# Patient Record
Sex: Female | Born: 1935
Health system: Southern US, Community
[De-identification: ages and names within clinical notes are randomized; demographics above are authoritative.]

## PROBLEM LIST (undated history)

## (undated) DIAGNOSIS — R918 Other nonspecific abnormal finding of lung field: Secondary | ICD-10-CM

## (undated) DIAGNOSIS — E785 Hyperlipidemia, unspecified: Secondary | ICD-10-CM

## (undated) DIAGNOSIS — L089 Local infection of the skin and subcutaneous tissue, unspecified: Secondary | ICD-10-CM

## (undated) DIAGNOSIS — M1990 Unspecified osteoarthritis, unspecified site: Secondary | ICD-10-CM

## (undated) DIAGNOSIS — G8929 Other chronic pain: Secondary | ICD-10-CM

## (undated) DIAGNOSIS — J449 Chronic obstructive pulmonary disease, unspecified: Secondary | ICD-10-CM

## (undated) DIAGNOSIS — K449 Diaphragmatic hernia without obstruction or gangrene: Secondary | ICD-10-CM

## (undated) DIAGNOSIS — L03019 Cellulitis of unspecified finger: Secondary | ICD-10-CM

## (undated) DIAGNOSIS — Z9989 Dependence on other enabling machines and devices: Secondary | ICD-10-CM

## (undated) DIAGNOSIS — F17200 Nicotine dependence, unspecified, uncomplicated: Secondary | ICD-10-CM

## (undated) DIAGNOSIS — I2510 Atherosclerotic heart disease of native coronary artery without angina pectoris: Secondary | ICD-10-CM

## (undated) DIAGNOSIS — B029 Zoster without complications: Secondary | ICD-10-CM

## (undated) DIAGNOSIS — IMO0002 Reserved for concepts with insufficient information to code with codable children: Secondary | ICD-10-CM

## (undated) DIAGNOSIS — D649 Anemia, unspecified: Secondary | ICD-10-CM

## (undated) DIAGNOSIS — R413 Other amnesia: Secondary | ICD-10-CM

## (undated) DIAGNOSIS — M549 Dorsalgia, unspecified: Secondary | ICD-10-CM

## (undated) DIAGNOSIS — K219 Gastro-esophageal reflux disease without esophagitis: Secondary | ICD-10-CM

## (undated) HISTORY — DX: Cellulitis of unspecified finger: L03.019

## (undated) HISTORY — DX: Hyperlipidemia, unspecified: E78.5

## (undated) HISTORY — DX: Unspecified osteoarthritis, unspecified site: M19.90

## (undated) HISTORY — PX: ABDOMINAL HYSTERECTOMY: SHX81

## (undated) HISTORY — DX: Zoster without complications: B02.9

## (undated) HISTORY — DX: Other nonspecific abnormal finding of lung field: R91.8

## (undated) HISTORY — DX: Anemia, unspecified: D64.9

## (undated) HISTORY — DX: Nicotine dependence, unspecified, uncomplicated: F17.200

## (undated) HISTORY — PX: EYE SURGERY: SHX253

## (undated) HISTORY — DX: Diaphragmatic hernia without obstruction or gangrene: K44.9

## (undated) HISTORY — DX: Dorsalgia, unspecified: M54.9

## (undated) HISTORY — PX: MASTECTOMY: SHX3

## (undated) HISTORY — PX: BREAST SURGERY: SHX581

## (undated) HISTORY — DX: Other chronic pain: G89.29

## (undated) HISTORY — PX: COMBINED HYSTERECTOMY ABDOMINAL W/ A&P REPAIR / OOPHORECTOMY: SUR292

## (undated) HISTORY — DX: Gastro-esophageal reflux disease without esophagitis: K21.9

## (undated) HISTORY — PX: CHOLECYSTECTOMY: SHX55

## (undated) HISTORY — DX: Reserved for concepts with insufficient information to code with codable children: IMO0002

## (undated) HISTORY — DX: Local infection of the skin and subcutaneous tissue, unspecified: L08.9

## (undated) HISTORY — DX: Chronic obstructive pulmonary disease, unspecified: J44.9

---

## 1999-01-30 LAB — HM COLONOSCOPY: HM Colonoscopy: ABNORMAL

## 2000-05-11 ENCOUNTER — Encounter: Admission: RE | Admit: 2000-05-11 | Discharge: 2000-05-11 | Payer: Self-pay | Admitting: Oncology

## 2000-05-13 ENCOUNTER — Encounter: Payer: Self-pay | Admitting: General Surgery

## 2000-05-14 ENCOUNTER — Ambulatory Visit (HOSPITAL_COMMUNITY): Admission: RE | Admit: 2000-05-14 | Discharge: 2000-05-14 | Payer: Self-pay | Admitting: General Surgery

## 2000-07-30 ENCOUNTER — Ambulatory Visit (HOSPITAL_COMMUNITY): Admission: RE | Admit: 2000-07-30 | Discharge: 2000-07-30 | Payer: Self-pay | Admitting: Family Medicine

## 2000-07-30 ENCOUNTER — Encounter: Payer: Self-pay | Admitting: Family Medicine

## 2001-03-22 ENCOUNTER — Encounter: Payer: Self-pay | Admitting: Family Medicine

## 2001-03-22 ENCOUNTER — Ambulatory Visit (HOSPITAL_COMMUNITY): Admission: RE | Admit: 2001-03-22 | Discharge: 2001-03-22 | Payer: Self-pay | Admitting: Family Medicine

## 2001-05-10 ENCOUNTER — Encounter (HOSPITAL_COMMUNITY): Admission: RE | Admit: 2001-05-10 | Discharge: 2001-06-09 | Payer: Self-pay | Admitting: Oncology

## 2001-05-10 ENCOUNTER — Encounter: Admission: RE | Admit: 2001-05-10 | Discharge: 2001-05-10 | Payer: Self-pay | Admitting: Oncology

## 2001-11-22 ENCOUNTER — Ambulatory Visit (HOSPITAL_COMMUNITY): Admission: RE | Admit: 2001-11-22 | Discharge: 2001-11-22 | Payer: Self-pay | Admitting: Family Medicine

## 2001-11-22 ENCOUNTER — Encounter: Payer: Self-pay | Admitting: Family Medicine

## 2002-03-23 ENCOUNTER — Encounter: Payer: Self-pay | Admitting: Family Medicine

## 2002-03-23 ENCOUNTER — Ambulatory Visit (HOSPITAL_COMMUNITY): Admission: RE | Admit: 2002-03-23 | Discharge: 2002-03-23 | Payer: Self-pay | Admitting: Family Medicine

## 2002-05-10 ENCOUNTER — Encounter: Admission: RE | Admit: 2002-05-10 | Discharge: 2002-05-10 | Payer: Self-pay | Admitting: Oncology

## 2002-05-10 ENCOUNTER — Encounter (HOSPITAL_COMMUNITY): Admission: RE | Admit: 2002-05-10 | Discharge: 2002-06-09 | Payer: Self-pay | Admitting: Oncology

## 2002-08-28 ENCOUNTER — Ambulatory Visit: Admission: RE | Admit: 2002-08-28 | Discharge: 2002-08-28 | Payer: Self-pay | Admitting: Orthopedic Surgery

## 2002-08-28 ENCOUNTER — Encounter: Payer: Self-pay | Admitting: Orthopedic Surgery

## 2002-10-04 ENCOUNTER — Encounter: Payer: Self-pay | Admitting: Family Medicine

## 2002-10-04 ENCOUNTER — Ambulatory Visit (HOSPITAL_COMMUNITY): Admission: RE | Admit: 2002-10-04 | Discharge: 2002-10-04 | Payer: Self-pay | Admitting: Family Medicine

## 2002-10-19 ENCOUNTER — Emergency Department (HOSPITAL_COMMUNITY): Admission: EM | Admit: 2002-10-19 | Discharge: 2002-10-19 | Payer: Self-pay | Admitting: Emergency Medicine

## 2003-03-26 ENCOUNTER — Ambulatory Visit (HOSPITAL_COMMUNITY): Admission: RE | Admit: 2003-03-26 | Discharge: 2003-03-26 | Payer: Self-pay | Admitting: Family Medicine

## 2003-05-09 ENCOUNTER — Encounter: Admission: RE | Admit: 2003-05-09 | Discharge: 2003-05-09 | Payer: Self-pay | Admitting: Oncology

## 2003-05-09 ENCOUNTER — Encounter (HOSPITAL_COMMUNITY): Admission: RE | Admit: 2003-05-09 | Discharge: 2003-06-08 | Payer: Self-pay | Admitting: Oncology

## 2003-09-25 ENCOUNTER — Ambulatory Visit (HOSPITAL_COMMUNITY): Admission: RE | Admit: 2003-09-25 | Discharge: 2003-09-25 | Payer: Self-pay | Admitting: Family Medicine

## 2003-11-02 ENCOUNTER — Emergency Department (HOSPITAL_COMMUNITY): Admission: EM | Admit: 2003-11-02 | Discharge: 2003-11-02 | Payer: Self-pay | Admitting: Emergency Medicine

## 2003-11-21 ENCOUNTER — Ambulatory Visit: Payer: Self-pay | Admitting: Orthopedic Surgery

## 2004-02-28 ENCOUNTER — Ambulatory Visit: Payer: Self-pay | Admitting: Family Medicine

## 2004-03-03 ENCOUNTER — Ambulatory Visit: Payer: Self-pay | Admitting: Family Medicine

## 2004-03-24 ENCOUNTER — Ambulatory Visit: Payer: Self-pay | Admitting: Orthopedic Surgery

## 2004-03-24 ENCOUNTER — Ambulatory Visit: Payer: Self-pay | Admitting: Family Medicine

## 2004-06-12 ENCOUNTER — Ambulatory Visit: Payer: Self-pay | Admitting: Family Medicine

## 2004-06-12 ENCOUNTER — Ambulatory Visit (HOSPITAL_COMMUNITY): Admission: RE | Admit: 2004-06-12 | Discharge: 2004-06-12 | Payer: Self-pay | Admitting: Family Medicine

## 2004-06-24 ENCOUNTER — Ambulatory Visit (HOSPITAL_COMMUNITY): Admission: RE | Admit: 2004-06-24 | Discharge: 2004-06-24 | Payer: Self-pay | Admitting: Family Medicine

## 2004-07-02 ENCOUNTER — Ambulatory Visit: Payer: Self-pay | Admitting: Orthopedic Surgery

## 2004-07-11 ENCOUNTER — Ambulatory Visit: Payer: Self-pay | Admitting: Family Medicine

## 2004-09-19 ENCOUNTER — Ambulatory Visit: Payer: Self-pay | Admitting: Family Medicine

## 2004-09-24 ENCOUNTER — Encounter (HOSPITAL_COMMUNITY): Admission: RE | Admit: 2004-09-24 | Discharge: 2004-10-11 | Payer: Self-pay | Admitting: Oncology

## 2004-09-24 ENCOUNTER — Ambulatory Visit (HOSPITAL_COMMUNITY): Payer: Self-pay | Admitting: Oncology

## 2004-09-24 ENCOUNTER — Encounter: Admission: RE | Admit: 2004-09-24 | Discharge: 2004-10-11 | Payer: Self-pay | Admitting: Oncology

## 2004-09-25 ENCOUNTER — Ambulatory Visit (HOSPITAL_COMMUNITY): Admission: RE | Admit: 2004-09-25 | Discharge: 2004-09-25 | Payer: Self-pay | Admitting: Family Medicine

## 2004-10-02 ENCOUNTER — Ambulatory Visit: Payer: Self-pay | Admitting: Orthopedic Surgery

## 2004-10-23 ENCOUNTER — Ambulatory Visit: Payer: Self-pay | Admitting: Family Medicine

## 2005-01-01 ENCOUNTER — Ambulatory Visit: Payer: Self-pay | Admitting: Orthopedic Surgery

## 2005-01-07 ENCOUNTER — Encounter: Payer: Self-pay | Admitting: Emergency Medicine

## 2005-01-07 ENCOUNTER — Ambulatory Visit: Payer: Self-pay | Admitting: Cardiology

## 2005-01-07 ENCOUNTER — Inpatient Hospital Stay (HOSPITAL_COMMUNITY): Admission: EM | Admit: 2005-01-07 | Discharge: 2005-01-09 | Payer: Self-pay | Admitting: Emergency Medicine

## 2005-01-09 ENCOUNTER — Ambulatory Visit: Payer: Self-pay | Admitting: Cardiology

## 2005-01-09 ENCOUNTER — Encounter: Payer: Self-pay | Admitting: Cardiology

## 2005-01-13 ENCOUNTER — Ambulatory Visit: Payer: Self-pay | Admitting: Family Medicine

## 2005-02-12 ENCOUNTER — Ambulatory Visit: Payer: Self-pay | Admitting: Orthopedic Surgery

## 2005-02-23 ENCOUNTER — Ambulatory Visit: Payer: Self-pay | Admitting: Family Medicine

## 2005-06-10 ENCOUNTER — Ambulatory Visit: Payer: Self-pay | Admitting: Family Medicine

## 2005-06-26 ENCOUNTER — Ambulatory Visit (HOSPITAL_COMMUNITY): Admission: RE | Admit: 2005-06-26 | Discharge: 2005-06-26 | Payer: Self-pay | Admitting: Family Medicine

## 2005-07-29 ENCOUNTER — Ambulatory Visit (HOSPITAL_COMMUNITY): Admission: RE | Admit: 2005-07-29 | Discharge: 2005-07-29 | Payer: Self-pay | Admitting: Family Medicine

## 2005-08-04 ENCOUNTER — Encounter: Admission: RE | Admit: 2005-08-04 | Discharge: 2005-08-04 | Payer: Self-pay | Admitting: Family Medicine

## 2005-08-04 ENCOUNTER — Encounter (INDEPENDENT_AMBULATORY_CARE_PROVIDER_SITE_OTHER): Payer: Self-pay | Admitting: Specialist

## 2005-08-04 ENCOUNTER — Encounter (INDEPENDENT_AMBULATORY_CARE_PROVIDER_SITE_OTHER): Payer: Self-pay | Admitting: Diagnostic Radiology

## 2005-08-18 ENCOUNTER — Ambulatory Visit (HOSPITAL_COMMUNITY): Admission: RE | Admit: 2005-08-18 | Discharge: 2005-08-18 | Payer: Self-pay | Admitting: Family Medicine

## 2005-09-02 ENCOUNTER — Ambulatory Visit: Payer: Self-pay | Admitting: Family Medicine

## 2005-09-09 ENCOUNTER — Inpatient Hospital Stay (HOSPITAL_COMMUNITY): Admission: RE | Admit: 2005-09-09 | Discharge: 2005-09-10 | Payer: Self-pay | Admitting: General Surgery

## 2005-09-09 ENCOUNTER — Encounter (INDEPENDENT_AMBULATORY_CARE_PROVIDER_SITE_OTHER): Payer: Self-pay | Admitting: Specialist

## 2005-09-23 ENCOUNTER — Encounter (HOSPITAL_COMMUNITY): Admission: RE | Admit: 2005-09-23 | Discharge: 2005-10-09 | Payer: Self-pay | Admitting: Oncology

## 2005-09-23 ENCOUNTER — Encounter: Admission: RE | Admit: 2005-09-23 | Discharge: 2005-10-09 | Payer: Self-pay | Admitting: Oncology

## 2005-09-23 ENCOUNTER — Ambulatory Visit (HOSPITAL_COMMUNITY): Payer: Self-pay | Admitting: Oncology

## 2005-10-12 ENCOUNTER — Encounter: Payer: Self-pay | Admitting: Family Medicine

## 2005-10-12 LAB — CONVERTED CEMR LAB: Pap Smear: NORMAL

## 2005-10-14 ENCOUNTER — Other Ambulatory Visit: Admission: RE | Admit: 2005-10-14 | Discharge: 2005-10-14 | Payer: Self-pay | Admitting: Family Medicine

## 2005-10-14 ENCOUNTER — Ambulatory Visit: Payer: Self-pay | Admitting: Family Medicine

## 2005-11-23 ENCOUNTER — Encounter: Admission: RE | Admit: 2005-11-23 | Discharge: 2005-11-23 | Payer: Self-pay | Admitting: Oncology

## 2006-01-12 DIAGNOSIS — I2510 Atherosclerotic heart disease of native coronary artery without angina pectoris: Secondary | ICD-10-CM

## 2006-01-12 HISTORY — PX: CARDIAC CATHETERIZATION: SHX172

## 2006-01-12 HISTORY — DX: Atherosclerotic heart disease of native coronary artery without angina pectoris: I25.10

## 2006-01-15 ENCOUNTER — Ambulatory Visit: Payer: Self-pay | Admitting: Family Medicine

## 2006-01-23 ENCOUNTER — Encounter: Payer: Self-pay | Admitting: Orthopedic Surgery

## 2006-01-27 ENCOUNTER — Ambulatory Visit: Payer: Self-pay | Admitting: Family Medicine

## 2006-01-27 ENCOUNTER — Ambulatory Visit (HOSPITAL_COMMUNITY): Admission: RE | Admit: 2006-01-27 | Discharge: 2006-01-27 | Payer: Self-pay | Admitting: Family Medicine

## 2006-01-28 ENCOUNTER — Encounter: Payer: Self-pay | Admitting: Family Medicine

## 2006-01-28 LAB — CONVERTED CEMR LAB
Ketones, ur: NEGATIVE mg/dL
Leukocytes, UA: NEGATIVE
Protein, ur: NEGATIVE mg/dL
Specific Gravity, Urine: 1.031 (ref 1.005–1.03)
Urobilinogen, UA: 0.2 (ref 0.0–1.0)
pH: 5.5 (ref 5.0–8.0)

## 2006-02-02 ENCOUNTER — Ambulatory Visit: Payer: Self-pay | Admitting: Cardiovascular Disease

## 2006-02-05 ENCOUNTER — Ambulatory Visit (HOSPITAL_COMMUNITY): Admission: RE | Admit: 2006-02-05 | Discharge: 2006-02-05 | Payer: Self-pay | Admitting: Cardiovascular Disease

## 2006-02-12 ENCOUNTER — Ambulatory Visit (HOSPITAL_COMMUNITY): Admission: RE | Admit: 2006-02-12 | Discharge: 2006-02-12 | Payer: Self-pay | Admitting: Pulmonary Disease

## 2006-03-16 ENCOUNTER — Ambulatory Visit (HOSPITAL_COMMUNITY): Admission: RE | Admit: 2006-03-16 | Discharge: 2006-03-16 | Payer: Self-pay | Admitting: Family Medicine

## 2006-03-16 ENCOUNTER — Ambulatory Visit: Payer: Self-pay | Admitting: Family Medicine

## 2006-03-16 LAB — CONVERTED CEMR LAB
ALT: 16 units/L (ref 0–35)
BUN: 20 mg/dL (ref 6–23)
Basophils Absolute: 0 10*3/uL (ref 0.0–0.1)
Basophils Relative: 1 % (ref 0–1)
Bilirubin, Direct: 0.1 mg/dL (ref 0.0–0.3)
Chloride: 106 meq/L (ref 96–112)
Eosinophils Absolute: 0.2 10*3/uL (ref 0.0–0.7)
INR: 1 (ref 0.0–1.5)
Indirect Bilirubin: 0.3 mg/dL (ref 0.0–0.9)
Lymphocytes Relative: 43 % (ref 12–46)
MCV: 98.1 fL (ref 78.0–100.0)
Monocytes Absolute: 0.4 10*3/uL (ref 0.2–0.7)
Monocytes Relative: 10 % (ref 3–11)
Neutrophils Relative %: 41 % — ABNORMAL LOW (ref 43–77)
Potassium: 4.1 meq/L (ref 3.5–5.3)
RDW: 15.5 % — ABNORMAL HIGH (ref 11.5–14.0)
Sodium: 140 meq/L (ref 135–145)
Total Protein: 6.4 g/dL (ref 6.0–8.3)

## 2006-04-13 HISTORY — PX: TOTAL KNEE ARTHROPLASTY: SHX125

## 2006-06-21 ENCOUNTER — Ambulatory Visit: Payer: Self-pay | Admitting: Family Medicine

## 2006-06-21 LAB — CONVERTED CEMR LAB
Basophils Relative: 0 % (ref 0–1)
Chloride: 106 meq/L (ref 96–112)
Creatinine, Ser: 0.77 mg/dL (ref 0.40–1.20)
Eosinophils Absolute: 0.2 10*3/uL (ref 0.0–0.7)
Glucose, Bld: 85 mg/dL (ref 70–99)
Lymphs Abs: 2.2 10*3/uL (ref 0.7–3.3)
Monocytes Absolute: 0.5 10*3/uL (ref 0.2–0.7)
Monocytes Relative: 10 % (ref 3–11)
Sodium: 144 meq/L (ref 135–145)
WBC: 5.1 10*3/uL (ref 4.0–10.5)

## 2006-06-23 ENCOUNTER — Encounter: Payer: Self-pay | Admitting: Family Medicine

## 2006-06-23 ENCOUNTER — Encounter (HOSPITAL_COMMUNITY): Admission: RE | Admit: 2006-06-23 | Discharge: 2006-07-23 | Payer: Self-pay | Admitting: Family Medicine

## 2006-07-01 ENCOUNTER — Ambulatory Visit: Payer: Self-pay | Admitting: Orthopedic Surgery

## 2006-07-05 ENCOUNTER — Ambulatory Visit: Payer: Self-pay | Admitting: Family Medicine

## 2006-07-12 ENCOUNTER — Emergency Department (HOSPITAL_COMMUNITY): Admission: EM | Admit: 2006-07-12 | Discharge: 2006-07-12 | Payer: Self-pay | Admitting: Emergency Medicine

## 2006-07-15 ENCOUNTER — Ambulatory Visit: Payer: Self-pay | Admitting: Orthopedic Surgery

## 2006-07-21 ENCOUNTER — Ambulatory Visit: Payer: Self-pay | Admitting: Family Medicine

## 2006-07-26 ENCOUNTER — Ambulatory Visit: Payer: Self-pay | Admitting: Orthopedic Surgery

## 2006-07-27 ENCOUNTER — Encounter (HOSPITAL_COMMUNITY): Admission: RE | Admit: 2006-07-27 | Discharge: 2006-08-26 | Payer: Self-pay | Admitting: Family Medicine

## 2006-08-02 ENCOUNTER — Ambulatory Visit: Payer: Self-pay | Admitting: Family Medicine

## 2006-08-16 ENCOUNTER — Ambulatory Visit: Payer: Self-pay | Admitting: Orthopedic Surgery

## 2006-08-19 ENCOUNTER — Ambulatory Visit: Payer: Self-pay | Admitting: Family Medicine

## 2006-09-01 ENCOUNTER — Ambulatory Visit: Payer: Self-pay | Admitting: Family Medicine

## 2006-09-28 ENCOUNTER — Ambulatory Visit: Payer: Self-pay | Admitting: Family Medicine

## 2006-09-28 LAB — CONVERTED CEMR LAB
Albumin: 4 g/dL (ref 3.5–5.2)
Basophils Absolute: 0 10*3/uL (ref 0.0–0.1)
CO2: 27 meq/L (ref 19–32)
Chloride: 98 meq/L (ref 96–112)
Cholesterol: 194 mg/dL (ref 0–200)
Creatinine, Ser: 0.83 mg/dL (ref 0.40–1.20)
Hemoglobin: 14.1 g/dL (ref 12.0–15.0)
Lymphocytes Relative: 34 % (ref 12–46)
Lymphs Abs: 1.6 10*3/uL (ref 0.7–3.3)
MCHC: 32.1 g/dL (ref 30.0–36.0)
MCV: 93.6 fL (ref 78.0–100.0)
Monocytes Absolute: 0.5 10*3/uL (ref 0.2–0.7)
Monocytes Relative: 11 % (ref 3–11)
Neutro Abs: 2.6 10*3/uL (ref 1.7–7.7)
Neutrophils Relative %: 53 % (ref 43–77)
Platelets: 277 10*3/uL (ref 150–400)
Potassium: 3.7 meq/L (ref 3.5–5.3)
RBC: 4.69 M/uL (ref 3.87–5.11)
Sodium: 139 meq/L (ref 135–145)
Triglycerides: 94 mg/dL (ref <150)
VLDL: 19 mg/dL (ref 0–40)

## 2006-10-26 ENCOUNTER — Ambulatory Visit: Payer: Self-pay | Admitting: Family Medicine

## 2006-11-03 ENCOUNTER — Ambulatory Visit: Payer: Self-pay | Admitting: Cardiology

## 2006-11-24 ENCOUNTER — Ambulatory Visit: Payer: Self-pay | Admitting: Cardiovascular Disease

## 2006-11-25 ENCOUNTER — Ambulatory Visit: Payer: Self-pay | Admitting: Cardiology

## 2006-11-26 ENCOUNTER — Encounter (HOSPITAL_COMMUNITY): Admission: RE | Admit: 2006-11-26 | Discharge: 2006-12-26 | Payer: Self-pay | Admitting: Cardiovascular Disease

## 2006-12-14 ENCOUNTER — Ambulatory Visit: Payer: Self-pay | Admitting: Cardiovascular Disease

## 2006-12-21 ENCOUNTER — Ambulatory Visit: Payer: Self-pay | Admitting: Family Medicine

## 2006-12-22 ENCOUNTER — Emergency Department (HOSPITAL_COMMUNITY): Admission: EM | Admit: 2006-12-22 | Discharge: 2006-12-22 | Payer: Self-pay | Admitting: Emergency Medicine

## 2007-01-03 ENCOUNTER — Ambulatory Visit: Payer: Self-pay | Admitting: Family Medicine

## 2007-01-19 ENCOUNTER — Telehealth: Payer: Self-pay | Admitting: Orthopedic Surgery

## 2007-01-19 ENCOUNTER — Ambulatory Visit: Payer: Self-pay | Admitting: Orthopedic Surgery

## 2007-01-19 DIAGNOSIS — M1710 Unilateral primary osteoarthritis, unspecified knee: Secondary | ICD-10-CM | POA: Insufficient documentation

## 2007-01-25 ENCOUNTER — Encounter: Payer: Self-pay | Admitting: Family Medicine

## 2007-01-25 DIAGNOSIS — M1990 Unspecified osteoarthritis, unspecified site: Secondary | ICD-10-CM

## 2007-01-25 DIAGNOSIS — F17200 Nicotine dependence, unspecified, uncomplicated: Secondary | ICD-10-CM | POA: Insufficient documentation

## 2007-01-25 DIAGNOSIS — K449 Diaphragmatic hernia without obstruction or gangrene: Secondary | ICD-10-CM | POA: Insufficient documentation

## 2007-01-25 DIAGNOSIS — Z853 Personal history of malignant neoplasm of breast: Secondary | ICD-10-CM | POA: Insufficient documentation

## 2007-01-25 DIAGNOSIS — J4489 Other specified chronic obstructive pulmonary disease: Secondary | ICD-10-CM | POA: Insufficient documentation

## 2007-01-25 DIAGNOSIS — Z87898 Personal history of other specified conditions: Secondary | ICD-10-CM | POA: Insufficient documentation

## 2007-01-25 DIAGNOSIS — I2510 Atherosclerotic heart disease of native coronary artery without angina pectoris: Secondary | ICD-10-CM

## 2007-01-25 DIAGNOSIS — J449 Chronic obstructive pulmonary disease, unspecified: Secondary | ICD-10-CM | POA: Insufficient documentation

## 2007-01-25 LAB — CONVERTED CEMR LAB: WBC Urine, dipstick: NEGATIVE

## 2007-02-02 ENCOUNTER — Ambulatory Visit: Payer: Self-pay | Admitting: Family Medicine

## 2007-02-07 ENCOUNTER — Ambulatory Visit: Payer: Self-pay | Admitting: Cardiovascular Disease

## 2007-03-21 ENCOUNTER — Ambulatory Visit (HOSPITAL_COMMUNITY): Admission: RE | Admit: 2007-03-21 | Discharge: 2007-03-21 | Payer: Self-pay | Admitting: Family Medicine

## 2007-03-21 ENCOUNTER — Ambulatory Visit: Payer: Self-pay | Admitting: Family Medicine

## 2007-03-24 ENCOUNTER — Ambulatory Visit (HOSPITAL_COMMUNITY): Admission: RE | Admit: 2007-03-24 | Discharge: 2007-03-24 | Payer: Self-pay | Admitting: Family Medicine

## 2007-03-24 ENCOUNTER — Ambulatory Visit: Payer: Self-pay | Admitting: Orthopedic Surgery

## 2007-04-26 ENCOUNTER — Ambulatory Visit: Payer: Self-pay | Admitting: Family Medicine

## 2007-05-02 ENCOUNTER — Encounter: Payer: Self-pay | Admitting: Family Medicine

## 2007-05-20 ENCOUNTER — Ambulatory Visit: Payer: Self-pay | Admitting: Family Medicine

## 2007-06-05 ENCOUNTER — Encounter: Payer: Self-pay | Admitting: Family Medicine

## 2007-06-07 ENCOUNTER — Encounter: Payer: Self-pay | Admitting: Orthopedic Surgery

## 2007-06-09 ENCOUNTER — Ambulatory Visit: Payer: Self-pay | Admitting: Orthopedic Surgery

## 2007-06-09 DIAGNOSIS — IMO0002 Reserved for concepts with insufficient information to code with codable children: Secondary | ICD-10-CM | POA: Insufficient documentation

## 2007-06-09 DIAGNOSIS — M4800 Spinal stenosis, site unspecified: Secondary | ICD-10-CM | POA: Insufficient documentation

## 2007-06-09 DIAGNOSIS — M25569 Pain in unspecified knee: Secondary | ICD-10-CM

## 2007-07-27 ENCOUNTER — Ambulatory Visit: Payer: Self-pay | Admitting: Orthopedic Surgery

## 2007-08-02 ENCOUNTER — Ambulatory Visit: Payer: Self-pay | Admitting: Family Medicine

## 2007-08-02 LAB — CONVERTED CEMR LAB
BUN: 13 mg/dL (ref 6–23)
Basophils Absolute: 0 10*3/uL (ref 0.0–0.1)
Basophils Relative: 0 % (ref 0–1)
CO2: 28 meq/L (ref 19–32)
Calcium: 8.8 mg/dL (ref 8.4–10.5)
Chloride: 104 meq/L (ref 96–112)
Cholesterol: 170 mg/dL (ref 0–200)
Creatinine, Ser: 0.63 mg/dL (ref 0.40–1.20)
Eosinophils Absolute: 0.1 10*3/uL (ref 0.0–0.7)
Eosinophils Relative: 2 % (ref 0–5)
HCT: 43.2 % (ref 36.0–46.0)
Hemoglobin: 13.4 g/dL (ref 12.0–15.0)
Lymphocytes Relative: 36 % (ref 12–46)
Monocytes Absolute: 0.5 10*3/uL (ref 0.1–1.0)
Monocytes Relative: 11 % (ref 3–12)
Neutro Abs: 2.1 10*3/uL (ref 1.7–7.7)
Neutrophils Relative %: 50 % (ref 43–77)
Potassium: 4.4 meq/L (ref 3.5–5.3)
RBC: 4.46 M/uL (ref 3.87–5.11)
RDW: 15.4 % (ref 11.5–15.5)
Sodium: 143 meq/L (ref 135–145)
TSH: 0.97 microintl units/mL (ref 0.350–4.50)
VLDL: 23 mg/dL (ref 0–40)
WBC: 4.2 10*3/uL (ref 4.0–10.5)

## 2007-08-08 ENCOUNTER — Ambulatory Visit (HOSPITAL_COMMUNITY): Admission: RE | Admit: 2007-08-08 | Discharge: 2007-08-08 | Payer: Self-pay | Admitting: Family Medicine

## 2007-08-10 ENCOUNTER — Telehealth: Payer: Self-pay | Admitting: Family Medicine

## 2007-09-05 ENCOUNTER — Ambulatory Visit: Payer: Self-pay | Admitting: Orthopedic Surgery

## 2007-10-11 ENCOUNTER — Ambulatory Visit: Payer: Self-pay | Admitting: Family Medicine

## 2007-10-25 ENCOUNTER — Telehealth: Payer: Self-pay | Admitting: Orthopedic Surgery

## 2007-10-31 ENCOUNTER — Ambulatory Visit: Payer: Self-pay | Admitting: Family Medicine

## 2007-11-10 ENCOUNTER — Ambulatory Visit: Payer: Self-pay | Admitting: Family Medicine

## 2007-12-28 ENCOUNTER — Ambulatory Visit: Payer: Self-pay | Admitting: Orthopedic Surgery

## 2008-01-10 ENCOUNTER — Encounter (INDEPENDENT_AMBULATORY_CARE_PROVIDER_SITE_OTHER): Payer: Self-pay | Admitting: *Deleted

## 2008-02-02 ENCOUNTER — Encounter: Payer: Self-pay | Admitting: Family Medicine

## 2008-02-22 ENCOUNTER — Ambulatory Visit: Payer: Self-pay | Admitting: Orthopedic Surgery

## 2008-02-23 ENCOUNTER — Ambulatory Visit: Payer: Self-pay | Admitting: Family Medicine

## 2008-02-24 ENCOUNTER — Ambulatory Visit (HOSPITAL_COMMUNITY): Admission: RE | Admit: 2008-02-24 | Discharge: 2008-02-24 | Payer: Self-pay | Admitting: Family Medicine

## 2008-02-24 ENCOUNTER — Encounter: Payer: Self-pay | Admitting: Family Medicine

## 2008-02-24 LAB — CONVERTED CEMR LAB
ALT: 329 units/L — ABNORMAL HIGH (ref 0–35)
Basophils Absolute: 0 10*3/uL (ref 0.0–0.1)
Basophils Relative: 0 % (ref 0–1)
Bilirubin, Direct: 0.1 mg/dL (ref 0.0–0.3)
Eosinophils Absolute: 0.1 10*3/uL (ref 0.0–0.7)
Eosinophils Relative: 2 % (ref 0–5)
HDL: 88 mg/dL (ref >39)
Hemoglobin: 12.7 g/dL (ref 12.0–15.0)
Indirect Bilirubin: 0.4 mg/dL (ref 0.0–0.9)
Lymphs Abs: 1.2 10*3/uL (ref 0.7–4.0)
MCV: 94.7 fL (ref 78.0–100.0)
Monocytes Relative: 8 % (ref 3–12)
RBC: 4.13 M/uL (ref 3.87–5.11)
TSH: 1.152 microintl units/mL (ref 0.350–4.50)
Total Bilirubin: 0.5 mg/dL (ref 0.3–1.2)
Total CHOL/HDL Ratio: 1.9

## 2008-02-28 LAB — CONVERTED CEMR LAB
Hep A IgM: NEGATIVE
Hep B C IgM: NEGATIVE
Hepatitis B Surface Ag: NEGATIVE

## 2008-02-29 ENCOUNTER — Ambulatory Visit (HOSPITAL_COMMUNITY): Admission: RE | Admit: 2008-02-29 | Discharge: 2008-02-29 | Payer: Self-pay | Admitting: Family Medicine

## 2008-03-05 ENCOUNTER — Telehealth: Payer: Self-pay | Admitting: Family Medicine

## 2008-03-22 ENCOUNTER — Encounter: Payer: Self-pay | Admitting: Family Medicine

## 2008-03-29 ENCOUNTER — Encounter: Payer: Self-pay | Admitting: Family Medicine

## 2008-03-30 ENCOUNTER — Encounter: Payer: Self-pay | Admitting: Family Medicine

## 2008-04-02 ENCOUNTER — Encounter: Payer: Self-pay | Admitting: Family Medicine

## 2008-04-06 ENCOUNTER — Ambulatory Visit: Payer: Self-pay | Admitting: Family Medicine

## 2008-05-02 ENCOUNTER — Ambulatory Visit: Payer: Self-pay | Admitting: Family Medicine

## 2008-05-02 LAB — CONVERTED CEMR LAB
Blood in Urine, dipstick: NEGATIVE
Nitrite: NEGATIVE
Protein, U semiquant: NEGATIVE
Specific Gravity, Urine: 1.01
WBC Urine, dipstick: NEGATIVE
pH: 6

## 2008-07-03 ENCOUNTER — Ambulatory Visit: Payer: Self-pay | Admitting: Family Medicine

## 2008-07-03 DIAGNOSIS — K759 Inflammatory liver disease, unspecified: Secondary | ICD-10-CM | POA: Insufficient documentation

## 2008-07-03 LAB — CONVERTED CEMR LAB: Troponin I: 0.01 ng/mL (ref <0.06)

## 2008-07-04 ENCOUNTER — Encounter: Payer: Self-pay | Admitting: Family Medicine

## 2008-07-04 LAB — CONVERTED CEMR LAB
ALT: 18 units/L (ref 0–35)
AST: 23 units/L (ref 0–37)
Bilirubin, Direct: 0.1 mg/dL (ref 0.0–0.3)
Indirect Bilirubin: 0.3 mg/dL (ref 0.0–0.9)

## 2008-07-05 ENCOUNTER — Ambulatory Visit: Payer: Self-pay | Admitting: Orthopedic Surgery

## 2008-07-05 ENCOUNTER — Telehealth: Payer: Self-pay | Admitting: Family Medicine

## 2008-08-23 ENCOUNTER — Ambulatory Visit: Payer: Self-pay | Admitting: Orthopedic Surgery

## 2008-08-23 DIAGNOSIS — M19079 Primary osteoarthritis, unspecified ankle and foot: Secondary | ICD-10-CM | POA: Insufficient documentation

## 2008-09-03 ENCOUNTER — Encounter: Payer: Self-pay | Admitting: Orthopedic Surgery

## 2008-09-03 ENCOUNTER — Telehealth: Payer: Self-pay | Admitting: Family Medicine

## 2008-09-03 ENCOUNTER — Encounter: Payer: Self-pay | Admitting: Family Medicine

## 2008-09-04 ENCOUNTER — Ambulatory Visit: Payer: Self-pay | Admitting: Family Medicine

## 2008-10-18 ENCOUNTER — Ambulatory Visit: Payer: Self-pay | Admitting: Family Medicine

## 2008-11-28 ENCOUNTER — Ambulatory Visit: Payer: Self-pay | Admitting: Orthopedic Surgery

## 2008-12-24 ENCOUNTER — Ambulatory Visit: Payer: Self-pay | Admitting: Family Medicine

## 2008-12-27 ENCOUNTER — Emergency Department (HOSPITAL_COMMUNITY): Admission: EM | Admit: 2008-12-27 | Discharge: 2008-12-27 | Payer: Self-pay | Admitting: Emergency Medicine

## 2008-12-28 ENCOUNTER — Telehealth: Payer: Self-pay | Admitting: Family Medicine

## 2008-12-28 ENCOUNTER — Emergency Department (HOSPITAL_COMMUNITY): Admission: EM | Admit: 2008-12-28 | Discharge: 2008-12-28 | Payer: Self-pay | Admitting: Emergency Medicine

## 2008-12-31 LAB — CONVERTED CEMR LAB
ALT: 10 units/L (ref 0–35)
AST: 15 units/L (ref 0–37)
Albumin: 3.5 g/dL (ref 3.5–5.2)
Bilirubin, Direct: 0.1 mg/dL (ref 0.0–0.3)
Helicobacter Pylori Antibody-IgG: <0.4
Total Protein: 6.4 g/dL (ref 6.0–8.3)

## 2009-01-01 ENCOUNTER — Telehealth: Payer: Self-pay | Admitting: Family Medicine

## 2009-01-01 ENCOUNTER — Ambulatory Visit: Payer: Self-pay | Admitting: Family Medicine

## 2009-01-02 ENCOUNTER — Ambulatory Visit: Payer: Self-pay | Admitting: Cardiology

## 2009-01-02 DIAGNOSIS — K219 Gastro-esophageal reflux disease without esophagitis: Secondary | ICD-10-CM

## 2009-01-07 ENCOUNTER — Encounter: Payer: Self-pay | Admitting: Family Medicine

## 2009-01-10 ENCOUNTER — Telehealth: Payer: Self-pay | Admitting: Family Medicine

## 2009-01-12 DIAGNOSIS — M549 Dorsalgia, unspecified: Secondary | ICD-10-CM

## 2009-01-14 ENCOUNTER — Telehealth: Payer: Self-pay | Admitting: Family Medicine

## 2009-01-15 ENCOUNTER — Ambulatory Visit (HOSPITAL_COMMUNITY): Admission: RE | Admit: 2009-01-15 | Discharge: 2009-01-15 | Payer: Self-pay | Admitting: Family Medicine

## 2009-01-15 ENCOUNTER — Ambulatory Visit (HOSPITAL_COMMUNITY): Admission: RE | Admit: 2009-01-15 | Discharge: 2009-01-15 | Payer: Self-pay | Admitting: Cardiology

## 2009-01-15 ENCOUNTER — Telehealth: Payer: Self-pay | Admitting: Family Medicine

## 2009-01-21 ENCOUNTER — Telehealth: Payer: Self-pay | Admitting: Family Medicine

## 2009-01-31 ENCOUNTER — Ambulatory Visit: Payer: Self-pay | Admitting: Family Medicine

## 2009-01-31 ENCOUNTER — Encounter (INDEPENDENT_AMBULATORY_CARE_PROVIDER_SITE_OTHER): Payer: Self-pay | Admitting: *Deleted

## 2009-02-04 ENCOUNTER — Encounter: Payer: Self-pay | Admitting: Family Medicine

## 2009-02-28 ENCOUNTER — Ambulatory Visit: Payer: Self-pay | Admitting: Orthopedic Surgery

## 2009-03-08 ENCOUNTER — Telehealth: Payer: Self-pay | Admitting: Family Medicine

## 2009-05-15 ENCOUNTER — Ambulatory Visit: Payer: Self-pay | Admitting: Orthopedic Surgery

## 2009-06-03 ENCOUNTER — Encounter: Payer: Self-pay | Admitting: Family Medicine

## 2009-06-04 ENCOUNTER — Ambulatory Visit (HOSPITAL_COMMUNITY): Admission: RE | Admit: 2009-06-04 | Discharge: 2009-06-04 | Payer: Self-pay | Admitting: Pulmonary Disease

## 2009-06-17 ENCOUNTER — Ambulatory Visit: Payer: Self-pay | Admitting: Family Medicine

## 2009-06-17 ENCOUNTER — Encounter: Payer: Self-pay | Admitting: Cardiology

## 2009-06-17 DIAGNOSIS — R5383 Other fatigue: Secondary | ICD-10-CM

## 2009-06-17 DIAGNOSIS — R5381 Other malaise: Secondary | ICD-10-CM

## 2009-06-18 ENCOUNTER — Encounter: Payer: Self-pay | Admitting: Family Medicine

## 2009-06-18 LAB — CONVERTED CEMR LAB
Albumin: 3.7 g/dL (ref 3.5–5.2)
BUN: 20 mg/dL (ref 6–23)
Basophils Absolute: 0 10*3/uL (ref 0.0–0.1)
CO2: 28 meq/L (ref 19–32)
Chloride: 106 meq/L (ref 96–112)
Cholesterol: 154 mg/dL (ref 0–200)
Creatinine, Ser: 0.69 mg/dL (ref 0.40–1.20)
Glucose, Bld: 80 mg/dL (ref 70–99)
HCT: 40.6 % (ref 36.0–46.0)
Hemoglobin: 12.7 g/dL (ref 12.0–15.0)
Lymphs Abs: 1.7 10*3/uL (ref 0.7–4.0)
Monocytes Absolute: 0.4 10*3/uL (ref 0.1–1.0)
Monocytes Relative: 9 % (ref 3–12)
Neutro Abs: 1.8 10*3/uL (ref 1.7–7.7)
TSH: 1.174 microintl units/mL (ref 0.350–4.500)
Triglycerides: 81 mg/dL (ref <150)

## 2009-06-20 ENCOUNTER — Telehealth: Payer: Self-pay | Admitting: Family Medicine

## 2009-07-10 ENCOUNTER — Ambulatory Visit: Payer: Self-pay | Admitting: Orthopedic Surgery

## 2009-07-29 ENCOUNTER — Ambulatory Visit: Payer: Self-pay | Admitting: Family Medicine

## 2009-08-02 ENCOUNTER — Encounter (INDEPENDENT_AMBULATORY_CARE_PROVIDER_SITE_OTHER): Payer: Self-pay | Admitting: *Deleted

## 2009-08-02 LAB — CONVERTED CEMR LAB
Alkaline Phosphatase: 83 units/L
BUN: 20 mg/dL
Bilirubin, Direct: 0.1 mg/dL
CO2: 28 meq/L
Chloride: 106 meq/L
Creatinine, Ser: 0.69 mg/dL
Glucose, Bld: 80 mg/dL
Hemoglobin: 12.7 g/dL
LDL Cholesterol: 74 mg/dL
MCV: 99.5 fL
Platelets: 200 10*3/uL
Potassium: 3.9 meq/L
Total Protein: 6.3 g/dL
Triglycerides: 81 mg/dL
WBC: 4.2 10*3/uL

## 2009-08-05 ENCOUNTER — Ambulatory Visit: Payer: Self-pay | Admitting: Cardiology

## 2009-08-05 DIAGNOSIS — I679 Cerebrovascular disease, unspecified: Secondary | ICD-10-CM

## 2009-08-09 ENCOUNTER — Telehealth: Payer: Self-pay | Admitting: Family Medicine

## 2009-08-21 ENCOUNTER — Encounter: Payer: Self-pay | Admitting: Family Medicine

## 2009-08-28 ENCOUNTER — Ambulatory Visit: Payer: Self-pay | Admitting: Family Medicine

## 2009-09-02 ENCOUNTER — Ambulatory Visit: Payer: Self-pay | Admitting: Orthopedic Surgery

## 2009-09-11 ENCOUNTER — Ambulatory Visit: Payer: Self-pay | Admitting: Orthopedic Surgery

## 2009-09-11 DIAGNOSIS — I8310 Varicose veins of unspecified lower extremity with inflammation: Secondary | ICD-10-CM

## 2009-09-12 ENCOUNTER — Ambulatory Visit: Payer: Self-pay | Admitting: Family Medicine

## 2009-09-12 DIAGNOSIS — R609 Edema, unspecified: Secondary | ICD-10-CM

## 2009-09-12 HISTORY — PX: OTHER SURGICAL HISTORY: SHX169

## 2009-09-17 ENCOUNTER — Encounter (INDEPENDENT_AMBULATORY_CARE_PROVIDER_SITE_OTHER): Payer: Self-pay | Admitting: *Deleted

## 2009-09-23 ENCOUNTER — Encounter: Payer: Self-pay | Admitting: Orthopedic Surgery

## 2009-09-23 ENCOUNTER — Telehealth (INDEPENDENT_AMBULATORY_CARE_PROVIDER_SITE_OTHER): Payer: Self-pay | Admitting: *Deleted

## 2009-09-24 ENCOUNTER — Inpatient Hospital Stay (HOSPITAL_COMMUNITY): Admission: RE | Admit: 2009-09-24 | Discharge: 2009-09-27 | Payer: Self-pay | Admitting: Orthopedic Surgery

## 2009-09-24 ENCOUNTER — Telehealth: Payer: Self-pay | Admitting: Family Medicine

## 2009-09-24 ENCOUNTER — Ambulatory Visit: Payer: Self-pay | Admitting: Orthopedic Surgery

## 2009-09-30 ENCOUNTER — Telehealth: Payer: Self-pay | Admitting: Orthopedic Surgery

## 2009-09-30 ENCOUNTER — Ambulatory Visit: Payer: Self-pay | Admitting: Orthopedic Surgery

## 2009-09-30 DIAGNOSIS — Z96659 Presence of unspecified artificial knee joint: Secondary | ICD-10-CM

## 2009-10-03 ENCOUNTER — Encounter: Payer: Self-pay | Admitting: Orthopedic Surgery

## 2009-10-08 ENCOUNTER — Ambulatory Visit: Payer: Self-pay | Admitting: Orthopedic Surgery

## 2009-10-09 ENCOUNTER — Telehealth (INDEPENDENT_AMBULATORY_CARE_PROVIDER_SITE_OTHER): Payer: Self-pay | Admitting: *Deleted

## 2009-10-10 ENCOUNTER — Telehealth: Payer: Self-pay | Admitting: Orthopedic Surgery

## 2009-10-10 ENCOUNTER — Encounter: Payer: Self-pay | Admitting: Orthopedic Surgery

## 2009-10-15 ENCOUNTER — Ambulatory Visit: Payer: Self-pay | Admitting: Orthopedic Surgery

## 2009-10-23 ENCOUNTER — Telehealth: Payer: Self-pay | Admitting: Orthopedic Surgery

## 2009-10-24 ENCOUNTER — Encounter (HOSPITAL_COMMUNITY)
Admission: RE | Admit: 2009-10-24 | Discharge: 2009-11-23 | Payer: Self-pay | Source: Home / Self Care | Admitting: Orthopedic Surgery

## 2009-10-24 ENCOUNTER — Telehealth: Payer: Self-pay | Admitting: Orthopedic Surgery

## 2009-10-29 ENCOUNTER — Ambulatory Visit: Payer: Self-pay | Admitting: Family Medicine

## 2009-10-29 LAB — CONVERTED CEMR LAB
CO2: 27 meq/L (ref 19–32)
Chloride: 104 meq/L (ref 96–112)
Glucose, Bld: 81 mg/dL (ref 70–99)
Sodium: 140 meq/L (ref 135–145)

## 2009-10-30 ENCOUNTER — Ambulatory Visit: Payer: Self-pay | Admitting: Orthopedic Surgery

## 2009-11-05 ENCOUNTER — Encounter: Payer: Self-pay | Admitting: Orthopedic Surgery

## 2009-11-11 ENCOUNTER — Ambulatory Visit: Payer: Self-pay | Admitting: Family Medicine

## 2009-11-20 ENCOUNTER — Ambulatory Visit: Payer: Self-pay | Admitting: Family Medicine

## 2009-11-22 ENCOUNTER — Ambulatory Visit (HOSPITAL_COMMUNITY): Admission: RE | Admit: 2009-11-22 | Discharge: 2009-11-22 | Payer: Self-pay | Admitting: Orthopedic Surgery

## 2009-11-26 ENCOUNTER — Encounter (HOSPITAL_COMMUNITY)
Admission: RE | Admit: 2009-11-26 | Discharge: 2009-12-26 | Payer: Self-pay | Source: Home / Self Care | Attending: Orthopedic Surgery | Admitting: Orthopedic Surgery

## 2009-11-26 ENCOUNTER — Encounter: Payer: Self-pay | Admitting: Orthopedic Surgery

## 2009-11-27 ENCOUNTER — Ambulatory Visit: Payer: Self-pay | Admitting: Orthopedic Surgery

## 2009-11-29 ENCOUNTER — Telehealth: Payer: Self-pay | Admitting: Orthopedic Surgery

## 2009-11-29 ENCOUNTER — Encounter (INDEPENDENT_AMBULATORY_CARE_PROVIDER_SITE_OTHER): Payer: Self-pay | Admitting: *Deleted

## 2009-12-04 ENCOUNTER — Telehealth: Payer: Self-pay | Admitting: Orthopedic Surgery

## 2009-12-04 ENCOUNTER — Encounter: Payer: Self-pay | Admitting: Orthopedic Surgery

## 2009-12-18 ENCOUNTER — Encounter: Payer: Self-pay | Admitting: Orthopedic Surgery

## 2009-12-23 ENCOUNTER — Encounter (INDEPENDENT_AMBULATORY_CARE_PROVIDER_SITE_OTHER): Payer: Self-pay | Admitting: *Deleted

## 2009-12-23 ENCOUNTER — Telehealth: Payer: Self-pay | Admitting: Orthopedic Surgery

## 2009-12-24 ENCOUNTER — Encounter
Admission: RE | Admit: 2009-12-24 | Discharge: 2009-12-24 | Payer: Self-pay | Source: Home / Self Care | Attending: Orthopedic Surgery | Admitting: Orthopedic Surgery

## 2009-12-24 ENCOUNTER — Encounter: Payer: Self-pay | Admitting: Orthopedic Surgery

## 2009-12-26 ENCOUNTER — Ambulatory Visit: Payer: Self-pay | Admitting: Orthopedic Surgery

## 2009-12-26 ENCOUNTER — Ambulatory Visit: Payer: Self-pay | Admitting: Family Medicine

## 2009-12-27 ENCOUNTER — Encounter: Payer: Self-pay | Admitting: Family Medicine

## 2010-01-28 ENCOUNTER — Ambulatory Visit: Admit: 2010-01-28 | Payer: Self-pay | Admitting: Orthopedic Surgery

## 2010-01-30 ENCOUNTER — Ambulatory Visit
Admission: RE | Admit: 2010-01-30 | Discharge: 2010-01-30 | Payer: Self-pay | Source: Home / Self Care | Attending: Orthopedic Surgery | Admitting: Orthopedic Surgery

## 2010-02-01 ENCOUNTER — Encounter (HOSPITAL_COMMUNITY): Payer: Self-pay | Admitting: Oncology

## 2010-02-02 ENCOUNTER — Encounter: Payer: Self-pay | Admitting: Family Medicine

## 2010-02-02 ENCOUNTER — Encounter: Payer: Self-pay | Admitting: Cardiovascular Disease

## 2010-02-02 ENCOUNTER — Encounter: Payer: Self-pay | Admitting: Pulmonary Disease

## 2010-02-02 ENCOUNTER — Encounter (HOSPITAL_COMMUNITY): Payer: Self-pay | Admitting: Oncology

## 2010-02-10 ENCOUNTER — Ambulatory Visit
Admission: RE | Admit: 2010-02-10 | Discharge: 2010-02-10 | Payer: Self-pay | Source: Home / Self Care | Attending: Family Medicine | Admitting: Family Medicine

## 2010-02-11 NOTE — Letter (Signed)
Summary: Generic Letter  [facility]  [address]  [place], [place] [address]   Phone: [phone]  Fax: [phone]    10/03/2009   Reference: MARDA BREIDENBACH [address] [place], [place]  [address]  To Whom It May Concern [name] will be needed to assist her mother referenced above in medical care including transportation and general assistance including activities of daily living starting from 13 September through November 4.         Sincerely,   Fuller Canada MD

## 2010-02-11 NOTE — Progress Notes (Signed)
Summary: returning call  Phone Note Other Incoming Call back at [facility]   Caller: Donnamarie Rossetti  Summary of Call: Donnamarie Rossetti is calling Dr. Lodema Hong back on her mother Enrique Weiss. please call her back Dr. Lodema Hong at [phone]. sorry i didn't get you out of room but knew you was running behide. Also daughter stated call her at anytime is fine. Initial call taken by: Rudene Anda,  January 15, 2009 4:32 PM  Follow-up for Phone Call        i attempted to call at 5:15, could not get through, pls call her around 4:30, pls tell her alot of arthritisds in her back, she does have some plaques in her arteries but no significant blockage, let her know I tried to call today Follow-up by: Syliva Overman MD,  January 15, 2009 5:20 PM  Additional Follow-up for Phone Call Additional follow up Details #1::        Phone call completed, wants to know if any pain meds will be prescribed Additional Follow-up by: Worthy Keeler LPN,  January 16, 2009 4:43 PM    Additional Follow-up for Phone Call Additional follow up Details #2::    LEFT MESSAGE Follow-up by: Lind Guest,  January 16, 2009 4:44 PM  Additional Follow-up for Phone Call Additional follow up Details #3:: Details for Additional Follow-up Action Taken: discussed with  pt's daughter no new pain meds except  one extra strength tylenol once daily, adfvised her top enquire about level of general functioning eg sleep, getting around etc Additional Follow-up by: Syliva Overman MD,  January 16, 2009 5:50 PM

## 2010-02-11 NOTE — Assessment & Plan Note (Signed)
Summary: FLUID IN LEG PER DR HARRISON   Vital Signs:  Patient profile:   77 year old female Menstrual status:  hysterectomy Height:      64 inches Weight:      202 pounds BMI:     34.80 O2 Sat:      91 % Pulse rate:   78 / minute Resp:     16 per minute BP sitting:   124 / 80  (left arm)  Vitals Entered By: Everitt Amber LPN (September 12, 2009 10:36 AM) CC: Follow up chronic problems, she is holding fluid everywhere especially in her arms and legs.    Primary Care Provider:  Dr. Syliva Overman  CC:  Follow up chronic problems and she is holding fluid everywhere especially in her arms and legs. .  History of Present Illness: Reports  that they she is not doing  well. she c/o chronic and uncontrolled and severe pain in the knees and leg swelling Denies recent fever or chills. Denies sinus pressure, nasal congestion , ear pain or sore throat. Denies chest congestion, or cough productive of sputum. Denies chest pain, palpitations, PND, orthopnea or leg swelling. Denies abdominal pain, nausea, vomitting, diarrhea or constipation. Denies change in bowel movements or bloody stool. Denies dysuria , frequency, incontinence or hesitancy.  Denies headaches, vertigo, seizures. Denies depression, anxiety or insomnia. Denies  rash, lesions, or itch.     Current Medications (verified): 1)  Norco 5-325 Mg Tabs (Hydrocodone-Acetaminophen) .... One Tab By Mouth Bid 2)  Fish Oil 1000 Mg Caps (Omega-3 Fatty Acids) .... Take 1 Tablet By Mouth Two Times A Day 3)  Womens Multivitamin Plus  Tabs (Multiple Vitamins-Minerals) .... Take 1 Tablet By Mouth Once A Day 4)  Nitrostat 0.4 Mg Subl (Nitroglycerin) .Marland Kitchen.. 1 Tablet Under Tongue At Onset of Chest Pain; You May Repeat Every 5 Minutes For Up To 3 Doses. 5)  Ranitidine Hcl 150 Mg Caps (Ranitidine Hcl) .... Take 1 Capsule By Mouth Two Times A Day 6)  Focus Factor .... Take 1 Tablet By Mouth Once A Day 7)  Ra Arthritis Pain Relief 650 Mg Cr-Tabs  (Acetaminophen) .... Take As Needed For Pain 8)  Aleve 220 Mg Tabs (Naproxen Sodium) .... Take As Needed 9)  Sm Stool Softener 100 Mg Caps (Docusate Sodium) .... Use As Needed 10)  Ex-Lax 15 Mg Chew (Sennosides) .... Use As Needed 11)  Pravastatin Sodium 40 Mg Tabs (Pravastatin Sodium) .... Take One Tablet By Mouth Daily At Bedtime 12)  Proair Hfa 108 (90 Base) Mcg/act Aers (Albuterol Sulfate) .... 2 Puffs Every 6 To 8 Hours As Needed  Allergies (verified): 1)  ! Penicillin 2)  ! * Dye 3)  ! Prednisone (Pak) (Prednisone) 4)  Iron  Review of Systems General:  Complains of fatigue. Eyes:  Denies discharge and red eye. Endo:  Denies excessive thirst and excessive urination. Heme:  Denies abnormal bruising and bleeding. Allergy:  Complains of seasonal allergies; denies hives or rash and itching eyes.  Physical Exam  General:  Well-developed,overweight,in no acute distress; alert,appropriate and cooperative throughout examination HEENT: No facial asymmetry,  EOMI, No sinus tenderness, TM's Clear, oropharynx  pink and moist.   Chest: decreased air entry, no wheezes, no crackles CVS: S1, S2, No murmurs, No S3. one plus pitting edema bilateraLLY  Abd: Soft, nontender MS: decreased  ROM spine, hips, shoulders and knees.  Ext: No edema.   CNS: CN 2-12 intact, power tone and sensation normal throughout.  Skin: erythema and warmth around left ankle, no purlent drainage, mild tenderness Psych: Good eye contact, normal affect.  Memory loss, mild, not anxious or depressed appearing.    Impression & Recommendations:  Problem # 1:  LEG EDEMA (ICD-782.3) Assessment Deteriorated  Her updated medication list for this problem includes:    Furosemide 20 Mg Tabs (Furosemide) .Marland Kitchen... Take 1 tablet by mouth once a day  as needed for leg swelling  Orders: Furosemide- Lasix Injection (J1940) Admin of Therapeutic Inj  intramuscular or subcutaneous ([address])  Problem # 2:  VARICOSE VEINS LOWER  EXTREMITIES W/INFLAMMATION (ICD-454.1) Assessment: Comment Only pt to wear support hose, and elevate legs  Problem # 3:  DEGENERATIVE JOINT DISEASE, RIGHT KNEE (ICD-715.96) Assessment: Unchanged  Her updated medication list for this problem includes:    Norco 5-325 Mg Tabs (Hydrocodone-acetaminophen) ..... One tab by mouth bid    Ra Arthritis Pain Relief 650 Mg Cr-tabs (Acetaminophen) .Marland Kitchen... Take as needed for pain    Aleve 220 Mg Tabs (Naproxen sodium) .Marland Kitchen... Take as needed for replaceemnt in the near future  Problem # 4:  TOBACCO ABUSE (ICD-305.1) Assessment: Unchanged  Encouraged smoking cessation and discussed different methods for smoking cessation.   Complete Medication List: 1)  Norco 5-325 Mg Tabs (Hydrocodone-acetaminophen) .... One tab by mouth bid 2)  Fish Oil 1000 Mg Caps (Omega-3 fatty acids) .... Take 1 tablet by mouth two times a day 3)  Womens Multivitamin Plus Tabs (Multiple vitamins-minerals) .... Take 1 tablet by mouth once a day 4)  Nitrostat 0.4 Mg Subl (Nitroglycerin) .Marland Kitchen.. 1 tablet under tongue at onset of chest pain; you may repeat every 5 minutes for up to 3 doses. 5)  Ranitidine Hcl 150 Mg Caps (Ranitidine hcl) .... Take 1 capsule by mouth two times a day 6)  Focus Factor  .... Take 1 tablet by mouth once a day 7)  Ra Arthritis Pain Relief 650 Mg Cr-tabs (Acetaminophen) .... Take as needed for pain 8)  Aleve 220 Mg Tabs (Naproxen sodium) .... Take as needed 9)  Sm Stool Softener 100 Mg Caps (Docusate sodium) .... Use as needed 10)  Ex-lax 15 Mg Chew (Sennosides) .... Use as needed 11)  Pravastatin Sodium 40 Mg Tabs (Pravastatin sodium) .... Take one tablet by mouth daily at bedtime 12)  Proair Hfa 108 (90 Base) Mcg/act Aers (Albuterol sulfate) .... 2 puffs every 6 to 8 hours as needed 13)  Furosemide 20 Mg Tabs (Furosemide) .... Take 1 tablet by mouth once a day  as needed for leg swelling 14)  Potassium Chloride Crys Cr 20 Meq Cr-tabs (Potassium chloride crys  cr) .... One tablet daily on the day that you take lasix (fluid pill)  Other Orders: Influenza Vaccine MCR ([address])  Patient Instructions: 1)  f/u in 6 weeks 2)  You will get an injection today for the leg swelling, also meds are sent  for you to take for leg swelling. 3)  Pls use hose to reduce the swelling. 4)  Also keep your legs elevated as much as possible Prescriptions: POTASSIUM CHLORIDE CRYS CR 20 MEQ CR-TABS (POTASSIUM CHLORIDE CRYS CR) one tablet daily on the day that you take lasix (fluid pill)  #10 x 0   Entered and Authorized by:   Syliva Overman MD   Signed by:   Syliva Overman MD on 09/12/2009   Method used:   Electronically to        [facility] [address] [phone]* (retail)  [address] [place], [place]  [address]       Ph: [phone]       Fax: [phone]   RxID:   [id number] FUROSEMIDE 20 MG TABS (FUROSEMIDE) Take 1 tablet by mouth once a day  as needed for leg swelling  #10 x 0   Entered and Authorized by:   Syliva Overman MD   Signed by:   Syliva Overman MD on 09/12/2009   Method used:   Electronically to        [facility] [address] [phone]* (retail)       [address] [place], [place]  [address]       Ph: [phone]       Fax: [phone]   RxID:   [id number]    Influenza Vaccine (to be given today)      Medication Administration  Injection # 1:    Medication: Furosemide- Lasix Injection    Diagnosis: LEG EDEMA (ICD-782.3)    Route: IM    Site: RUOQ gluteus    Exp Date: 03/2010    Lot #: [id number]     Mfr: hospira    Comments: 10mg  given     Patient tolerated injection without complications    Given by: Everitt Amber LPN (September 12, 2009 11:25 AM)  Orders Added: 1)  Influenza Vaccine MCR [00025] 2)  Est. Patient Level IV [01093] 3)  Furosemide- Lasix Injection [J1940] 4)  Admin of Therapeutic Inj  intramuscular or subcutaneous [23557]

## 2010-02-11 NOTE — Assessment & Plan Note (Signed)
Summary: schedule knee surgery needs xr/mcr/uhc/bsf   Visit Type:  Follow-up Referring Provider:  Orthopaedics-Dr. Romeo Apple Primary Provider:  Dr. Syliva Overman  CC:  knee pain.  History of Present Illness: I saw Jennifer [name] in the office today for a followup visit.  She is a 77 years old woman with the complaint of:  knee pain  Xrays today.  The patient is finally ready to schedule knee replacement surgery after several years of knee pain.  She was advised at least 5 years ago to have knee replacement surgery on the RIGHT knee but declined and has had multiple injections and tried pain medication with no success  She had LEFT knee replacement in [place] several years ago with fairly good result although she continues to complain of LEFT knee pain.  She is suspected to have lumbar disc disease as well.    Allergies: 1)  ! Penicillin 2)  ! * Dye 3)  ! Prednisone (Pak) (Prednisone) 4)  Iron  Past History:  Past Medical History: Last updated: 08/05/2009 Chest pain-minimal coronary disease in 2006 with a 50% mid left anterior descending lesion and a             25% stenosis in the dominant RCA. Right mastectomy for carcinoma in 1998; excision of apparently benign left breast mass in 2007 Cerebrovascular disease-right carotid bruit COPD Hypertension-mild to moderate left ventricular hypertrophy by echocardiography in 2006 Hyperlipidemia Osteoarthritis knees; status post left TKA; surgery on the right is anticipated in the near future Tobacco abuse-10 pack years; 0.5-0.75 pack per day Gastroesophageal reflux disease; hiatal hernia Anemia-iron deficiency post op back pain, chronic pulmonary nodules, stable since 2006 cellulitis, right finger shingles, right breast genital ulcer, hx  Past Surgical History: Last updated: 02-22-2007 Cholecystectomy Hysterectomy approx. 40 years ago with bilat. Oophorectomy Mastectomy-right 1998, left 2007 Total knee  replacement-left-4/08  Family History: Last updated: 22-Feb-2007 mother-deceased-62-breast cancer father-deceased-75-prostate cancer, HTN sisters x3 living            x2 deceased-HTN, asthma brothers x2 living              x3 deceased- children x2 living in [place]  Social History: Last updated: 2007-02-22 Retired from [employer] in 1986 widowed Current Smoker-1/2 ppd Alcohol use-no Drug use-no  Risk Factors: Smoking Status: current (12/24/2008) Packs/Day: 0.75 (12/24/2008)  Review of Systems Constitutional:  Denies weight loss, weight gain, fever, chills, and fatigue. Cardiovascular:  Denies chest pain, palpitations, fainting, and murmurs. Respiratory:  Denies short of breath, wheezing, couch, tightness, pain on inspiration, and snoring . Gastrointestinal:  Denies heartburn, nausea, vomiting, diarrhea, constipation, and blood in your stools. Endocrine:  Denies excessive thirst, exessive urination, and heat or cold intolerance. Psychiatric:  Denies nervousness, depression, anxiety, and hallucinations. Skin:  Complains of redness; history of venous stasis disease occasionally has some redness in the lower LEFT leg  Postop infection of the soft tissue with cellulitis LEFT leg after knee replaced. HEENT:  Denies blurred or double vision, eye pain, redness, and watering. Immunology:  Denies seasonal allergies, sinus problems, and allergic to bee stings. Hemoatologic:  Denies easy bleeding and brusing.  Physical Exam  Additional Exam:  The patient is well developed well nourished with no deformities. she is awake alert and oriented x3 mood and affect are normal.  She has no sensory changes in the RIGHT lower extremity perfusion on limbs are normal there is no peripheral edema skin is intact lymph nodes are normal   right knee: painful range of motion of  the RIGHT knee with antalgic gait tenderness lateral medial compartments with crepitance on range of motion strength is normal,  stability intact meniscal sign negative.     Impression & Recommendations:  Problem # 1:  DEGENERATIVE JOINT DISEASE, RIGHT KNEE (ICD-715.96) Assessment Deteriorated  new x-rays were obtained which shows that she has significant varus arthritis severe disease moderate deformity posterior compartment spurring lateral edge joint spurring as well.  Recommend knee replacement for this severe arthritic knee  RIGHT total knee arthroplasty Depuw  Orders: Est. Patient Level IV ([address]) Knee x-ray,  3 views ([address])  Patient Instructions: 1)  Preop visit is scheduled for 09-20-2009 @ 915am  2)  Surgery Sept 13th  3)    4)  Return to the office Sept 27th

## 2010-02-11 NOTE — Progress Notes (Signed)
Summary: No pre-authorization req'd for in-patient surgery  Phone Note Outgoing Call   Call placed to: Insurer Summary of Call: Per Medicare guidelines, no pre-auth is needed for in-patient surgery scheduled on 09/24/09, RT total knee arthroplasty, [facility]. Initial call taken by: Cammie Sickle,  September 23, 2009 11:03 AM

## 2010-02-11 NOTE — Assessment & Plan Note (Signed)
Summary: POST OP WOUND CHECK/TKA 09/24/09/CAF   Visit Type:  post op Referring Provider:  Orthopaedics-Dr. Romeo Apple Primary Provider:  Dr. Syliva Overman  CC:  right knee pain.  History of Present Illness: I saw Jennifer [name] in the office today for a followup visit.  She is a 77 years old woman with the complaint of:  right knee  DOS 09-24-09. Right total knee replacement.  the nursing home called in complaint of the patient is having yellow drainage from the wound, that the knee was warm to touch and that she had RIGHT leg swelling  She was advised to come in.  She was brought in by transport which was difficult.  The patient has subcutaneous ecchymosis which runs from the thigh down into the calf she does have some ankle swelling and ecchymosis as well.  The incision line looks clean there is some scant yellow drainage on the dressing.  The leg is warm to touch but nothing out of the ordinary.  The only abnormality I see is a subcutaneous bleeding son stopping the Coumadin.  She will decrease the CPM time for one hour twice a day she says this is killing her  Her range of motion is about 5-80.  We've also advised the therapy center to give her albuterol treatments for her wheezing, she is a chronic smoker smoker.  Otherwise I don't see anything out of the ordinary.  Followup in one week on Tuesday for staple removal.  I did allow Staples to come out every other one on the 24  Allergies: 1)  ! Penicillin 2)  ! * Dye 3)  ! Prednisone (Pak) (Prednisone) 4)  Iron   Impression & Recommendations:  Problem # 1:  TOTAL KNEE FOLLOW-UP (ICD-V43.65)  Orders: Post-Op Check ([address])

## 2010-02-11 NOTE — Letter (Signed)
Summary: Letter  Letter   Imported By: Lind Guest 06/18/2009 14:47:47  _____________________________________________________________________  External Attachment:    Type:   Image     Comment:   External Document

## 2010-02-11 NOTE — Assessment & Plan Note (Signed)
Summary: REQ INJECT IN RT KNEE/?XR/MEDICARE/BSF   Visit Type:  Follow-up Referring Provider:  self Primary Provider:  Dr. Syliva Overman  CC:  right knee OA.Marland Kitchen  History of Present Illness: I saw Jennifer [name] in the office today for a followup visit.  She is a 77 years old woman with the complaint of:  requesting injection right knee OA.  Last injection was 02/28/09, lasted 2 days.  Norco 5 for pain, no relief, takes 1 at a time.  Verbal consent was obtained. The knee was prepped with alcohol and ethyl chloride. 1 cc of depomedrol 40mg /cc and 4 cc of lidocaine 1% was injected. there were no complications. her RIGHT knee      Allergies: 1)  ! Penicillin 2)  ! * Dye 3)  ! Prednisone (Pak) (Prednisone) 4)  Iron   Other Orders: Joint Aspirate / Injection, Large (20610) Depo- Medrol 40mg  (J1030)  Patient Instructions: 1)  You have received an injection of cortisone today. You may experience increased pain at the injection site. Apply ice pack to the area for 20 minutes every 2 hours and take 2 xtra strength tylenol every 8 hours. This increased pain will usually resolve in 24 hours. The injection will take effect in 3-10 days.  2)   come back as needed 3)  You need total knee replacement

## 2010-02-11 NOTE — Progress Notes (Signed)
  Phone Note Call from Patient   Summary of Call: Insurance not paying for Omeprazole 20mg  capsule. Change to alternative please. Initial call taken by: Everitt Amber LPN,  March 08, 2009 1:14 PM  Follow-up for Phone Call        will try ranitidine 150mg  twice daily,pls stamp and fax the d/c omeprazole to pharmacy and  let pt know  Follow-up by: Syliva Overman MD,  March 08, 2009 1:34 PM  Additional Follow-up for Phone Call Additional follow up Details #1::        sent d/c order to the pharmacy, Patients family member will get her to call back later Additional Follow-up by: Everitt Amber LPN,  March 08, 2009 1:40 PM    Additional Follow-up for Phone Call Additional follow up Details #2::    pharmacy aware of the change and will let her know when pt come to get rx Follow-up by: Everitt Amber LPN,  March 11, 2009 10:44 AM  New/Updated Medications: RANITIDINE HCL 150 MG CAPS (RANITIDINE HCL) Take 1 capsule by mouth two times a day Prescriptions: RANITIDINE HCL 150 MG CAPS (RANITIDINE HCL) Take 1 capsule by mouth two times a day  #60 x 4   Entered and Authorized by:   Syliva Overman MD   Signed by:   Syliva Overman MD on 03/08/2009   Method used:   Printed then faxed to ...       [facility] [address] [phone]* (retail)       [address] [place]  [address]       Ph: [phone]       Fax: [phone]   RxID:   [id number]

## 2010-02-11 NOTE — Miscellaneous (Signed)
Summary: PT progress note  PT progress note   Imported By: Jacklynn Ganong 11/27/2009 14:13:44  _____________________________________________________________________  External Attachment:    Type:   Image     Comment:   External Document

## 2010-02-11 NOTE — Miscellaneous (Signed)
Summary: refill  Clinical Lists Changes  Medications: Added new medication of PROAIR HFA 108 (90 BASE) MCG/ACT AERS (ALBUTEROL SULFATE) inhale 2 puffs by mouth every 6 hours prn - Signed Rx of PROAIR HFA 108 (90 BASE) MCG/ACT AERS (ALBUTEROL SULFATE) inhale 2 puffs by mouth every 6 hours prn;  #8.5 x 2;  Signed;  Entered by: Worthy Keeler LPN;  Authorized by: Syliva Overman MD;  Method used: Electronically to [facility] [address] [phone]*, [address], [place], [place]  [address], Ph: [phone], Fax: [phone]    Prescriptions: PROAIR HFA 108 (90 BASE) MCG/ACT AERS (ALBUTEROL SULFATE) inhale 2 puffs by mouth every 6 hours prn  #8.5 x 2   Entered by:   Worthy Keeler LPN   Authorized by:   Syliva Overman MD   Signed by:   Worthy Keeler LPN on 57/84/6962   Method used:   Electronically to        [facility] [address] [phone]* (retail)       [address] [place]  [address]       Ph: [phone]       Fax: [phone]   RxID:   [id number]

## 2010-02-11 NOTE — Assessment & Plan Note (Signed)
Summary: F UP   Vital Signs:  Patient profile:   77 year old female Menstrual status:  hysterectomy Height:      64 inches Weight:      186 pounds O2 Sat:      97 % Pulse rate:   76 / minute Pulse rhythm:   regular Resp:     16 per minute BP sitting:   130 / 80 Cuff size:   large  Vitals Entered By: Everitt Amber (January 31, 2009 4:00 PM) CC: Follow up chronic problems Is Patient Diabetic? No   Primary Care Provider:  Dr. Syliva Overman  CC:  Follow up chronic problems.  History of Present Illness: c/o increased right knee pain has ortho appt in 4 days, states she is out of her pain meds which are now due, an she would like some help for her pain as she cannot stand it. She denies any falls. She wants to have knee replacement in [place] and is waiting on her daughter to relocateso she will have the help at home post op which she will need. She is still smoking, no quit date sety. She dneies any recent fever or chills.   Preventive Screening-Counseling & Management  Alcohol-Tobacco     Smoking Cessation Counseling: yes  Current Medications (verified): 1)  Norco 5-325 Mg Tabs (Hydrocodone-Acetaminophen) .... One Tab By Mouth Bid 2)  Fish Oil 1000 Mg Caps (Omega-3 Fatty Acids) .... Take 1 Tablet By Mouth Once A Day 3)  Womens Multivitamin Plus  Tabs (Multiple Vitamins-Minerals) .... Take 1 Tablet By Mouth Once A Day 4)  Ammonium Lactate 12 % Lotn (Ammonium Lactate) .... Apply Twice Daily 5)  Flonase 50 Mcg/act Susp (Fluticasone Propionate) .... One To Two Puffs Per Nostril Daily 6)  Omeprazole 20 Mg Cpdr (Omeprazole) .... Take 1 Capsule By Mouth Once A Day 7)  Mucinex 600 Mg Xr12h-Tab (Guaifenesin) .... One Tab By Mouth Bid 8)  Nitrostat 0.4 Mg Subl (Nitroglycerin) .Marland Kitchen.. 1 Tablet Under Tongue At Onset of Chest Pain; You May Repeat Every 5 Minutes For Up To 3 Doses.  Allergies (verified): 1)  ! Penicillin 2)  ! * Dye 3)  ! Prednisone (Pak) (Prednisone) 4)   Iron  Review of Systems      See HPI ENT:  Denies hoarseness, nasal congestion, and sinus pressure. CV:  Denies chest pain or discomfort, palpitations, and swelling of hands. Resp:  Denies cough, sputum productive, and wheezing. GI:  Denies abdominal pain, constipation, diarrhea, nausea, and vomiting. GU:  Denies dysuria and urinary frequency. Neuro:  Complains of memory loss. Psych:  Denies anxiety and depression.  Physical Exam  General:  Well-developed,overweight,in no acute distress; alert,appropriate and cooperative throughout examination HEENT: No facial asymmetry,  EOMI, No sinus tenderness, TM's Clear, oropharynx  pink and moist.   Chest: decreased air entry, bilateral wheezes CVS: S1, S2, No murmurs, No S3.   Abd: Soft, nontender MS: decreased  ROM spine, hips, shoulders and knees.  Ext: No edema.   CNS: CN 2-12 intact, power tone and sensation normal throughout.   Skin: Intact, no visible lesions or rashes.  Psych: Good eye contact, normal affect.  Memory loss, mild, not anxious or depressed appearing.    Impression & Recommendations:  Problem # 1:  TOBACCO ABUSE (ICD-305.1) Assessment Unchanged  Encouraged smoking cessation and discussed different methods for smoking cessation.   Problem # 2:  KNEE PAIN (EAV-409.81) Assessment: Deteriorated  Her updated medication list for this problem includes:  Norco 5-325 Mg Tabs (Hydrocodone-acetaminophen) ..... One tab by mouth bid  Orders: Depo- Medrol 80mg  (J1040) Ketorolac-Toradol 15mg  (W0981) Admin of Therapeutic Inj  intramuscular or subcutaneous (19147)  Problem # 3:  COPD (ICD-496) Assessment: Deteriorated  Complete Medication List: 1)  Norco 5-325 Mg Tabs (Hydrocodone-acetaminophen) .... One tab by mouth bid 2)  Fish Oil 1000 Mg Caps (Omega-3 fatty acids) .... Take 1 tablet by mouth once a day 3)  Womens Multivitamin Plus Tabs (Multiple vitamins-minerals) .... Take 1 tablet by mouth once a day 4)   Ammonium Lactate 12 % Lotn (Ammonium lactate) .... Apply twice daily 5)  Flonase 50 Mcg/act Susp (Fluticasone propionate) .... One to two puffs per nostril daily 6)  Omeprazole 20 Mg Cpdr (Omeprazole) .... Take 1 capsule by mouth once a day 7)  Mucinex 600 Mg Xr12h-tab (Guaifenesin) .... One tab by mouth bid 8)  Nitrostat 0.4 Mg Subl (Nitroglycerin) .Marland Kitchen.. 1 tablet under tongue at onset of chest pain; you may repeat every 5 minutes for up to 3 doses.  Patient Instructions: 1)  Please schedule a follow-up appointment in 3 months. 2)  Tobacco is very bad for your health and your loved ones! You Should stop smoking!. 3)  Stop Smoking Tips: Choose a Quit date. Cut down before the Quit date. decide what you will do as a substitute when you feel the urge to smoke(gum,toothpick,exercise). 4)  bP is 130/80. 5)  You will getinjections in the hip today, toradol and depomedrol   Medication Administration  Injection # 1:    Medication: Depo- Medrol 80mg     Diagnosis: KNEE PAIN (WGN-562.13)    Route: IM    Site: RUOQ gluteus    Exp Date: 10/2009    Lot #: [id number]    Mfr: [email or web address]    Comments: 80 mg given     Patient tolerated injection without complications    Given by: Everitt Amber (January 31, 2009 4:45 PM)  Injection # 2:    Medication: Ketorolac-Toradol 15mg     Diagnosis: KNEE PAIN (ICD-719.46)    Route: IM    Site: LUOQ gluteus    Exp Date: 08/2010    Lot #: [id number]    Mfr: [email or web address]    Comments: 60 mg given     Patient tolerated injection without complications    Given by: Everitt Amber (January 31, 2009 4:45 PM)  Orders Added: 1)  Est. Patient Level IV [08657] 2)  Depo- Medrol 80mg  [J1040] 3)  Ketorolac-Toradol 15mg  [J1885] 4)  Admin of Therapeutic Inj  intramuscular or subcutaneous [84696]

## 2010-02-11 NOTE — Progress Notes (Signed)
Summary: speak with doc  Phone Note Call from Patient   Summary of Call: pts daughter would like for you to give her a call on her mothers appt the other day [phone] Initial call taken by: Rudene Anda,  June 20, 2009 9:03 AM  Follow-up for Phone Call        spoke with her daughter , she is reassured  that all labs are fine and her mom is to see card both for clearance for upcomingknee surgery as well as f/u

## 2010-02-11 NOTE — Assessment & Plan Note (Signed)
Summary: POST OP/RT KNEE SURG/1 WK RE-CK/M[facility]/BSF   Visit Type:  post op Referring Provider:  Orthopaedics-Dr. Romeo Apple Primary Provider:  Dr. Syliva Overman  CC:  right knee pain.  History of Present Illness: I saw Jennifer [name] in the office today for a followup visit.  She is a 77 years old woman with the complaint of:  right knee.  DOS 09-24-09. Right total knee replacement.  rehabilitation:  [facility]  Meds: Coumadin 2.5 daily, Percocet 5, Robaxin 500mg , pain med does not help, receives Percocet every 4 hrs as needed pain.  several complaints today.  #1 back pain #2 body ache #3 knee aching #4 redness and tenderness in the ankle #5 ankle-foot swelling.  The overall swelling of her leg has decreased.  The ecchymosis has resolved.  She does have some redness and tenderness in the lower tibial area and swelling of the foot.  The back is tender.  The knee itself looks great all the staples are out she has approximately 95 of knee flexion and 5 flexion angle.  I'm going to change all of her medications.    Allergies: 1)  ! Penicillin 2)  ! * Dye 3)  ! Prednisone (Pak) (Prednisone) 4)  Iron   Impression & Recommendations:  Problem # 1:  TOTAL KNEE FOLLOW-UP (ICD-V43.65)  change pain medications to Robaxin, Norco 10 mg,  Orders: Post-Op Check (44010)  Problem # 2:  CELLULITIS AND ABSCESS OF UNSPECIFIED SITE (ICD-682.9)  the leg cellulitis  Start Cipro  Orders: Post-Op Check (27253)  Problem # 3:  BACK PAIN (ICD-724.5)  start Robaxin 500 mg q.8 hours to q. 6 hours, Sterapred Dosepak  Orders: Post-Op Check (66440)  Patient Instructions: 1)  Please schedule a follow-up appointment in 1 week.

## 2010-02-11 NOTE — Assessment & Plan Note (Signed)
Summary: ROV   Visit Type:  Follow-up Referring Provider:  Orthopaedics-Dr. Romeo Apple Primary Provider:  Dr. Syliva Overman   History of Present Illness: [name] returns to the office as scheduled for continued assessment and treatment of cardiovascular risk factors and chest pain.  Since her last visit, she has done quite well.  She recalls only one episode of chest discomfort, an aching sensation over the right breast without associated symptoms.  She has used nitroglycerin on one or 2 occasions with benefit.  Exercise is limited due to chronic knee problems.  She has previously undergone a left TKA and believes that she will ultimately require surgery on the right.  She has not been hospitalized or required emergency department evaluation.  She denies orthopnea, PND, lightheadedness, syncope, GI distress, nausea or emesis.  She notes mild intermittent pedal edema.  Current Medications (verified): 1)  Norco 5-325 Mg Tabs (Hydrocodone-Acetaminophen) .... One Tab By Mouth Bid 2)  Fish Oil 1000 Mg Caps (Omega-3 Fatty Acids) .... Take 1 Tablet By Mouth Two Times A Day 3)  Womens Multivitamin Plus  Tabs (Multiple Vitamins-Minerals) .... Take 1 Tablet By Mouth Once A Day 4)  Nitrostat 0.4 Mg Subl (Nitroglycerin) .Marland Kitchen.. 1 Tablet Under Tongue At Onset of Chest Pain; You May Repeat Every 5 Minutes For Up To 3 Doses. 5)  Proair Hfa 108 (90 Base) Mcg/act Aers (Albuterol Sulfate) .... Inhale 2 Puffs By Mouth Every 6 Hours Prn 6)  Ranitidine Hcl 150 Mg Caps (Ranitidine Hcl) .... Take 1 Capsule By Mouth Two Times A Day 7)  Focus Factor .... Take 1 Tablet By Mouth Once A Day 8)  Ra Arthritis Pain Relief 650 Mg Cr-Tabs (Acetaminophen) .... Take As Needed For Pain 9)  Aleve 220 Mg Tabs (Naproxen Sodium) .... Take As Needed 10)  Sm Stool Softener 100 Mg Caps (Docusate Sodium) .... Use As Needed 11)  Ex-Lax 15 Mg Chew (Sennosides) .... Use As Needed 12)  Pravastatin Sodium 40 Mg Tabs (Pravastatin  Sodium) .... Take One Tablet By Mouth Daily At Bedtime  Allergies (verified): 1)  ! Penicillin 2)  ! * Dye 3)  ! Prednisone (Pak) (Prednisone) 4)  Iron  Past History:  PMH, FH, and Social History reviewed and updated.  Past Medical History: Chest pain-minimal coronary disease in 2006 with a 50% mid left anterior descending lesion and a             25% stenosis in the dominant RCA. Right mastectomy for carcinoma in 1998; excision of apparently benign left breast mass in 2007 Cerebrovascular disease-right carotid bruit COPD Hypertension-mild to moderate left ventricular hypertrophy by echocardiography in 2006 Hyperlipidemia Osteoarthritis knees; status post left TKA; surgery on the right is anticipated in the near future Tobacco abuse-10 pack years; 0.5-0.75 pack per day Gastroesophageal reflux disease; hiatal hernia Anemia-iron deficiency post op back pain, chronic pulmonary nodules, stable since 2006 cellulitis, right finger shingles, right breast genital ulcer, hx  Review of Systems       See history of present illness.  Vital Signs:  Patient profile:   77 year old female Menstrual status:  hysterectomy Weight:      202 pounds Pulse rate:   82 / minute BP sitting:   149 / 79  (right arm)  Vitals Entered By: Dreama Saa, CNA (August 05, 2009 1:39 PM)  Physical Exam  General:  Obese; well developed; no acute distress:   Neck-No JVD; no carotid bruits: Lungs-No tachypnea, no rales; no rhonchi; no wheezes:  Cardiovascular-normal PMI; normal S1 and S2: Abdomen-BS normal; soft and non-tender without masses or organomegaly:  Musculoskeletal-No deformities, no cyanosis or clubbing: Neurologic-Normal cranial nerves; symmetric strength and tone:  Skin-Warm, no significant lesions: Extremities-Nl distal pulses; no edema; prominent varicose veins      Impression & Recommendations:  Problem # 1:  CEREBROVASCULAR DISEASE (ICD-437.9) Duplex study last year revealed  moderate plaque without significant focal stenosis.  Despite an excellent lipid profile in the absence of therapy, I believe that treatment with a statin is warranted based upon available data.  She will start pravastatin 40 mg q.d. with repeat lipid profile in one month.  Her dose of fish oil will be doubled.  Blood pressure was initially elevated slightly, but on repeat was quite normal at 120/60.  Patient has never had hypertension in the past.  Continued monitoring is warranted.  Problem # 2:  CHEST PAIN UNSPECIFIED (ICD-786.50) Chest pain is now minimal and remains atypical.  A stress nuclear study was negative in recent months.  No further testing or treatment is warranted at the present time.  Problem # 3:  TOBACCO ABUSE (ICD-305.1) Patient continues to avow that she will give up cigarette smoking, but does not actually appear to have much motivation to do so.  I once again counseled her that this is the most important single thing she can do to benefit her health.  At her next visit, I will strongly recommend a pharmacologic agent to assist her in this endeavor.  Problem # 4:  KNEE PAIN (ICD-719.46) At this point, her right knee symptoms do not appear severe enough to warrant TKA in this 77 year old woman with significant medical issues.  She will continue to discuss the appropriateness of this therapy with Dr. Romeo Apple.  From a cardiac standpoint, she is a reasonable candidate for that operation.  I will plan to see this nice woman again in 8 months.  Patient Instructions: 1)  Your physician recommends that you schedule a follow-up appointment in: 8 months 2)  Your physician recommends that you return for lab work in: 1 month 3)  Your physician has recommended you make the following change in your medication: pravastatin 40mg  daily, increase fish oil to 1 tablet by mouth two times a day Prescriptions: PRAVASTATIN SODIUM 40 MG TABS (PRAVASTATIN SODIUM) Take one tablet by mouth daily at  bedtime  #30 x 6   Entered by:   Teressa Lower RN   Authorized by:   Kathlen Brunswick, MD, [facility]   Signed by:   Teressa Lower RN on 08/05/2009   Method used:   Electronically to        [facility]. [phone]* (retail)       [address] [place], [place]  [address]       Ph: [phone]       Fax: [phone]   RxID:   [id number]    Prevention & Chronic Care Immunizations   Influenza vaccine: Fluvax MCR  (10/18/2008)    Tetanus booster: 09/24/2003: Td    Pneumococcal vaccine: Pneumovax (Medicare)  (10/31/2007)    H. zoster vaccine: 09/01/2006: Zostavax  Colorectal Screening   Hemoccult: Not documented    Colonoscopy: Abnormal  (01/30/1999)  Other Screening   Pap smear: Normal  (10/12/2005)    Mammogram: Abnormal  (07/13/2005)    DXA bone density scan: abnormal  (08/08/2007)   DXA scan due: 08/2009    Smoking status: current  (12/24/2008)  Smoking cessation counseling: yes  (07/29/2009)  Lipids   Total Cholesterol: 154  (08/02/2009)   LDL: 74  (08/02/2009)   LDL Direct: Not documented   HDL: 64  (08/02/2009)   Triglycerides: 81  (08/02/2009)    SGOT (AST): 16  (08/02/2009)   SGPT (ALT): 8  (08/02/2009)   Alkaline phosphatase: 83  (08/02/2009)   Total bilirubin: 0.3  (06/17/2009)  Self-Management Support :    Lipid self-management support: Not documented

## 2010-02-11 NOTE — Letter (Signed)
Summary: History form  History form   Imported By: Jacklynn Ganong 09/18/2009 09:23:40  _____________________________________________________________________  External Attachment:    Type:   Image     Comment:   External Document

## 2010-02-11 NOTE — Letter (Signed)
Summary: Letter TO DR. HAWKINS  Letter TO DR. HAWKINS   Imported By: Lind Guest 06/17/2009 13:42:06  _____________________________________________________________________  External Attachment:    Type:   Image     Comment:   External Document

## 2010-02-11 NOTE — Letter (Signed)
Summary: [place] [facility] at [facility]  [address] [place] [address]   Phone: [phone]  Fax: [phone]      January 31, 2009 MRN: [id number]   Jennifer [name] [address] [place], [place]  [address]   Dear [name],  Your test ordered by [name] has been reviewed by your physician (or physician assistant) and was found to be normal or stable. Your physician (or physician assistant) felt no changes were needed at this time.  ____ Echocardiogram  ____ Cardiac Stress Test  ____ Lab Work  _X___ Peripheral vascular study of arms, legs or neck  ____ CT scan or X-ray  ____ Lung or Breathing test  ____ Other:  No change in medical treatment at this time, per Dr. Dietrich Pates.  Thank you, Rehan Holness Allyne Gee RN    McNeal Bing, MD, [facility].C.Gaylord Shih, MD, F.A.C.C Lewayne Bunting, MD, F.A.C.C Nona Dell, MD, F.A.C.C Charlton Haws, MD, [facility].C.C

## 2010-02-11 NOTE — Miscellaneous (Signed)
Summary: PT progress note  PT progress note   Imported By: Jacklynn Ganong 11/26/2009 12:18:47  _____________________________________________________________________  External Attachment:    Type:   Image     Comment:   External Document

## 2010-02-11 NOTE — Assessment & Plan Note (Signed)
Summary: INFECTION   Vital Signs:  Patient profile:   77 year old female Menstrual status:  hysterectomy Height:      64 inches Weight:      207.50 pounds BMI:     35.75 O2 Sat:      97 % on Room air Pulse rate:   89 / minute Resp:     16 per minute BP sitting:   140 / 90  (left arm)  Vitals Entered By: Mauricia Area CMA (November 11, 2009 8:45 AM)  O2 Flow:  Room air CC: Both knees bothering her.   Primary Care Provider:  Dr. Syliva Overman  CC:  Both knees bothering her..  History of Present Illness: Painful raw rash in right groin since last week, states when she gets on the bicycle in therapy this makes it even worse.She has mossed today, but does intend to go for the rest of the week. She also notes swelling in her feet and hands , will reduce the ibuprofen and gabapentin by one each, she will get lasix in the office today. She otherwise has no complaints.  Current Medications (verified): 1)  Focus Factor .... Take 1 Tablet By Mouth Once A Day 2)  Neurontin 300 Mg Caps (Gabapentin) .Marland Kitchen.. 1 By Mouth Two Times A Day 3)  Multivitamin .Marland Kitchen.. 1 Tab Daily 4)  Ibuprofen 800 Mg .Marland Kitchen.. 1 Tab Three Times Daily  Allergies (verified): 1)  ! Penicillin 2)  ! * Dye 3)  ! Prednisone (Pak) (Prednisone) 4)  Iron  Review of Systems      See HPI General:  Complains of fatigue. Eyes:  Denies discharge and red eye. ENT:  Denies hoarseness, nasal congestion, sinus pressure, and sore throat. CV:  Complains of swelling of feet; denies chest pain or discomfort and palpitations. Resp:  Denies cough and sputum productive. GI:  Denies abdominal pain, constipation, diarrhea, nausea, and vomiting. GU:  Denies dysuria and urinary frequency. MS:  Complains of joint pain, low back pain, mid back pain, muscle weakness, and stiffness. Derm:  Complains of itching, lesion(s), and rash. Neuro:  Complains of memory loss and poor balance. Psych:  Complains of anxiety; denies depression. Endo:  Denies  excessive thirst and excessive urination. Heme:  Denies abnormal bruising and bleeding. Allergy:  Complains of seasonal allergies.  Physical Exam  General:  Well-developed,well-nourished,in no acute distress; alert,appropriate and cooperative throughout examination HEENT: No facial asymmetry,  EOMI, No sinus tenderness, TM's Clear, oropharynx  pink and moist.   Chest: Clear to auscultation bilaterally.  CVS: S1, S2, No murmurs, No S3.   Abd: Soft, Nontender.  MS: decreased ROM spine, hiand knees.  Ext: 2 plus  edema.   CNS: CN 2-12 intact, power tone and sensation normal throughout.   Skin: fungal and yeast infection in right groin with skin brekdown.  Psych: Good eye contact, normal affect.  Memory intact, not anxious or depressed appearing.    Impression & Recommendations:  Problem # 1:  DERMATOMYCOSIS (ICD-111.9) Assessment Comment Only  Her updated medication list for this problem includes:    Nystatin 100000 Unit/gm Powd (Nystatin) .Marland Kitchen... Apply powder three times  daily to right groin for 10 days , then as needed    Fluconazole 150 Mg Tabs (Fluconazole) .Marland Kitchen... Take 1 tablet by mouth once a day    Terbinafine Hcl 250 Mg Tabs (Terbinafine hcl) .Marland Kitchen... Take 1 tablet by mouth once a day  Orders: Medicare Electronic Prescription [phone])  Problem # 2:  BACK PAIN (ICD-724.5)  Assessment: Improved  Her updated medication list for this problem includes:    Ibuprofen 800 Mg Tabs (Ibuprofen) .Marland Kitchen... Take 1 tablet by mouth two times a day  Problem # 3:  LEG EDEMA (ICD-782.3) Assessment: Deteriorated  The following medications were removed from the medication list:    Furosemide 20 Mg Tabs (Furosemide) .Marland Kitchen... Take 1 tablet by mouth once a day  as needed for leg swelling  Orders: Furosemide- Lasix Injection (J1940) Admin of Therapeutic Inj  intramuscular or subcutaneous (09811)  Problem # 4:  TOBACCO ABUSE (ICD-305.1) Assessment: Unchanged  Encouraged smoking cessation and  discussed different methods for smoking cessation.   Complete Medication List: 1)  Focus Factor  .... Take 1 tablet by mouth once a day 2)  Multivitamin  .Marland Kitchen.. 1 tab daily 3)  Ibuprofen 800 Mg  .Marland Kitchen.. 1 tab three times daily 4)  Nystatin 100000 Unit/gm Powd (Nystatin) .... Apply powder three times  daily to right groin for 10 days , then as needed 5)  Fluconazole 150 Mg Tabs (Fluconazole) .... Take 1 tablet by mouth once a day 6)  Terbinafine Hcl 250 Mg Tabs (Terbinafine hcl) .... Take 1 tablet by mouth once a day 7)  Gabapentin 300 Mg Caps (Gabapentin) .... Take 1 tab by mouth at bedtime 8)  Ibuprofen 800 Mg Tabs (Ibuprofen) .... Take 1 tablet by mouth two times a day  Patient Instructions: 1)  F/U next wednesday or Friday , pt will let you know a convenieint time. 2)  REDUCE the dose of the ibuprofen and gabapentin as we discussed pls. 3)  You will get an injection of lasix today for swelling Prescriptions: IBUPROFEN 800 MG TABS (IBUPROFEN) Take 1 tablet by mouth two times a day  #60 x 1   Entered and Authorized by:   Syliva Overman MD   Signed by:   Syliva Overman MD on 11/11/2009   Method used:   Printed then faxed to ...       [facility] [address] [phone]* (retail)       [address] [place]  [address]       Ph: [phone]       Fax: [phone]   RxID:   [id number] GABAPENTIN 300 MG CAPS (GABAPENTIN) Take 1 tab by mouth at bedtime  #30 x 1   Entered and Authorized by:   Syliva Overman MD   Signed by:   Syliva Overman MD on 11/11/2009   Method used:   Printed then faxed to ...       [facility] [address] [phone]* (retail)       [address], [place]  [address]       Ph: [phone]       Fax: [phone]   RxID:   [id number] TERBINAFINE HCL 250 MG TABS (TERBINAFINE HCL) Take 1 tablet by mouth once a day  #7 x 0   Entered and Authorized by:   Syliva Overman MD   Signed by:   Syliva Overman MD on 11/11/2009   Method used:    Electronically to        [facility] [address] [phone]* (retail)       [address] [place], [place]  [address]       Ph: [phone]       Fax: [phone]   RxID:   [id number] FLUCONAZOLE 150 MG  TABS (FLUCONAZOLE) Take 1 tablet by mouth once a day  #2 x 0   Entered and Authorized by:   Syliva Overman MD   Signed by:   Syliva Overman MD on 11/11/2009   Method used:   Electronically to        [facility] [address] [phone]* (retail)       [address] [place], [place]  [address]       Ph: [phone]       Fax: [phone]   RxID:   [id number] NYSTATIN 100000 UNIT/GM POWD (NYSTATIN) apply powder three times  daily to right groin for 10 days , then as needed  #45 gm x 0   Entered and Authorized by:   Syliva Overman MD   Signed by:   Syliva Overman MD on 11/11/2009   Method used:   Electronically to        [facility] [address] [phone]* (retail)       [address], [place]  [address]       Ph: [phone]       Fax: [phone]   RxID:   [id number]    Medication Administration  Injection # 1:    Medication: Furosemide- Lasix Injection    Diagnosis: LEG EDEMA (ICD-782.3)    Route: IM    Site: RUOQ gluteus    Exp Date: 03/13/2010    Lot #: [id number]    Mfr: HOSPIRA    Comments: LASIX 10MG  GIVEN    Patient tolerated injection without complications    Given by: Adella Hare LPN (November 11, 2009 10:00 AM)  Orders Added: 1)  Est. Patient Level IV [16010] 2)  Medicare Electronic Prescription [G8553] 3)  Furosemide- Lasix Injection [J1940] 4)  Admin of Therapeutic Inj  intramuscular or subcutaneous [96372]     Medication Administration  Injection # 1:    Medication: Furosemide- Lasix Injection    Diagnosis: LEG EDEMA (ICD-782.3)    Route: IM    Site: RUOQ gluteus    Exp Date: 03/13/2010    Lot #: [id number]    Mfr: HOSPIRA    Comments: LASIX 10MG  GIVEN    Patient tolerated injection without complications    Given by:  Adella Hare LPN (November 11, 2009 10:00 AM)  Orders Added: 1)  Est. Patient Level IV [93235] 2)  Medicare Electronic Prescription [G8553] 3)  Furosemide- Lasix Injection [J1940] 4)  Admin of Therapeutic Inj  intramuscular or subcutaneous [57322]

## 2010-02-11 NOTE — Assessment & Plan Note (Signed)
Summary: POST OP TKA RT/1 WK RE-CHECK/MCR/UHC/CAF   Visit Type:  Follow-up Referring Yvetta Drotar:  Orthopaedics-Dr. Romeo Apple Primary Shelly Shoultz:  Dr. Syliva Overman  CC:  post op TKA.  History of Present Illness: I saw Jennifer [name] in the office today for a followup visit.  She is a 77 years old woman with the complaint of:  right knee.  DOS 09-24-09. Right total knee replacement.  rehabilitation:  [facility]  Meds: Coumadin 2.5 daily, Norco 10, Robaxin 500mg , pain med does not help.     She can go 6 hrs without pain med, daughter said, doing better.  Patient went home 10/11/09, walking well with walker.  We did address several issues #1 the patient continued to have bilateral knee pain radiating down to her ankles are 2 believe secondary to spinal stenosis as she continues to walk with a flexed posture and has equal pain in both knees  Her knee looks pretty good and I was able to passively straighten her needed to within 5 of full extension she had approximately 80 of flexion in the office  Staples are removed incision looks good  Distal leg has dark area on it from the cellulitis with saline this has improved I would like her to continue her antibiotic which is Cipro.  We also added some Neurontin to address the neurogenic pain  Followup 2 weeks      Allergies: 1)  ! Penicillin 2)  ! * Dye 3)  ! Prednisone (Pak) (Prednisone) 4)  Iron   Impression & Recommendations:  Problem # 1:  TOTAL KNEE FOLLOW-UP (ICD-V43.65)  Orders: Physical Therapy Referral (PT) Post-Op Check (10932)  Medications Added to Medication List This Visit: 1)  Neurontin 300 Mg Caps (Gabapentin) .Marland Kitchen.. 1 by mouth two times a day  Patient Instructions: 1)  Please schedule a follow-up appointment in 2 weeks. Prescriptions: NEURONTIN 300 MG CAPS (GABAPENTIN) 1 by mouth two times a day  #60 x 1   Entered and Authorized by:   Fuller Canada MD   Signed by:   Fuller Canada MD on 10/15/2009  Method used:   Printed then faxed to ...       [facility] [address] [phone]* (retail)       [address] [place]  [address]       Ph: [phone]       Fax: [phone]   RxID:   [id number]

## 2010-02-11 NOTE — Progress Notes (Signed)
Summary: question about dosage of meds  Phone Note Other Incoming   Summary of Call: Jennifer [name]'s daughter [name], asked if you will call her regarding changing the dosage on some of [name]'s medicine. [phone]  or  [phone] Initial call taken by: Jacklynn Ganong,  October 24, 2009 11:59 AM     Appended Document: question about dosage of meds hydrocodone causing MS changes  daughter stopped it   she's doing well functionally   ok to stop it

## 2010-02-11 NOTE — Letter (Signed)
Summary: surgery order RT total knee sched 09/24/09  surgery order RT total knee sched 09/24/09   Imported By: Cammie Sickle 09/03/2009 12:11:05  _____________________________________________________________________  External Attachment:    Type:   Image     Comment:   External Document

## 2010-02-11 NOTE — Progress Notes (Signed)
Summary: home therapy completed per call from [facility]  Phone Note Other Incoming   Caller: physical therapist Summary of Call: Jasmine December, [facility] physical therapist, ph [phone], called to relay that she is discharging patient today from home therapy. Patient has out-patient therapy appointment scheduled for tomorrow at [facility]. Initial call taken by: Cammie Sickle,  October 23, 2009 5:38 PM

## 2010-02-11 NOTE — Letter (Signed)
Summary: Letter TO PHYSICAL THERAPY  Letter TO PHYSICAL THERAPY   Imported By: Lind Guest 11/21/2009 12:54:59  _____________________________________________________________________  External Attachment:    Type:   Image     Comment:   External Document

## 2010-02-11 NOTE — Assessment & Plan Note (Signed)
Summary: RT KNEE PAIN/REQ INJEC/MEDICARE/CAF   Visit Type:  Follow-up Referring Provider:  self Primary Provider:  Dr. Syliva Overman  CC:  right knee oa.  History of Present Illness: I saw Jennifer [name] in the office today for a followup visit.  She is a 77 years old woman with the complaint of:   right knee pain, requesting injection.  Vicodin 5 from PCP. Has Norco 5 rx from [place] has ran out.  Has Osteoarthritis right knee.  11/28/08 last injection right knee, helped for around 2 weeks.  She says she can't have surgery now.  Injection RIGHT knee.  I went ahead and gave her hydrocodone 5-10 mg because I don't think she is ever going to have surgery and she needs some pain relief.  Verbal consent was obtained. The knee was prepped with alcohol and ethyl chloride. 1 cc of depomedrol 40mg /cc and 4 cc of lidocaine 1% was injected. there were no complications.     Allergies: 1)  ! Penicillin 2)  ! * Dye 3)  ! Prednisone (Pak) (Prednisone) 4)  Iron   Impression & Recommendations:  Problem # 1:  DEGENERATIVE JOINT DISEASE, BOTH KNEES, SEVERE (ICD-715.96) Assessment Deteriorated  Her updated medication list for this problem includes:    Norco 5-325 Mg Tabs (Hydrocodone-acetaminophen) ..... One tab by mouth bid    Vicodin 5-500 Mg Tabs (Hydrocodone-acetaminophen) ..... One by mouth q 4 hrs as needed pain    Norco 5-325 Mg Tabs (Hydrocodone-acetaminophen) .Marland Kitchen... 1-2 by mouth q 4 as needed pain   limit to 6 a day  Orders: Joint Aspirate / Injection, Large (20610) Depo- Medrol 40mg  (J1030)  Medications Added to Medication List This Visit: 1)  Vicodin 5-500 Mg Tabs (Hydrocodone-acetaminophen) .... One by mouth q 4 hrs as needed pain 2)  Norco 5-325 Mg Tabs (Hydrocodone-acetaminophen) .Marland Kitchen.. 1-2 by mouth q 4 as needed pain   limit to 6 a day  Patient Instructions: 1)  You have received an injection of cortisone today. You may experience increased pain at the injection site.  Apply ice pack to the area for 20 minutes every 2 hours and take 2 xtra strength tylenol every 8 hours. This increased pain will usually resolve in 24 hours. The injection will take effect in 3-10 days.  2)  Please schedule a follow-up appointment as needed. Prescriptions: NORCO 5-325 MG TABS (HYDROCODONE-ACETAMINOPHEN) 1-2 by mouth q 4 as needed pain   limit to 6 a day  #90 x 5   Entered and Authorized by:   Fuller Canada MD   Signed by:   Fuller Canada MD on 02/28/2009   Method used:   Print then Give to Patient   RxID:   [id number]

## 2010-02-11 NOTE — Miscellaneous (Signed)
Summary: Nursing Home order  Nursing Home order   Imported By: Cammie Sickle 09/30/2009 15:21:43  _____________________________________________________________________  External Attachment:    Type:   Image     Comment:   External Document

## 2010-02-11 NOTE — Progress Notes (Signed)
Summary: call from patient, request to speak w/Dr  Phone Note Call from Patient   Caller: Patient Summary of Call: Patient called from rehab area @[facility] .  States in a lot of pain, knee, ankle, and hurting all over, and wants to have a shot.  Ph# is the main # at [facility], ph [phone], ask for rehab.  Her nurse is [facility] nurse if need to speak to her nurse. Initial call taken by: Cammie Sickle,  October 10, 2009 9:15 AM  Follow-up for Phone Call        called back   1. we dont have any shots in the office   she is on plenty of medication  Follow-up by: Fuller Canada MD,  October 10, 2009 12:06 PM

## 2010-02-11 NOTE — Miscellaneous (Signed)
  spoke with Ms Nier and the nurse   she is being treated for lower leg cellulitis with cipro   with a 1 week follow up  also spoke to the nurse Tresa Endo  [no change in leg but only on the medicine for 1 day   Clinical Lists Changes

## 2010-02-11 NOTE — Letter (Signed)
Summary: Letter of support to assist parent  Letter of support to assist parent   Imported By: Jacklynn Ganong 10/07/2009 09:01:31  _____________________________________________________________________  External Attachment:    Type:   Image     Comment:   External Document

## 2010-02-11 NOTE — Letter (Signed)
Summary: LETTER FROM [facility]  LETTER FROM [facility]   Imported By: Faythe Ghee 06/17/2009 16:55:31  _____________________________________________________________________  External Attachment:    Type:   Image     Comment:   External Document

## 2010-02-11 NOTE — Assessment & Plan Note (Signed)
Summary: office visit   Vital Signs:  Patient profile:   77 year old female Menstrual status:  hysterectomy Height:      64 inches Weight:      197.75 pounds BMI:     34.07 O2 Sat:      94 % Pulse rate:   84 / minute Pulse rhythm:   regular Resp:     16 per minute BP sitting:   118 / 72  (left arm) Cuff size:   large  Vitals Entered By: Everitt Amber LPN (July 29, 2009 10:52 AM) CC: Follow up chronic problems   Primary Care Provider:  Dr. Syliva Overman  CC:  Follow up chronic problems.  History of Present Illness: Reports  thatshe has been doing fairly well.She does however state that the pain in her right knee is becoming overbearing, and she is llooking forward to having surgery. Denies recent fever or chills. Denies sinus pressure, nasal congestion , ear pain or sore throat. Denies chest congestion, or cough productive of sputum.Shedoes have a chronic smokers cough, and unfortunately is still smoking, I explained that this was her greatest surgical risk and that she really needed to quit. Denies chest pain, palpitations, PND, orthopnea or leg swelling. Denies abdominal pain, nausea, vomitting, diarrhea or constipation. Denies change in bowel movements or bloody stool. Denies dysuria , frequency, incontinence or hesitancy.  Denies headaches, vertigo, seizures. Denies depression, anxiety or insomnia. Denies  rash, lesions, or itch.     Preventive Screening-Counseling & Management  Alcohol-Tobacco     Smoking Cessation Counseling: yes  Current Medications (verified): 1)  Norco 5-325 Mg Tabs (Hydrocodone-Acetaminophen) .... One Tab By Mouth Bid 2)  Fish Oil 1000 Mg Caps (Omega-3 Fatty Acids) .... Take 1 Tablet By Mouth Once A Day 3)  Womens Multivitamin Plus  Tabs (Multiple Vitamins-Minerals) .... Take 1 Tablet By Mouth Once A Day 4)  Flonase 50 Mcg/act Susp (Fluticasone Propionate) .... One To Two Puffs Per Nostril Daily 5)  Nitrostat 0.4 Mg Subl (Nitroglycerin)  .Marland Kitchen.. 1 Tablet Under Tongue At Onset of Chest Pain; You May Repeat Every 5 Minutes For Up To 3 Doses. 6)  Proair Hfa 108 (90 Base) Mcg/act Aers (Albuterol Sulfate) .... Inhale 2 Puffs By Mouth Every 6 Hours Prn 7)  Ranitidine Hcl 150 Mg Caps (Ranitidine Hcl) .... Take 1 Capsule By Mouth Two Times A Day 8)  Focus Factor .... Take 1 Tablet By Mouth Once A Day  Allergies (verified): 1)  ! Penicillin 2)  ! * Dye 3)  ! Prednisone (Pak) (Prednisone) 4)  Iron  Review of Systems      See HPI General:  Complains of fatigue and loss of appetite. Eyes:  Denies discharge, double vision, eye pain, and red eye. MS:  Complains of joint pain, joint swelling, muscle weakness, and stiffness. Endo:  Denies excessive thirst and excessive urination. Heme:  Denies abnormal bruising and bleeding. Allergy:  Complains of seasonal allergies; denies hives or rash and itching eyes.  Physical Exam  General:  Well-developed,overweight,in no acute distress; alert,appropriate and cooperative throughout examination HEENT: No facial asymmetry,  EOMI, No sinus tenderness, TM's Clear, oropharynx  pink and moist.   Chest: decreased air entry, nol wheezes CVS: S1, S2, No murmurs, No S3.   Abd: Soft, nontender MS: decreased  ROM spine, hips, shoulders and knees.  Ext: No edema.   CNS: CN 2-12 intact, power tone and sensation normal throughout.   Skin: Intact, no visible lesions or rashes.  Psych:  Good eye contact, normal affect.  Memory loss, mild, not anxious or depressed appearing.    Impression & Recommendations:  Problem # 1:  COPD (ICD-496) Assessment Deteriorated  Her updated medication list for this problem includes:    Proair Hfa 108 (90 Base) Mcg/act Aers (Albuterol sulfate) ..... Inhale 2 puffs by mouth every 6 hours prn  Pulmonary Functions Reviewed: O2 sat: 94 (07/29/2009)     Vaccines Reviewed: Pneumovax: Pneumovax (Medicare) (10/31/2007)   Flu Vax: Fluvax MCR (10/18/2008)  Problem # 2:  KNEE  PAIN (EAV-409.81) Assessment: Deteriorated  Her updated medication list for this problem includes:    Norco 5-325 Mg Tabs (Hydrocodone-acetaminophen) ..... One tab by mouth bid hAS upcoming surgery  Problem # 3:  TOBACCO ABUSE (ICD-305.1) Assessment: Unchanged  Encouraged smoking cessation and discussed different methods for smoking cessation.   Complete Medication List: 1)  Norco 5-325 Mg Tabs (Hydrocodone-acetaminophen) .... One tab by mouth bid 2)  Fish Oil 1000 Mg Caps (Omega-3 fatty acids) .... Take 1 tablet by mouth once a day 3)  Womens Multivitamin Plus Tabs (Multiple vitamins-minerals) .... Take 1 tablet by mouth once a day 4)  Flonase 50 Mcg/act Susp (Fluticasone propionate) .... One to two puffs per nostril daily 5)  Nitrostat 0.4 Mg Subl (Nitroglycerin) .Marland Kitchen.. 1 tablet under tongue at onset of chest pain; you may repeat every 5 minutes for up to 3 doses. 6)  Proair Hfa 108 (90 Base) Mcg/act Aers (Albuterol sulfate) .... Inhale 2 puffs by mouth every 6 hours prn 7)  Ranitidine Hcl 150 Mg Caps (Ranitidine hcl) .... Take 1 capsule by mouth two times a day 8)  Focus Factor  .... Take 1 tablet by mouth once a day  Patient Instructions: 1)  Please schedule a follow-up appointment in 3 months. 2)  Tobacco is very bad for your health and your loved ones! You Should stop smoking!. 3)  Stop Smoking Tips: Choose a Quit date. Cut down before the Quit date. decide what you will do as a substitute when you feel the urge to smoke(gum,toothpick,exercise). 4)  It is important that you exercise regularly at least 20 minutes 5 times a week. If you develop chest pain, have severe difficulty breathing, or feel very tired , stop exercising immediately and seek medical attention. 5)  You need to lose weight. Consider a lower calorie diet and regular exercise.  6)  You nEED to keep appt with cardiology next week. You definitely need to stop smoking.

## 2010-02-11 NOTE — Miscellaneous (Signed)
Summary: faxed med modal and gentiva for surgery  Clinical Lists Changes

## 2010-02-11 NOTE — Assessment & Plan Note (Signed)
Summary: office visit   Vital Signs:  Patient profile:   77 year old female Menstrual status:  hysterectomy Height:      64 inches Weight:      202.75 pounds O2 Sat:      98 % on Room air Pulse rate:   81 / minute Resp:     16 per minute BP sitting:   126 / 82  (left arm)  O2 Flow:  Room air CC: follow up   Primary Care Rhiann Boucher:  Dr. Syliva Overman  CC:  follow up.  History of Present Illness: Ptr in with her daughter who has been caring for her since her surgery in September. She did spend a few weeks in the nursing home for rehab, she did well and is home. She ambilates independently with her walker, and also drives. Unfortunately , she will soon be living on her own once more, and her daughter's greates fear is that she still smokes, is in denial and will not stop. She has often found her smoking in unsafe situations also, eg in bed and near a gas tank.Ms Junie Panning denies this.She also reports confusion and decreased conciousness in her mom following use of prescription narcotics per ortho and discontinued these, wants no more, the pt still c/o [pain Ms garret has a fairly good apetite and her bowel movements are regular. she denies dysuria or frequency.  Allergies (verified): 1)  ! Penicillin 2)  ! * Dye 3)  ! Prednisone (Pak) (Prednisone) 4)  Iron  Past History:  Past medical, surgical, family and social histories (including risk factors) reviewed, and no changes noted (except as noted below).  Past Medical History: Reviewed history from 09/11/2009 and no changes required. Chest pain-minimal coronary disease in 2006 with a 50% mid left anterior descending lesion and a             25% stenosis in the dominant RCA. Right mastectomy for carcinoma in 1998; excision of apparently benign left breast mass in 2007 Cerebrovascular disease-right carotid bruit COPD Hypertension-mild to moderate left ventricular hypertrophy by echocardiography in  2006 Hyperlipidemia Osteoarthritis knees; status post left TKA; surgery on the right is anticipated in the near future Tobacco abuse-10 pack years; 0.5-0.75 pack per day Gastroesophageal reflux disease; hiatal hernia Anemia-iron deficiency post op back pain, chronic pulmonary nodules, stable since 2006 cellulitis, right finger shingles, right breast genital ulcer, hx skin infections  Past Surgical History: Cholecystectomy Hysterectomy approx. 40 years ago with bilat. Oophorectomy Mastectomy-right 1998, left 2007 Total knee replacement-left-4/08 Right knee repalacement 09/2009, Dr Romeo Apple  Family History: Reviewed history from 09/11/2009 and no changes required. mother-deceased-62-breast cancer father-deceased-75-prostate cancer, HTN sisters x3 living            x2 deceased-HTN, asthma brothers x2 living              x3 deceased- children x2 living in [place] Family History of Arthritis  Social History: Reviewed history from 09/11/2009 and no changes required. Retired from [employer] in 1986 widowed Current Smoker-1/2 ppd, in process of quitting Alcohol use-no Drug use-no occasional caffeine use 12th grade ed.  Review of Systems      See HPI General:  Complains of fatigue and sleep disorder. Eyes:  Denies discharge and red eye. Resp:  Complains of cough, shortness of breath, and wheezing. MS:  Complains of joint pain, low back pain, mid back pain, muscle weakness, and stiffness. Psych:  Complains of irritability. Endo:  Denies cold intolerance, excessive thirst, excessive urination, and  heat intolerance. Heme:  Denies abnormal bruising and bleeding. Allergy:  Complains of seasonal allergies.  Physical Exam  General:  Well-developed,overweight,in no acute distress; alert,appropriate and cooperative throughout examination HEENT: No facial asymmetry,  EOMI, No sinus tenderness, TM's Clear, oropharynx  pink and moist.   Chest: decreased air entry, no wheezes, no  crackles CVS: S1, S2, No murmurs, No S3. one plus pitting edema bilateraLLY  Abd: Soft, nontender MS: decreased  ROM spine, hips, shoulders and knees.  Ext: No edema.   CNS: CN 2-12 intact, power tone and sensation normal throughout.   Skin no erythema or ulceration.  Psych: Good eye contact, normal affect.  Memory loss, mild, not anxious or depressed appearing.    Impression & Recommendations:  Problem # 1:  SPINAL STENOSIS (ICD-724.00) Assessment Unchanged pt to have no narcotic meds, she beconmes confused, and has underlying memory loss  Problem # 2:  LEG EDEMA (ICD-782.3) Assessment: Improved  Her updated medication list for this problem includes:    Furosemide 20 Mg Tabs (Furosemide) .Marland Kitchen... Take 1 tablet by mouth once a day  as needed for leg swelling  Orders: T-Basic Metabolic Panel [phone])  Problem # 3:  COPD (ICD-496) Assessment: Deteriorated  The following medications were removed from the medication list:    Proair Hfa 108 (90 Base) Mcg/act Aers (Albuterol sulfate) .Marland Kitchen... 2 puffs every 6 to 8 hours as needed Her updated medication list for this problem includes:    Proair Hfa 108 (90 Base) Mcg/act Aers (Albuterol sulfate) .Marland Kitchen... 2 puffs every 6 to 8 hours as needed for wheezing    Combivent 18-103 Mcg/act Aero (Ipratropium-albuterol) .Marland Kitchen... 2 puffs every 6 to 8 hours, as needed  Orders: Medicare Electronic Prescription ([phone])  Problem # 4:  TOBACCO ABUSE (ICD-305.1) Assessment: Unchanged  Encouraged smoking cessation and discussed different methods for smoking cessation.   Complete Medication List: 1)  Focus Factor  .... Take 1 tablet by mouth once a day 2)  Furosemide 20 Mg Tabs (Furosemide) .... Take 1 tablet by mouth once a day  as needed for leg swelling 3)  Potassium Chloride Crys Cr 20 Meq Cr-tabs (Potassium chloride crys cr) .... One tablet daily on the day that you take lasix (fluid pill) 4)  Neurontin 300 Mg Caps (Gabapentin) .Marland Kitchen.. 1 by mouth two  times a day 5)  Proair Hfa 108 (90 Base) Mcg/act Aers (Albuterol sulfate) .... 2 puffs every 6 to 8 hours as needed for wheezing 6)  Combivent 18-103 Mcg/act Aero (Ipratropium-albuterol) .... 2 puffs every 6 to 8 hours, as needed  Patient Instructions: 1)  f/u in 6 to 8 weeks 2)  I am thankful that your surgery has gone well. 3)  Every day you will improve.meds only as listed. 4)  BMP prior to visit, ICD-9: today Prescriptions: COMBIVENT 18-103 MCG/ACT AERO (IPRATROPIUM-ALBUTEROL) 2 puffs every 6 to 8 hours, as needed  #1 x 3   Entered and Authorized by:   Syliva Overman MD   Signed by:   Syliva Overman MD on 10/29/2009   Method used:   Electronically to        [facility] [address] [phone]* (retail)       [address] [place], [place]  [address]       Ph: [phone]       Fax: [phone]   RxID:   [id number] PROAIR HFA 108 (90 BASE) MCG/ACT AERS (ALBUTEROL SULFATE) 2 puffs every 6 to 8 hours  as needed for wheezing  #1 x 3   Entered and Authorized by:   Syliva Overman MD   Signed by:   Syliva Overman MD on 10/29/2009   Method used:   Electronically to        [facility] [address] [phone]* (retail)       [address] [place], [place]  [address]       Ph: [phone]       Fax: [phone]   RxID:   [id number]    Orders Added: 1)  Est. Patient Level IV [44010] 2)  T-Basic Metabolic Panel [[id number]] 3)  Medicare Electronic Prescription [phone]

## 2010-02-11 NOTE — Progress Notes (Signed)
Summary: note  Phone Note Call from Patient   Summary of Call: had surgery on knee and daughter is taking care of her. Can doc write daughter a note for work. [phone] Initial call taken by: Rudene Anda,  September 24, 2009 10:41 AM  Follow-up for Phone Call        glad the surgery went well. Let her know. It is easier for dr Romeo Apple to cover her daughter's leave since he knows how long he needs her to help mom, if tis is a prob let me know, bUT advise them to get note from the ortho pls (if possible) Follow-up by: Syliva Overman MD,  September 24, 2009 5:10 PM  Additional Follow-up for Phone Call Additional follow up Details #1::        returned call, left message Additional Follow-up by: Adella Hare LPN,  September 25, 2009 9:55 AM    Additional Follow-up for Phone Call Additional follow up Details #2::    daughter aware Follow-up by: Adella Hare LPN,  September 26, 2009 4:12 PM

## 2010-02-11 NOTE — Progress Notes (Signed)
Summary: proair  Phone Note From Pharmacy   Summary of Call: [facility] called in and said they faxed over a request for proair hfa for Jennifer [name] on the 20th and haven't heard anything from the office.  Please advise. Initial call taken by: Curtis Sites,  August 09, 2009 12:00 PM  Follow-up for Phone Call        pls stamp and fax, script printed Follow-up by: Syliva Overman MD,  August 09, 2009 12:29 PM  Additional Follow-up for Phone Call Additional follow up Details #1::        faxed Additional Follow-up by: Everitt Amber LPN,  August 09, 2009 12:52 PM    New/Updated Medications: PROAIR HFA 108 (90 BASE) MCG/ACT AERS (ALBUTEROL SULFATE) 2 puffs every 6 to 8 hours as needed Prescriptions: PROAIR HFA 108 (90 BASE) MCG/ACT AERS (ALBUTEROL SULFATE) 2 puffs every 6 to 8 hours as needed  #1 x 3   Entered by:   Syliva Overman MD   Authorized by:   Everitt Amber LPN   Signed by:   Syliva Overman MD on 08/09/2009   Method used:   Printed then faxed to ...       [facility] [address] [phone]* (retail)       [address] [place]  [address]       Ph: [phone]       Fax: [phone]   RxID:   [id number]

## 2010-02-11 NOTE — Miscellaneous (Signed)
Visit Type:  Follow-up Referring Provider:  Orthopaedics-Dr. Romeo Apple Primary Provider:  Dr. Syliva Overman  CC:   right knee pain.  History of Present Illness: I saw Jennifer [name] in the office today for a followup visit.  She is a 77 years old woman with the complaint of:  knee pain  Xrays today.  The patient is finally ready to schedule knee replacement surgery after several years of knee pain.  She was advised at least 5 years ago to have knee replacement surgery on the RIGHT knee but declined and has had multiple injections and tried pain medication with no success. She c/o severe throbbing deep right knee pain without radiation which interferes with her ADL's   She had LEFT knee replacement in [place] several years ago with fairly good result although she continues to complain of LEFT knee pain.  She is suspected to have lumbar disc disease as well.    Allergies: 1)  ! Penicillin 2)  ! * Dye 3)  ! Prednisone (Pak) (Prednisone) 4)  Iron  Past History:  Past Medical History: Last updated: 08/05/2009 Chest pain-minimal coronary disease in 2006 with a 50% mid left anterior descending lesion and a             25% stenosis in the dominant RCA. Right mastectomy for carcinoma in 1998; excision of apparently benign left breast mass in 2007 Cerebrovascular disease-right carotid bruit COPD Hypertension-mild to moderate left ventricular hypertrophy by echocardiography in 2006 Hyperlipidemia Osteoarthritis knees; status post left TKA; surgery on the right is anticipated in the near future Tobacco abuse-10 pack years; 0.5-0.75 pack per day Gastroesophageal reflux disease; hiatal hernia Anemia-iron deficiency post op back pain, chronic pulmonary nodules, stable since 2006 cellulitis, right finger shingles, right breast genital ulcer, hx  Past Surgical History: Last updated: 01/26/2007 Cholecystectomy Hysterectomy approx. 40 years ago with bilat.  Oophorectomy Mastectomy-right 1998, left 2007 Total knee replacement-left-4/08  Family History: Last updated: January 26, 2007 mother-deceased-62-breast cancer father-deceased-75-prostate cancer, HTN sisters x3 living            x2 deceased-HTN, asthma brothers x2 living              x3 deceased- children x2 living in [place]  Social History: Last updated: 2007-01-26 Retired from [employer] in 1986 widowed Current Smoker-1/2 ppd Alcohol use-no Drug use-no  Risk Factors: Smoking Status: current (12/24/2008) Packs/Day: 0.75 (12/24/2008)  Review of Systems Constitutional:  Denies weight loss, weight gain, fever, chills, and fatigue. Cardiovascular:  Denies chest pain, palpitations, fainting, and murmurs. Respiratory:  Denies short of breath, wheezing, couch, tightness, pain on inspiration, and snoring . Gastrointestinal:  Denies heartburn, nausea, vomiting, diarrhea, constipation, and blood in your stools. Endocrine:  Denies excessive thirst, exessive urination, and heat or cold intolerance. Psychiatric:  Denies nervousness, depression, anxiety, and hallucinations. Skin:  Complains of redness; history of venous stasis disease occasionally has some redness in the lower LEFT leg  Postop infection of the soft tissue with cellulitis LEFT leg after knee replaced. HEENT:  Denies blurred or double vision, eye pain, redness, and watering. Immunology:  Denies seasonal allergies, sinus problems, and allergic to bee stings. Hemoatologic:  Denies easy bleeding and brusing.  Physical Exam  The patient is well developed well nourished with no deformities. she is awake alert and oriented x3 mood and affect are normal.  She has no sensory changes in the RIGHT lower extremity  perfusion on limbs are normal there is no peripheral edema skin is intact lymph nodes are normal Her  lymph nodes appear to be normal as well.  Her skin incision on the LEFT knee is normal.  She does have some venous stasis type  changes in the skin in terms of color.  She ambulates poorly favoring both legs really.  She has never fully recovered from the LEFT knee replacement.  The LEFT knee has about 110 of flexion maybe a 5 flexion contracture it is stable, strength is normal.  There is no tenderness or swelling.  She has reasonable function of her upper extremities with no contracture subluxation atrophy or tremor despite some mild rotator cuff weakness  right knee: painful range of motion of the RIGHT knee with antalgic gait tenderness lateral medial compartments with crepitance on range of motion strength is normal, stability intact meniscal sign negative.     Impression & Recommendations:  Problem # 1:  DEGENERATIVE JOINT DISEASE, RIGHT KNEE (ICD-715.96) Assessment Deteriorated  new x-rays were obtained which shows that she has significant varus arthritis severe disease moderate deformity posterior compartment spurring lateral edge joint spurring as well.  Recommend knee replacement for this severe arthritic knee  RIGHT total knee arthroplasty Depuy  Orders: Est. Patient Level IV (38101) Knee x-ray,  3 views (75102)  Patient Instructions: 1)  Preop visit is scheduled for 09-20-2009 @ 915am  2)  Surgery Sept 13th  3)    4)  Return to the office Sept 27th     Signed by Fuller Canada MD on 09/02/2009 at 9:48 AM

## 2010-02-11 NOTE — Progress Notes (Signed)
  Phone Note Other Incoming

## 2010-02-11 NOTE — Assessment & Plan Note (Signed)
Summary: LT ANKLE PAIN/?INFECTION/MEDICARE/CAF   Visit Type:  new problem Referring Provider:  Orthopaedics-Dr. Romeo Apple Primary Provider:  Dr. Syliva Overman  CC:  left ankle pain.  History of Present Illness: I saw Jennifer [name] in the office today for a  visit.  She is a 77 years old woman with the complaint of:  left ankle pain, possible infection.  Xrays Today.  Meds in EMR.  The patient saw she had an infection because she was treated for a soft tissue cellulitis in the LEFT ankle and that resolved she comes in with bilateral ankle swelling severe  She has venous stasis disease  I think she may need a diuretic to help I already prescribed her venous stasis as well as which she will get.  She has some from over 5 years ago which she doesn't really wear  I would like to get her seen before her surgery on the 13th of possible if not we will postpone it.    Allergies: 1)  ! Penicillin 2)  ! * Dye 3)  ! Prednisone (Pak) (Prednisone) 4)  Iron  Past History:  Past Medical History: Chest pain-minimal coronary disease in 2006 with a 50% mid left anterior descending lesion and a             25% stenosis in the dominant RCA. Right mastectomy for carcinoma in 1998; excision of apparently benign left breast mass in 2007 Cerebrovascular disease-right carotid bruit COPD Hypertension-mild to moderate left ventricular hypertrophy by echocardiography in 2006 Hyperlipidemia Osteoarthritis knees; status post left TKA; surgery on the right is anticipated in the near future Tobacco abuse-10 pack years; 0.5-0.75 pack per day Gastroesophageal reflux disease; hiatal hernia Anemia-iron deficiency post op back pain, chronic pulmonary nodules, stable since 2006 cellulitis, right finger shingles, right breast genital ulcer, hx skin infections  Family History: mother-deceased-62-breast cancer father-deceased-75-prostate cancer, HTN sisters x3 living            x2 deceased-HTN,  asthma brothers x2 living              x3 deceased- children x2 living in [place] Family History of Arthritis  Social History: Retired from [employer] in 1986 widowed Current Smoker-1/2 ppd, in process of quitting Alcohol use-no Drug use-no occasional caffeine use 12th grade ed.  Review of Systems Constitutional:  Complains of weight loss and weight gain; denies fever, chills, and fatigue. Cardiovascular:  Denies chest pain, palpitations, fainting, and murmurs. Respiratory:  Complains of short of breath; denies wheezing, couch, tightness, pain on inspiration, and snoring . Genitourinary:  Complains of frequency. Musculoskeletal:  Complains of joint pain, swelling, and instability. Endocrine:  Complains of exessive urination. Skin:  Complains of changes in the skin; denies poor healing, rash, itching, and redness. HEENT:  Denies blurred or double vision, eye pain, redness, and watering. Immunology:  Denies seasonal allergies, sinus problems, and allergic to bee stings. Hemoatologic:  Denies easy bleeding and brusing.   Impression & Recommendations:  Problem # 1:  VARICOSE VEINS LOWER EXTREMITIES W/INFLAMMATION (ICD-454.1) Assessment Comment Only  Orders: Est. Patient Level II (30160)  Patient Instructions: 1)  See Dr Lodema Hong for Ankle and leg swelling before the 13th. 2)  Use compression stockings daily.

## 2010-02-11 NOTE — Progress Notes (Signed)
Summary: please call patient's daughter  Phone Note Other Incoming   Summary of Call: Rayna Sexton, Manson Allan Sitts's daughter, left a message to please call her with an update on Mrs. Guarino office visit and the MRI results York Spaniel to call her at [phone] after 3:00 Initial call taken by: Jacklynn Ganong,  November 29, 2009 7:29 AM

## 2010-02-11 NOTE — Assessment & Plan Note (Signed)
Summary: post op #2 rt tka 09/24/09/[facility]   Visit Type:  Follow-up Referring Lizzeth Meder:  Orthopaedics-Dr. Romeo Apple Primary Chai Verdejo:  Dr. Syliva Overman  CC:  right knee replacement.  History of Present Illness: I saw Jennifer [name] in the office today for a 2 week  followup visit.  She is a 77 years old woman with the complaint of:  right knee replacement  DOS 09-24-09. Right total knee replacement.  rehabilitation:  [facility]  Meds:   Neurontin 300 mg TID, Ibuprofen 800 mg.  Currently residing at home with her daughter.  Still complains of bilateral anterior thigh pain and throbbing knee pain.  Her range of motion actively is 10-120 and passively 5-120.  I believe her thigh pain is from spinal stenosis I would like to do an MRI.  We will increase her Neurontin 300 mg 3 times a day     Allergies: 1)  ! Penicillin 2)  ! * Dye 3)  ! Prednisone (Pak) (Prednisone) 4)  Iron   Impression & Recommendations:  Problem # 1:  TOTAL KNEE FOLLOW-UP (ICD-V43.65) Assessment Improved  Orders: Post-Op Check (16109)  Problem # 2:  SPINAL STENOSIS (ICD-724.00) Assessment: Deteriorated  Orders: Est. Patient Level II (60454)  Patient Instructions: 1)  INCREASE NEURONTIN  TO 3 X A DAY  2)  RETURN IN 4 WEEKS    Orders Added: 1)  Post-Op Check [99024] 2)  Est. Patient Level II [09811]

## 2010-02-11 NOTE — Miscellaneous (Signed)
Summary: PT Clinical evaluation  PT Clinical evaluation   Imported By: Jacklynn Ganong 11/05/2009 11:16:44  _____________________________________________________________________  External Attachment:    Type:   Image     Comment:   External Document

## 2010-02-11 NOTE — Progress Notes (Signed)
Summary: results  Phone Note Call from Patient   Summary of Call: pt would like for doc to call her about x-ray that sdhe took. [phone]   Initial call taken by: Rudene Anda,  January 21, 2009 9:43 AM  Follow-up for Phone Call        advise alot of arthritis in back and no significant blockage in neck arteries Follow-up by: Syliva Overman MD,  January 21, 2009 10:44 AM  Additional Follow-up for Phone Call Additional follow up Details #1::        Phone Call Completed Additional Follow-up by: Worthy Keeler LPN,  January 21, 2009 11:03 AM

## 2010-02-11 NOTE — Miscellaneous (Signed)
Summary: esi order faxed to [facility]  Clinical Lists Changes

## 2010-02-11 NOTE — Progress Notes (Signed)
Summary: call from [facility] about Rx  Phone Note Other Incoming   Caller: [facility]  Summary of Call: Trula Ore from [facility] - ph [phone] called back to check on Rx prescribed 10/08/09,  Dosepak X12 days needs more information.  Asked to speak w/nurse. Initial call taken by: Cammie Sickle,  October 09, 2009 4:06 PM  Follow-up for Phone Call        advised Trula Ore, 10mg  sterapred dose pak as directed for 12 days Follow-up by: Ether Griffins,  October 09, 2009 4:31 PM

## 2010-02-11 NOTE — Medication Information (Signed)
Summary: Tax adviser   Imported By: Lind Guest 08/21/2009 11:04:27  _____________________________________________________________________  External Attachment:    Type:   Image     Comment:   External Document
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
## 2010-02-11 NOTE — Progress Notes (Signed)
Summary: Call from daughter from [place]  Phone Note Call from Patient   Caller: Daughter Summary of Call: Jennifer [name] daughter Jennifer [name] called, confirmed having received call from Dr Romeo Apple.  Also needed the contact information re: referral / ESI. I gave her [facility] phone # [phone]) and she will call and arrange appt + arrange transportation for her mom. Initial call taken by: Cammie Sickle,  December 04, 2009 2:30 PM

## 2010-02-11 NOTE — Miscellaneous (Signed)
Summary: bmp,cbc,lipids  Clinical Lists Changes  Observations: Added new observation of CALCIUM: 9.0 mg/dL (29/52/8413 24:40) Added new observation of ALBUMIN: 3.7 g/dL (11/08/2534 64:40) Added new observation of PROTEIN, TOT: 6.3 g/dL (34/74/2595 63:87) Added new observation of SGPT (ALT): 8 units/L (08/02/2009 10:50) Added new observation of SGOT (AST): 16 units/L (08/02/2009 10:50) Added new observation of ALK PHOS: 83 units/L (08/02/2009 10:50) Added new observation of BILI DIRECT: 0.1 mg/dL (56/43/3295 18:84) Added new observation of CREATININE: 0.69 mg/dL (16/60/6301 60:10) Added new observation of BUN: 20 mg/dL (93/23/5573 22:02) Added new observation of BG RANDOM: 80 mg/dL (54/27/0623 76:28) Added new observation of CO2 PLSM/SER: 28 meq/L (08/02/2009 10:50) Added new observation of CL SERUM: 106 meq/L (08/02/2009 10:50) Added new observation of K SERUM: 3.9 meq/L (08/02/2009 10:50) Added new observation of NA: 141 meq/L (08/02/2009 10:50) Added new observation of LDL: 74 mg/dL (31/51/7616 07:37) Added new observation of HDL: 64 mg/dL (10/62/6948 54:62) Added new observation of TRIGLYC TOT: 81 mg/dL (70/35/0093 81:82) Added new observation of CHOLESTEROL: 154 mg/dL (99/37/1696 78:93) Added new observation of PLATELETK/UL: 200 K/uL (08/02/2009 10:50) Added new observation of MCV: 99.5 fL (08/02/2009 10:50) Added new observation of HCT: 40.6 % (08/02/2009 10:50) Added new observation of HGB: 12.7 g/dL (81/01/7508 25:85) Added new observation of WBC COUNT: 4.2 10*3/microliter (08/02/2009 10:50) Added new observation of TSH: 1.174 microintl units/mL (08/02/2009 10:50)

## 2010-02-11 NOTE — Progress Notes (Signed)
Summary: Patient is having drainage at incision site  Phone Note From Other Clinic   Caller: [facility] Call For: Dr Romeo Apple Summary of Call: Patient is having drainage atthe incision site, yellowish and she hals has swelling and her knee is warm to touch. [phone] Initial call taken by: Waldon Reining,  September 30, 2009 10:56 AM  Follow-up for Phone Call        and...Marland KitchenMarland KitchenMarland Kitchen  wound problems in surgical patients hyave to be seen [I'll see her tomorrw] can't be handled over the phone  Follow-up by: Fuller Canada MD,  September 30, 2009 12:29 PM  Additional Follow-up for Phone Call Additional follow up Details #1::        I called back to [facility], and per Patsy,  they can have the transportation Zenaida Niece bring her today.   Additional Follow-up by: Cammie Sickle,  September 30, 2009 12:40 PM     Appended Document: Patient is having drainage at incision site seen treated see note
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
## 2010-02-11 NOTE — Assessment & Plan Note (Signed)
Summary: f up   Vital Signs:  Patient profile:   77 year old female Menstrual status:  hysterectomy Height:      64 inches Weight:      194 pounds BMI:     33.42 O2 Sat:      97 % Pulse rate:   94 / minute Pulse rhythm:   regular Resp:     16 per minute BP sitting:   120 / 68  (left arm) Cuff size:   large  Vitals Entered By: Everitt Amber LPN (June 18, 863 9:37 AM) CC: Follow up chronic problems   Primary Care Provider:  Dr. Syliva Overman  CC:  Follow up chronic problems.  History of Present Illness: Reports  thatshe has been doing fairly well, except for her right knee , which she plans to have surgery on in the Summer. Denies recent fever or chills. Denies sinus pressure, nasal congestion , ear pain or sore throat. Denies chest congestion, or cough productive of sputum. Denies chest pain, palpitations, PND, orthopnea or leg swelling.She recently went to Dr Juanetta Gosling with concerns about chest pain, and she has incidentally missed her appt with dtr Rothbrt who follows her heart, i am setting up a f/u forher. Denies abdominal pain, nausea, vomitting, diarrhea or constipation. Denies change in bowel movements or bloody stool. Denies dysuria , frequency, incontinence or hesitancy.  Denies headaches, vertigo, seizures. Denies depression, anxiety or insomnia. Denies  rash, lesions, or itch.     Current Medications (verified): 1)  Norco 5-325 Mg Tabs (Hydrocodone-Acetaminophen) .... One Tab By Mouth Bid 2)  Fish Oil 1000 Mg Caps (Omega-3 Fatty Acids) .... Take 1 Tablet By Mouth Once A Day 3)  Womens Multivitamin Plus  Tabs (Multiple Vitamins-Minerals) .... Take 1 Tablet By Mouth Once A Day 4)  Flonase 50 Mcg/act Susp (Fluticasone Propionate) .... One To Two Puffs Per Nostril Daily 5)  Nitrostat 0.4 Mg Subl (Nitroglycerin) .Marland Kitchen.. 1 Tablet Under Tongue At Onset of Chest Pain; You May Repeat Every 5 Minutes For Up To 3 Doses. 6)  Proair Hfa 108 (90 Base) Mcg/act Aers (Albuterol  Sulfate) .... Inhale 2 Puffs By Mouth Every 6 Hours Prn 7)  Ranitidine Hcl 150 Mg Caps (Ranitidine Hcl) .... Take 1 Capsule By Mouth Two Times A Day  Allergies (verified): 1)  ! Penicillin 2)  ! * Dye 3)  ! Prednisone (Pak) (Prednisone) 4)  Iron  Review of Systems      See HPI General:  Complains of fatigue; denies chills, fever, loss of appetite, malaise, sleep disorder, sweats, and weakness. Eyes:  Denies discharge and red eye. ENT:  Denies hoarseness, nasal congestion, and sinus pressure. CV:  Complains of chest pain or discomfort and shortness of breath with exertion; denies near fainting, palpitations, and swelling of feet; intermittent chest pain, non radiating, no aggravating or relieving factores noted. Marland Kitchen Resp:  Complains of cough, shortness of breath, and wheezing; denies sputum productive; continues to smoke on avg 7 to 10 ciggs/day. GI:  Denies abdominal pain, constipation, indigestion, loss of appetite, nausea, and vomiting. GU:  Denies dysuria, incontinence, and urinary frequency. MS:  Complains of joint pain and stiffness; upcoming knee surgery, replacemet planned. Neuro:  Complains of memory loss and poor balance; denies seizures and sensation of room spinning. Psych:  Complains of anxiety; denies depression, suicidal thoughts/plans, and thoughts of violence. Endo:  Denies excessive thirst and excessive urination. Heme:  Denies abnormal bruising and bleeding. Allergy:  Denies hives or rash and  itching eyes.  Physical Exam  General:  Well-developed,overweight,in no acute distress; alert,appropriate and cooperative throughout examination HEENT: No facial asymmetry,  EOMI, No sinus tenderness, TM's Clear, oropharynx  pink and moist.   Chest: decreased air entry, bilateral wheezes CVS: S1, S2, No murmurs, No S3.   Abd: Soft, nontender MS: decreased  ROM spine, hips, shoulders and knees.  Ext: No edema.   CNS: CN 2-12 intact, power tone and sensation normal throughout.    Skin: Intact, no visible lesions or rashes.  Psych: Good eye contact, normal affect.  Memory loss, mild, not anxious or depressed appearing.    Impression & Recommendations:  Problem # 1:  FATIGUE (ICD-780.79) Assessment Comment Only  Orders: T-Basic Metabolic Panel [phone]) T-CBC w/Diff [id number]) T-TSH [phone])  Problem # 2:  TOBACCO ABUSE (ICD-305.1) Assessment: Unchanged  Encouraged smoking cessation and discussed different methods for smoking cessation.   Problem # 3:  KNEE PAIN (VHQ-469.62) Assessment: Deteriorated  Her updated medication list for this problem includes:    Norco 5-325 Mg Tabs (Hydrocodone-acetaminophen) ..... One tab by mouth bid  Complete Medication List: 1)  Norco 5-325 Mg Tabs (Hydrocodone-acetaminophen) .... One tab by mouth bid 2)  Fish Oil 1000 Mg Caps (Omega-3 fatty acids) .... Take 1 tablet by mouth once a day 3)  Womens Multivitamin Plus Tabs (Multiple vitamins-minerals) .... Take 1 tablet by mouth once a day 4)  Flonase 50 Mcg/act Susp (Fluticasone propionate) .... One to two puffs per nostril daily 5)  Nitrostat 0.4 Mg Subl (Nitroglycerin) .Marland Kitchen.. 1 tablet under tongue at onset of chest pain; you may repeat every 5 minutes for up to 3 doses. 6)  Proair Hfa 108 (90 Base) Mcg/act Aers (Albuterol sulfate) .... Inhale 2 puffs by mouth every 6 hours prn 7)  Ranitidine Hcl 150 Mg Caps (Ranitidine hcl) .... Take 1 capsule by mouth two times a day  Other Orders: T-Hepatic Function [phone]) T-Lipid Profile [phone]) Cardiology Referral (Cardiology) T-Vitamin D (25-Hydroxy) [phone])  Patient Instructions: 1)  f/u in 6 weeks. 2)  you will be referred to dr. Dietrich Pates for cardiology f/u, and to do evaluation for right knee replacement. 3)  BMP prior to visit, ICD-9: 4)  Hepatic Panel prior to visit, ICD-9: 5)  Lipid Panel prior to visit, ICD-9:n  fasting 6)  TSH prior to visit, ICD-9: 7)  CBC w/ Diff prior to visit,  ICD-9: 8)  vitamin d 9)  you absolutely need to sTOP smoking cigarretes, they are damaging to your health

## 2010-02-11 NOTE — Assessment & Plan Note (Signed)
Summary: KNEE PAIN/REQ INJECTION/MEDICARE/CAF   Visit Type:  Follow-up Referring Provider:  self Primary Provider:  Dr. Syliva Overman  CC:  knee pain.  History of Present Illness: I saw Jennifer [name] in the office today for a followup visit.  She is a 77 years old woman with the complaint of:  knee pain.  Requesting injection.  Allergies: 1)  ! Penicillin 2)  ! * Dye 3)  ! Prednisone (Pak) (Prednisone) 4)  Iron   Impression & Recommendations:  Problem # 1:  DEGENERATIVE JOINT DISEASE, BOTH KNEES, SEVERE (ICD-715.96) Assessment Deteriorated  RIGHT knee joint injection  Verbal consent was obtained. The knee was prepped with alcohol and ethyl chloride. 1 cc of depomedrol 40mg /cc and 4 cc of lidocaine 1% was injected. there were no complications.  Her updated medication list for this problem includes:    Norco 5-325 Mg Tabs (Hydrocodone-acetaminophen) ..... One tab by mouth bid  Orders: Joint Aspirate / Injection, Large (20610) Depo- Medrol 40mg  (J1030)  Patient Instructions: 1)  You have received an injection of cortisone today. You may experience increased pain at the injection site. Apply ice pack to the area for 20 minutes every 2 hours and take 2 xtra strength tylenol every 8 hours. This increased pain will usually resolve in 24 hours. The injection will take effect in 3-10 days.  2)  Please schedule a follow-up appointment as needed.

## 2010-02-11 NOTE — Assessment & Plan Note (Signed)
Summary: office visit   Vital Signs:  Patient profile:   77 year old female Menstrual status:  hysterectomy Height:      64 inches Weight:      199 pounds BMI:     34.28 O2 Sat:      98 % Pulse rate:   73 / minute Resp:     16 per minute BP sitting:   124 / 80  (left arm)  Vitals Entered By: Everitt Amber LPN (August 28, 2009 10:31 AM) CC: her knees are still hurting her bad, both of them and the pain meds she takes isn't helping   Primary Care Provider:  Dr. Syliva Overman  CC:  her knees are still hurting her bad and both of them and the pain meds she takes isn't helping.  History of Present Illness: redness and swelling around left ankle  for 1 week, also states her pain is out of control hoping to get the knee replacement  next month.She states hher pain meds are no lonmger working and feels unstable. She has appt with ortho next week. Reports  that otherwise she is fair. She is trying to stop smoking. Denies recent fever or chills. Denies sinus pressure, nasal congestion , ear pain or sore throat. Denies chest congestion, or cough productive of sputum.She does ahve a chronic smoker's cough. Denies chest pain, palpitations, PND, orthopnea or leg swelling. Denies abdominal pain, nausea, vomitting, diarrhea or constipation. Denies change in bowel movements or bloody stool. Denies dysuria , frequency, incontinence or hesitancy.  Denies headaches, vertigo, seizures. Denies depression, anxiety or insomnia.      Preventive Screening-Counseling & Management  Alcohol-Tobacco     Smoking Cessation Counseling: yes  Current Medications (verified): 1)  Norco 5-325 Mg Tabs (Hydrocodone-Acetaminophen) .... One Tab By Mouth Bid 2)  Fish Oil 1000 Mg Caps (Omega-3 Fatty Acids) .... Take 1 Tablet By Mouth Two Times A Day 3)  Womens Multivitamin Plus  Tabs (Multiple Vitamins-Minerals) .... Take 1 Tablet By Mouth Once A Day 4)  Nitrostat 0.4 Mg Subl (Nitroglycerin) .Marland Kitchen.. 1 Tablet  Under Tongue At Onset of Chest Pain; You May Repeat Every 5 Minutes For Up To 3 Doses. 5)  Ranitidine Hcl 150 Mg Caps (Ranitidine Hcl) .... Take 1 Capsule By Mouth Two Times A Day 6)  Focus Factor .... Take 1 Tablet By Mouth Once A Day 7)  Ra Arthritis Pain Relief 650 Mg Cr-Tabs (Acetaminophen) .... Take As Needed For Pain 8)  Aleve 220 Mg Tabs (Naproxen Sodium) .... Take As Needed 9)  Sm Stool Softener 100 Mg Caps (Docusate Sodium) .... Use As Needed 10)  Ex-Lax 15 Mg Chew (Sennosides) .... Use As Needed 11)  Pravastatin Sodium 40 Mg Tabs (Pravastatin Sodium) .... Take One Tablet By Mouth Daily At Bedtime 12)  Proair Hfa 108 (90 Base) Mcg/act Aers (Albuterol Sulfate) .... 2 Puffs Every 6 To 8 Hours As Needed  Allergies (verified): 1)  ! Penicillin 2)  ! * Dye 3)  ! Prednisone (Pak) (Prednisone) 4)  Iron  Review of Systems      See HPI General:  Complains of fatigue; denies chills and fever. Eyes:  Denies discharge, eye pain, and red eye. MS:  Complains of joint pain and stiffness. Derm:  Complains of itching, lesion(s), and rash; left ankle. Endo:  Denies cold intolerance, excessive thirst, excessive urination, and heat intolerance. Heme:  Denies abnormal bruising and bleeding. Allergy:  Complains of seasonal allergies; denies hives or rash and itching eyes.  Physical Exam  General:  Well-developed,overweight,in no acute distress; alert,appropriate and cooperative throughout examination HEENT: No facial asymmetry,  EOMI, No sinus tenderness, TM's Clear, oropharynx  pink and moist.   Chest: decreased air entry, no wheezes, no crackles CVS: S1, S2, No murmurs, No S3.   Abd: Soft, nontender MS: decreased  ROM spine, hips, shoulders and knees.  Ext: No edema.   CNS: CN 2-12 intact, power tone and sensation normal throughout.   Skin: erythema and warmth around left ankle, no purlent drainage, mild tenderness Psych: Good eye contact, normal affect.  Memory loss, mild, not anxious or  depressed appearing.    Impression & Recommendations:  Problem # 1:  CELLULITIS AND ABSCESS OF UNSPECIFIED SITE (ICD-682.9) Assessment Comment Only  Her updated medication list for this problem includes:    Sulfamethoxazole-tmp Ds 800-160 Mg Tabs (Sulfamethoxazole-trimethoprim) .Marland Kitchen... Take 1 tablet by mouth two times a day  Problem # 2:  DEGENERATIVE JOINT DISEASE, RIGHT KNEE (ICD-715.96) Assessment: Deteriorated  Her updated medication list for this problem includes:    Norco 5-325 Mg Tabs (Hydrocodone-acetaminophen) ..... One tab by mouth bid    Ra Arthritis Pain Relief 650 Mg Cr-tabs (Acetaminophen) .Marland Kitchen... Take as needed for pain    Aleve 220 Mg Tabs (Naproxen sodium) .Marland Kitchen... Take as needed  Problem # 3:  TOBACCO ABUSE (ICD-305.1) Assessment: Unchanged  Encouraged smoking cessation and discussed different methods for smoking cessation.   Problem # 4:  COPD (ICD-496) Assessment: Deteriorated  Her updated medication list for this problem includes:    Proair Hfa 108 (90 Base) Mcg/act Aers (Albuterol sulfate) .Marland Kitchen... 2 puffs every 6 to 8 hours as needed  Problem # 5:  KNEE PAIN (ZOX-096.04) Assessment: Deteriorated  Her updated medication list for this problem includes:    Norco 5-325 Mg Tabs (Hydrocodone-acetaminophen) ..... One tab by mouth bid    Ra Arthritis Pain Relief 650 Mg Cr-tabs (Acetaminophen) .Marland Kitchen... Take as needed for pain    Aleve 220 Mg Tabs (Naproxen sodium) .Marland Kitchen... Take as needed  Orders: Ketorolac-Toradol 15mg  [phone]) Admin of Therapeutic Inj  intramuscular or subcutaneous (11914)  Complete Medication List: 1)  Norco 5-325 Mg Tabs (Hydrocodone-acetaminophen) .... One tab by mouth bid 2)  Fish Oil 1000 Mg Caps (Omega-3 fatty acids) .... Take 1 tablet by mouth two times a day 3)  Womens Multivitamin Plus Tabs (Multiple vitamins-minerals) .... Take 1 tablet by mouth once a day 4)  Nitrostat 0.4 Mg Subl (Nitroglycerin) .Marland Kitchen.. 1 tablet under tongue at onset of chest  pain; you may repeat every 5 minutes for up to 3 doses. 5)  Ranitidine Hcl 150 Mg Caps (Ranitidine hcl) .... Take 1 capsule by mouth two times a day 6)  Focus Factor  .... Take 1 tablet by mouth once a day 7)  Ra Arthritis Pain Relief 650 Mg Cr-tabs (Acetaminophen) .... Take as needed for pain 8)  Aleve 220 Mg Tabs (Naproxen sodium) .... Take as needed 9)  Sm Stool Softener 100 Mg Caps (Docusate sodium) .... Use as needed 10)  Ex-lax 15 Mg Chew (Sennosides) .... Use as needed 11)  Pravastatin Sodium 40 Mg Tabs (Pravastatin sodium) .... Take one tablet by mouth daily at bedtime 12)  Proair Hfa 108 (90 Base) Mcg/act Aers (Albuterol sulfate) .... 2 puffs every 6 to 8 hours as needed 13)  Sulfamethoxazole-tmp Ds 800-160 Mg Tabs (Sulfamethoxazole-trimethoprim) .... Take 1 tablet by mouth two times a day  Patient Instructions: 1)  Please schedule a follow-up appointment in 3.5 2)  months. 3)  Tobacco is very bad for your health and your loved ones! You Should stop smoking!. 4)  Stop Smoking Tips: Choose a Quit date. Cut down before the Quit date. decide what you will do as a substitute when you feel the urge to smoke(gum,toothpick,exercise). 5)  Med is sent in for cellulitis around the ankle Prescriptions: SULFAMETHOXAZOLE-TMP DS 800-160 MG TABS (SULFAMETHOXAZOLE-TRIMETHOPRIM) Take 1 tablet by mouth two times a day  #10 x 0   Entered and Authorized by:   Syliva Overman MD   Signed by:   Syliva Overman MD on 08/28/2009   Method used:   Electronically to        [facility] [address] [phone]* (retail)       [address] [place], [place]  [address]       Ph: [phone]       Fax: [phone]   RxID:   [id number]    Medication Administration  Injection # 1:    Medication: Ketorolac-Toradol 15mg     Diagnosis: KNEE PAIN (ICD-719.46)    Route: IM    Site: RUOQ gluteus    Exp Date: 03/2011    Lot #: [id number]     Mfr: novaplus    Comments: 60mg  given     Patient tolerated  injection without complications    Given by: Everitt Amber LPN (August 28, 2009 11:16 AM)  Orders Added: 1)  Est. Patient Level IV [25366] 2)  Ketorolac-Toradol 15mg  [J1885] 3)  Admin of Therapeutic Inj  intramuscular or subcutaneous [44034]

## 2010-02-11 NOTE — Letter (Signed)
Summary: Letter TO DR. Tama Gander  Letter TO DR. Tama Gander   Imported By: Lind Guest 06/17/2009 13:41:43  _____________________________________________________________________  External Attachment:    Type:   Image     Comment:   External Document

## 2010-02-11 NOTE — Progress Notes (Signed)
Summary: DR. Juanetta Gosling  DR. HAWKINS   Imported By: Lind Guest 06/07/2009 08:26:15  _____________________________________________________________________  External Attachment:    Type:   Image     Comment:   External Document

## 2010-02-11 NOTE — Miscellaneous (Signed)
  called left cell #   Clinical Lists Changes

## 2010-02-13 NOTE — Assessment & Plan Note (Signed)
Summary: 1 M RE-CHECK/TKA FOL/UP/NO XRAY THIS VST PER DR H/MEDICARE,[facility]...   Referring Provider:  Orthopaedics-Dr. Romeo Apple Primary Provider:  Dr. Syliva Overman   History of Present Illness:  DOS September 24 2009 Procedure RIGHT total knee arthroplasty Medication ibuprofen 800, Tylenol, Neurontin.  ROS: SPINAL STENOSIS   Recommended epidural injections, has not gotten those yet.   She goes for shot at 2:45 today.  Today is one month recheck after PT.  She is leaving for [place] [employer].  she tells [place] that she has 10 out of 10 pain occasionally, but not constantly. She is no longer on the Neurontin.  Her flexion is about 125 she has a 10 flexion contracture. Her incision looks good.  She will come back in a month unless they can't get back here. We will see her within a month or 2. I think she is progressing well concerned, spinal stenosis. The flexion contracture, that she had in the knee, and, overall health.         Allergies: 1)  ! Penicillin 2)  ! * Dye 3)  ! Prednisone (Pak) (Prednisone) 4)  Iron   Other Orders: Post-Op Check (24401)  Patient Instructions: 1)  Have a safe trip 2)  I sent refill for Gabapentin to [facility] 3)  Continue therapy in [place], order given. 4)  Please schedule a follow-up appointment in 1 month. Prescriptions: GABAPENTIN 300 MG CAPS (GABAPENTIN) Take 1 tab by mouth at bedtime  #90 x 3   Entered and Authorized by:   Fuller Canada MD   Signed by:   Fuller Canada MD on 12/24/2009   Method used:   Faxed to ...       [facility] [address] [phone]* (retail)       [address] [place], [place]  [address]       Ph: [phone]       Fax: [phone]   RxID:   [id number]    Orders Added: 1)  Post-Op Check [phone]

## 2010-02-13 NOTE — Miscellaneous (Signed)
Summary: PT order  PT order   Imported By: Cammie Sickle 12/24/2009 09:37:25  _____________________________________________________________________  External Attachment:    Type:   Image     Comment:   External Document

## 2010-02-13 NOTE — Progress Notes (Signed)
Summary: call from patient's daughter + authorization form signed  Phone Note Call from Patient   Caller: Daughter Summary of Call: Patient's daughter and Melina Modena, [name], ph [phone], has been back staying with patient and plans to take her to [place] for the winter, as of Wed, 12/25/09.  Authorization signed for copies of recent office, operative notes  Her appointment here is tomorrow. Physical therapy and ESI appointments are also tomorrow, 12/24/09. Initial call taken by: Cammie Sickle,  December 23, 2009 2:00 PM

## 2010-02-13 NOTE — Letter (Signed)
Summary: 1st missed letter  1st missed letter   Imported By: Lind Guest 12/27/2009 14:19:56  _____________________________________________________________________  External Attachment:    Type:   Image     Comment:   External Document

## 2010-02-13 NOTE — Miscellaneous (Signed)
Summary: Home health face to face form  Home health face to face form   Imported By: Jacklynn Ganong 12/23/2009 14:50:14  _____________________________________________________________________  External Attachment:    Type:   Image     Comment:   External Document

## 2010-02-13 NOTE — Assessment & Plan Note (Signed)
Summary: 1 M RE-CK/MEDICARE/CAF   Visit Type:  Follow-up Referring Provider:  Orthopaedics-Dr. Romeo Apple Primary Provider:  Dr. Syliva Overman  CC:  recheck TKA.  History of Present Illness:  DOS September 24 2009  Procedure RIGHT total knee arthroplasty  Medication ibuprofen 800, Tylenol helps pain, Neurontin 300mg  at night, Multivitamin, Focus Factor, Stool softener, ex lax.  ROS: SPINAL STENOSIS   Today is one month recheck on knee after moving to [place] and having PT, did not have therapy in [place].  Her ROM is good  She has been back in [place] for 4 weeks now.  She is using cane today.  Had one ESI in December, made back worse, the right leg is better the right still aches   Pain level today is 10 with both legs and back.she doesn't manifest this clinically              Allergies: 1)  ! Penicillin 2)  ! * Dye 3)  ! Prednisone (Pak) (Prednisone) 4)  Iron  Physical Exam  Additional Exam:   RIGHT knee. I am very happy with the progress regarding her RIGHT knee. She has flexion of 120 and has -5 of extension. She has some discoloration at the distal tibia, and we've advised some cocoa butter.  As far as her back goes, we are still having difficulty with her spinal stenosis. She's not a surgical candidate. She says the Tylenol makes things better, so I've advised her to take it more frequently.   Impression & Recommendations:  Problem # 1:  TOTAL KNEE FOLLOW-UP (ICD-V43.65)  Orders: Est. Patient Level II (81191)  Problem # 2:  SPINAL STENOSIS (ICD-724.00)  Orders: Est. Patient Level II (47829)  Patient Instructions: 1)  TYLENOL 2 TAB (500 MG) EVERY 6 HOURS  2)  CONTINUE IBUPROFEN TWICE A DAY 3)  APPLY PALMERS COCOA BUTTER TO RIGHT LEG 3 TIMES A DAY 4)    5)  Please schedule a follow-up appointment in 2 months.   Orders Added: 1)  Est. Patient Level II [56213]

## 2010-02-13 NOTE — Letter (Signed)
Summary: [facility] Nurse instructions  [place]  [address]   [place], [place] [address]   Phone: [phone]  Fax: [phone]    Patient Information  For [facility] Jennifer [name]         Your current medications include:  1)  * FOCUS FACTOR Take 1 tablet by mouth once a day 2)  * MULTIVITAMIN 1 tab daily 3)  GABAPENTIN 300 MG CAPS (GABAPENTIN) Take 1 tab by mouth at bedtime 4)  IBUPROFEN 800 MG TABS (IBUPROFEN) Take 1 tablet by mouth two times a day 5)  COMBIVENT 18-103 MCG/ACT AERO (IPRATROPIUM-ALBUTEROL) 2 puffs every 6 to 8 hours as needed     Please contact us at_________________________ if you have any questions or concerns.

## 2010-02-13 NOTE — Miscellaneous (Signed)
Summary: PT progress note  PT progress note   Imported By: Jacklynn Ganong 12/23/2009 14:50:47  _____________________________________________________________________  External Attachment:    Type:   Image     Comment:   External Document

## 2010-02-13 NOTE — Miscellaneous (Signed)
Summary: Rehab Report  Rehab Report   Imported By: Cammie Sickle 12/27/2009 10:34:22  _____________________________________________________________________  External Attachment:    Type:   Image     Comment:   External Document

## 2010-02-19 NOTE — Assessment & Plan Note (Signed)
Summary: PAINS IN BACK   Vital Signs:  Patient profile:   77 year old female Menstrual status:  hysterectomy Height:      64 inches Weight:      201.50 pounds BMI:     34.71 O2 Sat:      97 % Pulse rate:   99 / minute Pulse rhythm:   regular Resp:     16 per minute BP sitting:   140 / 84  (left arm) Cuff size:   large  Vitals Entered By: Everitt Amber LPN (February 10, 2010 9:57 AM) CC: Follow up chronic problems, both knees and back hurting, no appetite, states she has been swelling and has never had that before Pain Assessment Patient in pain? yes     Location: lower back  Intensity: 10 Type: aching  Onset of pain  for 2 months   Primary Care Provider:  Dr. Syliva Overman  CC:  Follow up chronic problems, both knees and back hurting, no appetite, and states she has been swelling and has never had that before.  History of Present Illness: Pt reports back pain "ever since she had epidural injection" done approx 2 weeks ago at the recommenndation of her orthopodedic doc. she also c/o swelling, though there is no evidence of fluid retentionon exam. She is experiencing increased urinary incontinence, but will try strengthening exercises, and also work on a regime for regular scheduled urination. she is trying to quit smokin, states in the lenten season she generally does.  Preventive Screening-Counseling & Management  Alcohol-Tobacco     Smoking Cessation Counseling: yes  Current Medications (verified): 1)  Focus Factor .... Take 1 Tablet By Mouth Once A Day 2)  Multivitamin .Marland Kitchen.. 1 Tab Daily 3)  Gabapentin 300 Mg Caps (Gabapentin) .... Take 1 Tab By Mouth At Bedtime 4)  Ibuprofen 800 Mg Tabs (Ibuprofen) .... Take 1 Tablet By Mouth Two Times A Day 5)  Proair Hfa 108 (90 Base) Mcg/act Aers (Albuterol Sulfate) .... 2 Puffs Twice Daily As Needed  Allergies (verified): 1)  ! Penicillin 2)  ! * Dye 3)  ! Prednisone (Pak) (Prednisone) 4)  Iron  Review of Systems  See HPI General:  Complains of fatigue. Eyes:  Complains of vision loss-both eyes. CV:  Denies chest pain or discomfort and palpitations. Resp:  Complains of cough and shortness of breath; denies sputum productive and wheezing. GI:  Denies abdominal pain, constipation, diarrhea, nausea, and vomiting. GU:  Complains of incontinence. MS:  Complains of joint pain, low back pain, mid back pain, and stiffness. Neuro:  Complains of memory loss. Psych:  Complains of anxiety; denies depression, mental problems, suicidal thoughts/plans, thoughts of violence, and unusual visions or sounds. Endo:  Denies cold intolerance, excessive hunger, excessive thirst, and excessive urination. Heme:  Denies abnormal bruising and bleeding. Allergy:  Complains of seasonal allergies.  Physical Exam  General:  Well-developed,well-nourished,in no acute distress; alert,appropriate and cooperative throughout examination HEENT: No facial asymmetry,  EOMI, No sinus tenderness, TM's Clear, oropharynx  pink and moist.   Chest: Clear to auscultation bilaterally.  CVS: S1, S2, No murmurs, No S3.   Abd: Soft, Nontender.  MS: decreased ROM spine,and knees.  Ext: no  edema.   CNS: CN 2-12 intact, power tone and sensation normal throughout.   Skin: no skin breakdown or infection noted Psych: Good eye contact, normal affect.  Memory loss, mild not anxious or depressed appearing.    Impression & Recommendations:  Problem # 1:  BACK PAIN (  ICD-724.5) Assessment Deteriorated  Her updated medication list for this problem includes:    Ibuprofen 800 Mg Tabs (Ibuprofen) .Marland Kitchen... Take 1 tablet by mouth two times a day  Orders: Depo- Medrol 40mg  (J1030) Ketorolac-Toradol 15mg  (A4166) Admin of Therapeutic Inj  intramuscular or subcutaneous (06301) Medicare Electronic Prescription (S0109)  Problem # 2:  TOBACCO ABUSE (ICD-305.1) Assessment: Unchanged  Encouraged smoking cessation and discussed different methods for smoking  cessation.   Problem # 3:  COPD (ICD-496) Assessment: Deteriorated  Her updated medication list for this problem includes:    Proair Hfa 108 (90 Base) Mcg/act Aers (Albuterol sulfate) .Marland Kitchen... 2 puffs twice daily as needed  Pulmonary Functions Reviewed: O2 sat: 97 (02/10/2010)     Vaccines Reviewed: Pneumovax: Pneumovax (Medicare) (10/31/2007)   Flu Vax: Fluvax MCR (09/12/2009)  Complete Medication List: 1)  Focus Factor  .... Take 1 tablet by mouth once a day 2)  Multivitamin  .Marland Kitchen.. 1 tab daily 3)  Gabapentin 300 Mg Caps (Gabapentin) .... Take 1 tab by mouth at bedtime 4)  Ibuprofen 800 Mg Tabs (Ibuprofen) .... Take 1 tablet by mouth two times a day 5)  Proair Hfa 108 (90 Base) Mcg/act Aers (Albuterol sulfate) .... 2 puffs twice daily as needed  Patient Instructions: 1)  Please schedule a follow-up appointment in 3 months. 2)  Tobacco is very bad for your health and your loved ones! You Should stop smoking!. 3)  Stop Smoking Tips: Choose a Quit date. Cut down before the Quit date. decide what you will do as a substitute when you feel the urge to smoke(gum,toothpick,exercise). 4)  You need to lose weight. Consider a lower calorie diet and regular exercise.  5)  You will get injections today for your back, Depomedrol 40mg  and toradol 30mg  IM 6)  pls practice strengthening exercises for your urine, if it gets too bad then med may be sent in for help. Prescriptions: IBUPROFEN 800 MG TABS (IBUPROFEN) Take 1 tablet by mouth two times a day  #60 Tablet x 3   Entered by:   Adella Hare LPN   Authorized by:   Syliva Overman MD   Signed by:   Adella Hare LPN on 32/35/5732   Method used:   Electronically to        [facility] [address] [phone]* (retail)       [address] [place], [place]  [address]       Ph: [phone]       Fax: [phone]   RxID:   [id number]    Medication Administration  Injection # 1:    Medication: Depo- Medrol 40mg     Diagnosis: BACK PAIN  (ICD-724.5)    Route: IM    Site: RUOQ gluteus    Exp Date: 01/13    Lot #: Terrial [id number]    Mfr: Pharmacia    Patient tolerated injection without complications    Given by: Adella Hare LPN (February 10, 2010 11:22 AM)  Injection # 2:    Medication: Ketorolac-Toradol 15mg     Diagnosis: BACK PAIN (ICD-724.5)    Route: IM    Site: LUOQ gluteus    Exp Date: 01/13/2011    Lot #: [id number]    Mfr: NOVAPLUS    Comments: TORADOL 54M GIVEN    Patient tolerated injection without complications    Given by: Adella Hare LPN (February 10, 2010 11:24 AM)  Orders Added: 1)  Est. Patient Level IV [37169] 2)  Depo- Medrol 40mg  [  J1030] 3)  Ketorolac-Toradol 15mg  [J1885] 4)  Admin of Therapeutic Inj  intramuscular or subcutaneous [96372] 5)  Medicare Electronic Prescription [G8553]     Medication Administration  Injection # 1:    Medication: Depo- Medrol 40mg     Diagnosis: BACK PAIN (ICD-724.5)    Route: IM    Site: RUOQ gluteus    Exp Date: 01/13    Lot #: Terrial Rhodes    Mfr: Pharmacia    Patient tolerated injection without complications    Given by: Adella Hare LPN (February 10, 2010 11:22 AM)  Injection # 2:    Medication: Ketorolac-Toradol 15mg     Diagnosis: BACK PAIN (ICD-724.5)    Route: IM    Site: LUOQ gluteus    Exp Date: 01/13/2011    Lot #: [id number]    Mfr: NOVAPLUS    Comments: TORADOL 70M GIVEN    Patient tolerated injection without complications    Given by: Adella Hare LPN (February 10, 2010 11:24 AM)  Orders Added: 1)  Est. Patient Level IV [11914] 2)  Depo- Medrol 40mg  [J1030] 3)  Ketorolac-Toradol 15mg  [J1885] 4)  Admin of Therapeutic Inj  intramuscular or subcutaneous [96372] 5)  Medicare Electronic Prescription [N8295]

## 2010-03-27 LAB — CBC
HCT: 25.7 % — ABNORMAL LOW (ref 36.0–46.0)
Hemoglobin: 8.8 g/dL — ABNORMAL LOW (ref 12.0–15.0)
Hemoglobin: 10.4 g/dL — ABNORMAL LOW (ref 12.0–15.0)
Hemoglobin: 12.6 g/dL (ref 12.0–15.0)
MCH: 32.2 pg (ref 26.0–34.0)
MCH: 31.7 pg (ref 26.0–34.0)
MCH: 32 pg (ref 26.0–34.0)
MCHC: 34 g/dL (ref 30.0–36.0)
MCHC: 33 g/dL (ref 30.0–36.0)
MCHC: 33.4 g/dL (ref 30.0–36.0)
MCV: 94.7 fL (ref 78.0–100.0)
MCV: 96.1 fL (ref 78.0–100.0)
Platelets: DECREASED 10*3/uL (ref 150–400)
Platelets: 107 10*3/uL — ABNORMAL LOW (ref 150–400)
Platelets: 180 10*3/uL (ref 150–400)
RBC: 3.01 MIL/uL — ABNORMAL LOW (ref 3.87–5.11)
RBC: 3.98 MIL/uL (ref 3.87–5.11)
RDW: 14.3 % (ref 11.5–15.5)
RDW: 14.3 % (ref 11.5–15.5)
WBC: 8.1 10*3/uL (ref 4.0–10.5)

## 2010-03-27 LAB — BASIC METABOLIC PANEL
BUN: 8 mg/dL (ref 6–23)
BUN: 14 mg/dL (ref 6–23)
CO2: 26 mEq/L (ref 19–32)
CO2: 28 mEq/L (ref 19–32)
CO2: 30 mEq/L (ref 19–32)
Calcium: 7.9 mg/dL — ABNORMAL LOW (ref 8.4–10.5)
Calcium: 8.1 mg/dL — ABNORMAL LOW (ref 8.4–10.5)
Calcium: 8.9 mg/dL (ref 8.4–10.5)
Chloride: 104 mEq/L (ref 96–112)
Chloride: 105 mEq/L (ref 96–112)
Creatinine, Ser: 0.64 mg/dL (ref 0.4–1.2)
Creatinine, Ser: 0.63 mg/dL (ref 0.4–1.2)
Creatinine, Ser: 0.81 mg/dL (ref 0.4–1.2)
GFR calc Af Amer: >60 mL/min (ref >60)
GFR calc Af Amer: >60 mL/min (ref >60)
GFR calc non Af Amer: >60 mL/min (ref >60)
GFR calc non Af Amer: >60 mL/min (ref >60)
Glucose, Bld: 106 mg/dL — ABNORMAL HIGH (ref 70–99)
Glucose, Bld: 105 mg/dL — ABNORMAL HIGH (ref 70–99)
Glucose, Bld: 72 mg/dL (ref 70–99)
Potassium: 3.6 mEq/L (ref 3.5–5.1)
Sodium: 136 mEq/L (ref 135–145)

## 2010-03-27 LAB — DIFFERENTIAL
Basophils Absolute: 0 10*3/uL (ref 0.0–0.1)
Basophils Absolute: 0 10*3/uL (ref 0.0–0.1)
Basophils Relative: 0 % (ref 0–1)
Basophils Relative: 0 % (ref 0–1)
Basophils Relative: 1 % (ref 0–1)
Eosinophils Absolute: 0.1 10*3/uL (ref 0.0–0.7)
Eosinophils Absolute: 0.2 10*3/uL (ref 0.0–0.7)
Eosinophils Absolute: 0.3 10*3/uL (ref 0.0–0.7)
Eosinophils Relative: 0 % (ref 0–5)
Eosinophils Relative: 8 % — ABNORMAL HIGH (ref 0–5)
Lymphocytes Relative: 14 % (ref 12–46)
Lymphs Abs: 1.1 10*3/uL (ref 0.7–4.0)
Lymphs Abs: 1.3 10*3/uL (ref 0.7–4.0)
Monocytes Absolute: 0.7 10*3/uL (ref 0.1–1.0)
Monocytes Relative: 8 % (ref 3–12)
Monocytes Relative: 11 % (ref 3–12)
Monocytes Relative: 9 % (ref 3–12)
Neutrophils Relative %: 74 % (ref 43–77)
Neutrophils Relative %: 52 % (ref 43–77)
Neutrophils Relative %: 72 % (ref 43–77)

## 2010-03-27 LAB — CROSSMATCH: ABO/RH(D): O POS

## 2010-03-27 LAB — PROTIME-INR
INR: 1.59 — ABNORMAL HIGH (ref 0.00–1.49)
INR: 1.06 (ref 0.00–1.49)
INR: 1.11 (ref 0.00–1.49)
Prothrombin Time: 19.1 seconds — ABNORMAL HIGH (ref 11.6–15.2)

## 2010-03-27 LAB — MRSA CULTURE

## 2010-03-27 LAB — SURGICAL PCR SCREEN

## 2010-03-31 ENCOUNTER — Encounter: Payer: Self-pay | Admitting: Orthopedic Surgery

## 2010-04-02 ENCOUNTER — Ambulatory Visit: Payer: Self-pay | Admitting: Orthopedic Surgery

## 2010-04-02 ENCOUNTER — Encounter: Payer: Self-pay | Admitting: Orthopedic Surgery

## 2010-04-14 LAB — CBC
MCHC: 32.7 g/dL (ref 30.0–36.0)
MCV: 95.9 fL (ref 78.0–100.0)
Platelets: 186 10*3/uL (ref 150–400)
WBC: 4.3 10*3/uL (ref 4.0–10.5)

## 2010-04-14 LAB — POCT CARDIAC MARKERS: Myoglobin, poc: 81.3 ng/mL (ref 12–200)

## 2010-04-14 LAB — BASIC METABOLIC PANEL
BUN: 15 mg/dL (ref 6–23)
CO2: 31 mEq/L (ref 19–32)
Calcium: 8.6 mg/dL (ref 8.4–10.5)
Chloride: 105 mEq/L (ref 96–112)
Creatinine, Ser: 0.75 mg/dL (ref 0.4–1.2)

## 2010-04-14 LAB — DIFFERENTIAL
Basophils Relative: 0 % (ref 0–1)
Eosinophils Absolute: 0.3 10*3/uL (ref 0.0–0.7)
Neutrophils Relative %: 39 % — ABNORMAL LOW (ref 43–77)

## 2010-05-06 ENCOUNTER — Encounter: Payer: Self-pay | Admitting: Family Medicine

## 2010-05-09 ENCOUNTER — Encounter: Payer: Self-pay | Admitting: Family Medicine

## 2010-05-12 ENCOUNTER — Ambulatory Visit: Payer: Self-pay | Admitting: Family Medicine

## 2010-05-13 ENCOUNTER — Encounter: Payer: Self-pay | Admitting: Family Medicine

## 2010-05-14 ENCOUNTER — Encounter: Payer: Self-pay | Admitting: Family Medicine

## 2010-05-14 ENCOUNTER — Ambulatory Visit (INDEPENDENT_AMBULATORY_CARE_PROVIDER_SITE_OTHER): Payer: Medicare Other | Admitting: Family Medicine

## 2010-05-14 VITALS — BP 160/90 | HR 86 | Resp 16 | Ht 64.75 in | Wt 202.0 lb

## 2010-05-14 DIAGNOSIS — I10 Essential (primary) hypertension: Secondary | ICD-10-CM

## 2010-05-14 DIAGNOSIS — F17200 Nicotine dependence, unspecified, uncomplicated: Secondary | ICD-10-CM

## 2010-05-14 DIAGNOSIS — M1990 Unspecified osteoarthritis, unspecified site: Secondary | ICD-10-CM

## 2010-05-14 DIAGNOSIS — J449 Chronic obstructive pulmonary disease, unspecified: Secondary | ICD-10-CM

## 2010-05-14 MED ORDER — BENAZEPRIL-HYDROCHLOROTHIAZIDE 5-6.25 MG PO TABS: 1.0000 | ORAL_TABLET | Freq: Every day | ORAL | Status: DC

## 2010-05-14 MED ORDER — IBUPROFEN 800 MG PO TABS: 800.0000 mg | ORAL_TABLET | Freq: Two times a day (BID) | ORAL | Status: DC

## 2010-05-14 NOTE — Progress Notes (Signed)
  Subjective:    Patient ID: Jennifer [name], female    DOB: 10-Nov-1922, 77 y.o.   MRN: [id number]  HPI C/o back pain ever since she got injections in her back, also continued stiffnes in the knees. She has not fallen and still lives independently. Reports poor appetite, though she has no weight loss    Review of Systems Denies recent fever or chills. Denies sinus pressure, nasal congestion, ear pain or sore throat. Denies chest congestion, productive cough or wheezing. Denies chest pains, palpitations, paroxysmal nocturnal dyspnea, orthopnea and leg swelling Denies abdominal pain, nausea, vomiting,diarrhea or constipation.  Denies rectal bleeding or change in bowel movement. Denies dysuria, frequency, hesitancy or incontinence. Denies headaches, seizure, numbness, or tingling. Denies depression, anxiety or insomnia. Denies skin break down or rash.        Objective:   Physical Exam Pleasant elderly female, alert and oriented and in no Cardiopulmonary distress.  HEENT: No facial asymmetry, EOMI, no sinus tenderness, TM's clear, Oropharynx pink and moist.  Neck supple no adenopathy.  Chest: Clear to auscultation bilaterally.Decreased air entry bilaterally  CVS: S1, S2 no murmurs, no S3.  ABD: Soft non tender. Bowel sounds normal.  Ext: No edema  MS: decreased  ROM spine, shoulders, hips and knees.  Skin: Intact, no ulcerations or rash noted.  Psych: Good eye contact, normal affect. Memory loss, not anxious or depressed appearing.  CNS: CN 2-12 intact, power, tone and sensation normal throughout.        Assessment & Plan:

## 2010-05-14 NOTE — Patient Instructions (Addendum)
F/U in 2 months   Please think about quitting smoking.  This is very important for your health.  Consider setting a quit date, then cutting back or switching brands to prepare to stop.  Also think of the money you will save every day by not smoking.  Quick Tips to Quit Smoking: Fix a date i.e. keep a date in mind from when you would not touch a tobacco product to smoke  Keep yourself busy and block your mind with work loads or reading books or watching movies in malls where smoking is not allowed  Vanish off the things which reminds you about smoking for example match box, or your favorite lighter, or the pipe you used for smoking, or your favorite jeans and shirt with which you used to enjoy smoking, or the club where you used to do smoking  Try to avoid certain people places and incidences where and with whom smoking is a common factor to add on  Praise yourself with some token gifts from the money you saved by stopping smoking  Anti Smoking teams are there to help you. Join their programs  Anti-smoking Gums are there in many medical shops. Try them to quit smoking   Side-effects of Smoking: Disease caused by smoking cigarettes are emphysema, bronchitis, heart failures  Premature death  Cancer is the major side effect of smoking  Heart attacks and strokes are the quick effects of smoking causing sudden death  Some smokers lives end up with limbs amputated  Breathing problem or fast breathing is another side effect of smoking  Due to more intakes of smokes, carbon mono-oxide goes into your brain and other muscles of the body which leads to swelling of the veins and blockage to the air passage to lungs  Carbon monoxide blocks blood vessels which leads to blockage in the flow of blood to different major body organs like heart lungs and thus leads to attacks and deaths  During pregnancy smoking is very harmful and leads to premature birth of the infant, spontaneous abortions, low weight of the  infant during birth  Fat depositions to narrow and blocked blood vessels causing heart attacks  In many cases cigarette smoking caused infertility in men  You are being started on a new med for your blood pressure  Lab in 2 months, fasting, chem 7

## 2010-05-17 NOTE — Assessment & Plan Note (Signed)
  Commitment to regular exercise and healthy  food choices, with portion control discussed. DASH diet and low fat diet discussed and literature offered.  medication to be started  at this visit.Has been on med in the past

## 2010-05-17 NOTE — Assessment & Plan Note (Signed)
Deteriorated, continues to smoke

## 2010-05-17 NOTE — Assessment & Plan Note (Signed)
Unchanged not willing to quit at this time

## 2010-05-17 NOTE — Assessment & Plan Note (Signed)
Unchanged, management per orthopedics

## 2010-05-27 NOTE — Assessment & Plan Note (Signed)
[facility]                       Jennifer [name] CARDIOLOGY OFFICE NOTE   [name], [name]                      MRN:          [id number]  DATE:02/07/2007                            DOB:          [birth year]    [name] returns in follow-up.  I have seen her for atypical chest pain  PACs PVCs.  She had a borderline hypertension.  The last time I saw her,  I cleared for knee surgery.  She went up to [facility] [place]  and had her surgery.  Her daughter lives up there and help her rehab.  She did not have any complications.  She is not having any recurrent  chest pain.  Her PACs and PVCs are benign.  She is not having  significant palpitations or syncope.   She has mild lower extremity edema which is chronic from varicosities.  She has not had superficial phlebitis.  There was apparently a problem  with her left ankle after the surgery with an infection that required  antibiotics.  She is currently been afebrile and not on antibiotics.   REVIEW OF SYSTEMS:  Otherwise negative.   CURRENT MEDICATIONS:  Include p.r.n. hydrochlorothiazide for lower  extremity edema, Klor-Con p.r.n., tramadol 50 t.i.d.   Unfortunately, continues to smoke a pack a day.  She has clinical COPD.   EXAM:  Remarkable for an elderly black female in no distress.  Weight is 202, blood pressure 140/80 pulses 87 with occasional PACs and  PVCs, respiratory rate 16, afebrile.  HEENT:  Unremarkable.  Carotids are without bruit.  No lymphadenopathy, thyromegaly, JVP  elevation.  LUNGS:  Clear with occasional wheezes.  Good diaphragmatic motion.  S1-  S2 normal heart sounds.  PMI normal.  ABDOMEN:  Benign bowel sounds positive AAA no tenderness.  No  hepatosplenomegaly or hepatojugular reflux.  Distal pulses are intact.  She has bilateral varicosities with trace  edema.  She has a new right total knee replacement scar.  There is still  some crepitus and decreased range of motion  in the right knee.  NEURO:  Nonfocal.  Skin is warm and dry.  Cellulitis in left lower extremity is healed.   IMPRESSION:  1. Premature atrial contractions and premature ventricular      contractions, benign.  No indication for treatment.  Can have beta      blocker in the future as needed.  History of a normal left      ventricular function and nonischemic Myoview in December.  2. Borderline hypertension.  Continue follow with Dr. Lodema Hong.  Can      always go back on hydrochlorothiazide which has worked for her in      the past.  3. Lower extremity edema and varicosities.  No evidence superficial      phlebitis.  She probably should consider to be seen in one of the      vein clinic for sclerosis.  I suspect her post-op cellulitis was      secondary to this venous insufficiency.  Currently has healed well.  4. Bilateral knee osteoarthritis, left knee fixed, right knee may give  her problems she may consider surgery for this.  She continues to      be cleared for this.  Her biggest risk for perioperative problems      would be a respiratory status and ongoing smoking.  5. Clinical chronic obstructive pulmonary disease.  She says she has      inhalers.  Dr. Lodema Hong can follow this and have PFTs on her.  She      is not motivated to quit smoking.  I spent less than 10 minutes      talking to her about this.   I will see her back in 6 months again if she needs any further surgery  on her right knee, she is cleared to have this done.     Jennifer [name]. Jennifer Emms, MD, [facility]  Electronically Signed    PCN/MedQ  DD: 02/07/2007  DT: 02/07/2007  Job #: [id number]

## 2010-05-27 NOTE — Procedures (Signed)
Jennifer [name], Jennifer [name]               ACCOUNT NO.:  [id number]   MEDICAL RECORD NO.:  [id number]          PATIENT TYPE:  REC   LOCATION:  RAD                           FACILITY:  [facility]   PHYSICIAN:  Gerrit Friends. Dietrich Pates, MD, FACCDATE OF BIRTH:  [birth year]   DATE OF PROCEDURE:  DATE OF DISCHARGE:                                ECHOCARDIOGRAM   CLINICAL DATA:  An 77 year old woman for preoperative clearance.  M-mode  aorta 3.1, left atrium 3.7, septum 1.5, posterior wall 1.4, LV diastole  3.5, LV systole 2.6.  1. Technically adequate echocardiographic study.  2. Normal left atrium, right atrium and right ventricle.  3. Mild aortic valvular sclerosis.  Normal diameter of the proximal      ascending aorta, with mild calcification of the wall and annulus.  4. Normal mitral valve.  5. Normal tricuspid valve; physiologic regurgitation; normal estimated      RV systolic pressure.  6. Normal proximal pulmonary artery; suboptimal imaging of the      pulmonic valve, which appears grossly normal.  7. Normal left ventricular size; mild to moderate hypertrophy, most      notable is the upper portion of the septum.  Regional and global LV      systolic function are normal.  8. Normal inferior vena cava.      Gerrit Friends. Dietrich Pates, MD, [facility]  Electronically Signed     RMR/MEDQ  D:  12/02/2006  T:  12/03/2006  Job:  [id number]

## 2010-05-27 NOTE — Assessment & Plan Note (Signed)
[facility]                       [place] CARDIOLOGY OFFICE NOTE   Jennifer [name], Jennifer [name]                      MRN:          [id number]  DATE:11/24/2006                            DOB:          [birth year]    Jennifer [name] returns today for followup.   The patient was last seen by me in January.  She has been having some  atypical chest pain.  She had a cath in December of 2006 with no  significant coronary artery disease.  She has had an echo with good left  ventricular function.   Unfortunately, the patient continues to have bilateral knee problems,  right greater than left.  She is strongly considering having surgery at  the beginning of the year, probably locally now with Dr. Romeo Apple.  The  patient initially thought about going to [place] to be close to her  daughter, but is rethinking this.   Her chest pain is atypical.  It is sharp.  It is not necessarily  exertional.  There is no pleuritic component.  It is not necessarily  progressive, but she really did not complain about it much when I saw  her in January.  She can take antiinflammatories, such as tramadol, and  it seems to get better.  Sometimes, it is exacerbated with the use of  her cane or walker.   The patient's review of systems is otherwise negative.   CURRENT MEDICATIONS:  1. Hydrochlorothiazide 25 a day for edema.  2. Tramadol 50 t.i.d.   She is allergic to CONTRAST DYE.  She is also allergic to PENICILLIN.   EXAM:  Remarkable for an elderly black female in no distress.  Her weight is 209, blood pressure 110/70, pulse 60 and regular,  afebrile.  HEENT:  Unremarkable.  No carotid bruit.  No lymphadenopathy.  No thyromegaly.  No JVP  elevation.  LUNGS:  Clear.  Good diaphragmatic motion.  No wheezing.  S1, S2 with a soft systolic murmur.  PMI is not palpable.  ABDOMEN:  Benign.  Bowel sounds positive.  No hepatosplenomegaly or  hepatojugular reflux.  Distal pulses are intact.   No edema currently.  The right knee has  significant crepitus with arthritic changes.   Her EKG done on October 22 showed sinus rhythm with occasional PVC and  PAC.   IMPRESSION:  1. The patient's chest pain is atypical.  However, she is elderly and      needs knee surgery.  She will be referred for an adenosine Myoview.  2. Lower extremity edema, improved.  Continue low salt diet and      elevate legs.  Continue hydrochlorothiazide.  3. Right knee pain.  The patient was encouraged to follow up locally      with an orthopedic doctor and arrange for surgery once we clear her      heart.  She will continue her tramadol as an antiinflammatory.  She      will be extremely careful with her walker and cane with regard to      not falling.  4. Abnormal EKG with premature atrial contractions and premature  ventricular contractions.  She may be at risk for perioperative      atrial fibrillation.  I do not think that empiric amiodarone would      help much.  However, we may put her on a beta blocker once she is      hospitalized for possible knee surgery.   Overall, Jennifer [name] will probably be cleared for surgery.  I suspect that her  cardiac studies will be normal and she just needs to decide where she  wants to have her surgery done.  I encouraged her to have one knee done  at a time and the right seems to be worse.     Jennifer [name]. Eden Emms, MD, [facility]  Electronically Signed    PCN/MedQ  DD: 11/24/2006  DT: 11/25/2006  Job #: [id number]

## 2010-05-30 NOTE — Discharge Summary (Signed)
Jennifer [name], Jennifer [name] NO.:  [id number]   MEDICAL RECORD NO.:  [id number]          PATIENT TYPE:  INP   LOCATION:  A315                          FACILITY:  [facility]   PHYSICIAN:  Dalia Heading, M.D.  DATE OF BIRTH:  [birth year]   DATE OF ADMISSION:  09/09/2005  DATE OF DISCHARGE:  08/30/2007LH                                 DISCHARGE SUMMARY   HOSPITAL COURSE SUMMARY:  The patient is an 76 year old black female who  presented to [facility] for a left modified radical mastectomy. She  was biopsy proven to have a malignancy in the left breast. She has a history  of right breast carcinoma. The left modified radical mastectomy was done on  September 09, 2005. She tolerated the procedure well. Postoperative course has  been unremarkable.   The patient is being discharged home on postoperative day #1 in good  improving condition.   DISCHARGE INSTRUCTIONS:  The patient is to follow up with Dr. Franky Macho  on September 15, 2005. She will be followed by home health daily to check her  wound and drain her Jackson-Pratt drains.   DISCHARGE MEDICATIONS:  Darvocet-N 100, one tablet p.o. q.6 h. p.r.n. pain.  She is to resume all her other medications as previously prescribed.   PRINCIPAL DIAGNOSES:  1. Left breast carcinoma.  2. History of right breast carcinoma.  3. Arthritis.   PRINCIPAL PROCEDURES:  Left modified radical mastectomy on September 09, 2005.      Dalia Heading, M.D.  Electronically Signed     MAJ/MEDQ  D:  09/10/2005  T:  09/11/2005  Job:  [id number]   cc:   [name], M.D.  Fax: [phone]   Ladona Horns. Mariel Sleet, MD  Fax: [phone]

## 2010-05-30 NOTE — Procedures (Signed)
[name], Jennifer [name]               ACCOUNT NO.:  [id number]   MEDICAL RECORD NO.:  [id number]          PATIENT TYPE:  OUT   LOCATION:  RESP                          FACILITY:  [facility]   PHYSICIAN:  Edward L. Juanetta Gosling, M.D.DATE OF BIRTH:  05-Jan-1923   DATE OF PROCEDURE:  DATE OF DISCHARGE:  03/24/2007                            PULMONARY FUNCTION TEST   1. Spirometry shows a mild-to-moderate ventilatory defect with      evidence of airflow obstruction.  2. Lung volume showed no restrictive change with evidence of air      trapping.  3. DLCO is moderately reduced.  4. Arterial blood gases are normal.  5. There is no significant bronchodilator improvement.  6. Compared to previous study February 05, 2006, there has been      reduction in both forced vital capacity and FEV-1, suggesting      worsening problems with airflow obstruction but also a reduction in      the residual volume suggesting left air trapping.      Edward L. Juanetta Gosling, M.D.  Electronically Signed     ELH/MEDQ  D:  03/25/2007  T:  03/26/2007  Job:  [id number]   cc:   [name], M.D.  Fax: [phone]

## 2010-05-30 NOTE — H&P (Signed)
Jennifer [name], Jennifer [name]               ACCOUNT NO.:  [id number]   MEDICAL RECORD NO.:  [id number]          PATIENT TYPE:  AMB   LOCATION:  DAY                           FACILITY:  [facility]   PHYSICIAN:  Dalia Heading, M.D.  DATE OF BIRTH:  1922/06/01   DATE OF ADMISSION:  DATE OF DISCHARGE:  LH                                HISTORY & PHYSICAL   CHIEF COMPLAINT:  Left breast carcinoma.   HISTORY OF PRESENT ILLNESS:  The patient is an 77 year old black female who  is referred for evaluation and treatment of a left breast carcinoma.  This  was found on routine mammography.  It is biopsy-proven positive for  malignancy.  ER and PR studies are positive.  She has a history of right  breast carcinoma, status post mastectomy in the remote past.  This was  treated with tamoxifen.   PAST MEDICAL HISTORY:  Is as noted above.   PAST SURGICAL HISTORY:  Right partial mastectomy, cholecystectomy.   CURRENT MEDICATIONS:  Celebrex as needed for arthritis.   ALLERGIES:  1. PENICILLIN.  2. CODEINE.  3. IRON SUPPLEMENTS.  4. IVP DYE.   REVIEW OF SYSTEMS:  The patient does smoke on occasion.  She does have  extrinsic allergies.  No history of chest pain, MI, CVA, diabetes mellitus,  or bleeding disorders are noted.   PHYSICAL EXAMINATION:  GENERAL:  The patient is a moderately-obese black  female in no acute distress.  NECK:  Supple without lymphadenopathy.  LUNGS:  Clear to auscultation with equal breath sounds bilaterally.  HEART:  Examination reveals a regular rate and rhythm without S3, S4, or  murmurs.  BREASTS:  The right breast has been removed.  No masses are noted.  Left  breast examination reveals no dominant mass, nipple discharge, or dimpling.  The axilla is negative for palpable nodes.   MRI of the breast is reportedly unremarkable except as noted above.   IMPRESSION:  Left breast carcinoma.   PLAN:  The patient is scheduled for left modified radical mastectomy on  September 09, 2005.  The risks and benefits of the procedure including  bleeding, infection, cardiopulmonary difficulties, the possibility of a  blood transfusion were fully explained to the patient, who gave informed  consent.      Dalia Heading, M.D.  Electronically Signed     MAJ/MEDQ  D:  09/03/2005  T:  09/03/2005  Job:  [id number]   cc:   Short Stay at [facility] E. Lodema Hong, M.D.  Fax: [phone]   Ladona Horns. Mariel Sleet, MD  Fax: [phone]

## 2010-05-30 NOTE — H&P (Signed)
[name], [name]               ACCOUNT NO.:  [id number]   MEDICAL RECORD NO.:  [id number]          PATIENT TYPE:   LOCATION:                                 FACILITY:   PHYSICIAN:  Jennifer [name], M.D.        DATE OF BIRTH:   DATE OF ADMISSION:  01/07/2005  DATE OF DISCHARGE:                                HISTORY & PHYSICAL   REASON FOR ADMISSION:  Jennifer [name] is an 77 year old female, with no prior  cardiac history, with cardiac risk factors notable for hypertension,  longstanding tobacco smoking, and age, who presents via transfer from [facility]  Emergency Room with new-onset chest pain relieved by nitroglycerin.  A  followup troponin (POC) was elevated at 0.62.  Serial EKGs, however, showed  no acute changes.   The patient developed new-onset midsternal chest pain at approximately 1:00  a.m. while sitting up and watching television.  She has never had chest pain  before, and denied any recent development of exertional chest discomfort or  dyspnea.  Her chest pain was described as dull, 9/10 in intensity, without  any associated dyspnea, diaphoresis, nausea/vomiting, or radiation.  She  contacted her daughter, was taken to the emergency room, and was treated  with nitroglycerin with subsequent total resolution of her pain.  She has  not had any recurrent symptoms.   Initial cardiac markers were negative, but a second troponin I (POC) was  elevated at 0.62.  EKGs were without acute changes.   ALLERGIES:  1.  PENICILLIN.  2.  REMOTE HISTORY OF CONTRAST DYE ALLERGY.   MEDICATIONS PRIOR TO ADMISSION:  1.  Hydrochlorothiazide 25 mg daily.  2.  Tylenol No. 3.  3.  Combivent p.r.n.   PAST MEDICAL HISTORY:  1.  Hypertension.  2.  Remote total hysterectomy.  3.  Right breast lumpectomy (approximately eight years ago) secondary to      benign tumor.  4.  Bilateral knee arthritis.   SOCIAL HISTORY:  The patient lives alone in [place].  She is widowed and  has two grown  children.  She has been smoking at least a half a pack a day  for approximately 40  years.  Denies alcohol use.  The patient is extremely  self-sufficient, active, and does not need the assistance of a walker/cane.   FAMILY HISTORY:  Negative for any premature coronary artery disease.   REVIEW OF SYSTEMS:  Denies any recent exertional chest pain, dyspnea,  orthopnea, PND, or significant lower extremity edema.  Denies any recent  fever, chills, or productive cough.  Denies any recent evidence of upper or  lower GI bleeding.  Denies any symptoms of heartburn or history of peptic  ulcer disease.  Remaining systems negative.   PHYSICAL EXAMINATION:  Blood pressure 88/44 on admission -- 147/81 on  initial presentation at [facility].  Temperature 97.6, pulse 60 regular,  respirations 22.  GENERAL:  [name] in no apparent distress.  HEENT:  Normocephalic and atraumatic.  NECK:  Preserved bilateral carotid pulses; soft bilateral bruits (right  greater than left) versus transmitted murmur.  LUNGS:  Diminished breath sounds in bases with faint expiratory crackles.  HEART:  Regular rate and rhythm (S1, S2), grade 1-2/6 systolic ejection  murmur heard loudest in the aortic region; no gallops; split S2.  ABDOMEN:  Protuberant, nontender, intact bowel sounds __________.  EXTREMITIES:  Preserved bilateral femoral pulses without bruits; 2/4  dorsalis pedis pulses with no significant pedal edema.  NEUROLOGIC:  No focal deficit.   A chest x-ray ([facility]):  Chronic lung disease; stable right lung nodule.   Electrocardiogram:  Normal sinus rhythm at 70 BPM with left axis deviation;  nonspecific ST changes.   LABORATORY DATA:  Hemoglobin 13, hematocrit 39, WBC 4.5, platelets 49,  sodium 137, potassium 3.8, BUN 11, creatinine 0.8, glucose 100, initial  cardiac markers (POC) MB less than 1.0, with followup 11.6; troponin I at  less than 0.05, followup 0.62.  BNP less than 30.  INR 1.0.    IMPRESSION:  1.  Unstable angina pectoris.      1.  The patient is currently pain-free; chest pain completely resolved          after treatment with sublingual nitroglycerin.  2.  Hypotension.      1.  Suspect secondary to combination of nitroglycerin, morphine, and IV          Lopressor.  3.  Remote history of contrast allergy.  4.  Multiple cardiac risk factors.      1.  Hypertension.      2.  Tobacco.      3.  Age.  5.  Chronic obstructive pulmonary disease.  6.  Obesity.  7.  Carotid bruits.   PLAN:  The patient will be admitted to telemetry for further evaluation and  treatment of signs or symptoms worrisome for unstable anginal pectoris.  We  will continue cycling markers and continue treatment with aspirin, IV  nitroglycerin and heparin.  Given her current hypotension, we will initiate  low dose Lopressor at 12.5 t.i.d. if blood pressure allows.   Recommendations to proceed with cardiac catheterization which will be  deferred until tomorrow, given the patient's reported remote history of  contrast allergy.  We will thus initiate premedication treatment regimen  with prednisone and H2 antagonist.  The risks/benefits of the procedure have  been discussed with the patient, and she is agreeable to proceed.   In addition to cycling markers, we will also check a fasting lipid profile  and a TSH level.  Plavix will be deferred secondary to age and possibility  of multivessel disease.  Hydrochlorothiazide will be discontinued,  particularly in light of the current hypotension.  We will also schedule  carotid Dopplers to rule out intrinsic carotid artery disease.      Jennifer [name], P.A. [facility]      Jennifer [name], M.D.  Electronically Signed    GS/MEDQ  D:  01/07/2005  T:  01/07/2005  Job:  [id number]   cc:   Jennifer [name], M.D.  Fax: [phone]

## 2010-05-30 NOTE — Cardiovascular Report (Signed)
NAMEBRANDEE, MARKIN               ACCOUNT NO.:  [id number]   MEDICAL RECORD NO.:  [id number]          PATIENT TYPE:  INP   LOCATION:  2018                         FACILITY:  [facility]   PHYSICIAN:  Rollene Rotunda, M.D.   DATE OF BIRTH:  Jun 01, 1922   DATE OF PROCEDURE:  01/08/2005  DATE OF DISCHARGE:                              CARDIAC CATHETERIZATION   PROCEDURE:  Left heart catheterization/coronary arteriography.   INDICATIONS FOR PROCEDURE:  Patient with chest pain and elevated troponins.   PROCEDURE NOTE:  Left heart catheterization performed via the right femoral  artery, the artery was cannulated using anterior wall puncture.  A #6 French  arterial sheath was inserted via modified Seldinger technique.  Preformed  Judkins and a pigtail catheter were utilized.  The patient tolerated the  procedure well and left the lab in stable condition.   RESULTS:  Hemodynamics:  LV 150/23, AO 154/69.   Coronaries:  The left main was normal.  The LAD had mid 40% stenosis at the  take off of a small mid diagonal.  There were mild, diffuse mid to distal  luminal irregularities.  The first diagonal was large and normal.  The  second diagonal was small and normal.  The circumflex and the AV groove had  diffuse luminal irregularities.  There was a large mid obtuse marginal which  was  normal.  The right coronary artery was a very large vessel, it was  dominant.  There was a long distal 25% stenosis.  The PDA was large and  normal.  There were two posterolaterals which were normal.   Left ventriculogram:  The left ventriculogram was obtained in the RAO and  LAO projections.  The EF was 65%.   CONCLUSION:  Nonobstructive large vessel disease, normal left ventricular  function.   PLAN:  The patient did have a significant troponin elevation as well as CK  MB bump.  However, I do not see large vessel disease to explain this.  There  are no regional wall motion abnormalities.  We should get a spiral  CT to  rule out pulmonary emboli.  She will be pre-medicated for a dye allergy.  In  addition, she will get an echocardiogram to further assess for regional wall  motion abnormalities and to evaluate for left ventricular hypertrophy.           ______________________________  Rollene Rotunda, M.D.     JH/MEDQ  D:  01/08/2005  T:  01/08/2005  Job:  [id number]   cc:   [name], M.D.  Fax: [phone]   Jesse Sans. Wall, M.D.  [address]  [address]  [place]  [place] [address]

## 2010-05-30 NOTE — Procedures (Signed)
NAMEKLOEY, CAZAREZ               ACCOUNT NO.:  [id number]   MEDICAL RECORD NO.:  [id number]          PATIENT TYPE:  EMS   LOCATION:  ED                            FACILITY:  [facility]   PHYSICIAN:  Edward L. Juanetta Gosling, M.D.DATE OF BIRTH:  1922/06/08   DATE OF PROCEDURE:  01/07/2005  DATE OF DISCHARGE:                                EKG INTERPRETATION   I agree that this EKG is probably normal, but in V3 and V4, there are some  ST changes which, although not abnormal, are unusual and clinical  correlation is suggested.      Edward L. Juanetta Gosling, M.D.  Electronically Signed     ELH/MEDQ  D:  01/07/2005  T:  01/07/2005  Job:  [id number]

## 2010-05-30 NOTE — Discharge Summary (Signed)
NAMEJAMILETH, PUTZIER NO.:  [id number]   MEDICAL RECORD NO.:  [id number]          PATIENT TYPE:  INP   LOCATION:  2018                         FACILITY:  [facility]   PHYSICIAN:  Dorian Pod, NP    DATE OF BIRTH:  07-13-1922   DATE OF ADMISSION:  01/07/2005  DATE OF DISCHARGE:  01/09/2005                                 DISCHARGE SUMMARY   PRIMARY CARE PHYSICIAN:  Dr. Syliva Overman.   CARDIOLOGIST:  Seen by Dr. Daleen Squibb this admission.   DISCHARGE DIAGNOSIS:  1.  Chest pain with elevated cardiac enzymes and EKG normal sinus rhythm at      a rate of 70 with left axis deviation and nonspecific ST changes.      Status post cardiac catheterization on January 08, 2005 by Dr. Edwena Felty showing nonobstructive large vessel disease with normal left      ventricular function with an EF of 65%.  2.  Carotid bruits status post carotid Doppler with no ICA stenosis.  3.  Hypotension most likely secondary to Lopressor, nitroglycerin and      morphine being given for chest pain.  Completely resolved prior to      discharge.  4.  Chronic obstructive pulmonary disease.  5.  Ongoing tobacco abuse.   PAST MEDICAL HISTORY:  1.  Hypertension.  2.  Bilateral knee arthritis.  3.  Remote total hysterectomy.  4.  Right breast lumpectomy secondary to benign tumor approximately eight      years ago.   PROCEDURES THIS ADMISSION:  1.  Cardiac catheterization on January 08, 2005.  2.  Carotid Dopplers on January 07, 2005.  3.  2-D echocardiogram on December 29. 2006.  4.  VQ scan on January 09, 2005.   HOSPITAL COURSE:  Ms. Norment is an 77 year old Caucasian female, no prior  cardiac history, with cardiac risk factors including hypertension and  longstanding tobacco abuse and age, who presented to [facility] via transfer  from [facility] Emergency Room with new onset of chest pain relieved with  nitroglycerin.  Cardiac troponin elevated at 0.62.  EKG, however, showing  no  acute changes.  The patient was admitted.  Cardiac enzymes were cycled.  The  patient was treated with aspirin, IV nitroglycerin and heparin, low dose  beta blocker.  Proceeded with cardiac catheterization after treating the  patient for history of contrast allergy.  The patient also scheduled for  carotid Doppler secondary to carotid bruits.  The patient at cath lab on  January 08, 2005, results as stated above.  The patient tolerated the  procedure without complications, denying any further chest pain.  VQ scan  done to rule out pulmonary embolism.  Positive for COPD.  The patient  denying any further chest discomfort and states she wants to go home.  Cath  site stable.  The patient afebrile.  Blood pressure 126/69, saturations 98%  on room air.  EKG without ectopy.  Dr. Antoine Poche in to see patient prior to  discharge.  The patient to follow up with Dr. Lodema Hong for further  evaluation  of chest discomfort.  The patient strongly encouraged to discontinue tobacco  use.  Follow a low sodium diet secondary to history of hypertension.   RESULTS PENDING:  Echocardiogram. I do not have results available at this  time.   LABWORK PRIOR TO DISCHARGE:  Hemoglobin 13.2, hematocrit 38.7, BUN 11,  creatinine 0.8.  BNP less than 30.  TSH 1.251.  Troponin peak of 3.63.  Chest x-ray showing chronic lung disease, no acute abnormalities.   DISCHARGE INSTRUCTIONS:  No driving for two days.  No lifting over 10 pounds  x 1 week.  May shower.  May walk up steps.  Increase activity slowly.  Hydrochlorothiazide 25 mg daily as previously taken.  Combivent inhaler as  previously used.  Follow up with Dr. Lodema Hong within the next two weeks.  The  patient verbalizes understanding and will call for appointment.   DURATION OF DISCHARGE ENCOUNTER:  Thirty minutes.      Dorian Pod, NP     MB/MEDQ  D:  01/09/2005  T:  01/11/2005  Job:  [id number]   cc:   [name], M.D.  Fax: [phone]   Rollene Rotunda, M.D.  [address]  [address]  [place]  [place] [address]

## 2010-05-30 NOTE — Op Note (Signed)
Jennifer [name], PANIK               ACCOUNT NO.:  [id number]   MEDICAL RECORD NO.:  [id number]          PATIENT TYPE:  INP   LOCATION:  A315                          FACILITY:  [facility]   PHYSICIAN:  Dalia Heading, M.D.  DATE OF BIRTH:  06/06/1922   DATE OF PROCEDURE:  09/09/2005  DATE OF DISCHARGE:                                 OPERATIVE REPORT   PREOPERATIVE DIAGNOSIS:  Left breast carcinoma.   POSTOPERATIVE DIAGNOSIS:  Left breast carcinoma.   PROCEDURE:  Left modified radical mastectomy.   SURGEON:  Dalia Heading, M.D.   ANESTHESIA:  General endotracheal.   INDICATIONS:  The patient is an 77 year old black female with a history of  right breast carcinoma who now presents with a newly diagnosed left breast  carcinoma.  The risks and benefits of the procedure including bleeding,  infection, nerve injury, possible recurrence of cancer, were fully explained  to the patient who gave informed consent.   PROCEDURE NOTE:  The patient was placed in supine position.  After induction  of general endotracheal anesthesia, the left breast and axilla were prepped  and draped in the usual sterile technique with Betadine.  Surgical site  confirmation was performed.   An incision was made medial to lateral around the left nipple.  A superior  flap was then formed to the chest wall and clavicle region and an inferior  flap was formed to the chest wall.  The left breast was then removed off the  pectoralis major muscle and chest wall using Bovie electrocautery medial to  lateral.  A level 2 axillary dissection was then performed in continuity.  Care was taken to avoid the long thoracic nerve and the thoracodorsal  artery, vein, and nerve.  The left breast and axillary contents were then  removed from the operative field and sent to pathology further examination.  The wound was copiously irrigated normal saline.  Two Jackson-Pratt drains  were placed inferior to the incision line, the  superior one to the flap and  the inferior one to the left axilla.  Both were secured at the skin level  using 3-0 nylon interrupted sutures.  The subcutaneous layer was  reapproximated using 2-0 Vicryl interrupted sutures.  The skin was closed  using staples.  Betadine ointment and dry sterile dressings were applied.   All tape and needle counts were correct at the end of the procedure.  The  patient was extubated in the operating room and went back to the recovery  room awake in stable condition.   COMPLICATIONS:  None.   SPECIMEN:  Left breast and axilla.   DRAINS:  Jackson-Pratt drains, superior to flap and inferior to the left  axilla.   BLOOD LOSS:  Less than 100 mL      Dalia Heading, M.D.  Electronically Signed     MAJ/MEDQ  D:  09/09/2005  T:  09/09/2005  Job:  [id number]   cc:   Ladona Horns. Mariel Sleet, MD  Fax: [phone]   [name], M.D.  Fax: [phone]

## 2010-05-30 NOTE — Procedures (Signed)
[name], Jennifer [name]               ACCOUNT NO.:  [id number]   MEDICAL RECORD NO.:  [id number]          PATIENT TYPE:  OUT   LOCATION:  RESP                          FACILITY:  [facility]   PHYSICIAN:  Edward L. Juanetta Gosling, M.D.DATE OF BIRTH:  May 27, 1922   DATE OF PROCEDURE:  DATE OF DISCHARGE:  02/05/2006                            PULMONARY FUNCTION TEST   1. Spirometry shows a mild ventilatory defect with evidence of airflow      obstruction.  2. Lung volumes are normal with evidence of air trapping.  3. DLCO was mildly reduced.  4. Arterial blood gases were normal.  5. There was no significant bronchodilator effect.  6. This study is consistent with a clinical diagnosis of chronic      obstructive pulmonary disease.      Edward L. Juanetta Gosling, M.D.  Electronically Signed     ELH/MEDQ  D:  02/08/2006  T:  02/08/2006  Job:  [id number]   cc:   [name]. Eden Emms, MD, [facility]  [address]  [address]  [place]  [place] [address]

## 2010-05-30 NOTE — Assessment & Plan Note (Signed)
[facility]                       [place] CARDIOLOGY OFFICE NOTE   Jennifer [name], Jennifer [name]                      MRN:          [id number]  DATE:02/02/2006                            DOB:          1922/11/14    Jennifer [name] is seen today for preop clearance.   She was referred by Dr. Lodema Hong.   The patient apparently needs a left knee replacement.  She is going up  to [facility] to have this done by a Dr. Laurice Record.  The reason she is going  to [place] is to be close to a daughter who will help care for her  perioperatively.   The patient has a history of a subendocardial MI.  She had a heart cath  in December, 2006 by Dr. Antoine Poche.  At the time, she had elevated  enzymes.  She had a spiral CT to rule out PE.  She does have a history  of DYE allergy and was premedicated.  Her heart cath did not show  significant disease.  She had a 40% mid LAD lesion, nothing else.   Dr. Antoine Poche could not explain the clinical situation, but she was at  low risk for recurrence, given normal epicardial coronary arteries.   Since that time, the patient has been doing fairly well.  She has not  had recurrent chest pain, PND, or orthopnea.  She has some mild chronic  shortness of breath due to her weight.  Her biggest concern is  increasing left knee pain, as she indicates, I have bone on bone.   The patient does have a bit of hypertension.  Has been treated with  hydrochlorothiazide.  Her risk factors otherwise include hypertension.  She is not a diabetic.  She smoked quite a bit and continues to smoke at  least a pack a day.   I am actually more concerned about her preop pulmonary status than  anything else.   Her previous surgical history has included bilateral breast surgery for  breast cancer.  She has had a previous colonoscopy.   She is allergic to PENICILLIN and all types of X-RAY DYE.   She is on Celebrex 200 a day, hydrochlorothiazide 25 a day, and Klor-Con  20 a day.   FAMILY HISTORY:  Remarkable for her father dying at age 21 of prostate  cancer and mother dying at age 53 of breast cancer.   She is retired from [employer].  She was quite the Chief Executive Officer.  She is widowed  and has family who will help care for her in [place] after her knee  replacement.  She reads a lot.  She is not particularly active due to  her left knee pain.   PHYSICAL EXAMINATION:  GENERAL:  She is overweight.  VITAL SIGNS:  Her blood pressure is 110/78, pulse 84 and regular.  HEENT:  Normal.  LUNGS:  Mild end-expiratory wheezes and rhonchi.  There is an S1 and S2  with this but normal heart sounds.  Carotids are normal.  Distal pulses  are intact with no edema.  ABDOMEN:  Benign with no AAA.   She has a 2D  echocardiogram from January 09, 2005, which showed an EF  of 65-75% with no significant valve abnormalities.   The patient's EKG is essentially normal with no previous MI and left  axis deviation.   IMPRESSION:  Patient is not having chest pain.  She had a heart cath in  December of 2006 with no epicardial coronary artery disease.  She has a  normal EKG and an echocardiogram within the past year showing normal  left ventricular function.  I do think she needs any further tests to  clear her from a cardiac standpoint.   I think she is low risk for periop atrial fibrillation, congestive heart  failure, myocardial infarction.   I am more concerned about her active smoking and clinical evidence for  chronic obstructive pulmonary disease.  She does apparently have a  Combivent inhaler that she uses.  I think she would benefit from preop  PFTs pre and post bronchodilator and new consultation with Dr. Juanetta Gosling  for her pulmonary status.  I explained to her that even stopping smoking  for 2-3 weeks prior to hip surgery would be beneficial.  So, from our  standpoint, she is cleared for surgery.   She will follow up with Dr. Juanetta Gosling and Dr. Lodema Hong to optimize her   pulmonary status, and then I suspect she will go up to [facility] to have  her knee surgery.  We would be happy to see the patient when she returns  for further therapy of her blood pressure and other risk factors.     [name]. Eden Emms, MD, [facility]  Electronically Signed    PCN/MedQ  DD: 02/02/2006  DT: 02/02/2006  Job #: [id number]

## 2010-07-07 ENCOUNTER — Other Ambulatory Visit: Payer: Self-pay | Admitting: Family Medicine

## 2010-07-28 ENCOUNTER — Encounter: Payer: Self-pay | Admitting: Family Medicine

## 2010-07-29 ENCOUNTER — Ambulatory Visit (INDEPENDENT_AMBULATORY_CARE_PROVIDER_SITE_OTHER): Payer: Medicare Other | Admitting: Orthopedic Surgery

## 2010-07-29 DIAGNOSIS — G5790 Unspecified mononeuropathy of unspecified lower limb: Secondary | ICD-10-CM

## 2010-07-29 MED ORDER — METHYLPREDNISOLONE 4 MG PO KIT: PACK | ORAL | Status: AC

## 2010-07-29 NOTE — Progress Notes (Signed)
Bilateral knee replacements the RIGHT knee was done in September of 2010 presented with bilateral leg pain bilateral leg numbness radiating to her ankles with a history of lumbar disc disease  Currently taking 100 mg of Neurontin at night and ibuprofen 800 twice a day  RIGHT knee exam shows a well-healed anterior incision.  There is no tenderness to palpation or joint effusion.  She has a 5 flexion contracture with 120 of knee flexion.  The knee is stable and she has good extension power.  She does ambulate with a cane.  The skin is discolored from chronic venous stasis disease but there is no redness or tenderness at this time  So I think she is having exacerbation of her lumbar disc disease and I have advised her to take her Neurontin more frequently and also put her on a Dosepak

## 2010-07-29 NOTE — Patient Instructions (Signed)
Gabapentin increase to 2 tablets at night and 1 twice a day   Take 1 at 8 am  Take 1 at 4 pm  And 2 at  8 pm  Also take medrol dose pack

## 2010-07-30 ENCOUNTER — Telehealth: Payer: Self-pay | Admitting: Orthopedic Surgery

## 2010-07-30 NOTE — Telephone Encounter (Signed)
[name], Manson Allan Rico's daughter wants you to call her about the new prescription given to [facility] yesterday and to discuss yesterday's appointment.  Her # [phone]

## 2010-08-01 ENCOUNTER — Encounter (HOSPITAL_COMMUNITY): Payer: Self-pay

## 2010-08-01 ENCOUNTER — Emergency Department (HOSPITAL_COMMUNITY): Payer: Medicare Other

## 2010-08-01 ENCOUNTER — Emergency Department (HOSPITAL_COMMUNITY)
Admission: EM | Admit: 2010-08-01 | Discharge: 2010-08-01 | Disposition: A | Discharge status: Home / Self Care | Payer: Medicare Other | Attending: Emergency Medicine | Admitting: Emergency Medicine

## 2010-08-01 ENCOUNTER — Telehealth: Payer: Self-pay | Admitting: Family Medicine

## 2010-08-01 DIAGNOSIS — Z96659 Presence of unspecified artificial knee joint: Secondary | ICD-10-CM | POA: Insufficient documentation

## 2010-08-01 DIAGNOSIS — M25569 Pain in unspecified knee: Secondary | ICD-10-CM | POA: Insufficient documentation

## 2010-08-01 DIAGNOSIS — Z862 Personal history of diseases of the blood and blood-forming organs and certain disorders involving the immune mechanism: Secondary | ICD-10-CM | POA: Insufficient documentation

## 2010-08-01 DIAGNOSIS — Z86718 Personal history of other venous thrombosis and embolism: Secondary | ICD-10-CM | POA: Insufficient documentation

## 2010-08-01 DIAGNOSIS — M1990 Unspecified osteoarthritis, unspecified site: Secondary | ICD-10-CM | POA: Insufficient documentation

## 2010-08-01 DIAGNOSIS — J449 Chronic obstructive pulmonary disease, unspecified: Secondary | ICD-10-CM | POA: Insufficient documentation

## 2010-08-01 DIAGNOSIS — E785 Hyperlipidemia, unspecified: Secondary | ICD-10-CM | POA: Insufficient documentation

## 2010-08-01 DIAGNOSIS — J4489 Other specified chronic obstructive pulmonary disease: Secondary | ICD-10-CM | POA: Insufficient documentation

## 2010-08-01 DIAGNOSIS — G8929 Other chronic pain: Secondary | ICD-10-CM | POA: Insufficient documentation

## 2010-08-01 DIAGNOSIS — W19XXXA Unspecified fall, initial encounter: Secondary | ICD-10-CM | POA: Insufficient documentation

## 2010-08-01 DIAGNOSIS — M25561 Pain in right knee: Secondary | ICD-10-CM

## 2010-08-01 DIAGNOSIS — F17200 Nicotine dependence, unspecified, uncomplicated: Secondary | ICD-10-CM | POA: Insufficient documentation

## 2010-08-01 DIAGNOSIS — I10 Essential (primary) hypertension: Secondary | ICD-10-CM | POA: Insufficient documentation

## 2010-08-01 MED ORDER — HYDROCODONE-ACETAMINOPHEN 5-325 MG PO TABS: 2.0000 | ORAL_TABLET | Freq: Once | ORAL | Status: AC

## 2010-08-01 MED ORDER — GABAPENTIN 300 MG PO CAPS: 300.0000 mg | ORAL_CAPSULE | Freq: Three times a day (TID) | ORAL | Status: DC

## 2010-08-01 MED ORDER — HYDROCODONE-ACETAMINOPHEN 5-325 MG PO TABS
2.0000 | ORAL_TABLET | Freq: Once | ORAL | Status: AC
  Administered 2010-08-01: 2 via ORAL
  Filled 2010-08-01: qty 2

## 2010-08-01 NOTE — Telephone Encounter (Signed)
pls advise since she fell needs to go to the ED to be evaluated , she has had surgery on her legs and is having groin pain

## 2010-08-01 NOTE — ED Notes (Signed)
Pt presents with bilat knee pain but right knee worse than left. Pt states pain is continuous. Pt to triage via w/c. NAD at this time.

## 2010-08-01 NOTE — Telephone Encounter (Signed)
CALLED PATIENT, NO ANSWER °

## 2010-08-01 NOTE — ED Notes (Signed)
Patient is resting comfortably. 

## 2010-08-01 NOTE — Telephone Encounter (Signed)
This patient would need to go to er for eval correct?

## 2010-08-01 NOTE — ED Provider Notes (Signed)
History     Chief Complaint  Patient presents with  . Knee Pain   Patient is a 77 y.o. female presenting with knee pain. The history is provided by the patient.  Knee Pain This is a chronic (Patient has had pain in her right knee since she had TKR 2 years ago by Dr Romeo Apple) problem. The problem occurs constantly. The problem has been gradually worsening. Associated symptoms include abdominal pain. Pertinent negatives include no chest pain, no headaches and no shortness of breath. The symptoms are aggravated by walking. The symptoms are relieved by nothing.  Pt states she saw Dr Romeo Apple 3 days ago and he started her on medrol dose pack which was helping but yesterday she was getting out of her truck and her cane slipped and she fell forward. States her knee is hurting  worse since then. Has an appt to be seen by Dr Lodema Hong in 3 days. Is upset because Dr Romeo Apple "didn't give me a shot in the butt".  Reviewed NCCSR and she hasn't had any narcotics in past 6 months.   Past Medical History  Diagnosis Date  . Chest pain 2006  . COPD (chronic obstructive pulmonary disease)   . Hypertension     ECG in 2006 mild to moderate left ventricular hypertrophy   . Hyperlipidemia   . Osteoarthritis     status post left TKA; surgery on the right is anticipated in the near future   . Nicotine addiction   . Gastroesophageal reflux disease   . Hiatal hernia   . Anemia     iron deficiency post op.  . Chronic back pain   . Pulmonary nodules     stable since 2006  . Cellulitis of finger     right   . Shingles     right breast   . Ulcer     gential  . Skin infection     Past Surgical History  Procedure Date  . Cholecystectomy   . Combined hysterectomy abdominal w/ a&p repair / oophorectomy approx. 40 years ago   . Mastectomy 1998 & 2007     right -1998 / left 2007  . Total knee arthroplasty 4/08    left   . Right knee replacement 09/2009    Dr. Romeo Apple    Family History  Problem Relation  Age of Onset  . Cancer Mother 51    breast   . Cancer Father 34    prostate  . Hypertension Father   . Hypertension Sister   . Asthma Sister     History  Substance Use Topics  . Smoking status: Current Everyday Smoker -- 0.5 packs/day    Types: Cigarettes  . Smokeless tobacco: Not on file  . Alcohol Use: No    OB History    Grav Para Term Preterm Abortions TAB SAB Ect Mult Living                  Review of Systems  Constitutional: Negative.  Negative for fever, activity change and appetite change.  Eyes: Negative.   Respiratory: Negative.  Negative for shortness of breath.   Cardiovascular: Negative.  Negative for chest pain.  Gastrointestinal: Positive for abdominal pain.  Neurological: Negative for headaches.  All other systems reviewed and are negative.    Physical Exam  BP 146/71  Pulse 69  Temp(Src) 98.3 F (36.8 C) (Oral)  Resp 16  Ht 5\' 7"  (1.702 m)  SpO2 100%  Physical Exam  Constitutional: She  is oriented to person, place, and time. She appears well-developed and well-nourished.  Non-toxic appearance. She does not appear ill. No distress.  HENT:  Head: Normocephalic and atraumatic.  Right Ear: External ear normal.  Left Ear: External ear normal.  Nose: Nose normal.  Eyes: Conjunctivae and EOM are normal. Pupils are equal, round, and reactive to light.  Neck: Normal range of motion and full passive range of motion without pain.  Cardiovascular: Normal rate.  Exam reveals no gallop and no friction rub.   No murmur heard. Pulmonary/Chest: Effort normal. No respiratory distress. She has no wheezes. She has no rhonchi. She has no rales. She exhibits no tenderness and no crepitus.  Abdominal: She exhibits no distension.  Musculoskeletal: Normal range of motion. She exhibits no edema and no tenderness.       Right knee: She exhibits no swelling, no effusion, no deformity and no erythema. tenderness found.       PT has well healed midline scar. No bruising,  abrasions  Neurological: She is alert and oriented to person, place, and time. She has normal strength. No cranial nerve deficit.  Skin: Skin is warm, dry and intact. No rash noted. No erythema. No pallor.  Psychiatric: Her speech is normal and behavior is normal. Her mood appears not anxious. Her affect is blunt.    ED Course  Procedures  MDM  Results for orders placed in visit on 02/13/10  HM MAMMOGRAPHY      Component Value Range   HM Mammogram abnormal    HM COLONOSCOPY      Component Value Range   HM Colonoscopy abnormal     Dg Knee Complete 4 Views Right  08/01/2010  *RADIOLOGY REPORT*  Clinical Data: Right knee pain post fall  RIGHT KNEE - COMPLETE 4+ VIEW  Comparison: 09/24/2009  Findings: Components of right knee prosthesis in expected positions. Osseous mineralization. No acute fracture, dislocation or bone destruction. No periprosthetic lucency or knee joint effusion. Scattered clothing/bedding artifacts. Soft tissues unremarkable.  IMPRESSION: Osseous demineralization. Right total knee arthroplasty. No acute abnormalities.  Original Report Authenticated By: Lollie Marrow, M.D.   Pt given oral pain meds.       Ward Givens, MD 08/01/10 1610

## 2010-08-01 NOTE — ED Notes (Signed)
Pt reports had r knee replacement approx  2 years ago and has had pain since.  Says yesterday she fell onto grass and pain worsened after fall.  No obvious swelling or deformity noted.  PT walks with walker.

## 2010-08-04 ENCOUNTER — Encounter: Payer: Self-pay | Admitting: Family Medicine

## 2010-08-04 ENCOUNTER — Ambulatory Visit (INDEPENDENT_AMBULATORY_CARE_PROVIDER_SITE_OTHER): Payer: Medicare Other | Admitting: Family Medicine

## 2010-08-04 VITALS — BP 150/80 | HR 74 | Resp 16 | Ht 65.0 in | Wt 195.0 lb

## 2010-08-04 DIAGNOSIS — I10 Essential (primary) hypertension: Secondary | ICD-10-CM

## 2010-08-04 DIAGNOSIS — E785 Hyperlipidemia, unspecified: Secondary | ICD-10-CM

## 2010-08-04 DIAGNOSIS — D509 Iron deficiency anemia, unspecified: Secondary | ICD-10-CM

## 2010-08-04 DIAGNOSIS — R5381 Other malaise: Secondary | ICD-10-CM

## 2010-08-04 DIAGNOSIS — F17200 Nicotine dependence, unspecified, uncomplicated: Secondary | ICD-10-CM

## 2010-08-04 DIAGNOSIS — M899 Disorder of bone, unspecified: Secondary | ICD-10-CM

## 2010-08-04 DIAGNOSIS — R5383 Other fatigue: Secondary | ICD-10-CM

## 2010-08-04 DIAGNOSIS — R0989 Other specified symptoms and signs involving the circulatory and respiratory systems: Secondary | ICD-10-CM

## 2010-08-04 DIAGNOSIS — M949 Disorder of cartilage, unspecified: Secondary | ICD-10-CM

## 2010-08-04 DIAGNOSIS — M1990 Unspecified osteoarthritis, unspecified site: Secondary | ICD-10-CM

## 2010-08-04 DIAGNOSIS — M25569 Pain in unspecified knee: Secondary | ICD-10-CM

## 2010-08-04 MED ORDER — KETOROLAC TROMETHAMINE 60 MG/2ML IM SOLN
60.0000 mg | Freq: Once | INTRAMUSCULAR | Status: AC
  Administered 2010-08-04: 60 mg via INTRAMUSCULAR

## 2010-08-04 MED ORDER — METHYLPREDNISOLONE ACETATE 80 MG/ML IJ SUSP
80.0000 mg | Freq: Once | INTRAMUSCULAR | Status: AC
  Administered 2010-08-04: 80 mg via INTRAMUSCULAR

## 2010-08-04 NOTE — Telephone Encounter (Signed)
Patient being seen in office today.

## 2010-08-04 NOTE — Progress Notes (Signed)
  Subjective:    Patient ID: Jennifer [name], female    DOB: 1922/06/08, 77 y.o.   MRN: [id number]  HPI Pt fell face forward when getting out of her truck  5 days ago. Was evaluated in the Ed 3 days ago, knees show no fracture. Requests 2nd ortho opinion, however I advise with a normal x ray, and 2 previous knee surgeries, there was little to nothng else a 2nd orthopod could do. She still smokes andis unwilling to quit at this time.    Review of Systems Denies recent fever or chills. Denies sinus pressure, nasal congestion, ear pain or sore throat. Denies chest congestion, productive cough or wheezing. Denies chest pains, palpitations and leg swelling Denies abdominal pain, nausea, vomiting,diarrhea or constipation.   Denies dysuria, frequency, hesitancy or incontinence.  Denies headaches, seizures, numbness, or tingling. Denies depression, anxiety or insomnia. Denies skin break down or rash.        Objective:   Physical Exam Patient alert and oriented and in no cardiopulmonary distress.  HEENT: No facial asymmetry, EOMI, no sinus tenderness,  oropharynx pink and moist.  Neck decreased ROM no adenopathy.Carotid bruits  Chest: Clear to auscultation bilaterally.decreased air entry   CVS: S1, S2 no murmurs, no S3.  ABD: Soft non tender. Bowel sounds normal.  Ext: No edema  MS: decreased  ROM spine, shoulders, hips and knees.  Skin: Intact, no ulcerations or rash noted.  Psych: Good eye contact, normal affect. Memory impaired not anxious or depressed appearing.  CNS: CN 2-12 intact, power, tone and sensation normal throughout.        Assessment & Plan:

## 2010-08-04 NOTE — Patient Instructions (Addendum)
F/u in 6 weeks  You will be referred for physical therapy and also for a study to see if you have blockage in your neck arteries  Blood pressure is high you need to resume your med today, benazepril/hctz  One daily  Sent to [facility]  Injection for knee pain today  Fasting labs today

## 2010-08-06 ENCOUNTER — Other Ambulatory Visit: Payer: Self-pay | Admitting: Orthopedic Surgery

## 2010-08-06 ENCOUNTER — Telehealth: Payer: Self-pay | Admitting: Orthopedic Surgery

## 2010-08-06 NOTE — Telephone Encounter (Signed)
Patient called, left voice mail message asking about a prescription. I returned call, and she related that she had been to [facility] Emergency Room this past weekend.  Said she had fallen, face down, in front of her truck.  She said she then drove herself to the Emergency Rm, had Xrays, "nothing broken", and followed up with Dr. Lodema Hong on Monday as advised.  She states Dr. Lodema Hong gave her an injection in both hips, but she is still hurting and said was not given any new prescription for pain.   She is re-checking with Dr. Anthony Sar office and is calling her pharmacy, [facility], and may have them fax a request re: refill of her last pain medication.

## 2010-08-07 ENCOUNTER — Ambulatory Visit (HOSPITAL_COMMUNITY): Payer: Medicare Other

## 2010-08-10 NOTE — Assessment & Plan Note (Signed)
Deteriorated, recent fall , and uncontrolled pain, toradol administered, and PT referral made

## 2010-08-10 NOTE — Assessment & Plan Note (Signed)
Medication to be started at this visit.Uncontrolled

## 2010-08-10 NOTE — Assessment & Plan Note (Signed)
Recent possible near syncopal event, will check for steosis

## 2010-08-10 NOTE — Assessment & Plan Note (Signed)
Unchanged, counseled to quit 

## 2010-08-12 ENCOUNTER — Ambulatory Visit (HOSPITAL_COMMUNITY)
Admission: RE | Admit: 2010-08-12 | Discharge: 2010-08-12 | Disposition: A | Discharge status: Home / Self Care | Payer: Medicare Other | Source: Ambulatory Visit | Attending: Family Medicine | Admitting: Family Medicine

## 2010-08-12 DIAGNOSIS — J449 Chronic obstructive pulmonary disease, unspecified: Secondary | ICD-10-CM | POA: Insufficient documentation

## 2010-08-12 DIAGNOSIS — M25559 Pain in unspecified hip: Secondary | ICD-10-CM | POA: Insufficient documentation

## 2010-08-12 DIAGNOSIS — R262 Difficulty in walking, not elsewhere classified: Secondary | ICD-10-CM | POA: Insufficient documentation

## 2010-08-12 DIAGNOSIS — M549 Dorsalgia, unspecified: Secondary | ICD-10-CM | POA: Insufficient documentation

## 2010-08-12 DIAGNOSIS — J4489 Other specified chronic obstructive pulmonary disease: Secondary | ICD-10-CM | POA: Insufficient documentation

## 2010-08-12 DIAGNOSIS — IMO0001 Reserved for inherently not codable concepts without codable children: Secondary | ICD-10-CM | POA: Insufficient documentation

## 2010-08-12 DIAGNOSIS — M25569 Pain in unspecified knee: Secondary | ICD-10-CM | POA: Insufficient documentation

## 2010-08-12 DIAGNOSIS — M6281 Muscle weakness (generalized): Secondary | ICD-10-CM | POA: Insufficient documentation

## 2010-08-12 DIAGNOSIS — I10 Essential (primary) hypertension: Secondary | ICD-10-CM | POA: Insufficient documentation

## 2010-08-12 NOTE — Patient Instructions (Addendum)
HEP

## 2010-08-12 NOTE — Progress Notes (Signed)
Physical Therapy Evaluation  Patient Name: Jennifer [name] [id number] Date: 08/12/2010 HPI:  Personal Hx of falling, weakness, difficulty walking, back, hip and knee pain. Symptoms/Limitations Symptoms: Pt states that she got out of her truck on 08/01/10. She does not know what happened but she fell injuring her back.  She got a shot in each hip but the pain is now the pain is back.  The patient states that her knees are hurting her more than her back or hips at this time.  The patient states that her knees have bothered her ever since she had TKR the L 3 yrs argo and the  R over a year ago.   How long can you sit comfortably?: Pt states that she she can sit 30 minutes but her knees bother her. How long can you stand comfortably?: unable to stand greater than 5-10 minutes. How long can you walk comfortably?: Pt ambulates with her walker in the house only.  She is only able to walk less than 5 min.  The patient was using her cane to walk prior to her fall but is now  having to use her walker. Pain Assessment Currently in Pain?: Yes Pain Score:   9 Pain Location: Back Pain Type: Acute pain Pain Onset: 1 to 4 weeks ago Pain Frequency: Constant Pain Relieving Factors: ice helps  Effect of Pain on Daily Activities: increases pain. Multiple Pain Sites: Yes Past Medical History:  Past Medical History  Diagnosis Date  . Chest pain 2006  . COPD (chronic obstructive pulmonary disease)   . Hypertension     ECG in 2006 mild to moderate left ventricular hypertrophy   . Hyperlipidemia   . Osteoarthritis     status post left TKA; surgery on the right is anticipated in the near future   . Nicotine addiction   . Gastroesophageal reflux disease   . Hiatal hernia   . Anemia     iron deficiency post op.  . Chronic back pain   . Pulmonary nodules     stable since 2006  . Cellulitis of finger     right   . Shingles     right breast   . Ulcer     gential  . Skin infection    Past Surgical History:    Past Surgical History  Procedure Date  . Cholecystectomy   . Combined hysterectomy abdominal w/ a&p repair / oophorectomy approx. 40 years ago   . Mastectomy 1998 & 2007     right -1998 / left 2007  . Total knee arthroplasty 4/08    left   . Right knee replacement 09/2009    Dr. Romeo Apple    Precautions/Restrictions  Precautions Precautions: Fall  Prior Functioning  Home Living Type of Home: House Lives With: Alone Bathroom Shower/Tub: Tub/shower unit Prior Function Level of Independence: Independent with basic ADLs Driving: Yes Able to Take Stairs Reciprically: Yes Vocation: Retired Leisure: Hobbies-no  Cognition Cognition Overall Cognitive Status: Appears within functional limits for tasks assessed Arousal/Alertness: Awake/alert Orientation Level: Oriented X4  Sensation/Coordination/Flexibility    Assessment RLE Strength Right Hip Flexion: 2+/5 Right Hip Extension: 3/5 Right Hip ABduction: 2+/5 Right Hip ADduction: 2+/5 Right Knee Flexion: 2+/5 Right Knee Extension: 3+/5 Right Ankle Dorsiflexion: 4/5 LLE Strength Left Hip Flexion: 2+/5 Left Hip Extension: 2+/5 Left Hip ABduction: 2+/5 Left Hip ADduction: 2+/5 Left Knee Flexion: 3/5 Left Knee Extension: 3/5 Left Ankle Dorsiflexion: 4/5 Lumbar Assessment Lumbar Assessment: Exceptions to [facility] Lumbar AROM Lumbar Flexion:  (  wnl with patient forward bent at 20 degrees.) Lumbar Extension: to neutral Lumbar - Right Side Bend:  (decreased 30%) Lumbar - Left Side Bend: decreased 30% Lumbar - Right Rotation: decreased 30% Lumbar - Left Rotation:  (decreased 50%)  Mobility (including Balance)       Exercise/Treatments      Goals PT Short Term Goals Short Term Goal 1: I HEP Short Term Goal 2: no falls in 2 weeks PT Long Term Goals Long Term Goal 1: I advance HEP Long Term Goal 2: Pt strength to be increased by 1 level Long Term Goal 3: Pt ambulating with a cane in her house Long Term Goal 4: No  falls End of Session Patient Active Problem List  Diagnoses  . DERMATOMYCOSIS  . DYSLIPIDEMIA  . ANEMIA-IRON DEFICIENCY  . TOBACCO ABUSE  . CORONARY ARTERY DISEASE  . CEREBROVASCULAR DISEASE  . VARICOSE VEINS LOWER EXTREMITIES W/INFLAMMATION  . COPD  . GASTROESOPHAGEAL REFLUX DISEASE  . HIATAL HERNIA WITH REFLUX  . UNSPECIFIED HEPATITIS  . ACUTE CYSTITIS  . CELLULITIS AND ABSCESS OF UNSPECIFIED DIGIT  . CELLULITIS AND ABSCESS OF UNSPECIFIED SITE  . OSTEOARTHRITIS  . Osteoarth NOS-L/Leg  . ARTHRITIS, LEFT ANKLE  . KNEE PAIN  . SPINAL STENOSIS  . BACK PAIN  . ANSERINE BURSITIS  . FATIGUE  . LEG EDEMA  . CHEST PAIN UNSPECIFIED  . BREAST CANCER, HX OF  . SHINGLES, HX OF  . TOTAL KNEE FOLLOW-UP  . Hypertension  . Carotid artery bruit  . Muscle weakness (generalized)   PT - End of Session Activity Tolerance: Patient tolerated treatment well General Behavior During Session: [facility] for tasks performed Cognition: [facility] for tasks performed PT Assessment and Plan Clinical Impression Statement: Pt with significant  weakness of her core and LE mm causing pain and increased risk of falling.  Pt will benefit from skilled physical therapy to improve functional ability and reduce her risk of falling.  Rehab Potential: Fair PT Frequency: Min 1X/week PT Duration: 6 weeks PT Treatment/Interventions: Therapeutic exercise PT Plan: see one time a week for 6 weeks.  Pt is unable to tolerate supine position for long periods of time.  Exercises shown must be chair exercises or standing with bed/couch behind her.  May begin functional squats, heel raises., marching, hip abduction, extensin while standing.  Pt will need pictures of these.  Time Calculation Start Time: 1130 Stop Time: 1215 Time Calculation (min): 45 min  Shaheim Mahar,[name] 08/12/2010, 12:31 PM

## 2010-08-20 ENCOUNTER — Ambulatory Visit (HOSPITAL_COMMUNITY)
Admission: RE | Admit: 2010-08-20 | Discharge: 2010-08-20 | Disposition: A | Discharge status: Home / Self Care | Payer: Medicare Other | Source: Ambulatory Visit | Attending: Family Medicine | Admitting: Family Medicine

## 2010-08-20 DIAGNOSIS — IMO0001 Reserved for inherently not codable concepts without codable children: Secondary | ICD-10-CM | POA: Insufficient documentation

## 2010-08-20 DIAGNOSIS — I10 Essential (primary) hypertension: Secondary | ICD-10-CM | POA: Insufficient documentation

## 2010-08-20 DIAGNOSIS — J4489 Other specified chronic obstructive pulmonary disease: Secondary | ICD-10-CM | POA: Insufficient documentation

## 2010-08-20 DIAGNOSIS — M25569 Pain in unspecified knee: Secondary | ICD-10-CM | POA: Insufficient documentation

## 2010-08-20 DIAGNOSIS — J449 Chronic obstructive pulmonary disease, unspecified: Secondary | ICD-10-CM | POA: Insufficient documentation

## 2010-08-20 DIAGNOSIS — M549 Dorsalgia, unspecified: Secondary | ICD-10-CM | POA: Insufficient documentation

## 2010-08-20 DIAGNOSIS — M25559 Pain in unspecified hip: Secondary | ICD-10-CM | POA: Insufficient documentation

## 2010-08-20 DIAGNOSIS — R262 Difficulty in walking, not elsewhere classified: Secondary | ICD-10-CM | POA: Insufficient documentation

## 2010-08-20 NOTE — Patient Instructions (Signed)
HEP

## 2010-08-20 NOTE — Progress Notes (Signed)
Physical Therapy Treatment Patient Name: CHRISHONDA HESCH [id number] Date: 08/20/2010  HPI:Hx of falling and weakness Symptoms/Limitations Symptoms: Pt states that her knees and back are still hurting.  Pt states that she is trying do do the exercises that were given her last week. Pain Assessment Currently in Pain?: Yes Pain Score: 10-Worst pain ever Pain Location: Back Pain Type: Chronic pain Pain Onset: 1 to 4 weeks ago Pain Frequency: Constant Pain Relieving Factors: sitting down/heat Multiple Pain Sites: Yes  Precautions/Restrictions     Mobility (including Balance)       Exercise/Treatments  Lumbar Stretches Standing Extension: 5 reps Lumbar Exercises Scapular Retraction: 10 reps Stability Exercises Bridge:  (glut sets sitting 10x) Ab Set: 10 reps Isometric Hip Flexion:  (hip flexion while sitting 10 rep) Hip Exercises Hip Extension: 10 reps;Standing Hip ABduction/ADduction: 10 reps;Standing Additional Hip Exercises Mini-Sqauts: 10 reps Knee Exercises Knee Extension: Both;10 reps;Seated Ankle Exercises Ankle Dorsiflexion: AROM;Both;10 reps;Seated Ankle Plantar Flexion: AROM;Both;10 reps;Seated    Goals increased strength, no falls   End of Session Patient Active Problem List  Diagnoses  . DERMATOMYCOSIS  . DYSLIPIDEMIA  . ANEMIA-IRON DEFICIENCY  . TOBACCO ABUSE  . CORONARY ARTERY DISEASE  . CEREBROVASCULAR DISEASE  . VARICOSE VEINS LOWER EXTREMITIES W/INFLAMMATION  . COPD  . GASTROESOPHAGEAL REFLUX DISEASE  . HIATAL HERNIA WITH REFLUX  . UNSPECIFIED HEPATITIS  . ACUTE CYSTITIS  . CELLULITIS AND ABSCESS OF UNSPECIFIED DIGIT  . CELLULITIS AND ABSCESS OF UNSPECIFIED SITE  . OSTEOARTHRITIS  . Osteoarth NOS-L/Leg  . ARTHRITIS, LEFT ANKLE  . KNEE PAIN  . SPINAL STENOSIS  . BACK PAIN  . ANSERINE BURSITIS  . FATIGUE  . LEG EDEMA  . CHEST PAIN UNSPECIFIED  . BREAST CANCER, HX OF  . SHINGLES, HX OF  . TOTAL KNEE FOLLOW-UP  . Hypertension  . Carotid  artery bruit  . Muscle weakness (generalized)   PT - End of Session Activity Tolerance: Patient limited by fatigue General Behavior During Session: [facility] for tasks performed Cognition: [facility] for tasks performed PT Assessment and Plan Clinical Impression Statement: Pt needs multiple rest breaks Rehab Potential: Good PT Frequency: Min 1X/week PT Duration: 6 weeks PT Treatment/Interventions: Therapeutic exercise;Other (comment) (HMP to back and knees) PT Plan: Continue to see patient.  RUSSELL,[name] 08/20/2010, 9:29 AM

## 2010-08-27 ENCOUNTER — Ambulatory Visit (HOSPITAL_COMMUNITY)
Admission: RE | Admit: 2010-08-27 | Discharge: 2010-08-27 | Disposition: A | Discharge status: Home / Self Care | Payer: Medicare Other | Source: Ambulatory Visit | Attending: Physical Therapy | Admitting: Physical Therapy

## 2010-08-27 NOTE — Progress Notes (Signed)
Physical Therapy Treatment Patient Name: ERMEL VERNE ZOXWR'U Date: 08/27/2010  Visit #:3/3 Time in: 8:42 Time out: 9:26 Initial Evaluation Date:08/12/2010 Charges: MHP x 1 unit ; Therex x 25    HPI: Symptoms/Limitations Symptoms: My back and both knees hurt. Pain Assessment Pain Score: 10-Worst pain ever Pain Location: Back   Exercise/Treatments  Standing: Standing Extension: 5 reps Hip Extension: 10 reps  Seated: Scapular Retraction: 10 reps Glut sets10x Ab Set: 10 reps Seated march 10 reps Heel Raises: 10 reps Toe Raise: 10 reps Long Arc Quad: 5 reps Hip ADduction: 10 reps;Both (isometric ball squeeze seated)   Modalities Modalities: Moist Heat Moist Heat Therapy Number Minutes Moist Heat: 15 Minutes Moist Heat Location: Other (comment) (B knees and low back) (At beginning of tx)  Goals PT Short Term Goals Short Term Goal 1: I HEP Short Term Goal 1 Progress: Progressing toward goal Short Term Goal 2: no falls in 2 weeks Short Term Goal 2 Progress: Progressing toward goal PT Long Term Goals Long Term Goal 1: I advance HEP Long Term Goal 1 Progress: Progressing toward goal Long Term Goal 2: Pt strength to be increased by 1 level Long Term Goal 2 Progress: Progressing toward goal Long Term Goal 3: Pt ambulating with a cane in her house Long Term Goal 3 Progress: Progressing toward goal Long Term Goal 4: No falls Long Term Goal 4 Progress: Progressing toward goal End of Session Patient Active Problem List  Diagnoses  . DERMATOMYCOSIS  . DYSLIPIDEMIA  . ANEMIA-IRON DEFICIENCY  . TOBACCO ABUSE  . CORONARY ARTERY DISEASE  . CEREBROVASCULAR DISEASE  . VARICOSE VEINS LOWER EXTREMITIES W/INFLAMMATION  . COPD  . GASTROESOPHAGEAL REFLUX DISEASE  . HIATAL HERNIA WITH REFLUX  . UNSPECIFIED HEPATITIS  . ACUTE CYSTITIS  . CELLULITIS AND ABSCESS OF UNSPECIFIED DIGIT  . CELLULITIS AND ABSCESS OF UNSPECIFIED SITE  . OSTEOARTHRITIS  . Osteoarth NOS-L/Leg    . ARTHRITIS, LEFT ANKLE  . KNEE PAIN  . SPINAL STENOSIS  . BACK PAIN  . ANSERINE BURSITIS  . FATIGUE  . LEG EDEMA  . CHEST PAIN UNSPECIFIED  . BREAST CANCER, HX OF  . SHINGLES, HX OF  . TOTAL KNEE FOLLOW-UP  . Hypertension  . Carotid artery bruit  . Muscle weakness (generalized)   PT - End of Session Activity Tolerance: Patient tolerated treatment well General Behavior During Session: [facility] for tasks performed Cognition: [facility] for tasks performed PT Assessment and Plan Clinical Impression Statement: Pt requires frequent rest breaks durring therapy. Pt with decreased tolerance for standing therex. MHP applied before therex to decrease pain. Pt with pain decrease to 5/10 after MHP. PT Treatment/Interventions: Therapeutic exercise;Other (comment) (MHP to low back and B knees) PT Plan: Continue to progress per PT POC.  Seth Bake [facility] 08/27/2010, 11:34 AM

## 2010-09-03 ENCOUNTER — Ambulatory Visit (HOSPITAL_COMMUNITY)
Admission: RE | Admit: 2010-09-03 | Discharge: 2010-09-03 | Disposition: A | Discharge status: Home / Self Care | Payer: Medicare Other | Source: Ambulatory Visit | Attending: Physical Therapy | Admitting: Physical Therapy

## 2010-09-03 NOTE — Progress Notes (Signed)
Physical Therapy Treatment Patient Details  Name: Jennifer [name] MRN: [id number] Date of Birth: April 30, 1922  Today's Date: 09/03/2010 Time: 7829-5621 Time Calculation (min): 52 min Visit#: 4 Re-eval: 09/09/10 Charges: MHP x 1 unit; Therex x  30'  Subjective: Symptoms/Limitations Symptoms: I'm still hurting in both knees and my back. Pain Assessment Currently in Pain?: Yes Pain Score:   8 Pain Location: Back Pain Orientation: Lower   Exercise/Treatments  Standing:  Hip Extension: 10 reps  Heel Raises: 10 reps  Hip abduction 10 reps  Seated:  Scapular Retraction: 10 reps  Glut sets10x  Ab Set: 10 reps  Seated march 10 reps  Toe Raise: 15 reps  Long Arc Quad: 10 reps  Hip ADduction: 15 reps;Both (isometric ball squeeze seated)   Modalities Modalities: Moist Heat Moist Heat Therapy Number Minutes Moist Heat: 15 Minutes Moist Heat Location: Other (comment) (B knees and lumbar)  Physical Therapy Assessment and Plan PT Assessment and Plan Clinical Impression Statement: Pt seems to display increased therex tolerance though pt continues to require frequent rest breaks between standing exercises.  PT Treatment/Interventions: Therapeutic exercise;Other (comment) (MHP x15') PT Plan: Reassess next tx.    Goals PT Short Term Goals PT Short Term Goal 1: I HEP PT Short Term Goal 1 - Progress: Progressing toward goal PT Short Term Goal 2: no falls in 2 weeks PT Short Term Goal 2 - Progress: Progressing toward goal PT Long Term Goals PT Long Term Goal 1: I advance HEP PT Long Term Goal 1 - Progress: Progressing toward goal PT Long Term Goal 2: Pt strength to be increased by 1 level PT Long Term Goal 2 - Progress: Progressing toward goal Long Term Goal 3: Pt ambulating with a cane in her house Long Term Goal 3 Progress: Progressing toward goal Long Term Goal 4: No falls  Problem List Patient Active Problem List  Diagnoses  . DERMATOMYCOSIS  . DYSLIPIDEMIA  .  ANEMIA-IRON DEFICIENCY  . TOBACCO ABUSE  . CORONARY ARTERY DISEASE  . CEREBROVASCULAR DISEASE  . VARICOSE VEINS LOWER EXTREMITIES W/INFLAMMATION  . COPD  . GASTROESOPHAGEAL REFLUX DISEASE  . HIATAL HERNIA WITH REFLUX  . UNSPECIFIED HEPATITIS  . ACUTE CYSTITIS  . CELLULITIS AND ABSCESS OF UNSPECIFIED DIGIT  . CELLULITIS AND ABSCESS OF UNSPECIFIED SITE  . OSTEOARTHRITIS  . Osteoarth NOS-L/Leg  . ARTHRITIS, LEFT ANKLE  . KNEE PAIN  . SPINAL STENOSIS  . BACK PAIN  . ANSERINE BURSITIS  . FATIGUE  . LEG EDEMA  . CHEST PAIN UNSPECIFIED  . BREAST CANCER, HX OF  . SHINGLES, HX OF  . TOTAL KNEE FOLLOW-UP  . Hypertension  . Carotid artery bruit  . Muscle weakness (generalized)    PT - End of Session Activity Tolerance: Patient tolerated treatment well General Behavior During Session: [facility] for tasks performed Cognition: [facility] for tasks performed  Antonieta Iba 09/03/2010, 9:38 AM

## 2010-09-04 ENCOUNTER — Other Ambulatory Visit: Payer: Self-pay | Admitting: Family Medicine

## 2010-09-05 ENCOUNTER — Encounter: Payer: Self-pay | Admitting: Family Medicine

## 2010-09-05 ENCOUNTER — Telehealth: Payer: Self-pay | Admitting: Family Medicine

## 2010-09-08 ENCOUNTER — Ambulatory Visit (INDEPENDENT_AMBULATORY_CARE_PROVIDER_SITE_OTHER): Payer: Medicare Other | Admitting: Family Medicine

## 2010-09-08 ENCOUNTER — Encounter: Payer: Self-pay | Admitting: Family Medicine

## 2010-09-08 ENCOUNTER — Telehealth: Payer: Self-pay | Admitting: Family Medicine

## 2010-09-08 VITALS — BP 140/80 | HR 79 | Resp 14 | Ht 64.0 in | Wt 190.0 lb

## 2010-09-08 DIAGNOSIS — M949 Disorder of cartilage, unspecified: Secondary | ICD-10-CM

## 2010-09-08 DIAGNOSIS — J449 Chronic obstructive pulmonary disease, unspecified: Secondary | ICD-10-CM

## 2010-09-08 DIAGNOSIS — M549 Dorsalgia, unspecified: Secondary | ICD-10-CM

## 2010-09-08 DIAGNOSIS — F17200 Nicotine dependence, unspecified, uncomplicated: Secondary | ICD-10-CM

## 2010-09-08 DIAGNOSIS — E785 Hyperlipidemia, unspecified: Secondary | ICD-10-CM

## 2010-09-08 DIAGNOSIS — M899 Disorder of bone, unspecified: Secondary | ICD-10-CM

## 2010-09-08 DIAGNOSIS — D509 Iron deficiency anemia, unspecified: Secondary | ICD-10-CM

## 2010-09-08 DIAGNOSIS — R5383 Other fatigue: Secondary | ICD-10-CM

## 2010-09-08 DIAGNOSIS — I10 Essential (primary) hypertension: Secondary | ICD-10-CM

## 2010-09-08 DIAGNOSIS — M25569 Pain in unspecified knee: Secondary | ICD-10-CM

## 2010-09-08 LAB — BASIC METABOLIC PANEL
Chloride: 106 mEq/L (ref 96–112)
Potassium: 4.4 mEq/L (ref 3.5–5.3)
Sodium: 144 mEq/L (ref 135–145)

## 2010-09-08 LAB — CBC WITH DIFFERENTIAL/PLATELET
Basophils Absolute: 0 10*3/uL (ref 0.0–0.1)
HCT: 42.1 % (ref 36.0–46.0)
Lymphocytes Relative: 32 % (ref 12–46)
Lymphs Abs: 1.5 10*3/uL (ref 0.7–4.0)
Neutro Abs: 2.5 10*3/uL (ref 1.7–7.7)
Platelets: 215 10*3/uL (ref 150–400)
RBC: 4.33 MIL/uL (ref 3.87–5.11)
RDW: 15.4 % (ref 11.5–15.5)
WBC: 4.7 10*3/uL (ref 4.0–10.5)

## 2010-09-08 LAB — LIPID PANEL
HDL: 60 mg/dL (ref >39)
LDL Cholesterol: 95 mg/dL (ref 0–99)
Total CHOL/HDL Ratio: 2.8 Ratio
Triglycerides: 65 mg/dL (ref <150)
VLDL: 13 mg/dL (ref 0–40)

## 2010-09-08 LAB — HEPATIC FUNCTION PANEL
ALT: 10 U/L (ref 0–35)
Albumin: 3.8 g/dL (ref 3.5–5.2)
Indirect Bilirubin: 0.3 mg/dL (ref 0.0–0.9)
Total Protein: 6.5 g/dL (ref 6.0–8.3)

## 2010-09-08 MED ORDER — ACETAMINOPHEN 500 MG PO TABS: ORAL_TABLET | ORAL | Status: DC

## 2010-09-08 MED ORDER — LIDOCAINE 5 % EX PTCH: 3.0000 | MEDICATED_PATCH | Freq: Two times a day (BID) | CUTANEOUS | Status: DC

## 2010-09-08 NOTE — Patient Instructions (Addendum)
F/u in 6 weeks.  New patch for arthritis pain   Labs today  Continue therapy, and do not fall   You need to stop smoking

## 2010-09-09 MED ORDER — OLOPATADINE HCL 0.2 % OP SOLN: 1.0000 [drp] | Freq: Every day | OPHTHALMIC | Status: DC

## 2010-09-09 NOTE — Telephone Encounter (Signed)
REFILL SENT AS REQUESTED ?

## 2010-09-10 ENCOUNTER — Ambulatory Visit (HOSPITAL_COMMUNITY)
Admission: RE | Admit: 2010-09-10 | Discharge: 2010-09-10 | Disposition: A | Discharge status: Home / Self Care | Payer: Medicare Other | Source: Ambulatory Visit | Attending: Physical Therapy | Admitting: Physical Therapy

## 2010-09-10 DIAGNOSIS — M6281 Muscle weakness (generalized): Secondary | ICD-10-CM

## 2010-09-10 NOTE — Patient Instructions (Signed)
To complete hip ab/ext at kitchen counter

## 2010-09-10 NOTE — Progress Notes (Signed)
Physical Therapy Treatment Patient Details  Name: Jennifer [name] MRN: [id number] Date of Birth: 08-08-1922  Today's Date: 09/10/2010 Time: 4098-1191 Time Calculation (min): 48 min Visit#: 5 of 12 Re-eval: 09/03/10  Subjective: Symptoms/Limitations Symptoms: My back and knees hurt all the time sometimes worse tnan others.  Pt denies losing her balance Pain Assessment Currently in Pain?: Yes Pain Score:   2 Pain Location: Knee Pain Type: Chronic pain Pain Onset: 1 to 4 weeks ago Multiple Pain Sites: Yes  Precautions/Restrictions     Mobility (including Balance)       Exercise/Treatments please see exercise flow sheet. Stretches   Aerobic   Machines for Strengthening   Plyometrics   Standing Heel Raises: 10 reps Lateral Step Up: 5 reps Functional Squat: 10 reps Other Standing Knee Exercises:  (marching in place 10x) Other Standing Knee Exercises:  (hip abduction/extension 4# x10) Seated Long Arc Quad: Right;10 reps (4#) Heel Slides:  (scapular retraction) Other Seated Knee Exercises:  (knee adduction isometric x10) Other Seated Knee Exercises:  (sit to stand x 5) Supine   Sidelying   Prone      Modalities Modalities: Moist Heat Moist Heat Therapy Number Minutes Moist Heat: 15 Minutes Moist Heat Location:  (knees and back)  Physical Therapy Assessment and Plan PT Assessment and Plan Clinical Impression Statement: added weights and more standing actrivity pt tolerated well continut to try and progress standing strengthening/balance activities Rehab Potential: Good PT Frequency: Min 1X/week PT Plan: begin SLS    Goals    Problem List Patient Active Problem List  Diagnoses  . DERMATOMYCOSIS  . DYSLIPIDEMIA  . ANEMIA-IRON DEFICIENCY  . TOBACCO ABUSE  . CORONARY ARTERY DISEASE  . CEREBROVASCULAR DISEASE  . VARICOSE VEINS LOWER EXTREMITIES W/INFLAMMATION  . COPD  . GASTROESOPHAGEAL REFLUX DISEASE  . HIATAL HERNIA WITH REFLUX  . UNSPECIFIED  HEPATITIS  . ACUTE CYSTITIS  . CELLULITIS AND ABSCESS OF UNSPECIFIED DIGIT  . CELLULITIS AND ABSCESS OF UNSPECIFIED SITE  . OSTEOARTHRITIS  . Osteoarth NOS-L/Leg  . ARTHRITIS, LEFT ANKLE  . KNEE PAIN  . SPINAL STENOSIS  . BACK PAIN  . ANSERINE BURSITIS  . FATIGUE  . LEG EDEMA  . CHEST PAIN UNSPECIFIED  . BREAST CANCER, HX OF  . SHINGLES, HX OF  . TOTAL KNEE FOLLOW-UP  . Hypertension  . Carotid artery bruit  . Muscle weakness (generalized)    PT - End of Session Equipment Utilized During Treatment: Gait belt Activity Tolerance: Patient tolerated treatment well General Behavior During Session: [facility] for tasks performed Cognition: [facility] for tasks performed  RUSSELL,[name] 09/10/2010, 9:34 AM

## 2010-09-17 ENCOUNTER — Inpatient Hospital Stay (HOSPITAL_COMMUNITY): Admission: RE | Admit: 2010-09-17 | Payer: Medicare Other | Source: Ambulatory Visit | Admitting: Physical Therapy

## 2010-09-17 ENCOUNTER — Ambulatory Visit (HOSPITAL_COMMUNITY)
Admission: RE | Admit: 2010-09-17 | Discharge: 2010-09-17 | Disposition: A | Discharge status: Home / Self Care | Payer: Medicare Other | Source: Ambulatory Visit | Attending: Family Medicine | Admitting: Family Medicine

## 2010-09-17 DIAGNOSIS — IMO0001 Reserved for inherently not codable concepts without codable children: Secondary | ICD-10-CM | POA: Insufficient documentation

## 2010-09-17 DIAGNOSIS — M549 Dorsalgia, unspecified: Secondary | ICD-10-CM | POA: Insufficient documentation

## 2010-09-17 DIAGNOSIS — J449 Chronic obstructive pulmonary disease, unspecified: Secondary | ICD-10-CM | POA: Insufficient documentation

## 2010-09-17 DIAGNOSIS — I10 Essential (primary) hypertension: Secondary | ICD-10-CM | POA: Insufficient documentation

## 2010-09-17 DIAGNOSIS — M25559 Pain in unspecified hip: Secondary | ICD-10-CM | POA: Insufficient documentation

## 2010-09-17 DIAGNOSIS — M25569 Pain in unspecified knee: Secondary | ICD-10-CM | POA: Insufficient documentation

## 2010-09-17 DIAGNOSIS — R262 Difficulty in walking, not elsewhere classified: Secondary | ICD-10-CM | POA: Insufficient documentation

## 2010-09-17 DIAGNOSIS — J4489 Other specified chronic obstructive pulmonary disease: Secondary | ICD-10-CM | POA: Insufficient documentation

## 2010-09-17 DIAGNOSIS — M6281 Muscle weakness (generalized): Secondary | ICD-10-CM

## 2010-09-17 NOTE — Progress Notes (Signed)
Physical Therapy Treatment Patient Details  Name: Jennifer [name] MRN: [id number] Date of Birth: 01-Oct-1922  Today's Date: 09/17/2010 Time: 4098-1191 Time Calculation (min): 54 min Visit#: 6 of 12 Re-eval: 09/26/10  Pt. Returns to Dr. Romeo Apple 09/30/10 Charges:  Therex: 35'.  MHP supine 15'  Subjective: Symptoms/Limitations Symptoms: Pt with appt this morning did not show, showed this afternoon/got appt. confused.  Reports back, knees and R arm hurt due to arthritis. Pain Assessment Currently in Pain?: Yes Pain Score:   5 (states its a "5 or 10") Pain Location:  (all areas mentioned above; back knees and R UE)   Exercise/Treatments Standing:  Heel Raises: 15 reps  Toeraises 15X Hip abduction 10X 4#  Hip Extension 10X 4# Lateral Step Up: 5 reps  Functional Squat: 15 reps  Marching in place 10X Seated: (not done today secondary to time) Scapular Retraction: 10 reps  Long Arc Quad: 4# 10 reps  Hip ADduction: 15 reps;Both (isometric ball squeeze seated)  Sit to stand 5X  Modalities Modalities: Moist Heat Moist Heat Therapy Number Minutes Moist Heat: 15 Minutes Moist Heat Location:  (Bilateral knees and lower back)  Physical Therapy Assessment and Plan PT Assessment and Plan Clinical Impression Statement: Pt. activity level limited by pain, having to take a seated rest after each standing exercise.  Pt. able to complete all reps, including added weight from last visit.  Unable to SLS without UE support. PT Treatment/Interventions: Therapeutic exercise (MHP) PT Plan: Progress activity tolerance and decrease pain.    Problem List Patient Active Problem List  Diagnoses  . DERMATOMYCOSIS  . DYSLIPIDEMIA  . ANEMIA-IRON DEFICIENCY  . TOBACCO ABUSE  . CORONARY ARTERY DISEASE  . CEREBROVASCULAR DISEASE  . VARICOSE VEINS LOWER EXTREMITIES W/INFLAMMATION  . COPD  . GASTROESOPHAGEAL REFLUX DISEASE  . HIATAL HERNIA WITH REFLUX  . UNSPECIFIED HEPATITIS  . ACUTE CYSTITIS  .  CELLULITIS AND ABSCESS OF UNSPECIFIED DIGIT  . CELLULITIS AND ABSCESS OF UNSPECIFIED SITE  . OSTEOARTHRITIS  . Osteoarth NOS-L/Leg  . ARTHRITIS, LEFT ANKLE  . KNEE PAIN  . SPINAL STENOSIS  . BACK PAIN  . ANSERINE BURSITIS  . FATIGUE  . LEG EDEMA  . CHEST PAIN UNSPECIFIED  . BREAST CANCER, HX OF  . SHINGLES, HX OF  . TOTAL KNEE FOLLOW-UP  . Hypertension  . Carotid artery bruit  . Muscle weakness (generalized)    PT - End of Session Activity Tolerance: Patient limited by pain (pt. had to take seated rest break between each standing ex.) General Behavior During Session: [facility] for tasks performed Cognition: [facility] for tasks performed  Emeline Gins B 09/17/2010, 5:48 PM

## 2010-09-19 NOTE — Progress Notes (Signed)
  Subjective:    Patient ID: Jennifer [name], female    DOB: 08-Oct-1922, 77 y.o.   MRN: [id number]  HPI Pt is in with her daughter who lives out of state , for re-evaluation of chronic , uncontrolled arthritic pain, primarily involving back, also hips and knees.She is in physical therapy, and has had  no recent falls. She continues to smoke, states she will try to cut back, but has no quit date   Review of Systems See HPI Denies recent fever or chills. Denies sinus pressure, nasal congestion, ear pain or sore throat. Denies chest congestion, productive cough, has occasional  wheezing. Denies chest pains, palpitations and leg swelling Denies abdominal pain, nausea, vomiting,diarrhea or constipation.   Denies dysuria, frequency, hesitancy or incontinence.  Denies headaches, seizures, numbness, or tingling. Denies depression, anxiety or insomnia. Denies skin break down or rash.        Objective:   Physical Exam Patient alert and oriented and in no cardiopulmonary distress.  HEENT: No facial asymmetry, EOMI, no sinus tenderness,  oropharynx pink and moist.  Neck supple no adenopathy.  Chest: Clear to auscultation bilaterally.Decreased air entry throughout  CVS: S1, S2 no murmurs, no S3.  ABD: Soft non tender. Bowel sounds normal.  Ext: No edema  MS: decreased ROM spine, shoulders, hips and knees.  Skin: Intact, no ulcerations or rash noted.  Psych: Good eye contact, normal affect. Memory mildly impaired, not anxious or depressed appearing.  CNS: CN 2-12 intact, power, tone and sensation normal throughout.        Assessment & Plan:

## 2010-09-19 NOTE — Assessment & Plan Note (Signed)
Uncontrolled, lidoderm added

## 2010-09-19 NOTE — Assessment & Plan Note (Signed)
Controlled on no medication.   

## 2010-09-19 NOTE — Assessment & Plan Note (Signed)
Unchanged, counseled to quit 

## 2010-09-19 NOTE — Assessment & Plan Note (Signed)
Deteriorating, pt continues to smoke

## 2010-09-23 ENCOUNTER — Ambulatory Visit: Payer: Medicare Other | Admitting: Family Medicine

## 2010-09-24 ENCOUNTER — Ambulatory Visit (HOSPITAL_COMMUNITY)
Admission: RE | Admit: 2010-09-24 | Discharge: 2010-09-24 | Disposition: A | Discharge status: Home / Self Care | Payer: Medicare Other | Source: Ambulatory Visit | Attending: Physical Therapy | Admitting: Physical Therapy

## 2010-09-24 ENCOUNTER — Ambulatory Visit (HOSPITAL_COMMUNITY): Payer: Medicare Other | Admitting: Physical Therapy

## 2010-09-24 DIAGNOSIS — M6281 Muscle weakness (generalized): Secondary | ICD-10-CM

## 2010-09-24 NOTE — Patient Instructions (Addendum)
HEP

## 2010-09-24 NOTE — Progress Notes (Signed)
Physical Therapy Evaluation  Patient Details  Name: Jennifer [name] MRN: [id number] Date of Birth: September 28, 1922  Today's Date: 09/24/2010 Time: 0900-0957 Time Calculation (min): 57 min Visit#: 1 of 8 of this re assess time with a total of 7 visits. Re-eval: 10/24/10  Past Medical History:  Past Medical History  Diagnosis Date  . Chest pain 2006  . COPD (chronic obstructive pulmonary disease)   . Hypertension     ECG in 2006 mild to moderate left ventricular hypertrophy   . Hyperlipidemia   . Osteoarthritis     status post left TKA; surgery on the right is anticipated in the near future   . Nicotine addiction   . Gastroesophageal reflux disease   . Hiatal hernia   . Anemia     iron deficiency post op.  . Chronic back pain   . Pulmonary nodules     stable since 2006  . Cellulitis of finger     right   . Shingles     right breast   . Ulcer     gential  . Skin infection    Past Surgical History:  Past Surgical History  Procedure Date  . Cholecystectomy   . Combined hysterectomy abdominal w/ a&p repair / oophorectomy approx. 40 years ago   . Mastectomy 1998 & 2007     right -1998 / left 2007  . Total knee arthroplasty 4/08    left   . Right knee replacement 09/2009    Dr. Romeo Apple    Subjective Symptoms/Limitations Symptoms: Pt showed without an appointment today.  Pt to MD on 9/18.  PT states joints hurting due to arthritis. How long can you sit comfortably?: Pt states that she is able to sit for a long time at least an hour. How long can you stand comfortably?: Pt states that she has not been trying to stand. How long can you walk comfortably?: Pt states that she can walk for at least 15 minutes but she is still using her walker and would like to get back to her cane. Pain Assessment Currently in Pain?: No/denies  Precautions/Restrictions   falls  Prior Functioning   ambulate with a cane  Cognition  wnl  Sensation/Coordination/Flexibility     Assessment RLE Strength Right Hip Flexion: 3+/5 Right Hip Extension: 3-/5 Right Hip ABduction: 3+/5 Right Hip ADduction: 3+/5 Right Knee Flexion: 3/5 Right Knee Extension: 4/5 Right Ankle Dorsiflexion: 4/5 LLE Strength Left Hip Flexion: 3+/5 Left Hip Extension: 2+/5 Left Hip ABduction: 3+/5 Left Hip ADduction: 3+/5 Left Knee Flexion: 3/5 Left Knee Extension: 5/5 Left Ankle Dorsiflexion: 5/5 Lumbar AROM Lumbar Flexion:  (wnl) Lumbar Extension: 10 degree ext. Lumbar - Right Side Bend: decreased 10% Lumbar - Left Side Bend: decreaed 10% Lumbar - Right Rotation: decreased 30% Lumbar - Left Rotation: decreased 30%  Mobility (including Balance)   ambulating with a walker;  Unable to SLS for 1 second.    Exercise/Treatments For strengthening     SLS: 5x 1" each     Physical Therapy Assessment and Plan PT Assessment and Plan Clinical Impression Statement: Pt has improved in almost all mm strength.  Pt is able to be pushed to decreae rest time.  Pt will benefit from continued skilled therapy to work on strengthening and balance to reduce the risk of falling. Rehab Potential: Good PT Frequency: Min 2X/week PT Duration: 4 weeks PT Treatment/Interventions: DME instruction;Therapeutic exercise;Neuromuscular re-education PT Plan: request to increase treatment to 2 times a week for the next  four week to obtain goals of ambulation with cane and mm strength to 4/5    Goals PT Short Term Goals PT Short Term Goal 1 - Progress: Met PT Short Term Goal 2 - Progress: Met PT Short Term Goal 3: new goal as of 09/24/10-mm strength increased to at least 4/5. 2 wks PT Short Term Goal 4: New long term goal as of 9/12 to be met in 4 wks-Pt to be able to SLS for 4 seconds to improve balance to be able to ambulate with a cane. PT Long Term Goals PT Long Term Goal 1 - Progress: Progressing toward goal PT Long Term Goal 2 - Progress: Met Long Term Goal 3 Progress: Not met Long Term Goal 4  Progress: Met  Problem List Patient Active Problem List  Diagnoses  . DERMATOMYCOSIS  . DYSLIPIDEMIA  . ANEMIA-IRON DEFICIENCY  . TOBACCO ABUSE  . CORONARY ARTERY DISEASE  . CEREBROVASCULAR DISEASE  . VARICOSE VEINS LOWER EXTREMITIES W/INFLAMMATION  . COPD  . GASTROESOPHAGEAL REFLUX DISEASE  . HIATAL HERNIA WITH REFLUX  . UNSPECIFIED HEPATITIS  . ACUTE CYSTITIS  . CELLULITIS AND ABSCESS OF UNSPECIFIED DIGIT  . CELLULITIS AND ABSCESS OF UNSPECIFIED SITE  . OSTEOARTHRITIS  . Osteoarth NOS-L/Leg  . ARTHRITIS, LEFT ANKLE  . KNEE PAIN  . SPINAL STENOSIS  . BACK PAIN  . ANSERINE BURSITIS  . FATIGUE  . LEG EDEMA  . CHEST PAIN UNSPECIFIED  . BREAST CANCER, HX OF  . SHINGLES, HX OF  . TOTAL KNEE FOLLOW-UP  . Hypertension  . Carotid artery bruit  . Muscle weakness (generalized)    PT - End of Session Equipment Utilized During Treatment: Gait belt (cane) Activity Tolerance: Patient limited by fatigue General Behavior During Session: [facility] for tasks performed Cognition: [facility] for tasks performed   Meya Clutter,[name] 09/24/2010, 10:00 AM  Physician Documentation Your signature is required to indicate approval of the treatment plan as stated above.  Please sign and either send electronically or make a copy of this report for your files and return this physician signed original.   Please mark one 1.__approve of plan  2. ___approve of plan with the following conditions.   ______________________________                                                          _____________________ Physician Signature                                                                                                             Date

## 2010-09-26 ENCOUNTER — Ambulatory Visit (HOSPITAL_COMMUNITY)
Admission: RE | Admit: 2010-09-26 | Discharge: 2010-09-26 | Disposition: A | Discharge status: Home / Self Care | Payer: Medicare Other | Source: Ambulatory Visit | Attending: Family Medicine | Admitting: Family Medicine

## 2010-09-26 DIAGNOSIS — M6281 Muscle weakness (generalized): Secondary | ICD-10-CM

## 2010-09-26 NOTE — Progress Notes (Signed)
Physical Therapy Treatment Patient Details  Name: Jennifer [name] MRN: [id number] Date of Birth: [birth year]  Today's Date: 09/26/2010 Time: 0823-0912 Time Calculation (min): 49 min Visit#: 2 of 8 for re-evaluation with a total of 8 visits Re-eval: 10/24/10    Subjective: Symptoms/Limitations Symptoms: Pt states that she is walking about 50% in her house with her cane. Pain Assessment Currently in Pain?: No/denies  Precautions/Restrictions     Mobility (including Balance)       Exercise/Treatments Stretches Passive Hamstring Stretch: 1 rep;60 seconds (long sitting) Aerobic   Machines for Strengthening   Plyometrics   Standing Heel Raises: 15 reps Lateral Step Up: Both;15 reps Functional Squat: 15 reps Wall Squat: 15 reps Other Standing Knee Exercises:  (marchingx15) Seated Long Arc Quad: Strengthening;Both;15 reps;Weights Long Arc Quad Weight: 3 lbs. Other Seated Knee Exercises:  (sit to stand x 10) Supine Quad Sets: Both;10 reps Bridges: 10 reps Sidelying Hip ABduction: Both;15 reps Prone         Physical Therapy Assessment and Plan PT Assessment and Plan Clinical Impression Statement: Pt continues to improve in strength.  Needs manual cues for some exercises. PT Plan: Continue balance and strengthening    Goals    Problem List Patient Active Problem List  Diagnoses  . DERMATOMYCOSIS  . DYSLIPIDEMIA  . ANEMIA-IRON DEFICIENCY  . TOBACCO ABUSE  . CORONARY ARTERY DISEASE  . CEREBROVASCULAR DISEASE  . VARICOSE VEINS LOWER EXTREMITIES W/INFLAMMATION  . COPD  . GASTROESOPHAGEAL REFLUX DISEASE  . HIATAL HERNIA WITH REFLUX  . UNSPECIFIED HEPATITIS  . ACUTE CYSTITIS  . CELLULITIS AND ABSCESS OF UNSPECIFIED DIGIT  . CELLULITIS AND ABSCESS OF UNSPECIFIED SITE  . OSTEOARTHRITIS  . Osteoarth NOS-L/Leg  . ARTHRITIS, LEFT ANKLE  . KNEE PAIN  . SPINAL STENOSIS  . BACK PAIN  . ANSERINE BURSITIS  . FATIGUE  . LEG EDEMA  . CHEST PAIN UNSPECIFIED  .  BREAST CANCER, HX OF  . SHINGLES, HX OF  . TOTAL KNEE FOLLOW-UP  . Hypertension  . Carotid artery bruit  . Muscle weakness (generalized)    PT - End of Session Equipment Utilized During Treatment: Gait belt Activity Tolerance: Patient tolerated treatment well General Behavior During Session: [facility] for tasks performed Cognition: [facility] for tasks performed  RUSSELL,[name] 09/26/2010, 9:13 AM

## 2010-09-30 ENCOUNTER — Ambulatory Visit (INDEPENDENT_AMBULATORY_CARE_PROVIDER_SITE_OTHER): Payer: Medicare Other | Admitting: Orthopedic Surgery

## 2010-09-30 DIAGNOSIS — Z9889 Other specified postprocedural states: Secondary | ICD-10-CM

## 2010-09-30 NOTE — Patient Instructions (Signed)
Set up [facility] L-spine [facility] , [place] with different DR, not Dr Benard Rink)

## 2010-09-30 NOTE — Progress Notes (Signed)
Routine yearly visit for x-rays of the RIGHT total knee replacement.  The patient continues to complain of bilateral leg pain radiating to her feet.  She has a history of spinal stenosis, status post one epidural injection.  Review of systems primarily related to pain in her knees and legs with giving out, and collapsing of her lower extremities.  RIGHT knee. X-ray shows no palpitating features of the prosthetic device.  Recommend epidural injection at L5-S1 as previously done.  X-ray report 3 views of the RIGHT knee.  We x yearly annual followup of total knee replacement.  The prosthetic is aligned normally under no palpitating features and no loosening.  Impression normal knee x-ray.  Patient encouraged and finally agreed for a 2nd epidural injection to address her spinal stenosis

## 2010-10-01 ENCOUNTER — Other Ambulatory Visit: Payer: Self-pay | Admitting: Orthopedic Surgery

## 2010-10-01 ENCOUNTER — Ambulatory Visit (HOSPITAL_COMMUNITY)
Admission: RE | Admit: 2010-10-01 | Discharge: 2010-10-01 | Disposition: A | Discharge status: Home / Self Care | Payer: Medicare Other | Source: Ambulatory Visit | Attending: *Deleted | Admitting: *Deleted

## 2010-10-01 DIAGNOSIS — M4800 Spinal stenosis, site unspecified: Secondary | ICD-10-CM

## 2010-10-01 NOTE — Progress Notes (Addendum)
Physical Therapy Treatment Patient Details  Name: Jennifer [name] MRN: [id number] Date of Birth: August 12, 1922  Today's Date: 10/01/2010 Time: 4098-1191 Time Calculation (min): 61 min Visit#: 3  of 8   (total of 9 visits) Re-eval: 10/24/10 Charges: Therex x 40' MHP x 15'  Subjective: Symptoms/Limitations Symptoms: "It's hurting pretty bad today" Pain Assessment Pain Score:   8 Pain Location: Back Pain Orientation: Lower Pain Type: Chronic pain   Exercise/Treatments Stretches Passive Hamstring Stretch: 2 reps;60 seconds;Limitations Passive Hamstring Stretch Limitations: Bilateral Standing Heel Raises: 15 reps Lateral Step Up: 15 reps;Step Height: 4" Functional Squat: 15 reps Other Standing Knee Exercises: marching 2x5 Seated Long Arc Quad: 15 reps Long Arc Quad Weight: 3 lbs. Supine Bridges: 10 reps  MHP x 15' to lumbar seated  Physical Therapy Assessment and Plan PT Assessment and Plan Clinical Impression Statement: Pt continues to requires frequent rest breaks but displays increased tolerance for standing therex. Pt with increased knee pain after step ups. Pt with pain decrease to 5/10 at end of session. PT Treatment/Interventions: Therapeutic exercise PT Plan: Continue to progress strength, balance and activity tolerance per PT POC.     Problem List Patient Active Problem List  Diagnoses  . DERMATOMYCOSIS  . DYSLIPIDEMIA  . ANEMIA-IRON DEFICIENCY  . TOBACCO ABUSE  . CORONARY ARTERY DISEASE  . CEREBROVASCULAR DISEASE  . VARICOSE VEINS LOWER EXTREMITIES W/INFLAMMATION  . COPD  . GASTROESOPHAGEAL REFLUX DISEASE  . HIATAL HERNIA WITH REFLUX  . UNSPECIFIED HEPATITIS  . ACUTE CYSTITIS  . CELLULITIS AND ABSCESS OF UNSPECIFIED DIGIT  . CELLULITIS AND ABSCESS OF UNSPECIFIED SITE  . OSTEOARTHRITIS  . Osteoarth NOS-L/Leg  . ARTHRITIS, LEFT ANKLE  . KNEE PAIN  . SPINAL STENOSIS  . BACK PAIN  . ANSERINE BURSITIS  . FATIGUE  . LEG EDEMA  . CHEST PAIN UNSPECIFIED   . BREAST CANCER, HX OF  . SHINGLES, HX OF  . TOTAL KNEE FOLLOW-UP  . Hypertension  . Carotid artery bruit  . Muscle weakness (generalized)  . Status post knee surgery    PT - End of Session Activity Tolerance: Patient tolerated treatment well General Behavior During Session: [facility] for tasks performed Cognition: [facility] for tasks performed  Antonieta Iba 10/01/2010, 10:37 AM

## 2010-10-02 ENCOUNTER — Ambulatory Visit (HOSPITAL_COMMUNITY)
Admission: RE | Admit: 2010-10-02 | Discharge: 2010-10-02 | Disposition: A | Discharge status: Home / Self Care | Payer: Medicare Other | Source: Ambulatory Visit | Attending: Family Medicine | Admitting: Family Medicine

## 2010-10-02 NOTE — Progress Notes (Signed)
Physical Therapy Treatment Patient Details  Name: Jennifer [name] MRN: [id number] Date of Birth: 07/13/1922  Today's Date: 10/02/2010 Time: 4098-1191 Time Calculation (min): 44 min Visit#: 4  of 8   Re-eval: 10/24/10 Charges: Therex x 28' MHP x 12'  Subjective: Symptoms/Limitations Symptoms: My pain is about an 8 Pain Assessment Currently in Pain?: Yes Pain Score:   8 Pain Location: Back Pain Orientation: Lower   Exercise/Treatments Standing  Heel Raises: 15 reps  Lateral Step Up: 15 reps;Step Height: 4"  Functional Squat: 15 reps Bilateral Other Standing Knee Exercises: marching 10x Seated  Long Arc Quad: 15 reps  Long Arc Quad Weight: 3 lbs. Sit to stand x 10  Modalities Modalities: Moist Heat Moist Heat Therapy Number Minutes Moist Heat: 12 Minutes Moist Heat Location: Other (comment) (lumbar (seated))  Physical Therapy Assessment and Plan PT Assessment and Plan Clinical Impression Statement: Pt coninute to display improvements in strength and activity tolerance. Pt requires multimodal cueing to avoid posterior pelvic tilt with squats.Pt with decreased pain after MHP. PT Treatment/Interventions: Therapeutic exercise;Other (comment) (MHP) PT Plan: Continue to progress per PT POC.     Problem List Patient Active Problem List  Diagnoses  . DERMATOMYCOSIS  . DYSLIPIDEMIA  . ANEMIA-IRON DEFICIENCY  . TOBACCO ABUSE  . CORONARY ARTERY DISEASE  . CEREBROVASCULAR DISEASE  . VARICOSE VEINS LOWER EXTREMITIES W/INFLAMMATION  . COPD  . GASTROESOPHAGEAL REFLUX DISEASE  . HIATAL HERNIA WITH REFLUX  . UNSPECIFIED HEPATITIS  . ACUTE CYSTITIS  . CELLULITIS AND ABSCESS OF UNSPECIFIED DIGIT  . CELLULITIS AND ABSCESS OF UNSPECIFIED SITE  . OSTEOARTHRITIS  . Osteoarth NOS-L/Leg  . ARTHRITIS, LEFT ANKLE  . KNEE PAIN  . SPINAL STENOSIS  . BACK PAIN  . ANSERINE BURSITIS  . FATIGUE  . LEG EDEMA  . CHEST PAIN UNSPECIFIED  . BREAST CANCER, HX OF  . SHINGLES, HX OF  .  TOTAL KNEE FOLLOW-UP  . Hypertension  . Carotid artery bruit  . Muscle weakness (generalized)  . Status post knee surgery    PT - End of Session Activity Tolerance: Patient tolerated treatment well General Behavior During Session: [facility] for tasks performed Cognition: [name] for tasks performed  Antonieta Iba 10/02/2010, 11:34 AM

## 2010-10-06 ENCOUNTER — Telehealth: Payer: Self-pay | Admitting: Radiology

## 2010-10-06 ENCOUNTER — Other Ambulatory Visit: Payer: Self-pay | Admitting: Orthopedic Surgery

## 2010-10-06 DIAGNOSIS — M4800 Spinal stenosis, site unspecified: Secondary | ICD-10-CM

## 2010-10-06 LAB — BLOOD GAS, ARTERIAL
Bicarbonate: 25.9 — ABNORMAL HIGH
FIO2: 0.21
FIO2: 0.21
O2 Saturation: 94.5
O2 Saturation: 96.2
Patient temperature: 37
pCO2 arterial: 37.7
pH, Arterial: 7.41 — ABNORMAL HIGH
pH, Arterial: 7.451 — ABNORMAL HIGH
pO2, Arterial: 75.7 — ABNORMAL LOW

## 2010-10-06 NOTE — Telephone Encounter (Signed)
I faxed a referral for the patient to [facility] to have another ESI injection in her back at level L5-S1.

## 2010-10-07 ENCOUNTER — Ambulatory Visit (HOSPITAL_COMMUNITY)
Admission: RE | Admit: 2010-10-07 | Discharge: 2010-10-07 | Disposition: A | Discharge status: Home / Self Care | Payer: Medicare Other | Source: Ambulatory Visit | Attending: Family Medicine | Admitting: Family Medicine

## 2010-10-07 ENCOUNTER — Telehealth: Payer: Self-pay | Admitting: Family Medicine

## 2010-10-07 NOTE — Telephone Encounter (Signed)
pls call back and document a correct message thank you!

## 2010-10-07 NOTE — Progress Notes (Signed)
Physical Therapy Treatment Patient Details  Name: CORNELLA EMMER MRN: [id number] Date of Birth: Mar 25, 1922  Today's Date: 10/07/2010 Time: 4098-1191 Time Calculation (min): 41 min Visit#: 11  of 14   Re-eval: 10/17/10 Charges: therex  25', MHP 15 seated    Subjective: Symptoms/Limitations Symptoms: Pt. to get another spinal injection according to 9/24 communication from Dr. Mort Sawyers office.  Pt. reports her knees and back hurt today 9/10 pain. Pain Assessment Currently in Pain?: Yes Pain Score:   9 Pain Location: Back   Exercise/Treatments Standing Heel Raises: 15 reps Lateral Step Up: 15 reps;Step Height: 4";Both Functional Squat: 15 reps Other Standing Knee Exercises: marching 15X Seated Long Arc Quad: 15 reps Long Arc Quad Weight: 3 lbs. Other Seated Knee Exercises: Hip add squeeze 10X5" Other Seated Knee Exercises: sit to stand    Modalities Modalities: Moist Heat Moist Heat Therapy Number Minutes Moist Heat: 15 Minutes Moist Heat Location: Other (comment) (lumbar and Bilateral knees)  Physical Therapy Assessment and Plan PT Assessment and Plan Clinical Impression Statement: Pt. continues to be limited by fatique and pain.  Pt. has 3 visits remaining per last re-eval. PT Plan: Continue 3 more visits then re-eval.    Problem List Patient Active Problem List  Diagnoses  . DERMATOMYCOSIS  . DYSLIPIDEMIA  . ANEMIA-IRON DEFICIENCY  . TOBACCO ABUSE  . CORONARY ARTERY DISEASE  . CEREBROVASCULAR DISEASE  . VARICOSE VEINS LOWER EXTREMITIES W/INFLAMMATION  . COPD  . GASTROESOPHAGEAL REFLUX DISEASE  . HIATAL HERNIA WITH REFLUX  . UNSPECIFIED HEPATITIS  . ACUTE CYSTITIS  . CELLULITIS AND ABSCESS OF UNSPECIFIED DIGIT  . CELLULITIS AND ABSCESS OF UNSPECIFIED SITE  . OSTEOARTHRITIS  . Osteoarth NOS-L/Leg  . ARTHRITIS, LEFT ANKLE  . KNEE PAIN  . SPINAL STENOSIS  . BACK PAIN  . ANSERINE BURSITIS  . FATIGUE  . LEG EDEMA  . CHEST PAIN UNSPECIFIED  . BREAST  CANCER, HX OF  . SHINGLES, HX OF  . TOTAL KNEE FOLLOW-UP  . Hypertension  . Carotid artery bruit  . Muscle weakness (generalized)  . Status post knee surgery    PT - End of Session Activity Tolerance: Patient limited by fatigue (Pt. takes one seated rest break between each exercise.) General Behavior During Session: [facility] for tasks performed Cognition: [facility] for tasks performed  Bascom Levels, Amy B 10/07/2010, 10:15 AM

## 2010-10-07 NOTE — Telephone Encounter (Signed)
Pt can get toradol 60mg  iM only, pls let her know, and arrange

## 2010-10-08 NOTE — Telephone Encounter (Signed)
CALLED PATIENT, NO ANSWER °

## 2010-10-09 ENCOUNTER — Ambulatory Visit (INDEPENDENT_AMBULATORY_CARE_PROVIDER_SITE_OTHER): Payer: Medicare Other

## 2010-10-09 ENCOUNTER — Ambulatory Visit (HOSPITAL_COMMUNITY)
Admission: RE | Admit: 2010-10-09 | Discharge: 2010-10-09 | Disposition: A | Discharge status: Home / Self Care | Payer: Medicare Other | Source: Ambulatory Visit | Attending: Family Medicine | Admitting: Family Medicine

## 2010-10-09 VITALS — BP 160/90 | Ht 64.0 in | Wt 190.1 lb

## 2010-10-09 DIAGNOSIS — Z23 Encounter for immunization: Secondary | ICD-10-CM

## 2010-10-09 DIAGNOSIS — M25519 Pain in unspecified shoulder: Secondary | ICD-10-CM

## 2010-10-09 DIAGNOSIS — M25511 Pain in right shoulder: Secondary | ICD-10-CM

## 2010-10-09 MED ORDER — KETOROLAC TROMETHAMINE 30 MG/ML IJ SOLN
60.0000 mg | Freq: Once | INTRAMUSCULAR | Status: AC
  Administered 2010-10-09: 60 mg via INTRAMUSCULAR

## 2010-10-09 NOTE — Telephone Encounter (Signed)
Coming in around 10 am today

## 2010-10-09 NOTE — Progress Notes (Signed)
Physical Therapy Treatment Patient Details  Name: Jennifer [name] MRN: [id number] Date of Birth: 1922-09-11  Today's Date: 10/09/2010 Time: 5621-3086 Time Calculation (min): 55 min Visit#: 12  of 14   Re-eval: 10/16/10 Charges:  therex 26', MHP 15' seated    Subjective: Symptoms/Limitations Symptoms: Pt. 25' late for appt. States her pain is unchanged but did have a little relief for a couple hours after last visit.  Going to see Dr. Lodema Hong when she leaves therapy for her flu shot and hopefully some pain medicine Pain Assessment Currently in Pain?: Yes Pain Score:   9 Pain Location: Back Pain Orientation: Lower  Exercise/Treatments Standing Heel Raises: 20 reps Lateral Step Up: 15 reps;Step Height: 4" Functional Squat: 15 reps Other Standing Knee Exercises: marching 15X Seated Long Arc Quad: 2 sets;15 reps Long Arc Quad Weight: 3 lbs. Other Seated Knee Exercises: Hip add squeeze 10X5" Other Seated Knee Exercises: sit to stand 5X   Modalities Modalities: Moist Heat Moist Heat Therapy Number Minutes Moist Heat: 15 Minutes Moist Heat Location:  (lumbar, bilateral knees seated)  Physical Therapy Assessment and Plan PT Assessment and Plan Clinical Impression Statement: Pt. progress continues to be limited by pain/fatigue.  Pt. requires short seated rest between each exercise. Clinical Impairments Affecting Rehab Potential: B knee pain and LBP, fatigue. PT Treatment/Interventions: Therapeutic exercise (MHP) PT Plan: Continue X 2 more visits then re-eval.    Problem List Patient Active Problem List  Diagnoses  . DERMATOMYCOSIS  . DYSLIPIDEMIA  . ANEMIA-IRON DEFICIENCY  . TOBACCO ABUSE  . CORONARY ARTERY DISEASE  . CEREBROVASCULAR DISEASE  . VARICOSE VEINS LOWER EXTREMITIES W/INFLAMMATION  . COPD  . GASTROESOPHAGEAL REFLUX DISEASE  . HIATAL HERNIA WITH REFLUX  . UNSPECIFIED HEPATITIS  . ACUTE CYSTITIS  . CELLULITIS AND ABSCESS OF UNSPECIFIED DIGIT  . CELLULITIS  AND ABSCESS OF UNSPECIFIED SITE  . OSTEOARTHRITIS  . Osteoarth NOS-L/Leg  . ARTHRITIS, LEFT ANKLE  . KNEE PAIN  . SPINAL STENOSIS  . BACK PAIN  . ANSERINE BURSITIS  . FATIGUE  . LEG EDEMA  . CHEST PAIN UNSPECIFIED  . BREAST CANCER, HX OF  . SHINGLES, HX OF  . TOTAL KNEE FOLLOW-UP  . Hypertension  . Carotid artery bruit  . Muscle weakness (generalized)  . Status post knee surgery    PT - End of Session Activity Tolerance: Patient tolerated treatment well General Behavior During Session: [facility] for tasks performed Cognition: [facility] for tasks performed  Emeline Gins B 10/09/2010, 10:27 AM

## 2010-10-14 ENCOUNTER — Telehealth: Payer: Self-pay | Admitting: Orthopedic Surgery

## 2010-10-14 ENCOUNTER — Ambulatory Visit (HOSPITAL_COMMUNITY)
Admission: RE | Admit: 2010-10-14 | Discharge: 2010-10-14 | Disposition: A | Discharge status: Home / Self Care | Payer: Medicare Other | Source: Ambulatory Visit | Attending: Family Medicine | Admitting: Family Medicine

## 2010-10-14 DIAGNOSIS — M25569 Pain in unspecified knee: Secondary | ICD-10-CM | POA: Insufficient documentation

## 2010-10-14 DIAGNOSIS — M6281 Muscle weakness (generalized): Secondary | ICD-10-CM

## 2010-10-14 DIAGNOSIS — M549 Dorsalgia, unspecified: Secondary | ICD-10-CM | POA: Insufficient documentation

## 2010-10-14 DIAGNOSIS — J4489 Other specified chronic obstructive pulmonary disease: Secondary | ICD-10-CM | POA: Insufficient documentation

## 2010-10-14 DIAGNOSIS — IMO0001 Reserved for inherently not codable concepts without codable children: Secondary | ICD-10-CM | POA: Insufficient documentation

## 2010-10-14 DIAGNOSIS — M25559 Pain in unspecified hip: Secondary | ICD-10-CM | POA: Insufficient documentation

## 2010-10-14 DIAGNOSIS — J449 Chronic obstructive pulmonary disease, unspecified: Secondary | ICD-10-CM | POA: Insufficient documentation

## 2010-10-14 DIAGNOSIS — I10 Essential (primary) hypertension: Secondary | ICD-10-CM | POA: Insufficient documentation

## 2010-10-14 DIAGNOSIS — R262 Difficulty in walking, not elsewhere classified: Secondary | ICD-10-CM | POA: Insufficient documentation

## 2010-10-14 NOTE — Patient Instructions (Signed)
Pt to work on standing tall when walking with her walker

## 2010-10-14 NOTE — Telephone Encounter (Signed)
Danielle/[facility] called to  check phone numbers for [facility].  She has the correct numbers but has not been able to reach her to schedule the injections.  I also tried but did not get an answer.

## 2010-10-14 NOTE — Progress Notes (Signed)
CPhysical Therapy Treatment Patient Details  Name: Jennifer [name] MRN: [id number] Date of Birth: 11-28-1922  Today's Date: 10/14/2010 Time: 4098-1191 Time Calculation (min): 59 min Visit#: 13  of 14   Re-eval: 10/16/10  Charges:  Therex 52;  HMP20  Subjective: Symptoms/Limitations Symptoms: Pt states that she is trying to do her exercises at home.  Pt states her knees hurt as bad as her back.  Pain Assessment Currently in Pain?: Yes Pain Score:   8 Pain Location: Knee Pain Type: Chronic pain Pain Onset: More than a month ago Pain Frequency: Intermittent Pain Relieving Factors: heat sitting down  Precautions/Restrictions   falls  Mobility (including Balance)   decreased balance static and dynamic    Exercise/Treatments Stretches Passive Hamstring Stretch: 2 reps;60 seconds Aerobic  bike x 6' @ 2.0    Standing  Lateral step up x 10 B; functional squat x 12; heel raises x 12  Other Standing Knee Exercises: marching 15X Other Standing Knee Exercises:  (Gt train with cane around department x 4 min.) Seated  scapular retraction x 10 Supine  HMP placed on low back while supine to decrease low back pain x 20 min. Quad Sets: Both;10 reps Bridges: 10 reps Other Supine Knee Exercises:  (ab set/glut set/adductor sets x 10) Other Supine Knee Exercises:  (LTR x 10) Sidelying Hip ABduction: 15 reps        Physical Therapy Assessment and Plan PT Assessment and Plan Clinical Impression Statement: Pt continues to need multiple rest breaks today.  Pt becomes easily short of breath. Clinical Impairments Affecting Rehab Potential: decreased ROM in hips and knees, pain, SOB  PT Plan: Re-evaluate next treatment.    Goals  increase balance and strength to decrease the risk of falling.  Problem List Patient Active Problem List  Diagnoses  . DERMATOMYCOSIS  . DYSLIPIDEMIA  . ANEMIA-IRON DEFICIENCY  . TOBACCO ABUSE  . CORONARY ARTERY DISEASE  . CEREBROVASCULAR DISEASE  .  VARICOSE VEINS LOWER EXTREMITIES W/INFLAMMATION  . COPD  . GASTROESOPHAGEAL REFLUX DISEASE  . HIATAL HERNIA WITH REFLUX  . UNSPECIFIED HEPATITIS  . ACUTE CYSTITIS  . CELLULITIS AND ABSCESS OF UNSPECIFIED DIGIT  . CELLULITIS AND ABSCESS OF UNSPECIFIED SITE  . OSTEOARTHRITIS  . Osteoarth NOS-L/Leg  . ARTHRITIS, LEFT ANKLE  . KNEE PAIN  . SPINAL STENOSIS  . BACK PAIN  . ANSERINE BURSITIS  . FATIGUE  . LEG EDEMA  . CHEST PAIN UNSPECIFIED  . BREAST CANCER, HX OF  . SHINGLES, HX OF  . TOTAL KNEE FOLLOW-UP  . Hypertension  . Carotid artery bruit  . Muscle weakness (generalized)  . Status post knee surgery    PT - End of Session Equipment Utilized During Treatment: Gait belt;Other (comment) (straight cane) Activity Tolerance: Patient tolerated treatment well General Behavior During Session: [facility] for tasks performed Cognition: [facility] for tasks performed  RUSSELL,[name] 10/14/2010, 10:04 AM

## 2010-10-16 ENCOUNTER — Ambulatory Visit (HOSPITAL_COMMUNITY)
Admission: RE | Admit: 2010-10-16 | Discharge: 2010-10-16 | Disposition: A | Discharge status: Home / Self Care | Payer: Medicare Other | Source: Ambulatory Visit | Attending: Family Medicine | Admitting: Family Medicine

## 2010-10-16 DIAGNOSIS — M6281 Muscle weakness (generalized): Secondary | ICD-10-CM

## 2010-10-16 NOTE — Patient Instructions (Signed)
Given new HEP

## 2010-10-16 NOTE — Progress Notes (Signed)
Physical Therapy Evaluation  Patient Details  Name: Jennifer [name] MRN: [id number] Date of Birth: [birth year]  Today's Date: 10/16/2010 Time: 1308-6578 Time Calculation (min): 36 min Visit#: 14  of 14   Re-eval:      Past Medical History:  Past Medical History  Diagnosis Date  . Chest pain 2006  . COPD (chronic obstructive pulmonary disease)   . Hypertension     ECG in 2006 mild to moderate left ventricular hypertrophy   . Hyperlipidemia   . Osteoarthritis     status post left TKA; surgery on the right is anticipated in the near future   . Nicotine addiction   . Gastroesophageal reflux disease   . Hiatal hernia   . Anemia     iron deficiency post op.  . Chronic back pain   . Pulmonary nodules     stable since 2006  . Cellulitis of finger     right   . Shingles     right breast   . Ulcer     gential  . Skin infection    Past Surgical History:  Past Surgical History  Procedure Date  . Cholecystectomy   . Combined hysterectomy abdominal w/ a&p repair / oophorectomy approx. 40 years ago   . Mastectomy 1998 & 2007     right -1998 / left 2007  . Total knee arthroplasty 4/08    left   . Right knee replacement 09/2009    Dr. Romeo Apple    Subjective Symptoms/Limitations Symptoms: I'm still doing my exercises at home.  I think therapy is helping.  No falls How long can you sit comfortably?: No problem sitting  on re-eval on 9/12 sitting was no problem. How long can you stand comfortably?: Pt states that she is able to stand to wash dishes and do laundry at re-eval on 9-21 pt had not really tried to stand. How long can you walk comfortably?: Pt continues to use her walker at home and outside states that she feel more comfortable with it.  Able to walk for 15 minutes. Pain Assessment Currently in Pain?: Yes Pain Score: 0-No pain (Pt states knee pain comes and goes now was constant.) Pain Location: Knee Pain Type: Chronic pain Pain Relieving Factors: heat and sitting  down. Effect of Pain on Daily Activities: increases Multiple Pain Sites: Yes  Precautions/Restrictions   falls  Prior Functioning   ambulating with cane  Assessment RLE AROM (degrees) Right Knee Extension 0-130: 15  Right Knee Flexion 0-140: 123  RLE Strength Right Hip Flexion: 3+/5 was 3+ Right Hip Extension: 3/5 was 3- Right Hip ABduction: 4/5 was 3+ Right Hip ADduction: 4/5 was 3+ Right Knee Flexion: 5/5 was 4/5 Right Knee Extension: 5/5 Right Ankle Dorsiflexion: 5/5 was 4/5 LLE AROM (degrees) Left Knee Extension 0-130: 15  Left Knee Flexion 0-140: 115  LLE Strength Left Hip Flexion: 3+/5 was 3+ Left Hip Extension: 3/5 was 3-  Left Hip ABduction: 4/5 was 3+ Left Hip ADduction: 4/5 was 3+ Left Knee Flexion: 5/5 Left Knee Extension: 5/5 Left Ankle Dorsiflexion: 5/5 Lumbar AROM Lumbar Flexion: wnl Lumbar Extension: 15 degrees was 10 at last re-eval Lumbar - Right Side Bend: wnl was decreased 10//5 Lumbar - Left Side Bend: wfl was decreased 10% Lumbar - Right Rotation: decreased 20% was 30% Lumbar - Left Rotation: decreased 10% was 20%  Exercise/Treatments   Supine Quad Sets: Both;10 reps Heel Slides: 5 sets;Both Bridges: 10 reps Sidelying Hip ABduction: 10 reps;Both Prone  Physical Therapy Assessment and Plan PT Assessment and Plan Clinical Impression Statement: Pt has progressed well with therapy.  She still has strength and balance deficits but feels she can continue to exercise with a home exercise program. PT Plan: discharge patient to HEP    Goals Home Exercise Program PT Goal: Perform Home Exercise Program - Progress: Met PT Short Term Goals PT Short Term Goal 1 - Progress: Met PT Short Term Goal 2 - Progress: Met PT Short Term Goal 3 - Progress: Met PT Short Term Goal 4: able to SLS for 2 seconds. PT Short Term Goal 4 - Progress: Progressing toward goal PT Long Term Goals PT Long Term Goal 1 - Progress: Met PT Long Term Goal 2 -  Progress: Met Long Term Goal 3: Pt can ambulate with cane in department safely but does not feel safe at home. Long Term Goal 3 Progress: Not met Long Term Goal 4 Progress: Met  Problem List Patient Active Problem List  Diagnoses  . DERMATOMYCOSIS  . DYSLIPIDEMIA  . ANEMIA-IRON DEFICIENCY  . TOBACCO ABUSE  . CORONARY ARTERY DISEASE  . CEREBROVASCULAR DISEASE  . VARICOSE VEINS LOWER EXTREMITIES W/INFLAMMATION  . COPD  . GASTROESOPHAGEAL REFLUX DISEASE  . HIATAL HERNIA WITH REFLUX  . UNSPECIFIED HEPATITIS  . ACUTE CYSTITIS  . CELLULITIS AND ABSCESS OF UNSPECIFIED DIGIT  . CELLULITIS AND ABSCESS OF UNSPECIFIED SITE  . OSTEOARTHRITIS  . Osteoarth NOS-L/Leg  . ARTHRITIS, LEFT ANKLE  . KNEE PAIN  . SPINAL STENOSIS  . BACK PAIN  . ANSERINE BURSITIS  . FATIGUE  . LEG EDEMA  . CHEST PAIN UNSPECIFIED  . BREAST CANCER, HX OF  . SHINGLES, HX OF  . TOTAL KNEE FOLLOW-UP  . Hypertension  . Carotid artery bruit  . Muscle weakness (generalized)  . Status post knee surgery        Jennifer [name],Jennifer [name] 10/16/2010, 9:25 AM  Physician Documentation Your signature is required to indicate approval of the treatment plan as stated above.  Please sign and either send electronically or make a copy of this report for your files and return this physician signed original.   Please mark one 1.__approve of plan  2. ___approve of plan with the following conditions.   ______________________________                                                          _____________________ Physician Signature                                                                                                             Date

## 2010-10-20 LAB — POCT CARDIAC MARKERS
Myoglobin, poc: 103
Operator id: 213931

## 2010-10-20 LAB — POCT I-STAT CREATININE
Creatinine, Ser: 0.9
Operator id: 213931

## 2010-10-20 LAB — DIFFERENTIAL
Basophils Absolute: 0
Basophils Relative: 1
Eosinophils Absolute: 0.1 — ABNORMAL LOW
Monocytes Absolute: 0.4
Monocytes Relative: 9
Neutro Abs: 1.7
Neutrophils Relative %: 42 — ABNORMAL LOW

## 2010-10-20 LAB — I-STAT 8, (EC8 V) (CONVERTED LAB)
Acid-Base Excess: 6 — ABNORMAL HIGH
Chloride: 104
Hemoglobin: 16 — ABNORMAL HIGH
Potassium: 3.1 — ABNORMAL LOW
Sodium: 139
TCO2: 31

## 2010-10-20 LAB — CBC
MCHC: 32.7
MCV: 92.4
RBC: 4.76
RDW: 14.7

## 2010-10-27 ENCOUNTER — Encounter: Payer: Self-pay | Admitting: Family Medicine

## 2010-10-30 ENCOUNTER — Ambulatory Visit (INDEPENDENT_AMBULATORY_CARE_PROVIDER_SITE_OTHER): Payer: Medicare Other | Admitting: Family Medicine

## 2010-10-30 ENCOUNTER — Encounter: Payer: Self-pay | Admitting: Family Medicine

## 2010-10-30 VITALS — BP 120/80 | HR 85 | Resp 16 | Ht 64.0 in | Wt 189.4 lb

## 2010-10-30 DIAGNOSIS — J449 Chronic obstructive pulmonary disease, unspecified: Secondary | ICD-10-CM

## 2010-10-30 DIAGNOSIS — F17200 Nicotine dependence, unspecified, uncomplicated: Secondary | ICD-10-CM

## 2010-10-30 DIAGNOSIS — M1990 Unspecified osteoarthritis, unspecified site: Secondary | ICD-10-CM

## 2010-10-30 MED ORDER — KETOROLAC TROMETHAMINE 30 MG/ML IJ SOLN
30.0000 mg | Freq: Once | INTRAMUSCULAR | Status: AC
  Administered 2010-10-30: 30 mg via INTRAMUSCULAR

## 2010-10-30 MED ORDER — IBUPROFEN 800 MG PO TABS: ORAL_TABLET | ORAL | Status: DC

## 2010-10-30 NOTE — Patient Instructions (Signed)
F/U mid January.  LABS in August were excellent.   Please think about quitting smoking.  This is very important for your health.  Consider setting a quit date, then cutting back or switching brands to prepare to stop.  Also think of the money you will save every day by not smoking.  Quick Tips to Quit Smoking: Fix a date i.e. keep a date in mind from when you would not touch a tobacco product to smoke  Keep yourself busy and block your mind with work loads or reading books or watching movies in malls where smoking is not allowed  Vanish off the things which reminds you about smoking for example match box, or your favorite lighter, or the pipe you used for smoking, or your favorite jeans and shirt with which you used to enjoy smoking, or the club where you used to do smoking  Try to avoid certain people places and incidences where and with whom smoking is a common factor to add on  Praise yourself with some token gifts from the money you saved by stopping smoking  Anti Smoking teams are there to help you. Join their programs  Anti-smoking Gums are there in many medical shops. Try them to quit smoking   Side-effects of Smoking: Disease caused by smoking cigarettes are emphysema, bronchitis, heart failures  Premature death  Cancer is the major side effect of smoking  Heart attacks and strokes are the quick effects of smoking causing sudden death  Some smokers lives end up with limbs amputated  Breathing problem or fast breathing is another side effect of smoking  Due to more intakes of smokes, carbon mono-oxide goes into your brain and other muscles of the body which leads to swelling of the veins and blockage to the air passage to lungs  Carbon monoxide blocks blood vessels which leads to blockage in the flow of blood to different major body organs like heart lungs and thus leads to attacks and deaths  During pregnancy smoking is very harmful and leads to premature birth of the infant,  spontaneous abortions, low weight of the infant during birth  Fat depositions to narrow and blocked blood vessels causing heart attacks  In many cases cigarette smoking caused infertility in men    Toradol will be administered in the office for your arthritis pain today

## 2010-10-30 NOTE — Assessment & Plan Note (Signed)
Deteriorated, now c/o shoulder pain right, toradol 30mg  im administered in office

## 2010-10-30 NOTE — Assessment & Plan Note (Signed)
Smokes 10/day unwilling to set quit date

## 2010-10-31 ENCOUNTER — Telehealth: Payer: Self-pay | Admitting: Family Medicine

## 2010-10-31 NOTE — Progress Notes (Signed)
  Subjective:    Patient ID: Jennifer [name], female    DOB: 04-02-1922, 77 y.o.   MRN: [id number]  HPI The PT is here for follow up and re-evaluation of chronic medical conditions, medication management and review of any available recent lab and radiology data.  Preventive health is updated, specifically  Immunization.   Questions or concerns regarding consultations or procedures which the PT has had in the interim are  addressed. The PT denies any adverse reactions to current medications since the last visit.  C/o new right shoulder pain, limiting ability to raise arm Still smoking on average 10 ciggs per day, no quit date set    ]   Review of Systems See HPI Denies recent fever or chills. Denies sinus pressure, nasal congestion, ear pain or sore throat. Denies chest congestion, productive cough or wheezing. Denies chest pains, palpitations and leg swelling Denies abdominal pain, nausea, vomiting,diarrhea or constipation.   Denies dysuria, frequency, hesitancy or incontinence.  Denies headaches, seizures, numbness, or tingling. Denies depression, anxiety or insomnia. Denies skin break down or rash.        Objective:   Physical Exam  Patient alert and oriented and in no cardiopulmonary distress.  HEENT: No facial asymmetry, EOMI, no sinus tenderness,  oropharynx pink and moist.  Neck decreased ROM no adenopathy.  Chest: Clear to auscultation bilaterally.decreased air entry throughout  CVS: S1, S2 no murmurs, no S3.  ABD: Soft non tender. Bowel sounds normal.  Ext: No edema  WU:[id number]  ROM spine, shoulders, hips and knees.  Skin: Intact, no ulcerations or rash noted.  Psych: Good eye contact, normal affect. Memory impaired not anxious or depressed appearing.  CNS: CN 2-12 intact, power, tone and sensation normal throughout.       Assessment & Plan:

## 2010-10-31 NOTE — Assessment & Plan Note (Signed)
Deteriorated, continues to smoke, counseled to quit

## 2010-10-31 NOTE — Telephone Encounter (Signed)
Is there anything she can take other than OTC for her arthritis pain?

## 2010-11-02 NOTE — Telephone Encounter (Signed)
Remind her of the prescription lidoderm patch she has to be applied every 12 hours, I discussed this with ehr daughter when she was here at the previous visit also

## 2010-11-07 NOTE — Telephone Encounter (Signed)
Patient aware.

## 2010-12-15 ENCOUNTER — Telehealth: Payer: Self-pay | Admitting: Family Medicine

## 2010-12-15 NOTE — Telephone Encounter (Signed)
Returned pt. Call. No answer and no answering machine

## 2010-12-16 ENCOUNTER — Telehealth: Payer: Self-pay | Admitting: Orthopedic Surgery

## 2010-12-16 NOTE — Telephone Encounter (Signed)
Called, but no answer 12/16/10

## 2010-12-16 NOTE — Telephone Encounter (Signed)
Jennifer [name] is requesting an appointment here for her back/knee pain.  She only got 1 ESI and said she is not going to get another one because her back has hurt since the first one. She says she is taking Ibuprofen 650 mg twice a day and still hurting. Should you prescribe anything else, she used [facility] in [place].  Please advise if ok to schedule here.

## 2010-12-16 NOTE — Telephone Encounter (Signed)
Please tell her i do not have another solution for her other than the injection but I can get a spine specialist to look at her

## 2010-12-17 NOTE — Telephone Encounter (Signed)
Spoke with pt. She stated that meds are not helping her back or knees and she refuses to go to Dr. Romeo Apple for a followup. Call transferred to front desk to check on appointment availability.

## 2010-12-17 NOTE — Telephone Encounter (Signed)
Called Patient this morning and relayed Dr. Mort Sawyers message, she said she will give it some thought and let us know her decision

## 2010-12-18 ENCOUNTER — Encounter: Payer: Self-pay | Admitting: Family Medicine

## 2010-12-23 ENCOUNTER — Encounter: Payer: Self-pay | Admitting: Family Medicine

## 2010-12-23 ENCOUNTER — Ambulatory Visit (INDEPENDENT_AMBULATORY_CARE_PROVIDER_SITE_OTHER): Payer: Medicare Other | Admitting: Family Medicine

## 2010-12-23 VITALS — BP 132/70 | HR 80 | Resp 19 | Ht 64.0 in | Wt 184.0 lb

## 2010-12-23 DIAGNOSIS — F17200 Nicotine dependence, unspecified, uncomplicated: Secondary | ICD-10-CM

## 2010-12-23 DIAGNOSIS — J309 Allergic rhinitis, unspecified: Secondary | ICD-10-CM | POA: Insufficient documentation

## 2010-12-23 DIAGNOSIS — M549 Dorsalgia, unspecified: Secondary | ICD-10-CM

## 2010-12-23 DIAGNOSIS — J449 Chronic obstructive pulmonary disease, unspecified: Secondary | ICD-10-CM

## 2010-12-23 MED ORDER — PREDNISONE 5 MG PO TABS: 5.0000 mg | ORAL_TABLET | Freq: Two times a day (BID) | ORAL | Status: AC

## 2010-12-23 MED ORDER — KETOROLAC TROMETHAMINE 60 MG/2ML IM SOLN
60.0000 mg | Freq: Once | INTRAMUSCULAR | Status: AC
  Administered 2010-12-23: 60 mg via INTRAMUSCULAR

## 2010-12-23 MED ORDER — METHYLPREDNISOLONE ACETATE PF 80 MG/ML IJ SUSP
80.0000 mg | Freq: Once | INTRAMUSCULAR | Status: AC
Start: 2010-12-23 — End: 2010-12-23
  Administered 2010-12-23: 80 mg via INTRAMUSCULAR

## 2010-12-23 NOTE — Patient Instructions (Signed)
F/u in 4 months  Toradol 60mg  IM and depomedrol 80 MG in office  For back and knee pain , and prednisone is prescribed for 5 days start tomorrow  You need to quit smoking!

## 2010-12-23 NOTE — Assessment & Plan Note (Signed)
Uncontrolled injection sin the office today and short course of prednisone 5 mg one twice daily for 5 days

## 2010-12-23 NOTE — Progress Notes (Signed)
  Subjective:    Patient ID: Jennifer [name], female    DOB: 08-May-1922, 77 y.o.   MRN: [id number]  HPI Increased head and chest congestion with clear drainage, runny nose, itchy  Eyes for the past 2 weeks, denies fever or chills, npo yellow drainage or sputum. C/o increased and uncontrolled back back and knee pain Still smoking states she wants to cut down not willing to set quit date  Review of Systems See HPI Denies recent fever or chills. Denies chest pains, palpitations and leg swelling Denies abdominal pain, nausea, vomiting,diarrhea or constipation.   Denies dysuria, frequency, hesitancy or incontinence. Denies headaches, seizures, numbness, or tingling. Denies depression, anxiety or insomnia. Denies skin break down or rash.        Objective:   Physical Exam Patient alert and oriented and in no cardiopulmonary distress.  HEENT: No facial asymmetry, EOMI, no sinus tenderness,  oropharynx pink and moist.  Neck supple no adenopathy.Eryhtema and edema of nasal mucosa  Chest: Clear to auscultation bilaterally.Decreased air entry throughout  CVS: S1, S2 no murmurs, no S3.  ABD: Soft non tender. Bowel sounds normal.  Ext: No edema  MS: decreased  ROM spine, shoulders, hips and knees.  Skin: Intact, no ulcerations or rash noted.  Psych: Good eye contact, normal affect. Memory intact not anxious or depressed appearing.  CNS: CN 2-12 intact, power, tone and sensation normal throughout.        Assessment & Plan:

## 2010-12-23 NOTE — Assessment & Plan Note (Signed)
Progressively worsening with ongoing nicotine use

## 2010-12-23 NOTE — Assessment & Plan Note (Signed)
Counseled to quit still smoking

## 2011-01-20 ENCOUNTER — Telehealth: Payer: Self-pay | Admitting: Orthopedic Surgery

## 2011-01-20 NOTE — Telephone Encounter (Signed)
Jennifer [name] just called in asking for an appointment to see you.  Reminded her of last note where you said you did not have a solution But could refer her to a spine specialist.  She said OK to go ahead and refer her. She asked who it would be, I told her we will call her back when  You let us know.

## 2011-01-20 NOTE — Telephone Encounter (Signed)
[facility]

## 2011-01-23 ENCOUNTER — Telehealth: Payer: Self-pay | Admitting: Radiology

## 2011-01-23 NOTE — Telephone Encounter (Signed)
I faxed a referral for this patient to [facility] to be seen for her back. 

## 2011-01-27 DIAGNOSIS — H40019 Open angle with borderline findings, low risk, unspecified eye: Secondary | ICD-10-CM | POA: Diagnosis not present

## 2011-01-29 ENCOUNTER — Ambulatory Visit: Payer: Medicare Other | Admitting: Family Medicine

## 2011-02-03 ENCOUNTER — Ambulatory Visit (INDEPENDENT_AMBULATORY_CARE_PROVIDER_SITE_OTHER): Payer: Medicare Other | Admitting: Family Medicine

## 2011-02-03 ENCOUNTER — Encounter: Payer: Self-pay | Admitting: Family Medicine

## 2011-02-03 VITALS — BP 130/90 | HR 106 | Temp 99.0°F | Resp 16 | Ht 65.0 in | Wt 176.0 lb

## 2011-02-03 DIAGNOSIS — F17200 Nicotine dependence, unspecified, uncomplicated: Secondary | ICD-10-CM

## 2011-02-03 DIAGNOSIS — J069 Acute upper respiratory infection, unspecified: Secondary | ICD-10-CM

## 2011-02-03 DIAGNOSIS — M1990 Unspecified osteoarthritis, unspecified site: Secondary | ICD-10-CM | POA: Diagnosis not present

## 2011-02-03 DIAGNOSIS — J449 Chronic obstructive pulmonary disease, unspecified: Secondary | ICD-10-CM

## 2011-02-03 MED ORDER — PREDNISONE 10 MG PO TABS: ORAL_TABLET | ORAL | Status: DC

## 2011-02-03 MED ORDER — IPRATROPIUM-ALBUTEROL 18-103 MCG/ACT IN AERO: INHALATION_SPRAY | RESPIRATORY_TRACT | Status: DC

## 2011-02-03 MED ORDER — METHYLPREDNISOLONE ACETATE 40 MG/ML IJ SUSP
40.0000 mg | Freq: Once | INTRAMUSCULAR | Status: AC
  Administered 2011-02-03: 40 mg via INTRAMUSCULAR

## 2011-02-03 MED ORDER — AZITHROMYCIN 500 MG PO TABS: 500.0000 mg | ORAL_TABLET | Freq: Every day | ORAL | Status: DC

## 2011-02-03 MED ORDER — KETOROLAC TROMETHAMINE 30 MG/ML IJ SOLN
30.0000 mg | Freq: Once | INTRAMUSCULAR | Status: AC
  Administered 2011-02-03: 30 mg via INTRAMUSCULAR

## 2011-02-03 NOTE — Progress Notes (Signed)
  Subjective:    Patient ID: Jennifer [name], female    DOB: April 16, 1922, 78 y.o.   MRN: [id number]  HPI   Runny nose and cough x 24 hours, cough non productive , no fever, no vomiting, no diarrhea, no bodyaches, has not been using combivent, needs a refill on inhaler   +Flu shot   Arthritis- Shoulder pain, back, knees , known arthritis in all joints,wants something for pain today. Reviewed last ortho notes     Review of Systems  GEN- denies fatigue, fever, weight loss,weakness, recent illness HEENT- denies eye drainage, change in vision,+ nasal discharge, CVS- denies chest pain, palpitations RESP- denies SOB,+ cough, +wheeze ABD- denies N/V, change in stools, abd pain GU- denies dysuria, hematuria, MSK- + joint pain, muscle aches, injury Neuro- denies headache, dizziness, syncope, seizure activity       Objective:   Physical Exam GEN- NAD, alert and oriented x3 HEENT- PERRL, EOMI, non injected sclera, pink conjunctiva, MMM, oropharynx clear, TM clear bilat Neck- Supple, no LAD CVS- RRR, 2/6 SEM RESP-bilateral scattered expiratory wheeze, normal WOB, +cough, no retractions,no rhonchi, no crackles EXT- no edema Pulses- Radial, DP- 2+ Knees- no effusion, surgical scars, decreased flexion/ext degrees Shoulder- decreased ROM, TTP over AC joint,neg impingment, rotator cuff in tact          Assessment & Plan:

## 2011-02-03 NOTE — Patient Instructions (Addendum)
You have bronchitis- take the antibiotic as prescribed and the steroid pills Use your inhaler four times a day For your arthritis- you have been given 2 shots of medication to help- Depo Medrol and Toradol I recommend you quit smoking F/U on Friday for a recheck, if you feel okay then call and cancel and let us know.

## 2011-02-04 ENCOUNTER — Other Ambulatory Visit: Payer: Self-pay | Admitting: Family Medicine

## 2011-02-04 ENCOUNTER — Encounter: Payer: Self-pay | Admitting: Family Medicine

## 2011-02-04 DIAGNOSIS — J069 Acute upper respiratory infection, unspecified: Secondary | ICD-10-CM | POA: Insufficient documentation

## 2011-02-04 NOTE — Assessment & Plan Note (Signed)
Pt not ready to quit

## 2011-02-04 NOTE — Assessment & Plan Note (Signed)
High risk for infection with continued tobacco use and COPD Will treat per above Recheck if not improved

## 2011-02-04 NOTE — Assessment & Plan Note (Signed)
Multiple joint OA, given steroid injection, small dose of steroids for both URI and joint pain, avoid narcotics in this elderly female at this time

## 2011-02-04 NOTE — Assessment & Plan Note (Signed)
Known COPD, likley viral etiology started illness, will cover with antibiotics Refilled combivent Low dose steroid burst Advised cessation

## 2011-02-09 ENCOUNTER — Ambulatory Visit (INDEPENDENT_AMBULATORY_CARE_PROVIDER_SITE_OTHER): Payer: Medicare Other | Admitting: Family Medicine

## 2011-02-09 ENCOUNTER — Encounter: Payer: Self-pay | Admitting: Family Medicine

## 2011-02-09 VITALS — BP 128/74 | HR 98 | Resp 18 | Ht 65.0 in | Wt 181.0 lb

## 2011-02-09 DIAGNOSIS — M1990 Unspecified osteoarthritis, unspecified site: Secondary | ICD-10-CM

## 2011-02-09 DIAGNOSIS — J069 Acute upper respiratory infection, unspecified: Secondary | ICD-10-CM | POA: Diagnosis not present

## 2011-02-09 DIAGNOSIS — F17200 Nicotine dependence, unspecified, uncomplicated: Secondary | ICD-10-CM | POA: Diagnosis not present

## 2011-02-09 DIAGNOSIS — J449 Chronic obstructive pulmonary disease, unspecified: Secondary | ICD-10-CM

## 2011-02-09 DIAGNOSIS — J4489 Other specified chronic obstructive pulmonary disease: Secondary | ICD-10-CM

## 2011-02-09 NOTE — Assessment & Plan Note (Signed)
Pt not ready to quit tobacco at this time

## 2011-02-09 NOTE — Assessment & Plan Note (Signed)
Resolved, completed course of antibiotics

## 2011-02-09 NOTE — Progress Notes (Signed)
  Subjective:    Patient ID: Jennifer [name], female    DOB: Jul 14, 1922, 78 y.o.   MRN: [id number]  HPI  Pt here to f/u arthritis and COPD exacerbation. No concerns feels well today. States breathing is good and she no longer has a runny nose. She uses her Combivent as needed. States shots helped her arthritis a lot    Review of Systems  GEN- denies fatigue, fever, weight loss,weakness, recent illness HEENT- denies eye drainage, change in vision, nasal discharge, CVS- denies chest pain, palpitations RESP- denies SOB denies cough, +wheeze MSK- + joint pain, muscle aches, injury Neuro- denies headache, dizziness, syncope, seizure activity     Objective:   Physical Exam GEN- NAD, alert and oriented x3 HEENT-  MMM, oropharynx clear,  Neck- Supple, no LAD CVS- RRR, 2/6 SEM RESP-bilateral scattered expiratory wheeze, normal WOB, no retractions,no rhonchi, no crackles EXT- no edema Pulses- Radial, DP- 2+        Assessment & Plan:

## 2011-02-09 NOTE — Patient Instructions (Signed)
Your breathing is better. Continue your inhaler you can use it four times if needed Call if your joint pain comes back F/U with Dr. Lodema Hong in 3 months

## 2011-02-09 NOTE — Assessment & Plan Note (Signed)
Improved acutely, she has recurrent flares of pain, if her knee returns or the shoulder would send her for joint injections by ortho

## 2011-02-09 NOTE — Assessment & Plan Note (Addendum)
Appears to be returning to  baseline

## 2011-02-17 ENCOUNTER — Telehealth: Payer: Self-pay | Admitting: Radiology

## 2011-02-17 NOTE — Telephone Encounter (Signed)
I called to give the patient her appointment at [facility] with Dr. Phoebe Perch on 02-27-11 at 1:00. Patient is aware of the appointment.

## 2011-03-02 ENCOUNTER — Telehealth: Payer: Self-pay | Admitting: Orthopedic Surgery

## 2011-03-02 ENCOUNTER — Telehealth: Payer: Self-pay | Admitting: Family Medicine

## 2011-03-02 NOTE — Telephone Encounter (Signed)
Called daughter and left message to call back.

## 2011-03-02 NOTE — Telephone Encounter (Signed)
Patient's daughter [name], cell ph [phone], states mother has relayed that she was seen in [facility] Friday, 02/27/11, per Dr. Mort Sawyers referral.  Daughter said she cannot get much information from mother about why she was referred to [facility] (neurosurgeon).  She has a signed medical authorization on file to receive information.  Please advise.

## 2011-03-02 NOTE — Telephone Encounter (Signed)
She was wanting to know how her mother was doing from her last visit. She states she is on the medical release to receive information and she would like to speak with you. Her mother is unable to provide her with much information and she also was seen by a neuro Friday and doesn't know why. (Was referred by Romeo Apple and I advised her to call his office and I see where a message was already taken for him) Wants you to call also

## 2011-03-04 ENCOUNTER — Telehealth: Payer: Self-pay | Admitting: Family Medicine

## 2011-03-04 NOTE — Telephone Encounter (Signed)
Left message for daughter to call back, pls get me to the phone if she calls

## 2011-03-06 NOTE — Telephone Encounter (Signed)
Pt aware.

## 2011-03-06 NOTE — Telephone Encounter (Signed)
Spoke with daughter, whose main concern is regarding neurosurgeon consultation.advised her to call dr Romeo Apple  Also concerned about her Mom's safety to be alone , recently left pot on the stove, whch she forgot and fire dept had to be called  Wanted to know mother's condition when last seen

## 2011-03-09 NOTE — Telephone Encounter (Signed)
Spoke with daughter, who will relay again to patient.

## 2011-03-09 NOTE — Telephone Encounter (Signed)
03/09/11 (1) Patient's daughter called back again to follow up on the reason that her mother was referred to neurosurgeon              (2) Patient again stopped by our office to ask when she can see Dr. Romeo Apple   Both daughter and patient advised of recommendation of referral per last office note, and that report from neurosurgeon has not yet been received at this time.

## 2011-03-09 NOTE — Telephone Encounter (Signed)
She was seen for her spinal stenosis which I )as you know ) do not do surgery for

## 2011-03-30 ENCOUNTER — Encounter: Payer: Self-pay | Admitting: Family Medicine

## 2011-03-30 ENCOUNTER — Ambulatory Visit: Payer: Medicare Other | Admitting: Family Medicine

## 2011-04-09 ENCOUNTER — Telehealth: Payer: Self-pay | Admitting: Family Medicine

## 2011-04-09 NOTE — Telephone Encounter (Signed)
Attempted to contact daughter not available,  Message left to call back next week if she so desires

## 2011-04-13 NOTE — Telephone Encounter (Signed)
Discussed with daughter possible use of tramadol for pain, at the end of the discussion asked about potential addictive property, I advised since this was a bigger concern than pain at this time, to hold off on this medication. Jennifer [name] is on her way to [place] for 2 to 3 weeks with her daughter

## 2011-04-21 ENCOUNTER — Other Ambulatory Visit: Payer: Self-pay | Admitting: Family Medicine

## 2011-04-21 ENCOUNTER — Telehealth: Payer: Self-pay | Admitting: Family Medicine

## 2011-04-21 MED ORDER — TRAMADOL HCL 50 MG PO TABS: ORAL_TABLET | ORAL | Status: DC

## 2011-04-21 NOTE — Telephone Encounter (Signed)
states that in the past 2 weeks no relief, I suggest tramadol 50mg  one twice daily as needed, reports she gave her the same tablet and she got relief.  Daughter will be calling in the morning requesting that script which I am entering historically be faxed to a drug store in [place].Pls do this

## 2011-04-22 ENCOUNTER — Telehealth: Payer: Self-pay | Admitting: Family Medicine

## 2011-04-22 NOTE — Telephone Encounter (Signed)
Noted and rx faxed.  

## 2011-04-22 NOTE — Telephone Encounter (Signed)
Noted  

## 2011-04-23 ENCOUNTER — Ambulatory Visit: Payer: Medicare Other | Admitting: Family Medicine

## 2011-04-27 ENCOUNTER — Other Ambulatory Visit: Payer: Self-pay

## 2011-04-27 ENCOUNTER — Telehealth: Payer: Self-pay | Admitting: Family Medicine

## 2011-04-27 MED ORDER — TRAMADOL HCL 50 MG PO TABS: ORAL_TABLET | ORAL | Status: DC

## 2011-04-27 NOTE — Telephone Encounter (Signed)
Spoke with daughter

## 2011-04-27 NOTE — Telephone Encounter (Signed)
I have been assured that the situation is cleared up fully

## 2011-05-13 ENCOUNTER — Encounter: Payer: Self-pay | Admitting: Family Medicine

## 2011-05-13 ENCOUNTER — Ambulatory Visit (INDEPENDENT_AMBULATORY_CARE_PROVIDER_SITE_OTHER): Payer: Medicare Other | Admitting: Family Medicine

## 2011-05-13 VITALS — BP 140/86 | HR 71 | Resp 15 | Ht 65.0 in | Wt 178.0 lb

## 2011-05-13 DIAGNOSIS — J449 Chronic obstructive pulmonary disease, unspecified: Secondary | ICD-10-CM | POA: Diagnosis not present

## 2011-05-13 DIAGNOSIS — Z79899 Other long term (current) drug therapy: Secondary | ICD-10-CM

## 2011-05-13 DIAGNOSIS — F17200 Nicotine dependence, unspecified, uncomplicated: Secondary | ICD-10-CM | POA: Diagnosis not present

## 2011-05-13 DIAGNOSIS — R5381 Other malaise: Secondary | ICD-10-CM | POA: Diagnosis not present

## 2011-05-13 DIAGNOSIS — M1990 Unspecified osteoarthritis, unspecified site: Secondary | ICD-10-CM | POA: Diagnosis not present

## 2011-05-13 DIAGNOSIS — R5383 Other fatigue: Secondary | ICD-10-CM

## 2011-05-13 MED ORDER — TRAMADOL HCL 50 MG PO TABS: ORAL_TABLET | ORAL | Status: DC

## 2011-05-13 NOTE — Patient Instructions (Signed)
F/u early September  CBC, fasting chem 7, lipid,hepatic, [facility] 8/28 or after  Stop ibuprofen, use pain patches instead.   Please stop smoking.  Be very careful not to fall

## 2011-05-13 NOTE — Progress Notes (Signed)
  Subjective:    Patient ID: Jennifer [name], female    DOB: [birth year], 78 y.o.   MRN: [id number]  HPI The PT is here for follow up and re-evaluation of chronic medical conditions, medication management and review of any available recent lab and radiology data. She is accompanied by her daughter with whom she now lives in [place], recently lost her driving privilidges, which bothers her Preventive health is updated, specifically  Cancer screening and Immunization.   Questions or concerns regarding consultations or procedures which the PT has had in the interim are  addressed. The PT denies any adverse reactions to current medications since the last visit.  C/o chronic back and shoulder pain      Review of Systems See HPI Denies recent fever or chills. Denies sinus pressure, nasal congestion, ear pain or sore throat. Denies chest congestion, productive cough or wheezing. Denies chest pains, palpitations and leg swelling Denies abdominal pain, nausea, vomiting,diarrhea or constipation.   Denies dysuria, frequency, hesitancy or incontinence. Denies headaches, seizures, numbness, or tingling. Denies uncontrolled  depression, anxiety or insomnia. Denies skin break down or rash.        Objective:   Physical Exam  Patient alert and oriented and in no cardiopulmonary distress.  HEENT: No facial asymmetry, EOMI, no sinus tenderness,  oropharynx pink and moist.  Neck supple no adenopathy.  Chest: Clear to auscultation bilaterally.Decreased air entry throughout  CVS: S1, S2 no murmurs, no S3.  ABD: Soft non tender. Bowel sounds normal.  Ext: No edema  MS: decreased  ROM spine, shoulders, hips and knees.  Skin: Intact, no ulcerations or rash noted.  Psych: Good eye contact, normal affect. Memory mildly impaired not anxious or depressed appearing.  CNS: CN 2-12 intact, power,  normal throughout.       Assessment & Plan:

## 2011-05-17 NOTE — Assessment & Plan Note (Signed)
unchanged

## 2011-05-17 NOTE — Assessment & Plan Note (Signed)
Generalized joint pain, swelling and deformity, has had replacements. High fall risk, currently unable to drive and living out of tte with her daughter. Safety and fall prevention stressed, chronic pain management discussed

## 2011-05-17 NOTE — Assessment & Plan Note (Signed)
Deteriorating due to ongoing nicotine use ?

## 2011-05-19 ENCOUNTER — Other Ambulatory Visit: Payer: Self-pay

## 2011-05-19 MED ORDER — IBUPROFEN 800 MG PO TABS: ORAL_TABLET | ORAL | Status: DC

## 2011-05-22 ENCOUNTER — Telehealth: Payer: Self-pay | Admitting: Family Medicine

## 2011-05-22 NOTE — Telephone Encounter (Signed)
Pt received med.

## 2011-05-26 DIAGNOSIS — H52229 Regular astigmatism, unspecified eye: Secondary | ICD-10-CM | POA: Diagnosis not present

## 2011-05-26 DIAGNOSIS — H40019 Open angle with borderline findings, low risk, unspecified eye: Secondary | ICD-10-CM | POA: Diagnosis not present

## 2011-05-26 DIAGNOSIS — H2589 Other age-related cataract: Secondary | ICD-10-CM | POA: Diagnosis not present

## 2011-05-26 DIAGNOSIS — H5200 Hypermetropia, unspecified eye: Secondary | ICD-10-CM | POA: Diagnosis not present

## 2011-06-23 ENCOUNTER — Telehealth: Payer: Self-pay | Admitting: Family Medicine

## 2011-06-23 ENCOUNTER — Telehealth: Payer: Self-pay | Admitting: Orthopedic Surgery

## 2011-06-23 NOTE — Telephone Encounter (Signed)
Please advise 

## 2011-06-23 NOTE — Telephone Encounter (Signed)
The knee cant be injected she has total knee replacements

## 2011-06-23 NOTE — Telephone Encounter (Signed)
Yes, she is on both. OK for her to take the ibuprofen once daily if pain not too severe

## 2011-06-23 NOTE — Telephone Encounter (Signed)
Jennifer [name] called this morning asking for an appointment for her back, says her back is hurting so bad, and going into her knees.  Explained that she had been referred to [facility] and she should call them to see about an appointment.  She asked if you will give her a shot in her knees? Please advise

## 2011-06-23 NOTE — Telephone Encounter (Signed)
Patient aware.

## 2011-06-23 NOTE — Telephone Encounter (Signed)
Patient advised.

## 2011-06-25 ENCOUNTER — Encounter: Payer: Self-pay | Admitting: Family Medicine

## 2011-06-25 ENCOUNTER — Ambulatory Visit (HOSPITAL_COMMUNITY)
Admission: RE | Admit: 2011-06-25 | Discharge: 2011-06-25 | Disposition: A | Discharge status: Home / Self Care | Payer: Medicare Other | Source: Ambulatory Visit | Attending: Family Medicine | Admitting: Family Medicine

## 2011-06-25 ENCOUNTER — Ambulatory Visit (INDEPENDENT_AMBULATORY_CARE_PROVIDER_SITE_OTHER): Payer: Medicare Other | Admitting: Family Medicine

## 2011-06-25 VITALS — BP 130/74 | HR 79 | Resp 18 | Ht 65.0 in | Wt 169.0 lb

## 2011-06-25 DIAGNOSIS — R5381 Other malaise: Secondary | ICD-10-CM

## 2011-06-25 DIAGNOSIS — F17200 Nicotine dependence, unspecified, uncomplicated: Secondary | ICD-10-CM | POA: Diagnosis not present

## 2011-06-25 DIAGNOSIS — M899 Disorder of bone, unspecified: Secondary | ICD-10-CM

## 2011-06-25 DIAGNOSIS — N3000 Acute cystitis without hematuria: Secondary | ICD-10-CM

## 2011-06-25 DIAGNOSIS — Z1329 Encounter for screening for other suspected endocrine disorder: Secondary | ICD-10-CM

## 2011-06-25 DIAGNOSIS — Z79899 Other long term (current) drug therapy: Secondary | ICD-10-CM | POA: Diagnosis not present

## 2011-06-25 DIAGNOSIS — J449 Chronic obstructive pulmonary disease, unspecified: Secondary | ICD-10-CM

## 2011-06-25 DIAGNOSIS — Z13220 Encounter for screening for lipoid disorders: Secondary | ICD-10-CM

## 2011-06-25 DIAGNOSIS — Z1211 Encounter for screening for malignant neoplasm of colon: Secondary | ICD-10-CM | POA: Diagnosis not present

## 2011-06-25 DIAGNOSIS — R5383 Other fatigue: Secondary | ICD-10-CM

## 2011-06-25 DIAGNOSIS — R634 Abnormal weight loss: Secondary | ICD-10-CM

## 2011-06-25 DIAGNOSIS — Z130 Encounter for screening for diseases of the blood and blood-forming organs and certain disorders involving the immune mechanism: Secondary | ICD-10-CM | POA: Diagnosis not present

## 2011-06-25 DIAGNOSIS — J4489 Other specified chronic obstructive pulmonary disease: Secondary | ICD-10-CM | POA: Insufficient documentation

## 2011-06-25 DIAGNOSIS — Z1321 Encounter for screening for nutritional disorder: Secondary | ICD-10-CM

## 2011-06-25 LAB — POCT URINALYSIS DIPSTICK
Blood, UA: NEGATIVE
Nitrite, UA: NEGATIVE
Protein, UA: 100
pH, UA: 6

## 2011-06-25 LAB — POC HEMOCCULT BLD/STL (OFFICE/1-CARD/DIAGNOSTIC): Fecal Occult Blood, POC: NEGATIVE

## 2011-06-25 NOTE — Addendum Note (Signed)
Addended by: Kandis Fantasia B on: 06/25/2011 04:07 PM   Modules accepted: Orders

## 2011-06-25 NOTE — Addendum Note (Signed)
Addended by: Syliva Overman MD E on: 06/25/2011 03:12 PM   Modules accepted: Level of Service

## 2011-06-25 NOTE — Assessment & Plan Note (Signed)
unchanged

## 2011-06-25 NOTE — Addendum Note (Signed)
Addended by: Kandis Fantasia B on: 06/25/2011 04:13 PM   Modules accepted: Orders

## 2011-06-25 NOTE — Assessment & Plan Note (Signed)
ccua in office

## 2011-06-25 NOTE — Addendum Note (Signed)
Addended by: Kandis Fantasia B on: 06/25/2011 03:48 PM   Modules accepted: Orders

## 2011-06-25 NOTE — Progress Notes (Addendum)
  Subjective:    Patient ID: Jennifer [name], female    DOB: 02/29/1922, 78 y.o.   MRN: [id number]  HPI The PT is here for follow up and re-evaluation of chronic medical conditions, medication management and review of any available recent lab and radiology data.  Preventive health is updated, specifically  Cancer screening and Immunization.   Questions or concerns regarding consultations or procedures which the PT has had in the interim are  addressed. Family is concerned that pt has excessive sleepiness with tramadol, her appetite is poor and she has lost a lot of weight    Review of Systems    Patient alert and oriented and in no cardiopulmonary distress.  HEENT: No facial asymmetry, EOMI, no sinus tenderness,  oropharynx pink and moist.  Neck supple no adenopathy.  Chest: Clear to auscultation bilaterally.Decreased air entry throughout  CVS: S1, S2 no murmurs, no S3.  ABD: Soft non tender. Bowel sounds normal.Rectal guaiac neg stool  Ext: No edema  MS: decreased  ROM spine, shoulders, hips and knees.  Skin: Intact, no ulcerations or rash noted.  Psych: Good eye contact, normal affect. Memory iloss not anxious or depressed appearing.  CNS: CN 2-12 intact, power, tone and sensation normal throughout.  Objective:   Physical Exam        Assessment & Plan:

## 2011-06-25 NOTE — Progress Notes (Signed)
  Subjective:    Patient ID: Jennifer [name], female    DOB: August 12, 1922, 78 y.o.   MRN: [id number]  HPI    Review of Systems     Objective:   Physical Exam        Assessment & Plan:

## 2011-06-25 NOTE — Assessment & Plan Note (Signed)
Worsened counseled to quit

## 2011-06-25 NOTE — Patient Instructions (Addendum)
F/u in 2 month, call for sooner appointment if needed  Please STOP Tramadol since this is making you too sleepy   Urine is being checked today, and you need fasting labs tomorrow please, cbc, cmp, tsh, lipid and vit D, and h pylori  Please be careful not to fall  You need to make an effort to increase how much and how often you eat , you need to work on gaining weight or certainly not losing anymore  You need a cxr , this is ordered, and I am doing a rectal exam in the office today also  Please try to stop smoking, this will improve your health

## 2011-06-25 NOTE — Assessment & Plan Note (Signed)
Worsening with poor appetite, pt needs comprehensive labs, rectal in office negative for blood

## 2011-06-28 LAB — URINE CULTURE

## 2011-07-10 ENCOUNTER — Encounter: Payer: Self-pay | Admitting: Family Medicine

## 2011-07-10 ENCOUNTER — Ambulatory Visit (HOSPITAL_COMMUNITY)
Admission: RE | Admit: 2011-07-10 | Discharge: 2011-07-10 | Disposition: A | Discharge status: Home / Self Care | Payer: Medicare Other | Source: Ambulatory Visit | Attending: Family Medicine | Admitting: Family Medicine

## 2011-07-10 ENCOUNTER — Ambulatory Visit (INDEPENDENT_AMBULATORY_CARE_PROVIDER_SITE_OTHER): Payer: Medicare Other | Admitting: Family Medicine

## 2011-07-10 VITALS — BP 140/88 | HR 62 | Resp 16 | Ht 65.0 in | Wt 170.0 lb

## 2011-07-10 DIAGNOSIS — M4800 Spinal stenosis, site unspecified: Secondary | ICD-10-CM | POA: Diagnosis not present

## 2011-07-10 DIAGNOSIS — M545 Low back pain, unspecified: Secondary | ICD-10-CM | POA: Diagnosis not present

## 2011-07-10 DIAGNOSIS — M47817 Spondylosis without myelopathy or radiculopathy, lumbosacral region: Secondary | ICD-10-CM | POA: Insufficient documentation

## 2011-07-10 DIAGNOSIS — M25569 Pain in unspecified knee: Secondary | ICD-10-CM | POA: Diagnosis not present

## 2011-07-10 DIAGNOSIS — M5380 Other specified dorsopathies, site unspecified: Secondary | ICD-10-CM | POA: Diagnosis not present

## 2011-07-10 DIAGNOSIS — M4804 Spinal stenosis, thoracic region: Secondary | ICD-10-CM | POA: Diagnosis not present

## 2011-07-10 DIAGNOSIS — M546 Pain in thoracic spine: Secondary | ICD-10-CM | POA: Insufficient documentation

## 2011-07-10 DIAGNOSIS — M549 Dorsalgia, unspecified: Secondary | ICD-10-CM

## 2011-07-10 NOTE — Progress Notes (Signed)
  Subjective:    Patient ID: Jennifer [name], female    DOB: [birth year], 78 y.o.   MRN: [id number]  HPI Pt presents with knee and back pain both chronic in nature. She is has been seen by orthopedics and neurosurgery. No new injury. She continues to drive and provide for herself. Seen by PCP recently noted to have some weight loss and poor appetite sent for labs but has not had these done yet No change in bowel or bladder  Review of Systems   GEN- denies fatigue, fever, weight loss,weakness, recent illness CVS- denies chest pain, palpitations RESP- denies SOB, cough, wheeze ABD- denies N/V, change in stools, abd pain GU- denies dysuria, hematuria, dribbling, incontinence MSK- + joint pain, +muscle aches, injury Neuro- denies headache, dizziness, syncope, seizure activity      Objective:   Physical Exam  GEN- NAD, alert and oriented x3 HEENT- PERRL, EOMI, non injected sclera, pink conjunctiva, MMM, oropharynx clear CVS- RRR, no murmur RESP-CTAB, decreased at bases Back- Spine nontender, movement of lower ext equal bilat  MSK- decreased ROM bilat knees, no effusion, +crepitus EXT- No edema,  Pulses- Radial, DP- 2+        Assessment & Plan:

## 2011-07-10 NOTE — Patient Instructions (Addendum)
For your knees and back, use the ibuprofen twice a day with food Dont take the other pain relievers  (aleve, magenesium tetrahydrate with this) Get the x-rays done  Get the blood work done Use the muscle cream at home- Try Aspercreme  F/U with Dr. Lodema Hong as scheduled already

## 2011-07-12 NOTE — Assessment & Plan Note (Signed)
Chronic back pain, seen by neurosurery, note reviewed, pt declined epidural injections and not a good candidate for surgical intervention therefore no options left except pain control with meds, she did not tolerate ultram. Will send for plain film of back, last imaing MRI in 2011, to see if any lytic lesions with her weight loss.       Negative for new lesions

## 2011-07-12 NOTE — Assessment & Plan Note (Signed)
Chronic pain reviewed ortho note, no injections as s/p replacement of both knees, she is to use topical cream I reviewed her OTC meds, she had 3 different NSAIDS, advised to use the prescription strength motrin only.

## 2011-08-10 ENCOUNTER — Other Ambulatory Visit: Payer: Self-pay

## 2011-08-10 MED ORDER — IBUPROFEN 800 MG PO TABS: ORAL_TABLET | ORAL | Status: DC

## 2011-08-19 ENCOUNTER — Ambulatory Visit (INDEPENDENT_AMBULATORY_CARE_PROVIDER_SITE_OTHER): Payer: Medicare Other | Admitting: Family Medicine

## 2011-08-19 ENCOUNTER — Encounter: Payer: Self-pay | Admitting: Family Medicine

## 2011-08-19 VITALS — BP 142/80 | HR 74 | Resp 18 | Ht 65.0 in | Wt 173.1 lb

## 2011-08-19 DIAGNOSIS — H40019 Open angle with borderline findings, low risk, unspecified eye: Secondary | ICD-10-CM | POA: Diagnosis not present

## 2011-08-19 DIAGNOSIS — F17200 Nicotine dependence, unspecified, uncomplicated: Secondary | ICD-10-CM

## 2011-08-19 DIAGNOSIS — F0390 Unspecified dementia without behavioral disturbance: Secondary | ICD-10-CM | POA: Insufficient documentation

## 2011-08-19 DIAGNOSIS — G3184 Mild cognitive impairment, so stated: Secondary | ICD-10-CM

## 2011-08-19 DIAGNOSIS — M549 Dorsalgia, unspecified: Secondary | ICD-10-CM | POA: Diagnosis not present

## 2011-08-19 DIAGNOSIS — M1990 Unspecified osteoarthritis, unspecified site: Secondary | ICD-10-CM

## 2011-08-19 MED ORDER — DONEPEZIL HCL 5 MG PO TABS: 5.0000 mg | ORAL_TABLET | Freq: Every day | ORAL | Status: DC

## 2011-08-19 MED ORDER — KETOROLAC TROMETHAMINE 60 MG/2ML IJ SOLN
60.0000 mg | Freq: Once | INTRAMUSCULAR | Status: DC
  Administered 2011-08-19: 60 mg via INTRAMUSCULAR

## 2011-08-19 MED ORDER — METHYLPREDNISOLONE ACETATE 40 MG/ML IJ SUSP
40.0000 mg | Freq: Once | INTRAMUSCULAR | Status: AC
  Administered 2011-08-19: 40 mg via INTRAMUSCULAR

## 2011-08-19 MED ORDER — KETOROLAC TROMETHAMINE 60 MG/2ML IJ SOLN
30.0000 mg | Freq: Once | INTRAMUSCULAR | Status: AC
  Administered 2011-08-19: 30 mg via INTRAMUSCULAR

## 2011-08-19 MED ORDER — METHYLPREDNISOLONE ACETATE 80 MG/ML IJ SUSP: 80.0000 mg | Freq: Once | INTRAMUSCULAR | Status: DC

## 2011-08-19 MED ORDER — PREDNISONE 2.5 MG PO TABS: 2.5000 mg | ORAL_TABLET | Freq: Every day | ORAL | Status: DC

## 2011-08-19 NOTE — Patient Instructions (Addendum)
F/u in 3 month  STOP ibuprofen, new is prednisone 1 daly.  Your test shows you have mild memory impairment, I suggest you satrt a tablet to help with this, call if you decide to, aricept.  Toradol 30mg  and depo medrol 40mg  IM today.  Fasting labs past due, you will get the sheet again ordered in June

## 2011-08-22 DIAGNOSIS — M899 Disorder of bone, unspecified: Secondary | ICD-10-CM | POA: Diagnosis not present

## 2011-08-22 DIAGNOSIS — R5381 Other malaise: Secondary | ICD-10-CM | POA: Diagnosis not present

## 2011-08-22 DIAGNOSIS — Z79899 Other long term (current) drug therapy: Secondary | ICD-10-CM | POA: Diagnosis not present

## 2011-08-22 DIAGNOSIS — M949 Disorder of cartilage, unspecified: Secondary | ICD-10-CM | POA: Diagnosis not present

## 2011-08-22 DIAGNOSIS — Z13220 Encounter for screening for lipoid disorders: Secondary | ICD-10-CM | POA: Diagnosis not present

## 2011-08-22 LAB — COMPREHENSIVE METABOLIC PANEL
ALT: 9 U/L (ref 0–35)
AST: 13 U/L (ref 0–37)
Albumin: 3.8 g/dL (ref 3.5–5.2)
Alkaline Phosphatase: 96 U/L (ref 39–117)
Chloride: 102 mEq/L (ref 96–112)
Potassium: 4.3 mEq/L (ref 3.5–5.3)
Sodium: 141 mEq/L (ref 135–145)
Total Protein: 7.1 g/dL (ref 6.0–8.3)

## 2011-08-22 LAB — CBC
MCHC: 33.3 g/dL (ref 30.0–36.0)
Platelets: 290 10*3/uL (ref 150–400)
RDW: 15.1 % (ref 11.5–15.5)
WBC: 4.3 10*3/uL (ref 4.0–10.5)

## 2011-08-22 LAB — LIPID PANEL
LDL Cholesterol: 112 mg/dL — ABNORMAL HIGH (ref 0–99)
Triglycerides: 107 mg/dL (ref <150)
VLDL: 21 mg/dL (ref 0–40)
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
## 2011-08-23 NOTE — Assessment & Plan Note (Signed)
C/o ongoing uncontrolled pain. Safe medication administration is a problem, pt lives alone and her children are out of state. After discussion with her daughter, decision made for a trial of daily low dose prednisone, side effects of osteopetrosis risk and development of diabetes was discussed. Toradol and depo medrol administered in low doses at visit.

## 2011-08-23 NOTE — Assessment & Plan Note (Signed)
Unchanged, Patient counseled for approximately 5 minutes regarding the health risks of ongoing nicotine use, specifically all types of cancer, heart disease, stroke and respiratory failure. The options available for help with cessation ,the behavioral changes to assist the process, and the option to either gradully reduce usage  Or abruptly stop.is also discussed. Pt is also encouraged to set specific goals in number of cigarettes used daily, as well as to set a quit date.  

## 2011-08-24 LAB — VITAMIN D 25 HYDROXY (VIT D DEFICIENCY, FRACTURES): Vit D, 25-Hydroxy: 34 ng/mL (ref 30–89)

## 2011-09-17 ENCOUNTER — Ambulatory Visit: Payer: Medicare Other | Admitting: Family Medicine

## 2011-09-24 ENCOUNTER — Encounter: Payer: Self-pay | Admitting: Orthopedic Surgery

## 2011-09-24 ENCOUNTER — Ambulatory Visit (INDEPENDENT_AMBULATORY_CARE_PROVIDER_SITE_OTHER): Payer: Medicare Other | Admitting: Orthopedic Surgery

## 2011-09-24 ENCOUNTER — Ambulatory Visit (INDEPENDENT_AMBULATORY_CARE_PROVIDER_SITE_OTHER): Payer: Medicare Other

## 2011-09-24 VITALS — BP 142/70 | Ht 65.0 in | Wt 173.0 lb

## 2011-09-24 DIAGNOSIS — Z96659 Presence of unspecified artificial knee joint: Secondary | ICD-10-CM

## 2011-09-24 NOTE — Patient Instructions (Signed)
COME BACK FOR YEARLY XRAY OF LEFT TKA

## 2011-09-24 NOTE — Progress Notes (Signed)
Patient ID: Jennifer [name], female   DOB: October 20, 1922, 78 y.o.   MRN: [id number] Chief complaint total knee follow-up.  History this is a follow-up visit. Status post right  total knee replacement.  DOS: 2011  Implant DePuy yes Review of systems patient has no complaints.  Exam Physical Exam(6) GENERAL: normal development   CDV: pulses are normal   Skin: normal  Psychiatric: awake, alert and oriented  Neuro: normal sensation  1 ambulation cane  2 ROM = 125 3 Motor normal  4 Stability normal   Separate x-ray report.  Reason for x-ray, and we'll x-ray follow-up knee replacement.  3 views left  knee.  The implant is aligned normally. There is no loosening.  Impression normal appearing knee replacement.    Assessment: Knee replacement functioning well    Plan: One year follow

## 2011-10-02 ENCOUNTER — Telehealth: Payer: Self-pay | Admitting: Family Medicine

## 2011-10-02 NOTE — Telephone Encounter (Signed)
Called and left message for daughter.

## 2011-10-08 ENCOUNTER — Telehealth: Payer: Self-pay | Admitting: Family Medicine

## 2011-10-08 NOTE — Telephone Encounter (Signed)
Patient aware.

## 2011-10-16 DIAGNOSIS — Z23 Encounter for immunization: Secondary | ICD-10-CM | POA: Diagnosis not present

## 2011-11-11 ENCOUNTER — Other Ambulatory Visit: Payer: Self-pay | Admitting: Family Medicine

## 2011-11-13 ENCOUNTER — Encounter: Payer: Self-pay | Admitting: Family Medicine

## 2011-11-13 ENCOUNTER — Ambulatory Visit (INDEPENDENT_AMBULATORY_CARE_PROVIDER_SITE_OTHER): Payer: Medicare Other | Admitting: Family Medicine

## 2011-11-13 VITALS — BP 150/80 | HR 78 | Resp 18 | Ht 65.0 in | Wt 182.1 lb

## 2011-11-13 DIAGNOSIS — F17200 Nicotine dependence, unspecified, uncomplicated: Secondary | ICD-10-CM

## 2011-11-13 DIAGNOSIS — R030 Elevated blood-pressure reading, without diagnosis of hypertension: Secondary | ICD-10-CM

## 2011-11-13 DIAGNOSIS — M549 Dorsalgia, unspecified: Secondary | ICD-10-CM | POA: Diagnosis not present

## 2011-11-13 DIAGNOSIS — IMO0001 Reserved for inherently not codable concepts without codable children: Secondary | ICD-10-CM

## 2011-11-13 DIAGNOSIS — J449 Chronic obstructive pulmonary disease, unspecified: Secondary | ICD-10-CM | POA: Diagnosis not present

## 2011-11-13 DIAGNOSIS — G3184 Mild cognitive impairment, so stated: Secondary | ICD-10-CM

## 2011-11-13 MED ORDER — GABAPENTIN 100 MG PO CAPS: ORAL_CAPSULE | ORAL | Status: DC

## 2011-11-13 MED ORDER — KETOROLAC TROMETHAMINE 60 MG/2ML IJ SOLN
60.0000 mg | Freq: Once | INTRAMUSCULAR | Status: AC
  Administered 2011-11-13: 60 mg via INTRAMUSCULAR

## 2011-11-13 MED ORDER — DONEPEZIL HCL 10 MG PO TABS: 10.0000 mg | ORAL_TABLET | Freq: Every day | ORAL | Status: DC

## 2011-11-13 NOTE — Patient Instructions (Addendum)
F/U in 4 month  STOP or cut back on sodas, and try to eat more fruit and vegetables, this will help with your blood pressure , which is slightly elevated. Also please cut back on salt intake   Please CUT back on cigarettes, you need to quit  No prednisone, instead gabapentin at bedtime for pain  Toradol 60mg  iM today for arthritis pain and you are referred to a pain clinic  You will do very well in an adult day care environment, I am excited about this  Increase in aricept dose to 10mg  daily

## 2011-11-13 NOTE — Assessment & Plan Note (Addendum)
Current 10 per day, counseled to quit  Patient counseled for approximately 5 minutes regarding the health risks of ongoing nicotine use, specifically all types of cancer, heart disease, stroke and respiratory failure. The options available for help with cessation ,the behavioral changes to assist the process, and the option to either gradully reduce usage  Or abruptly stop.is also discussed. Pt is also encouraged to set specific goals in number of cigarettes used daily, as well as to set a quit date.

## 2011-11-13 NOTE — Progress Notes (Signed)
  Subjective:    Patient ID: RAYLEI LOSURDO, female    DOB: 03-Jun-1922, 78 y.o.   MRN: [id number]  HPI Pt in with her daughter who she has been staying with both here and in [place] since her last visit several months ago.She continues to c/o uncontrolled pain in back and lower extremities. No h/o falls, last xray of knee normal Daughter so no improvement with the low dose prednisone, however notes that if her Mom is distracted she does not c/o pain, and her sleep is undisturbed. Currently she wants a pain clinic eval, and plans to keep her with her, and get her involved in adult daycare in [place] where all the necessary support is already in place including transpt to the center. Ms. Junie Panning is smoking 10 ciggs per day, no commitment to quit date. Relationship between mom and daughter improved at this visit  Review of Systems See HPI Denies recent fever or chills. Denies sinus pressure, nasal congestion, ear pain or sore throat. Denies chest congestion, and productive cough. Denies chest pains, palpitations and leg swelling Denies abdominal pain, nausea, vomiting,diarrhea or constipation.   Denies dysuria, frequency, hesitancy or incontinence.  Denies headaches, seizures, numbness, or tingling. Denies depression, anxiety or insomnia. Denies skin break down or rash.        Objective:   Physical Exam  Patient alert and  in no cardiopulmonary distress.  HEENT: No facial asymmetry, EOMI, no sinus tenderness,  oropharynx pink and moist.  Neck decreased ROM no adenopathy.  Chest: Clear to auscultation bilaterally.Decreased  Though adequate air entry  CVS: S1, S2 no murmurs, no S3.  ABD: Soft non tender. Bowel sounds normal.  Ext: No edema  MS: decreased  ROM spine, shoulders, hips and knees.Ambulates with a cane  Skin: Intact, no ulcerations or rash noted.  Psych: Good eye contact, normal affect. Not  anxious or depressed appearing.  CNS: CN 2-12 intact, power,  normal  throughout.       Assessment & Plan:

## 2011-11-14 DIAGNOSIS — IMO0001 Reserved for inherently not codable concepts without codable children: Secondary | ICD-10-CM | POA: Insufficient documentation

## 2011-11-14 NOTE — Assessment & Plan Note (Signed)
Deteriorating due to ongoing nicotine use ?

## 2011-11-14 NOTE — Assessment & Plan Note (Signed)
No adverse side effectson aricept, will increase to treating dose

## 2011-11-14 NOTE — Assessment & Plan Note (Signed)
Chronic , uncontrolled pain, d/c prednisone, trial of gabapentin, and referral OK to be seen at pain clinic. Toradol administered

## 2011-11-14 NOTE — Assessment & Plan Note (Signed)
Smoking cessation, reduction in sodium and increased fruit and veg.recommended No meds at this visit

## 2011-11-17 DIAGNOSIS — G8929 Other chronic pain: Secondary | ICD-10-CM | POA: Diagnosis not present

## 2011-11-17 DIAGNOSIS — Z79899 Other long term (current) drug therapy: Secondary | ICD-10-CM | POA: Diagnosis not present

## 2011-11-17 DIAGNOSIS — M48061 Spinal stenosis, lumbar region without neurogenic claudication: Secondary | ICD-10-CM | POA: Diagnosis not present

## 2011-11-17 DIAGNOSIS — M47817 Spondylosis without myelopathy or radiculopathy, lumbosacral region: Secondary | ICD-10-CM | POA: Diagnosis not present

## 2011-11-18 DIAGNOSIS — G8929 Other chronic pain: Secondary | ICD-10-CM | POA: Diagnosis not present

## 2011-11-18 DIAGNOSIS — Z79899 Other long term (current) drug therapy: Secondary | ICD-10-CM | POA: Diagnosis not present

## 2011-11-30 DIAGNOSIS — M545 Low back pain: Secondary | ICD-10-CM | POA: Diagnosis not present

## 2011-11-30 DIAGNOSIS — G8929 Other chronic pain: Secondary | ICD-10-CM | POA: Diagnosis not present

## 2011-11-30 DIAGNOSIS — M47817 Spondylosis without myelopathy or radiculopathy, lumbosacral region: Secondary | ICD-10-CM | POA: Diagnosis not present

## 2011-12-08 ENCOUNTER — Other Ambulatory Visit: Payer: Self-pay | Admitting: Family Medicine

## 2011-12-16 DIAGNOSIS — G8928 Other chronic postprocedural pain: Secondary | ICD-10-CM | POA: Diagnosis not present

## 2011-12-16 DIAGNOSIS — M545 Low back pain: Secondary | ICD-10-CM | POA: Diagnosis not present

## 2011-12-16 DIAGNOSIS — M25569 Pain in unspecified knee: Secondary | ICD-10-CM | POA: Diagnosis not present

## 2011-12-18 DIAGNOSIS — G8928 Other chronic postprocedural pain: Secondary | ICD-10-CM | POA: Diagnosis not present

## 2011-12-18 DIAGNOSIS — M545 Low back pain: Secondary | ICD-10-CM | POA: Diagnosis not present

## 2011-12-18 DIAGNOSIS — M25569 Pain in unspecified knee: Secondary | ICD-10-CM | POA: Diagnosis not present

## 2012-01-01 ENCOUNTER — Other Ambulatory Visit: Payer: Self-pay | Admitting: Family Medicine

## 2012-01-01 ENCOUNTER — Telehealth: Payer: Self-pay | Admitting: Family Medicine

## 2012-01-01 DIAGNOSIS — M549 Dorsalgia, unspecified: Secondary | ICD-10-CM

## 2012-01-01 DIAGNOSIS — G3184 Mild cognitive impairment, so stated: Secondary | ICD-10-CM

## 2012-01-01 MED ORDER — LIDOCAINE 5 % EX PTCH: 3.0000 | MEDICATED_PATCH | Freq: Two times a day (BID) | CUTANEOUS | Status: DC

## 2012-01-01 MED ORDER — DONEPEZIL HCL 10 MG PO TABS: 10.0000 mg | ORAL_TABLET | Freq: Every day | ORAL | Status: DC

## 2012-01-01 NOTE — Telephone Encounter (Signed)
meds sent

## 2012-01-01 NOTE — Telephone Encounter (Signed)
Ok to refill Lidocaine?

## 2012-01-01 NOTE — Telephone Encounter (Signed)
Med refilled.

## 2012-01-01 NOTE — Telephone Encounter (Signed)
Message left

## 2012-01-01 NOTE — Telephone Encounter (Signed)
Ok to refill aricept 5mg  x 2 more, also lidoderm patches, she has them in historical meds Tell her [employer]!

## 2012-01-14 ENCOUNTER — Other Ambulatory Visit: Payer: Self-pay

## 2012-01-14 ENCOUNTER — Telehealth: Payer: Self-pay | Admitting: Family Medicine

## 2012-01-14 MED ORDER — IPRATROPIUM-ALBUTEROL 18-103 MCG/ACT IN AERO: INHALATION_SPRAY | RESPIRATORY_TRACT | Status: DC

## 2012-01-14 NOTE — Telephone Encounter (Signed)
Med refilled.

## 2012-01-27 DIAGNOSIS — M545 Low back pain: Secondary | ICD-10-CM | POA: Diagnosis not present

## 2012-01-27 DIAGNOSIS — M47817 Spondylosis without myelopathy or radiculopathy, lumbosacral region: Secondary | ICD-10-CM | POA: Diagnosis not present

## 2012-02-24 DIAGNOSIS — M545 Low back pain: Secondary | ICD-10-CM | POA: Diagnosis not present

## 2012-02-24 DIAGNOSIS — M1710 Unilateral primary osteoarthritis, unspecified knee: Secondary | ICD-10-CM | POA: Diagnosis not present

## 2012-03-28 ENCOUNTER — Telehealth: Payer: Self-pay | Admitting: Family Medicine

## 2012-03-28 DIAGNOSIS — J449 Chronic obstructive pulmonary disease, unspecified: Secondary | ICD-10-CM | POA: Diagnosis not present

## 2012-03-28 DIAGNOSIS — R5381 Other malaise: Secondary | ICD-10-CM

## 2012-03-28 DIAGNOSIS — Z79899 Other long term (current) drug therapy: Secondary | ICD-10-CM

## 2012-03-28 DIAGNOSIS — R5383 Other fatigue: Secondary | ICD-10-CM | POA: Diagnosis not present

## 2012-03-28 DIAGNOSIS — J4489 Other specified chronic obstructive pulmonary disease: Secondary | ICD-10-CM | POA: Diagnosis not present

## 2012-03-28 NOTE — Telephone Encounter (Signed)
Daughter called asking for lab order to be sent over to lab for patient to have drawn today.  Labs reordered due to being cancelled in the system and faxed over to the lab.

## 2012-03-29 DIAGNOSIS — R5381 Other malaise: Secondary | ICD-10-CM | POA: Diagnosis not present

## 2012-03-29 DIAGNOSIS — J449 Chronic obstructive pulmonary disease, unspecified: Secondary | ICD-10-CM | POA: Diagnosis not present

## 2012-03-29 DIAGNOSIS — Z79899 Other long term (current) drug therapy: Secondary | ICD-10-CM | POA: Diagnosis not present

## 2012-03-29 LAB — BASIC METABOLIC PANEL
BUN: 13 mg/dL (ref 6–23)
Chloride: 103 mEq/L (ref 96–112)
Glucose, Bld: 99 mg/dL (ref 70–99)
Potassium: 4 mEq/L (ref 3.5–5.3)
Sodium: 143 mEq/L (ref 135–145)

## 2012-03-29 LAB — CBC WITH DIFFERENTIAL/PLATELET
Basophils Relative: 0 % (ref 0–1)
Eosinophils Absolute: 0.1 10*3/uL (ref 0.0–0.7)
Eosinophils Relative: 2 % (ref 0–5)
MCH: 28.6 pg (ref 26.0–34.0)
MCHC: 32.3 g/dL (ref 30.0–36.0)
Neutrophils Relative %: 61 % (ref 43–77)
Platelets: 250 10*3/uL (ref 150–400)
RBC: 4.54 MIL/uL (ref 3.87–5.11)
RDW: 15.2 % (ref 11.5–15.5)

## 2012-03-29 LAB — TSH: TSH: 1.515 u[IU]/mL (ref 0.350–4.500)

## 2012-03-29 LAB — HEPATIC FUNCTION PANEL
ALT: 9 U/L (ref 0–35)
AST: 16 U/L (ref 0–37)
Albumin: 3.6 g/dL (ref 3.5–5.2)
Alkaline Phosphatase: 125 U/L — ABNORMAL HIGH (ref 39–117)
Bilirubin, Direct: 0.1 mg/dL (ref 0.0–0.3)

## 2012-03-29 LAB — LIPID PANEL: Cholesterol: 157 mg/dL (ref 0–200)

## 2012-03-30 ENCOUNTER — Ambulatory Visit (INDEPENDENT_AMBULATORY_CARE_PROVIDER_SITE_OTHER): Payer: Medicare Other | Admitting: Family Medicine

## 2012-03-30 ENCOUNTER — Encounter: Payer: Self-pay | Admitting: Family Medicine

## 2012-03-30 VITALS — BP 140/88 | HR 62 | Resp 18 | Ht 65.0 in | Wt 188.1 lb

## 2012-03-30 DIAGNOSIS — F17200 Nicotine dependence, unspecified, uncomplicated: Secondary | ICD-10-CM

## 2012-03-30 DIAGNOSIS — G3184 Mild cognitive impairment, so stated: Secondary | ICD-10-CM

## 2012-03-30 DIAGNOSIS — J4489 Other specified chronic obstructive pulmonary disease: Secondary | ICD-10-CM

## 2012-03-30 DIAGNOSIS — M1990 Unspecified osteoarthritis, unspecified site: Secondary | ICD-10-CM

## 2012-03-30 DIAGNOSIS — R030 Elevated blood-pressure reading, without diagnosis of hypertension: Secondary | ICD-10-CM

## 2012-03-30 DIAGNOSIS — J449 Chronic obstructive pulmonary disease, unspecified: Secondary | ICD-10-CM

## 2012-03-30 MED ORDER — DONEPEZIL HCL 5 MG PO TABS: 5.0000 mg | ORAL_TABLET | Freq: Every day | ORAL | Status: DC

## 2012-03-30 NOTE — Patient Instructions (Addendum)
F/u in 6 month/ as needed, please call to schedule appointment.Call if you need me  Reduced dose of aricept per request of your daughter to see if she perceives improvement in your memory.   You need to stop smoking, air entry in the lungs is very poor , due to cigarette use, you have emphysema.  You arr referred for overnight study while asleep to see if you need oxygen at night  Blood sugar is normal as is your liver function, kidney function, thyroid function and bone marrow.Copy of labs is being sent to your daughter  No driving or riding lawn mower. You have memory loss and severe arthritis which make both dangerous for you    You have gained 17 pounds and look well.  You need to join a senior's group as well as start attending a [employer], work with your grandson to make this happen  Have your daughter ask about palliative care, which is NOT hospice, but very helpful , they will help steer her to respit care and any other help she can get in your care

## 2012-03-30 NOTE — Progress Notes (Signed)
  Subjective:    Patient ID: Jennifer [name], female    DOB: 1922/01/18, 79 y.o.   MRN: [id number]  HPI Pt in visiting from [place] where she now lives with her daughter. Grandson accompanies with a list of several specific concerns. Concern about how lungs sound, pt still smokes and is unwilling to comiting to quitting Concern about benefit of aricept requesting dose reduction or cessation thinks making her memory worse. Will defer to PCP where she lives to track this, I do not believe the med is having the opposite effect for which prescribed. Still not in adult daycare but this will be sorted out when she goes back she states she is interested in going. Request that letter be provided advising revoking her driver's license . I agree with this, she has established memory loss as well as severe arthritis which would limit her ability to drive safely Pt denies falls, she has gained over 15 pounds and looks well Lab results are stellar   Review of Systems See HPI Denies recent fever or chills. Denies sinus pressure, nasal congestion, ear pain or sore throat. Denies chest congestion, productive cough or wheezing. Denies chest pains, palpitations and leg swelling Denies abdominal pain, nausea, vomiting,diarrhea or constipation.   Denies dysuria, frequency, hesitancy or incontinence. Chronic  joint pain,espescially knee and back with  limitation in mobility. Denies headaches, seizures, numbness, or tingling. Denies depression, anxiety or insomnia. Denies skin break down or rash.        Objective:   Physical Exam  Patient alert and oriented and in no cardiopulmonary distress.  HEENT: No facial asymmetry, EOMI, no sinus tenderness,  oropharynx pink and moist.  Neck supple no adenopathy.  Chest: Clear to auscultation bilaterally.Decreased air entry throughout  CVS: S1, S2 no murmurs, no S3.  ABD: Soft non tender. Bowel sounds normal.  Ext: No edema  MS: decreased  ROM spine,  shoulders, hips and knees.  Skin: Intact, no ulcerations or rash noted.  Psych: Good eye contact, normal affect. Memory impaired  not anxious or depressed appearing.  CNS: CN 2-12 intact, power, tone and sensation normal throughout.       Assessment & Plan:

## 2012-04-03 NOTE — Assessment & Plan Note (Signed)
Blood pressure at an acceptable level for age at this visit, no medication indicated. DASH diet and importance of  A healthy weight with regular physical activity as tolerated. Sodium restriction important

## 2012-04-03 NOTE — Assessment & Plan Note (Signed)
Worsening due to ongoing noicotine use Patient counseled for approximately 5 minutes regarding the health risks of ongoing nicotine use, specifically all types of cancer, heart disease, stroke and respiratory failure. The options available for help with cessation ,the behavioral changes to assist the process, and the option to either gradully reduce usage  Or abruptly stop.is also discussed. Pt is also encouraged to set specific goals in number of cigarettes used daily, as well as to set a quit date.

## 2012-04-03 NOTE — Assessment & Plan Note (Signed)
Severe generalized osteoarthritis worse in back and knees limiting mobility

## 2012-04-03 NOTE — Assessment & Plan Note (Signed)
Ongoing use, no commitiment to set a quit date Patient counseled for approximately 5 minutes regarding the health risks of ongoing nicotine use, specifically all types of cancer, heart disease, stroke and respiratory failure. The options available for help with cessation ,the behavioral changes to assist the process, and the option to either gradully reduce usage  Or abruptly stop.is also discussed. Pt is also encouraged to set specific goals in number of cigarettes used daily, as well as to set a quit date. Needs overnight pulse ox study

## 2012-04-04 ENCOUNTER — Telehealth: Payer: Self-pay | Admitting: Orthopedic Surgery

## 2012-04-04 NOTE — Telephone Encounter (Signed)
Please call dr Phoebe Perch or primary care physician

## 2012-04-04 NOTE — Telephone Encounter (Signed)
Jennifer [name] called this morning asking for an appointment [facility] , says her back and both knees are hurting.  She is scheduled for yearly xr of knee 09/30/22,  And was referred  To Dr. Phoebe Perch for back pain and saw him  02/27/11. Please advise if to schedule her here.

## 2012-04-05 DIAGNOSIS — J449 Chronic obstructive pulmonary disease, unspecified: Secondary | ICD-10-CM | POA: Diagnosis not present

## 2012-04-05 NOTE — Telephone Encounter (Signed)
Advised patient of doctor's reply and gave her the phone # for Dr. Phoebe Perch

## 2012-04-06 DIAGNOSIS — J449 Chronic obstructive pulmonary disease, unspecified: Secondary | ICD-10-CM | POA: Diagnosis not present

## 2012-04-11 ENCOUNTER — Ambulatory Visit: Payer: Medicare Other | Admitting: Family Medicine

## 2012-05-09 DIAGNOSIS — M1710 Unilateral primary osteoarthritis, unspecified knee: Secondary | ICD-10-CM | POA: Diagnosis not present

## 2012-05-09 DIAGNOSIS — M545 Low back pain: Secondary | ICD-10-CM | POA: Diagnosis not present

## 2012-05-16 ENCOUNTER — Ambulatory Visit (INDEPENDENT_AMBULATORY_CARE_PROVIDER_SITE_OTHER): Payer: Medicare Other | Admitting: Family Medicine

## 2012-05-16 VITALS — BP 140/84 | HR 73 | Resp 16 | Wt 182.0 lb

## 2012-05-16 DIAGNOSIS — G3184 Mild cognitive impairment, so stated: Secondary | ICD-10-CM | POA: Diagnosis not present

## 2012-05-16 DIAGNOSIS — F17200 Nicotine dependence, unspecified, uncomplicated: Secondary | ICD-10-CM | POA: Diagnosis not present

## 2012-05-16 DIAGNOSIS — R030 Elevated blood-pressure reading, without diagnosis of hypertension: Secondary | ICD-10-CM

## 2012-05-16 DIAGNOSIS — J449 Chronic obstructive pulmonary disease, unspecified: Secondary | ICD-10-CM

## 2012-05-16 DIAGNOSIS — M4800 Spinal stenosis, site unspecified: Secondary | ICD-10-CM

## 2012-05-16 MED ORDER — DONEPEZIL HCL 5 MG PO TABS: 5.0000 mg | ORAL_TABLET | Freq: Every day | ORAL | Status: DC

## 2012-05-16 NOTE — Patient Instructions (Addendum)
F/u in 4 month, call if you need me before  For pain, use clelbrex one daily, tylenol one daily, on a bad day, increase to twice daily.Gabapentin one twice daily  Continue aricept one daily   You need to stop smoking , this will reduce your risk of heart disease, stroke , improve your breathing and reduce your risk of cancer  Stopping smoking will improve your health and also your daughter will be much happier  You need to cut back on sweets , cakes so you feel better

## 2012-05-26 ENCOUNTER — Emergency Department (HOSPITAL_COMMUNITY): Payer: Medicare Other

## 2012-05-26 ENCOUNTER — Emergency Department (HOSPITAL_COMMUNITY)
Admission: EM | Admit: 2012-05-26 | Discharge: 2012-05-26 | Disposition: A | Discharge status: Home / Self Care | Payer: Medicare Other | Attending: Emergency Medicine | Admitting: Emergency Medicine

## 2012-05-26 ENCOUNTER — Encounter (HOSPITAL_COMMUNITY): Payer: Self-pay

## 2012-05-26 DIAGNOSIS — Z8739 Personal history of other diseases of the musculoskeletal system and connective tissue: Secondary | ICD-10-CM | POA: Diagnosis not present

## 2012-05-26 DIAGNOSIS — K219 Gastro-esophageal reflux disease without esophagitis: Secondary | ICD-10-CM | POA: Diagnosis not present

## 2012-05-26 DIAGNOSIS — Z872 Personal history of diseases of the skin and subcutaneous tissue: Secondary | ICD-10-CM | POA: Insufficient documentation

## 2012-05-26 DIAGNOSIS — I10 Essential (primary) hypertension: Secondary | ICD-10-CM | POA: Diagnosis not present

## 2012-05-26 DIAGNOSIS — Z862 Personal history of diseases of the blood and blood-forming organs and certain disorders involving the immune mechanism: Secondary | ICD-10-CM | POA: Insufficient documentation

## 2012-05-26 DIAGNOSIS — J441 Chronic obstructive pulmonary disease with (acute) exacerbation: Secondary | ICD-10-CM | POA: Insufficient documentation

## 2012-05-26 DIAGNOSIS — Z8719 Personal history of other diseases of the digestive system: Secondary | ICD-10-CM | POA: Insufficient documentation

## 2012-05-26 DIAGNOSIS — J438 Other emphysema: Secondary | ICD-10-CM | POA: Diagnosis not present

## 2012-05-26 DIAGNOSIS — Z880 Allergy status to penicillin: Secondary | ICD-10-CM | POA: Insufficient documentation

## 2012-05-26 DIAGNOSIS — Z8709 Personal history of other diseases of the respiratory system: Secondary | ICD-10-CM | POA: Insufficient documentation

## 2012-05-26 DIAGNOSIS — F17200 Nicotine dependence, unspecified, uncomplicated: Secondary | ICD-10-CM | POA: Insufficient documentation

## 2012-05-26 DIAGNOSIS — R0789 Other chest pain: Secondary | ICD-10-CM | POA: Diagnosis not present

## 2012-05-26 DIAGNOSIS — Z8619 Personal history of other infectious and parasitic diseases: Secondary | ICD-10-CM | POA: Diagnosis not present

## 2012-05-26 DIAGNOSIS — R072 Precordial pain: Secondary | ICD-10-CM | POA: Diagnosis not present

## 2012-05-26 DIAGNOSIS — Z79899 Other long term (current) drug therapy: Secondary | ICD-10-CM | POA: Insufficient documentation

## 2012-05-26 DIAGNOSIS — E785 Hyperlipidemia, unspecified: Secondary | ICD-10-CM | POA: Diagnosis not present

## 2012-05-26 DIAGNOSIS — R079 Chest pain, unspecified: Secondary | ICD-10-CM

## 2012-05-26 LAB — CBC WITH DIFFERENTIAL/PLATELET
Basophils Relative: 0 % (ref 0–1)
Hemoglobin: 12.1 g/dL (ref 12.0–15.0)
MCHC: 31.1 g/dL (ref 30.0–36.0)
Monocytes Relative: 10 % (ref 3–12)
Neutro Abs: 1.8 10*3/uL (ref 1.7–7.7)
Neutrophils Relative %: 46 % (ref 43–77)
RBC: 4.21 MIL/uL (ref 3.87–5.11)
WBC: 4 10*3/uL (ref 4.0–10.5)

## 2012-05-26 LAB — BASIC METABOLIC PANEL
GFR calc Af Amer: 87 mL/min — ABNORMAL LOW (ref >90)
GFR calc non Af Amer: 75 mL/min — ABNORMAL LOW (ref >90)
Potassium: 3.7 mEq/L (ref 3.5–5.1)
Sodium: 141 mEq/L (ref 135–145)

## 2012-05-26 MED ORDER — IPRATROPIUM BROMIDE 0.02 % IN SOLN
0.5000 mg | Freq: Once | RESPIRATORY_TRACT | Status: AC
  Administered 2012-05-26: 0.5 mg via RESPIRATORY_TRACT
  Filled 2012-05-26: qty 2.5

## 2012-05-26 MED ORDER — ASPIRIN 325 MG PO TABS
325.0000 mg | ORAL_TABLET | Freq: Once | ORAL | Status: AC
  Administered 2012-05-26: 325 mg via ORAL
  Filled 2012-05-26: qty 1

## 2012-05-26 MED ORDER — ALBUTEROL SULFATE (5 MG/ML) 0.5% IN NEBU
5.0000 mg | INHALATION_SOLUTION | Freq: Once | RESPIRATORY_TRACT | Status: AC
  Administered 2012-05-26: 5 mg via RESPIRATORY_TRACT
  Filled 2012-05-26: qty 1

## 2012-05-26 NOTE — ED Notes (Signed)
Pt reports she had a very brief sharp pain to mid sternal chest area while at home, just prior to coming to hospital.  Pt denies pain or other complaints at this time,

## 2012-05-26 NOTE — ED Provider Notes (Signed)
History  This chart was scribed for Joya Gaskins, MD,  by Concha Se, ED Scribe. The patient was seen in room APA05/APA05 and the patient's care was started at 8:20 PM.     CSN: [id number]  Arrival date & time 05/26/12  1951   First MD Initiated Contact with Patient 05/26/12 2011      Chief Complaint  Patient presents with  . Chest Pain   Patient is a 79 y.o. female presenting with chest pain. The history is provided by the patient and a relative. No language interpreter was used.  Chest Pain Pain location:  Substernal area Pain quality: sharp   Pain quality: not shooting   Pain radiates to:  Does not radiate Pain radiates to the back: no   Pain severity:  Mild Onset quality:  Sudden Duration:  1 minute Timing:  Intermittent Progression:  Resolved Chronicity:  Recurrent Context: at rest   Relieved by:  Nothing Worsened by:  Nothing tried Ineffective treatments:  None tried Associated symptoms: no back pain, no cough, no diaphoresis and no nausea   Risk factors: hypertension and smoking    HPI Comments: Jennifer [name] is a 78 y.o. female who presents to the Emergency Department complaining of a quick sharp episode sternal chest pain that started 7:30PM while watching television at home only lasting less than 1 minute which resolved on its own. Pt states no associated radiation of the pain. No pleuritic pain reported Pt states mild wheezing but she has COPD. Pt denies coughing, vomiting,SOB, nausea, diaphoresis, or pain while breathing. Pt's  Family member states pt had prior episodes of similar chest pain and was seen in ED with diagnoses of mild heart attack several yrs ago but that episode was more severe. Pt has h/o COPD, chest pain, and hypertension. Pt states she is a current smoker.  Past Medical History  Diagnosis Date  . Chest pain 2006  . COPD (chronic obstructive pulmonary disease)   . Hypertension     ECG in 2006 mild to moderate left ventricular  hypertrophy   . Hyperlipidemia   . Osteoarthritis     status post left TKA; surgery on the right is anticipated in the near future   . Nicotine addiction   . Gastroesophageal reflux disease   . Hiatal hernia   . Anemia     iron deficiency post op.  . Chronic back pain   . Pulmonary nodules     stable since 2006  . Cellulitis of finger     right   . Shingles     right breast   . Ulcer     gential  . Skin infection     Past Surgical History  Procedure Laterality Date  . Cholecystectomy    . Combined hysterectomy abdominal w/ a&p repair / oophorectomy  approx. 40 years ago   . Mastectomy  1998 & 2007     right -1998 / left 2007  . Total knee arthroplasty  4/08    left   . Right knee replacement  09/2009    Dr. Romeo Apple    Family History  Problem Relation Age of Onset  . Cancer Mother 50    breast   . Cancer Father 36    prostate  . Hypertension Father   . Hypertension Sister   . Asthma Sister     History  Substance Use Topics  . Smoking status: Current Every Day Smoker -- 0.50 packs/day    Types: Cigarettes  .  Smokeless tobacco: Not on file  . Alcohol Use: No    OB History   Grav Para Term Preterm Abortions TAB SAB Ect Mult Living                  Review of Systems  Constitutional: Negative for diaphoresis.  Respiratory: Negative for cough.   Cardiovascular: Positive for chest pain.  Gastrointestinal: Negative for nausea.  Musculoskeletal: Negative for back pain.   A complete 10 system review of systems was obtained and all systems are negative except as noted in the HPI and PMH.   Allergies  Iohexol; Iron; and Penicillins  Home Medications   Current Outpatient Rx  Name  Route  Sig  Dispense  Refill  . albuterol-ipratropium (COMBIVENT) 18-103 MCG/ACT inhaler   Inhalation   Inhale 2 puffs into the lungs every 6 (six) hours as needed for shortness of breath.         . celecoxib (CELEBREX) 200 MG capsule   Oral   Take 200 mg by mouth 2 (two)  times daily.         Marland Kitchen donepezil (ARICEPT) 5 MG tablet   Oral   Take 1 tablet (5 mg total) by mouth at bedtime.   30 tablet   5     Reduced dose effective 03/30/2012  Discontinue a ...   . gabapentin (NEURONTIN) 100 MG capsule   Oral   Take 100 mg by mouth 2 (two) times daily.    60 capsule   2   . lidocaine (LIDODERM) 5 %   Transdermal   Place 3 patches onto the skin every 12 (twelve) hours. Remove & Discard patch within 12 hours or as directed by MD   90 patch   2     BP 168/84  Pulse 69  Temp(Src) 98.4 F (36.9 C) (Oral)  Resp 18  Ht 5\' 5"  (1.651 m)  Wt 180 lb (81.647 kg)  BMI 29.95 kg/m2  SpO2 100% BP 147/77  Pulse 70  Temp(Src) 98.4 F (36.9 C) (Oral)  Resp 16  Ht 5\' 5"  (1.651 m)  Wt 180 lb (81.647 kg)  BMI 29.95 kg/m2  SpO2 97%   Physical Exam CONSTITUTIONAL: Well developed/well nourished HEAD: Normocephalic/atraumatic EYES: EOMI/PERRL ENMT: Mucous membranes moist NECK: supple no meningeal signs SPINE:entire spine nontender CV: S1/S2 noted, no murmurs/rubs/gallops noted LUNGS:  bilateral wheezing but no rales and no distress noted ABDOMEN: soft, nontender, no rebound or guarding GU:no cva tenderness NEURO: Pt is awake/alert, moves all extremitiesx4 EXTREMITIES: pulses normal, full ROM, no calf tenderness and no LE edema SKIN: warm, color normal PSYCH: no abnormalities of mood noted  ED Course  Procedures  DIAGNOSTIC STUDIES: Oxygen Saturation is 100% on room air, normal by my interpretation.    COORDINATION OF CARE: 8:24 PM Discussed ED treatment with pt and pt agrees to treatment plan Pt had one brief episode of CP at 1930 No associated symptoms Now feels well.  She does have wheeze but has h/o COPD Will treat wheezing.  Will check troponin at 2230 Pt agreeable   After observation in the ED, pt was ready to go home.  She did agree to have labs drawn (although less than 3 hrs since last episode of CP before arrival to ED) Troponin  negative She had no recurrent CP I doubt PE or dissection.  ACS is less likely.  Stable for d/c   Labs Reviewed  BASIC METABOLIC PANEL - Abnormal; Notable for the following:  GFR calc non Af Amer 75 (*)    GFR calc Af Amer 87 (*)    All other components within normal limits  CBC WITH DIFFERENTIAL  TROPONIN I   Dg Chest Port 1 View  05/26/2012   *RADIOLOGY REPORT*  Clinical Data: Chest pain.  COPD.  PORTABLE CHEST - 1 VIEW  Comparison: PA and lateral chest 06/25/2011.  Findings: The chest is hyperexpanded with distortion of the pulmonary architecture.  No consolidative process, pneumothorax or effusion.  Heart size normal. Surgical clips right axilla noted.  IMPRESSION: Emphysema without acute disease.   Original Report Authenticated By: Holley Dexter, M.D.      MDM  Nursing notes including past medical history and social history reviewed and considered in documentation xrays reviewed and considered      Date: 05/26/2012  Rate: 79  Rhythm: normal sinus rhythm  QRS Axis: normal  Intervals: normal  ST/T Wave abnormalities: nonspecific ST changes  Conduction Disutrbances:none  Narrative Interpretation: PVC noted  Old EKG Reviewed: unchanged    I personally performed the services described in this documentation, which was scribed in my presence. The recorded information has been reviewed and is accurate.      Joya Gaskins, MD 05/26/12 [phone]

## 2012-05-30 ENCOUNTER — Telehealth: Payer: Self-pay | Admitting: Family Medicine

## 2012-05-31 ENCOUNTER — Emergency Department (HOSPITAL_COMMUNITY)
Admission: EM | Admit: 2012-05-31 | Discharge: 2012-05-31 | Disposition: A | Discharge status: Home / Self Care | Payer: Medicare Other | Attending: Emergency Medicine | Admitting: Emergency Medicine

## 2012-05-31 ENCOUNTER — Other Ambulatory Visit: Payer: Self-pay | Admitting: Family Medicine

## 2012-05-31 ENCOUNTER — Emergency Department (HOSPITAL_COMMUNITY): Payer: Medicare Other

## 2012-05-31 ENCOUNTER — Encounter (HOSPITAL_COMMUNITY): Payer: Self-pay | Admitting: *Deleted

## 2012-05-31 DIAGNOSIS — Z880 Allergy status to penicillin: Secondary | ICD-10-CM | POA: Diagnosis not present

## 2012-05-31 DIAGNOSIS — Z8739 Personal history of other diseases of the musculoskeletal system and connective tissue: Secondary | ICD-10-CM | POA: Diagnosis not present

## 2012-05-31 DIAGNOSIS — F17200 Nicotine dependence, unspecified, uncomplicated: Secondary | ICD-10-CM | POA: Diagnosis not present

## 2012-05-31 DIAGNOSIS — Z862 Personal history of diseases of the blood and blood-forming organs and certain disorders involving the immune mechanism: Secondary | ICD-10-CM | POA: Insufficient documentation

## 2012-05-31 DIAGNOSIS — J449 Chronic obstructive pulmonary disease, unspecified: Secondary | ICD-10-CM | POA: Diagnosis not present

## 2012-05-31 DIAGNOSIS — Z9889 Other specified postprocedural states: Secondary | ICD-10-CM | POA: Diagnosis not present

## 2012-05-31 DIAGNOSIS — Z8709 Personal history of other diseases of the respiratory system: Secondary | ICD-10-CM | POA: Diagnosis not present

## 2012-05-31 DIAGNOSIS — Z872 Personal history of diseases of the skin and subcutaneous tissue: Secondary | ICD-10-CM | POA: Insufficient documentation

## 2012-05-31 DIAGNOSIS — J4489 Other specified chronic obstructive pulmonary disease: Secondary | ICD-10-CM | POA: Insufficient documentation

## 2012-05-31 DIAGNOSIS — M545 Low back pain, unspecified: Secondary | ICD-10-CM | POA: Insufficient documentation

## 2012-05-31 DIAGNOSIS — G8929 Other chronic pain: Secondary | ICD-10-CM | POA: Diagnosis not present

## 2012-05-31 DIAGNOSIS — M5137 Other intervertebral disc degeneration, lumbosacral region: Secondary | ICD-10-CM | POA: Diagnosis not present

## 2012-05-31 DIAGNOSIS — M549 Dorsalgia, unspecified: Secondary | ICD-10-CM

## 2012-05-31 DIAGNOSIS — M25569 Pain in unspecified knee: Secondary | ICD-10-CM | POA: Diagnosis not present

## 2012-05-31 DIAGNOSIS — Z89529 Acquired absence of unspecified knee: Secondary | ICD-10-CM | POA: Diagnosis not present

## 2012-05-31 DIAGNOSIS — Z8719 Personal history of other diseases of the digestive system: Secondary | ICD-10-CM | POA: Diagnosis not present

## 2012-05-31 DIAGNOSIS — Z79899 Other long term (current) drug therapy: Secondary | ICD-10-CM | POA: Diagnosis not present

## 2012-05-31 DIAGNOSIS — M47817 Spondylosis without myelopathy or radiculopathy, lumbosacral region: Secondary | ICD-10-CM | POA: Diagnosis not present

## 2012-05-31 DIAGNOSIS — Z8639 Personal history of other endocrine, nutritional and metabolic disease: Secondary | ICD-10-CM | POA: Insufficient documentation

## 2012-05-31 DIAGNOSIS — Z8619 Personal history of other infectious and parasitic diseases: Secondary | ICD-10-CM | POA: Insufficient documentation

## 2012-05-31 MED ORDER — OXYCODONE-ACETAMINOPHEN 5-325 MG PO TABS
1.0000 | ORAL_TABLET | Freq: Once | ORAL | Status: AC
  Administered 2012-05-31: 1 via ORAL
  Filled 2012-05-31: qty 1

## 2012-05-31 MED ORDER — TRAMADOL HCL 50 MG PO TABS: ORAL_TABLET | ORAL | Status: DC

## 2012-05-31 NOTE — ED Provider Notes (Signed)
History  This chart was scribed for Benny Lennert, MD by Bennett Scrape, ED Scribe. This patient was seen in room APA19/APA19 and the patient's care was started at 4:55 PM.  CSN: [id number]  Arrival date & time 05/31/12  1641   First MD Initiated Contact with Patient 05/31/12 1655      Chief Complaint  Patient presents with  . Back Pain     Patient is a 79 y.o. female presenting with back pain. The history is provided by the patient. No language interpreter was used.  Back Pain Location:  Lumbar spine Quality:  Aching Onset quality:  Gradual Timing:  Constant Progression:  Worsening Chronicity:  Chronic Context: not falling   Relieved by:  Nothing Worsened by:  Movement Ineffective treatments:  Narcotics Associated symptoms: no abdominal pain, no bladder incontinence, no bowel incontinence, no chest pain, no fever, no headaches, no tingling and no weakness     HPI Comments: Jennifer [name] is a 79 y.o. female who presents to the Emergency Department complaining of worsening of chronic lower back pain that radiates up to her neck and described as sore with associated bilateral knee pain. She reports that she has prior knee surgeries on both knees. She denies receiving pain injections into her knees before. She reports taking one dose of 50 mg Ultram that was called in by her PCP today with no improvement. She denies weakness, numbness and bowel or bladder incontinence. She has a h/o CP, COPD and HLD. Pt is a current everyday smoker but denies alcohol use.  PCP is Dr. Marden Noble is Dr. Romeo Apple   Past Medical History  Diagnosis Date  . Chest pain 2006  . COPD (chronic obstructive pulmonary disease)   . Hyperlipidemia   . Osteoarthritis     status post left TKA; surgery on the right is anticipated in the near future   . Nicotine addiction   . Gastroesophageal reflux disease   . Hiatal hernia   . Anemia     iron deficiency post op.  . Chronic back pain   . Pulmonary  nodules     stable since 2006  . Cellulitis of finger     right   . Shingles     right breast   . Ulcer     gential  . Skin infection     Past Surgical History  Procedure Laterality Date  . Cholecystectomy    . Combined hysterectomy abdominal w/ a&p repair / oophorectomy  approx. 40 years ago   . Mastectomy  1998 & 2007     right -1998 / left 2007  . Total knee arthroplasty  4/08    left   . Right knee replacement  09/2009    Dr. Romeo Apple  . Abdominal hysterectomy      Family History  Problem Relation Age of Onset  . Cancer Mother 50    breast   . Cancer Father 26    prostate  . Hypertension Father   . Hypertension Sister   . Asthma Sister     History  Substance Use Topics  . Smoking status: Current Every Day Smoker -- 0.50 packs/day    Types: Cigarettes  . Smokeless tobacco: Not on file  . Alcohol Use: No    No OB history provided.  Review of Systems  Constitutional: Negative for fever, appetite change and fatigue.  HENT: Negative for congestion, sinus pressure and ear discharge.   Eyes: Negative for discharge.  Respiratory: Negative for  cough.   Cardiovascular: Negative for chest pain.  Gastrointestinal: Negative for abdominal pain, diarrhea and bowel incontinence.  Genitourinary: Negative for bladder incontinence, frequency and hematuria.  Musculoskeletal: Positive for back pain and arthralgias.  Skin: Negative for rash.  Neurological: Negative for tingling, seizures, weakness and headaches.  Psychiatric/Behavioral: Negative for hallucinations.    Allergies  Iohexol; Iron; and Penicillins  Home Medications   Current Outpatient Rx  Name  Route  Sig  Dispense  Refill  . albuterol-ipratropium (COMBIVENT) 18-103 MCG/ACT inhaler   Inhalation   Inhale 2 puffs into the lungs every 6 (six) hours as needed for shortness of breath.         . celecoxib (CELEBREX) 200 MG capsule   Oral   Take 200 mg by mouth 2 (two) times daily.         Marland Kitchen donepezil  (ARICEPT) 5 MG tablet   Oral   Take 1 tablet (5 mg total) by mouth at bedtime.   30 tablet   5     Reduced dose effective 03/30/2012  Discontinue a ...   . gabapentin (NEURONTIN) 100 MG capsule   Oral   Take 100 mg by mouth 2 (two) times daily.    60 capsule   2   . lidocaine (LIDODERM) 5 %   Transdermal   Place 3 patches onto the skin every 12 (twelve) hours. Remove & Discard patch within 12 hours or as directed by MD   90 patch   2   . traMADol (ULTRAM) 50 MG tablet      One tablet once daily, as needed, for pain   30 tablet   1     Triage Vitals: BP 145/96  Pulse 80  Temp(Src) 97.9 F (36.6 C) (Oral)  Resp 19  Wt 184 lb (83.462 kg)  BMI 30.62 kg/m2  SpO2 97%  Physical Exam  Nursing note and vitals reviewed. Constitutional: She is oriented to person, place, and time. She appears well-developed and well-nourished.  HENT:  Head: Normocephalic and atraumatic.  Eyes: Conjunctivae and EOM are normal. No scleral icterus.  Neck: Neck supple. No thyromegaly present.  Cardiovascular: Normal rate and regular rhythm.  Exam reveals no gallop and no friction rub.   No murmur heard. Pulmonary/Chest: Effort normal. No stridor. She has no wheezes. She has no rales. She exhibits no tenderness.  Abdominal: She exhibits no distension. There is no tenderness. There is no rebound.  Musculoskeletal: Normal range of motion. She exhibits no edema.  Bilateral knees are mildly tender, old scars on bilateral knees, mild tenderness to lumbar spine  Lymphadenopathy:    She has no cervical adenopathy.  Neurological: She is alert and oriented to person, place, and time. Coordination normal.  Skin: No rash noted. No erythema.  Psychiatric: She has a normal mood and affect. Her behavior is normal.    ED Course  Procedures (including critical care time)  Medications  oxyCODONE-acetaminophen (PERCOCET/ROXICET) 5-325 MG per tablet 1 tablet (not administered)    DIAGNOSTIC  STUDIES: Oxygen Saturation is 97% on room air, normal by my interpretation.    COORDINATION OF CARE: 5:03 PM-Discussed treatment plan which includes oxycodone and lumbar x-ray with pt at bedside and pt agreed to plan.   Labs Reviewed - No data to display No results found.   No diagnosis found.    MDM        The chart was scribed for me under my direct supervision.  I personally performed the history, physical, and  medical decision making and all procedures in the evaluation of this patient.Benny Lennert, MD 05/31/12 Rickey Primus

## 2012-05-31 NOTE — ED Notes (Signed)
Back pain and bil knee pain . Light headed at times

## 2012-05-31 NOTE — ED Notes (Signed)
Pt c/o pain in low back radiating up spine and pain in knees for " a long time."  Denies injury, says pain has been more severe over the past couple of weeks.

## 2012-05-31 NOTE — Telephone Encounter (Signed)
Spoke withdaughter and have added one tramadol once daily, as needed

## 2012-06-12 ENCOUNTER — Encounter: Payer: Self-pay | Admitting: Family Medicine

## 2012-06-12 NOTE — Assessment & Plan Note (Signed)
C/o chronic disanl;ing uncontrolled pain, medication management reviewed with ehr daughter who accompanies her

## 2012-06-12 NOTE — Assessment & Plan Note (Signed)
Pt to continue aricept, daughter decided against therapeutic dose , felt that S/E were undesirable

## 2012-06-12 NOTE — Assessment & Plan Note (Signed)
Progressively worsening due to ongoing nicotine use which pt is unwilling to quit to her daughter's distress and frustration

## 2012-06-12 NOTE — Assessment & Plan Note (Signed)
Within acceptable range based on pt age, no medication. Low sodium diet rich in fruit and vegetable advised

## 2012-06-12 NOTE — Assessment & Plan Note (Signed)
Unchanged. Patient counseled for approximately 5 minutes regarding the health risks of ongoing nicotine use, specifically all types of cancer, heart disease, stroke and respiratory failure. The options available for help with cessation ,the behavioral changes to assist the process, and the option to either gradully reduce usage  Or abruptly stop.is also discussed. Pt is also encouraged to set specific goals in number of cigarettes used daily, as well as to set a quit date.  

## 2012-06-12 NOTE — Progress Notes (Signed)
  Subjective:    Patient ID: Jennifer [name], female    DOB: 1922/03/17, 79 y.o.   MRN: [id number]  HPI The PT is here for follow up and re-evaluation of chronic medical conditions, medication management and review of any available recent lab and radiology data.  Preventive health is updated, specifically   Immunization.   . The PT denies any adverse reactions to current medications since the last visit.  There are no new concerns.  Chronic c/o uncontrolled and disabling pain, no h/o falls      Review of Systems See HPI Denies recent fever or chills. Denies sinus pressure, nasal congestion, ear pain or sore throat. .Intermittent wheeze with increasing shortness of breath with activity, ongoing nicotine use Denies chest pains, palpitations and leg swelling Denies abdominal pain, nausea, vomiting,diarrhea or constipation.   Denies dysuria, frequency, hesitancy or incontinence.  Denies headaches, seizures, numbness, or tingling. Denies depression, anxiety or insomnia. Denies skin break down or rash.        Objective:   Physical Exam  Patient alert and oriented and in no cardiopulmonary distress.  HEENT: No facial asymmetry, EOMI, no sinus tenderness,  oropharynx pink and moist.  Neck supple no adenopathy.  Chest: Clear to auscultation bilaterally.Decreased air entry throughout  CVS: S1, S2 no murmurs, no S3.  ABD: Soft non tender. Bowel sounds normal.  Ext: No edema  MS: Decreased  ROM spine, shoulders, hips and knees.  Skin: Intact, no ulcerations or rash noted.  Psych: Good eye contact, normal affect. Memory intact not anxious or depressed appearing.  CNS: CN 2-12 intact, power, normal throughout.       Assessment & Plan:

## 2012-07-28 ENCOUNTER — Encounter: Payer: Self-pay | Admitting: Family Medicine

## 2012-07-28 ENCOUNTER — Ambulatory Visit (INDEPENDENT_AMBULATORY_CARE_PROVIDER_SITE_OTHER): Payer: Medicare Other | Admitting: Family Medicine

## 2012-07-28 VITALS — BP 130/78 | HR 93 | Resp 16 | Ht 65.0 in | Wt 182.0 lb

## 2012-07-28 DIAGNOSIS — F17200 Nicotine dependence, unspecified, uncomplicated: Secondary | ICD-10-CM

## 2012-07-28 DIAGNOSIS — R030 Elevated blood-pressure reading, without diagnosis of hypertension: Secondary | ICD-10-CM

## 2012-07-28 DIAGNOSIS — J449 Chronic obstructive pulmonary disease, unspecified: Secondary | ICD-10-CM

## 2012-07-28 DIAGNOSIS — M4800 Spinal stenosis, site unspecified: Secondary | ICD-10-CM

## 2012-07-28 DIAGNOSIS — G3184 Mild cognitive impairment, so stated: Secondary | ICD-10-CM | POA: Diagnosis not present

## 2012-07-28 DIAGNOSIS — Z9181 History of falling: Secondary | ICD-10-CM

## 2012-07-28 DIAGNOSIS — M549 Dorsalgia, unspecified: Secondary | ICD-10-CM | POA: Diagnosis not present

## 2012-07-28 MED ORDER — CELECOXIB 200 MG PO CAPS: 200.0000 mg | ORAL_CAPSULE | Freq: Every day | ORAL | Status: DC

## 2012-07-28 MED ORDER — GABAPENTIN 100 MG PO CAPS: 100.0000 mg | ORAL_CAPSULE | Freq: Three times a day (TID) | ORAL | Status: DC

## 2012-07-28 NOTE — Patient Instructions (Signed)
F/u in 3 months, call if you need me before  Continue to take medication as you have been

## 2012-07-30 DIAGNOSIS — Z9181 History of falling: Secondary | ICD-10-CM | POA: Insufficient documentation

## 2012-07-30 NOTE — Assessment & Plan Note (Signed)
Unchanged, and unwilling to quit at this time Patient counseled for approximately 5 minutes regarding the health risks of ongoing nicotine use, specifically all types of cancer, heart disease, stroke and respiratory failure. The options available for help with cessation ,the behavioral changes to assist the process, and the option to either gradully reduce usage  Or abruptly stop.is also discussed. Pt is also encouraged to set specific goals in number of cigarettes used daily, as well as to set a quit date.

## 2012-07-30 NOTE — Assessment & Plan Note (Signed)
Safety inside and outside the home stressed. Pt is compliant with use of cane, no falls to date

## 2012-07-30 NOTE — Assessment & Plan Note (Signed)
Adequate control on current regime , o change in same

## 2012-07-30 NOTE — Assessment & Plan Note (Signed)
Worsening with ongoing nicotine

## 2012-07-30 NOTE — Assessment & Plan Note (Signed)
Continue low dose aricept

## 2012-07-30 NOTE — Assessment & Plan Note (Signed)
Improved, no med will be prescribed per new guidelines a higher pressure is acceptable

## 2012-07-30 NOTE — Progress Notes (Signed)
  Subjective:    Patient ID: Jennifer [name], female    DOB: 11-27-1922, 79 y.o.   MRN: [id number]  HPI The PT is here for follow up and re-evaluation of chronic medical conditions, medication management and review of any available recent lab and radiology data.  Preventive health is updated, specifically   Immunization.   The PT denies any adverse reactions to current medications since the last visit.  There are no new concerns.  Ongoing c/o chronic back and knee pain, however daughter who cares for her, states that her need for tramadol is sparing, she is maintained on celebrex, tylenol and lidoderm patches . sleep is not disturbed by pain, she has had no falls Daughter intersted in sitter or respite  Help to give her a break, otherwise thing seem a little better at this visit    Review of Systems See HPI Denies recent fever or chills. Denies sinus pressure, nasal congestion, ear pain or sore throat. Denies chest congestion, productive cough or wheezing.Has chronic smokers cough with exercise intolerance Denies chest pains, palpitations and leg swelling Denies abdominal pain, nausea, vomiting,diarrhea or constipation.   Denies dysuria, frequency, hesitancy or incontinence.  Denies headaches, seizures, numbness, or tingling. Denies depression, anxiety or insomnia. Denies skin break down or rash.        Objective:   Physical Exam  Patient alert and oriented and in no cardiopulmonary distress.  HEENT: No facial asymmetry, EOMI, no sinus tenderness,  oropharynx pink and moist.  Neck supple no adenopathy.  Chest: Clear to auscultation bilaterally.Decraesed air entry, scattered crackles no wheeze  CVS: S1, S2 no murmurs, no S3.  ABD: Soft non tender. Bowel sounds normal.  Ext: No edema  MS: decreased  ROM spine, shoulders, hips and knees.  Skin: Intact, no ulcerations or rash noted.  Psych: Good eye contact, normal affect. Memory impaired not anxious or depressed  appearing. MMSE score is 23/30 , unchanged from 1  Year ago  CNS: CN 2-12 intact, power, tone and sensation normal throughout.       Assessment & Plan:

## 2012-08-16 ENCOUNTER — Ambulatory Visit: Payer: Medicare Other | Admitting: Family Medicine

## 2012-08-19 ENCOUNTER — Telehealth: Payer: Self-pay | Admitting: Family Medicine

## 2012-08-19 ENCOUNTER — Other Ambulatory Visit: Payer: Self-pay

## 2012-08-19 ENCOUNTER — Other Ambulatory Visit: Payer: Self-pay | Admitting: Family Medicine

## 2012-08-19 MED ORDER — GABAPENTIN 300 MG PO CAPS: 300.0000 mg | ORAL_CAPSULE | Freq: Two times a day (BID) | ORAL | Status: DC

## 2012-08-19 NOTE — Telephone Encounter (Signed)
I do not believe that the 90 pills prescribed on 7/21 have actually been collected and all taken, pls see if this can be verified. If this is the case, pls change dose to 300mg  twice daily effective today, and explain to patient and daughter that ABSOLUTELY NEEDS to take med as prescribed, unsafe to change dosing without medical consultation  I am entering the higher dose for you to send in if this needs to be done, thanks

## 2012-08-19 NOTE — Telephone Encounter (Signed)
Script was collected on 7/21. Daughter aware that per Dr Lodema Hong- a dose increase- was sent in and to make sure they are taking as directed

## 2012-08-19 NOTE — Telephone Encounter (Signed)
Spoke with patient's daughter and she stated that there are no more pills left in the bottle.  She would like to know should be done.  Can they have any early fill or can the rx be changed?

## 2012-08-19 NOTE — Telephone Encounter (Signed)
pls send f/u on this

## 2012-08-24 DIAGNOSIS — H02839 Dermatochalasis of unspecified eye, unspecified eyelid: Secondary | ICD-10-CM | POA: Diagnosis not present

## 2012-08-24 DIAGNOSIS — H2589 Other age-related cataract: Secondary | ICD-10-CM | POA: Diagnosis not present

## 2012-08-24 DIAGNOSIS — H5200 Hypermetropia, unspecified eye: Secondary | ICD-10-CM | POA: Diagnosis not present

## 2012-08-24 DIAGNOSIS — H40019 Open angle with borderline findings, low risk, unspecified eye: Secondary | ICD-10-CM | POA: Diagnosis not present

## 2012-09-09 ENCOUNTER — Other Ambulatory Visit: Payer: Self-pay

## 2012-09-09 DIAGNOSIS — M4800 Spinal stenosis, site unspecified: Secondary | ICD-10-CM

## 2012-09-09 MED ORDER — CELECOXIB 200 MG PO CAPS: 200.0000 mg | ORAL_CAPSULE | Freq: Every day | ORAL | Status: DC

## 2012-09-29 ENCOUNTER — Ambulatory Visit: Payer: Medicare Other | Admitting: Orthopedic Surgery

## 2012-10-06 DIAGNOSIS — Z23 Encounter for immunization: Secondary | ICD-10-CM | POA: Diagnosis not present

## 2012-10-27 ENCOUNTER — Other Ambulatory Visit: Payer: Self-pay | Admitting: Family Medicine

## 2012-11-22 ENCOUNTER — Telehealth: Payer: Self-pay

## 2012-11-22 NOTE — Telephone Encounter (Signed)
Jahari coming in NOV 24. Want her to have any labs beforehand or order them at visit?

## 2012-11-22 NOTE — Telephone Encounter (Signed)
At time of visit is fine

## 2012-11-22 NOTE — Telephone Encounter (Signed)
Message left for patient

## 2012-12-05 ENCOUNTER — Encounter: Payer: Self-pay | Admitting: Family Medicine

## 2012-12-05 ENCOUNTER — Ambulatory Visit (INDEPENDENT_AMBULATORY_CARE_PROVIDER_SITE_OTHER): Payer: Medicare Other | Admitting: Family Medicine

## 2012-12-05 ENCOUNTER — Encounter (INDEPENDENT_AMBULATORY_CARE_PROVIDER_SITE_OTHER): Payer: Self-pay

## 2012-12-05 ENCOUNTER — Other Ambulatory Visit: Payer: Self-pay | Admitting: Family Medicine

## 2012-12-05 VITALS — BP 124/80 | HR 82 | Resp 16 | Ht 65.0 in | Wt 193.0 lb

## 2012-12-05 DIAGNOSIS — Z13820 Encounter for screening for osteoporosis: Secondary | ICD-10-CM

## 2012-12-05 DIAGNOSIS — N390 Urinary tract infection, site not specified: Secondary | ICD-10-CM

## 2012-12-05 DIAGNOSIS — R5381 Other malaise: Secondary | ICD-10-CM

## 2012-12-05 DIAGNOSIS — M810 Age-related osteoporosis without current pathological fracture: Secondary | ICD-10-CM

## 2012-12-05 DIAGNOSIS — J449 Chronic obstructive pulmonary disease, unspecified: Secondary | ICD-10-CM

## 2012-12-05 DIAGNOSIS — M899 Disorder of bone, unspecified: Secondary | ICD-10-CM

## 2012-12-05 DIAGNOSIS — F17200 Nicotine dependence, unspecified, uncomplicated: Secondary | ICD-10-CM | POA: Diagnosis not present

## 2012-12-05 DIAGNOSIS — G3184 Mild cognitive impairment, so stated: Secondary | ICD-10-CM

## 2012-12-05 DIAGNOSIS — M1990 Unspecified osteoarthritis, unspecified site: Secondary | ICD-10-CM

## 2012-12-05 LAB — POCT URINALYSIS DIPSTICK
Glucose, UA: NEGATIVE
Leukocytes, UA: NEGATIVE
Nitrite, UA: NEGATIVE

## 2012-12-05 MED ORDER — ASPIRIN EC 81 MG PO TBEC: 81.0000 mg | DELAYED_RELEASE_TABLET | Freq: Every day | ORAL | Status: DC

## 2012-12-05 MED ORDER — CALCIUM CARBONATE-VITAMIN D 500-200 MG-UNIT PO TABS: 1.0000 | ORAL_TABLET | Freq: Two times a day (BID) | ORAL | Status: DC

## 2012-12-05 NOTE — Progress Notes (Signed)
  Subjective:    Patient ID: Jennifer [name], female    DOB: October 14, 1922, 79 y.o.   MRN: [id number]  HPI  Pt in with her grandson who has been staying with her for the past approx 2 to 3 month, helping to care for her, she no longer is left alone, her daughter , his Mother has her stay with her in [place] also. Jennifer [name] only c/o generalized arthritis pain, she has had no falls, denies depression . Her appetite is fair, bowel movements are regualar and she denies urinary symptoms. Her grandson is concerned about her poor memory, also ongoing nicotine use , and at times poor orla intake . States that 3 days ago, she had a very high temperature, lay around all day with poor intake and reduced energy. Denies any cough or head congestion. States with only symptomatic OTC meds, the next day she was fine  Review of Systems See HPI Denies recent fever or chills. Denies sinus pressure, nasal congestion, ear pain or sore throat. Chronic  cough and  Wheezing.with shortness of breath and fatigue which is worsening with age and ongoing nicotine use Denies chest pains, palpitations and leg swelling Denies abdominal pain, nausea, vomiting,diarrhea or constipation.   Denies dysuria, frequency, hesitancy or incontinence.  Denies headaches, seizures, numbness, or tingling. Denies depression, anxiety or insomnia. Denies skin break down or rash.        Objective:   Physical Exam  Patient alert and oriented and in no cardiopulmonary distress at rest, ambulates with a cane, poor exercise tolerane  HEENT: No facial asymmetry, EOMI, no sinus tenderness,  oropharynx pink and moist.  Neck decreased ROM no adenopathy.  Chest: Clear to auscultation bilaterally.Markedly reduced air entry throughout  CVS: S1, S2 no murmurs, no S3.  ABD: Soft non tender. Bowel sounds normal.  Ext: No edema  MS: decreased  ROM spine, shoulders, hips and knees.  Skin: Intact, no ulcerations or rash noted.  Psych:  Good eye contact, normal affect. Memory impaired not anxious or depressed appearing.  CNS: CN 2-12 intact, power,  normal throughout.       Assessment & Plan:

## 2012-12-05 NOTE — Patient Instructions (Addendum)
F/u in 4 months, call if you need me before  Please discontinue smoking in the house.  You need to take aspirin 81mg  one daily to reduce stroke risk  Start calcium with D 500mg  one twice daily, both are sent to your pharmacy for bone health  Urine is being tested today for infection due to recent fever , and also you need blood work today cbc, chem 7 and vit D  Please stop at front for appt for bone density test, you will have this this week

## 2012-12-06 ENCOUNTER — Ambulatory Visit (HOSPITAL_COMMUNITY)
Admission: RE | Admit: 2012-12-06 | Discharge: 2012-12-06 | Disposition: A | Discharge status: Home / Self Care | Payer: Medicare Other | Source: Ambulatory Visit | Attending: Family Medicine | Admitting: Family Medicine

## 2012-12-06 ENCOUNTER — Other Ambulatory Visit: Payer: Self-pay | Admitting: Family Medicine

## 2012-12-06 DIAGNOSIS — Z780 Asymptomatic menopausal state: Secondary | ICD-10-CM | POA: Diagnosis not present

## 2012-12-06 DIAGNOSIS — M810 Age-related osteoporosis without current pathological fracture: Secondary | ICD-10-CM | POA: Diagnosis not present

## 2012-12-06 DIAGNOSIS — Z13820 Encounter for screening for osteoporosis: Secondary | ICD-10-CM

## 2012-12-07 LAB — URINE CULTURE: Organism ID, Bacteria: NO GROWTH

## 2012-12-10 DIAGNOSIS — N390 Urinary tract infection, site not specified: Secondary | ICD-10-CM | POA: Insufficient documentation

## 2012-12-10 NOTE — Assessment & Plan Note (Signed)
Worsening as nicotine use remains ongoing. Importance of smoking cessation stressed, pt in denial and not committed to this

## 2012-12-10 NOTE — Assessment & Plan Note (Signed)
Recent fever with reduced appetite and energy, increased urinary frequency noted also. Ua in office non convincing will f/u c/s. Basic labs also requested, cbc, chem 7

## 2012-12-10 NOTE — Assessment & Plan Note (Signed)
Needs to commit to daily calcium with D and bisphosphonate , message to be relayed after visit when report of dexa available

## 2012-12-10 NOTE — Assessment & Plan Note (Signed)
Unchanged, no interest in quitting, family members continue to encourage her to quit Patient counseled for approximately 5 minutes regarding the health risks of ongoing nicotine use, specifically all types of cancer, heart disease, stroke and respiratory failure. The options available for help with cessation ,the behavioral changes to assist the process, and the option to either gradully reduce usage  Or abruptly stop.is also discussed. Pt is also encouraged to set specific goals in number of cigarettes used daily, as well as to set a quit date.

## 2012-12-10 NOTE — Assessment & Plan Note (Addendum)
Chronic pain in most large joints. Depends on assistance from a cane for safe ambulation, has had no falls in the past 12 month No change in pani management, grandson who is staying with her in at the visit, and her daughter present via telephone from [place] when needed

## 2012-12-10 NOTE — Assessment & Plan Note (Signed)
Grandson concerned about memory loss, daughter adamant that no interest in increasing aricept to treating dose, no change made in med at this visit

## 2012-12-14 ENCOUNTER — Other Ambulatory Visit: Payer: Self-pay

## 2012-12-14 MED ORDER — ALENDRONATE SODIUM 70 MG PO TABS: 70.0000 mg | ORAL_TABLET | ORAL | Status: DC

## 2012-12-14 MED ORDER — CALCIUM CARBONATE-VITAMIN D 500-200 MG-UNIT PO TABS: 1.0000 | ORAL_TABLET | Freq: Two times a day (BID) | ORAL | Status: DC

## 2013-01-01 DIAGNOSIS — S52509A Unspecified fracture of the lower end of unspecified radius, initial encounter for closed fracture: Secondary | ICD-10-CM | POA: Diagnosis not present

## 2013-01-01 DIAGNOSIS — Z043 Encounter for examination and observation following other accident: Secondary | ICD-10-CM | POA: Diagnosis not present

## 2013-01-01 DIAGNOSIS — R209 Unspecified disturbances of skin sensation: Secondary | ICD-10-CM | POA: Diagnosis not present

## 2013-01-01 DIAGNOSIS — S0990XA Unspecified injury of head, initial encounter: Secondary | ICD-10-CM | POA: Diagnosis not present

## 2013-01-01 DIAGNOSIS — S62109A Fracture of unspecified carpal bone, unspecified wrist, initial encounter for closed fracture: Secondary | ICD-10-CM | POA: Diagnosis not present

## 2013-01-01 DIAGNOSIS — R062 Wheezing: Secondary | ICD-10-CM | POA: Diagnosis not present

## 2013-01-01 DIAGNOSIS — J449 Chronic obstructive pulmonary disease, unspecified: Secondary | ICD-10-CM | POA: Diagnosis not present

## 2013-01-02 DIAGNOSIS — S52539A Colles' fracture of unspecified radius, initial encounter for closed fracture: Secondary | ICD-10-CM | POA: Diagnosis not present

## 2013-01-10 ENCOUNTER — Other Ambulatory Visit: Payer: Self-pay | Admitting: Family Medicine

## 2013-01-10 ENCOUNTER — Telehealth: Payer: Self-pay | Admitting: Family Medicine

## 2013-01-10 NOTE — Telephone Encounter (Signed)
meds refilled 

## 2013-01-16 DIAGNOSIS — S62309A Unspecified fracture of unspecified metacarpal bone, initial encounter for closed fracture: Secondary | ICD-10-CM | POA: Diagnosis not present

## 2013-01-16 DIAGNOSIS — S52539A Colles' fracture of unspecified radius, initial encounter for closed fracture: Secondary | ICD-10-CM | POA: Diagnosis not present

## 2013-01-20 ENCOUNTER — Encounter (HOSPITAL_COMMUNITY): Payer: Self-pay | Admitting: Emergency Medicine

## 2013-01-20 ENCOUNTER — Emergency Department (HOSPITAL_COMMUNITY): Payer: Medicare Other

## 2013-01-20 ENCOUNTER — Emergency Department (HOSPITAL_COMMUNITY)
Admission: EM | Admit: 2013-01-20 | Discharge: 2013-01-20 | Disposition: A | Discharge status: Home / Self Care | Payer: Medicare Other | Attending: Emergency Medicine | Admitting: Emergency Medicine

## 2013-01-20 DIAGNOSIS — F17200 Nicotine dependence, unspecified, uncomplicated: Secondary | ICD-10-CM | POA: Diagnosis not present

## 2013-01-20 DIAGNOSIS — J441 Chronic obstructive pulmonary disease with (acute) exacerbation: Secondary | ICD-10-CM | POA: Diagnosis not present

## 2013-01-20 DIAGNOSIS — Z791 Long term (current) use of non-steroidal anti-inflammatories (NSAID): Secondary | ICD-10-CM | POA: Diagnosis not present

## 2013-01-20 DIAGNOSIS — R109 Unspecified abdominal pain: Secondary | ICD-10-CM | POA: Insufficient documentation

## 2013-01-20 DIAGNOSIS — J189 Pneumonia, unspecified organism: Secondary | ICD-10-CM | POA: Diagnosis not present

## 2013-01-20 DIAGNOSIS — Z79899 Other long term (current) drug therapy: Secondary | ICD-10-CM | POA: Insufficient documentation

## 2013-01-20 DIAGNOSIS — J984 Other disorders of lung: Secondary | ICD-10-CM | POA: Diagnosis not present

## 2013-01-20 DIAGNOSIS — Z872 Personal history of diseases of the skin and subcutaneous tissue: Secondary | ICD-10-CM | POA: Diagnosis not present

## 2013-01-20 DIAGNOSIS — Z880 Allergy status to penicillin: Secondary | ICD-10-CM | POA: Diagnosis not present

## 2013-01-20 DIAGNOSIS — M549 Dorsalgia, unspecified: Secondary | ICD-10-CM | POA: Diagnosis not present

## 2013-01-20 DIAGNOSIS — R609 Edema, unspecified: Secondary | ICD-10-CM | POA: Diagnosis not present

## 2013-01-20 DIAGNOSIS — Z8781 Personal history of (healed) traumatic fracture: Secondary | ICD-10-CM | POA: Diagnosis not present

## 2013-01-20 DIAGNOSIS — Z8619 Personal history of other infectious and parasitic diseases: Secondary | ICD-10-CM | POA: Insufficient documentation

## 2013-01-20 DIAGNOSIS — I252 Old myocardial infarction: Secondary | ICD-10-CM | POA: Diagnosis not present

## 2013-01-20 DIAGNOSIS — E785 Hyperlipidemia, unspecified: Secondary | ICD-10-CM | POA: Diagnosis not present

## 2013-01-20 DIAGNOSIS — G8929 Other chronic pain: Secondary | ICD-10-CM | POA: Insufficient documentation

## 2013-01-20 DIAGNOSIS — Z8719 Personal history of other diseases of the digestive system: Secondary | ICD-10-CM | POA: Insufficient documentation

## 2013-01-20 DIAGNOSIS — Z862 Personal history of diseases of the blood and blood-forming organs and certain disorders involving the immune mechanism: Secondary | ICD-10-CM | POA: Insufficient documentation

## 2013-01-20 DIAGNOSIS — M1990 Unspecified osteoarthritis, unspecified site: Secondary | ICD-10-CM | POA: Diagnosis not present

## 2013-01-20 DIAGNOSIS — Z7982 Long term (current) use of aspirin: Secondary | ICD-10-CM | POA: Diagnosis not present

## 2013-01-20 DIAGNOSIS — R079 Chest pain, unspecified: Secondary | ICD-10-CM | POA: Diagnosis not present

## 2013-01-20 DIAGNOSIS — R0602 Shortness of breath: Secondary | ICD-10-CM | POA: Diagnosis not present

## 2013-01-20 DIAGNOSIS — J449 Chronic obstructive pulmonary disease, unspecified: Secondary | ICD-10-CM

## 2013-01-20 LAB — COMPREHENSIVE METABOLIC PANEL
ALBUMIN: 3.2 g/dL — AB (ref 3.5–5.2)
ALT: 8 U/L (ref 0–35)
AST: 16 U/L (ref 0–37)
Alkaline Phosphatase: 154 U/L — ABNORMAL HIGH (ref 39–117)
BILIRUBIN TOTAL: 0.2 mg/dL — AB (ref 0.3–1.2)
BUN: 20 mg/dL (ref 6–23)
CALCIUM: 8.5 mg/dL (ref 8.4–10.5)
CHLORIDE: 104 meq/L (ref 96–112)
CO2: 28 mEq/L (ref 19–32)
CREATININE: 0.95 mg/dL (ref 0.50–1.10)
GFR calc Af Amer: 60 mL/min — ABNORMAL LOW (ref >90)
GFR, EST NON AFRICAN AMERICAN: 52 mL/min — AB (ref >90)
Glucose, Bld: 101 mg/dL — ABNORMAL HIGH (ref 70–99)
Potassium: 4.1 mEq/L (ref 3.7–5.3)
Sodium: 143 mEq/L (ref 137–147)
Total Protein: 7 g/dL (ref 6.0–8.3)

## 2013-01-20 LAB — CBC WITH DIFFERENTIAL/PLATELET
BASOS PCT: 0 % (ref 0–1)
Basophils Absolute: 0 10*3/uL (ref 0.0–0.1)
EOS PCT: 5 % (ref 0–5)
Eosinophils Absolute: 0.2 10*3/uL (ref 0.0–0.7)
HEMATOCRIT: 40.3 % (ref 36.0–46.0)
HEMOGLOBIN: 12.5 g/dL (ref 12.0–15.0)
Lymphocytes Relative: 44 % (ref 12–46)
Lymphs Abs: 1.6 10*3/uL (ref 0.7–4.0)
MCH: 28.9 pg (ref 26.0–34.0)
MCHC: 31 g/dL (ref 30.0–36.0)
MCV: 93.1 fL (ref 78.0–100.0)
MONO ABS: 0.4 10*3/uL (ref 0.1–1.0)
MONOS PCT: 10 % (ref 3–12)
NEUTROS ABS: 1.6 10*3/uL — AB (ref 1.7–7.7)
Neutrophils Relative %: 42 % — ABNORMAL LOW (ref 43–77)
Platelets: 203 10*3/uL (ref 150–400)
RBC: 4.33 MIL/uL (ref 3.87–5.11)
RDW: 15.3 % (ref 11.5–15.5)
WBC: 3.7 10*3/uL — ABNORMAL LOW (ref 4.0–10.5)

## 2013-01-20 LAB — TROPONIN I

## 2013-01-20 LAB — PRO B NATRIURETIC PEPTIDE: Pro B Natriuretic peptide (BNP): 280.8 pg/mL (ref 0–450)

## 2013-01-20 LAB — LIPASE, BLOOD: LIPASE: 19 U/L (ref 11–59)

## 2013-01-20 MED ORDER — IPRATROPIUM BROMIDE 0.02 % IN SOLN
0.5000 mg | Freq: Once | RESPIRATORY_TRACT | Status: AC
  Administered 2013-01-20: 0.5 mg via RESPIRATORY_TRACT
  Filled 2013-01-20: qty 2.5

## 2013-01-20 MED ORDER — SODIUM CHLORIDE 0.9 % IV SOLN
INTRAVENOUS | Status: DC
  Administered 2013-01-20: 20 mL/h via INTRAVENOUS

## 2013-01-20 MED ORDER — ALBUTEROL SULFATE (2.5 MG/3ML) 0.083% IN NEBU
5.0000 mg | INHALATION_SOLUTION | Freq: Once | RESPIRATORY_TRACT | Status: AC
Start: 2013-01-20 — End: 2013-01-20
  Administered 2013-01-20: 5 mg via RESPIRATORY_TRACT
  Filled 2013-01-20: qty 6

## 2013-01-20 MED ORDER — METHYLPREDNISOLONE SODIUM SUCC 125 MG IJ SOLR
125.0000 mg | Freq: Once | INTRAMUSCULAR | Status: AC
  Administered 2013-01-20: 125 mg via INTRAVENOUS
  Filled 2013-01-20: qty 2

## 2013-01-20 MED ORDER — PREDNISONE 10 MG PO TABS: 20.0000 mg | ORAL_TABLET | Freq: Every day | ORAL | Status: DC

## 2013-01-20 NOTE — ED Provider Notes (Signed)
CSN: [id number]     Arrival date & time 01/20/13  1937 History  This chart was scribed for Toy Baker, MD by Ardelia Mems, ED Scribe. This patient was seen in room APA02/APA02 and the patient's care was started at 7:52 PM.   Chief Complaint  Patient presents with  . Chest Pain    The history is provided by the patient. No language interpreter was used.    HPI Comments: Jennifer [name] is a 80 y.o. female who presents to the Emergency Department complaining of intermittent, "sharp" upper central chest pain onset today when pt was at rest. Pt reports associated back pain and abdominal pain today. Relative states that pt has had increased SOB today, and that her sentences have been choppier than usual. Relative states that pt has a history of similar chest pain, with a "slight heart attack" in 2003. The patient however denies any type of anginal chest pain and states her symptoms have only been for seconds and nonexertional. She also denies any leg pain or swelling. Relative states that pt had 324 mg aspirin PTA. Pt denies fever, cough or any other symptoms. Pt is an occasional smoker.   Past Medical History  Diagnosis Date  . Chest pain 2006  . COPD (chronic obstructive pulmonary disease)   . Hyperlipidemia   . Osteoarthritis     status post left TKA; surgery on the right is anticipated in the near future   . Nicotine addiction   . Gastroesophageal reflux disease   . Hiatal hernia   . Anemia     iron deficiency post op.  . Chronic back pain   . Pulmonary nodules     stable since 2006  . Cellulitis of finger     right   . Shingles     right breast   . Ulcer     gential  . Skin infection    Past Surgical History  Procedure Laterality Date  . Cholecystectomy    . Combined hysterectomy abdominal w/ a&p repair / oophorectomy  approx. 40 years ago   . Mastectomy  1998 & 2007     right -1998 / left 2007  . Total knee arthroplasty  4/08    left   . Right knee replacement   09/2009    Dr. Romeo Apple  . Abdominal hysterectomy     Family History  Problem Relation Age of Onset  . Cancer Mother 58    breast   . Cancer Father 71    prostate  . Hypertension Father   . Hypertension Sister   . Asthma Sister    History  Substance Use Topics  . Smoking status: Current Every Day Smoker -- 0.50 packs/day    Types: Cigarettes  . Smokeless tobacco: Not on file  . Alcohol Use: No   OB History   Grav Para Term Preterm Abortions TAB SAB Ect Mult Living                 Review of Systems  Constitutional: Negative for fever.  Respiratory: Positive for shortness of breath. Negative for cough.   Cardiovascular: Positive for chest pain.  Gastrointestinal: Positive for abdominal pain.  Musculoskeletal: Positive for back pain.  All other systems reviewed and are negative.   Allergies  Iohexol; Iron; and Penicillins  Home Medications   Current Outpatient Rx  Name  Route  Sig  Dispense  Refill  . acetaminophen (TYLENOL) 500 MG tablet   Oral   Take 500  mg by mouth every 6 (six) hours as needed for pain.         Marland Kitchen. albuterol-ipratropium (COMBIVENT) 18-103 MCG/ACT inhaler   Inhalation   Inhale 2 puffs into the lungs every 6 (six) hours as needed for shortness of breath.         Marland Kitchen. alendronate (FOSAMAX) 70 MG tablet   Oral   Take 1 tablet (70 mg total) by mouth once a week. Take with a full glass of water on an empty stomach.   4 tablet   11   . aspirin EC 81 MG tablet   Oral   Take 1 tablet (81 mg total) by mouth daily.   150 tablet   2   . calcium-vitamin D (OSCAL 500/200 D-3) 500-200 MG-UNIT per tablet   Oral   Take 1 tablet by mouth 2 (two) times daily.   180 tablet   3   . celecoxib (CELEBREX) 200 MG capsule      TAKE 1 CAPSULE BY MOUTH ONCE DAILY   30 capsule   0   . donepezil (ARICEPT) 5 MG tablet      TAKE 1 TABLET BY MOUTH EVERY NIGHT AT BEDTIME   30 tablet   0   . gabapentin (NEURONTIN) 300 MG capsule      TAKE 1 CAPSULE BY  MOUTH TWICE DAILY, DISCONTINUE 100MG    60 capsule   0   . lidocaine (LIDODERM) 5 %      PLACE 3 PATCHES ONTO THE SKIN EVERY 12 HOURS REMOVE AND DISCARD PATCH WITHIN 12 HOURS OR AS DIRECTED BY DOCTOR   90 patch   0    Triage Vitals: BP 134/74  Pulse 77  Temp(Src) 98.2 F (36.8 C) (Oral)  Resp 20  Wt 189 lb (85.73 kg)  SpO2 97%  Physical Exam  Nursing note and vitals reviewed. Constitutional: She is oriented to person, place, and time. She appears well-developed and well-nourished.  Non-toxic appearance. No distress.  HENT:  Head: Normocephalic and atraumatic.  Eyes: Conjunctivae, EOM and lids are normal. Pupils are equal, round, and reactive to light.  Neck: Normal range of motion. Neck supple. No tracheal deviation present. No mass present.  Cardiovascular: Normal rate, regular rhythm and normal heart sounds.  Exam reveals no gallop.   No murmur heard. Pulmonary/Chest: Effort normal. No stridor. No respiratory distress. She has no decreased breath sounds. She has wheezes (Diffuse inspiratory and expiratory wheezing bilaterally.). She has no rhonchi. She has no rales.  Abdominal: Soft. Normal appearance and bowel sounds are normal. She exhibits no distension. There is no tenderness. There is no rebound and no CVA tenderness.  Musculoskeletal: Normal range of motion. She exhibits edema (1+ pitting bilateral lower extremity edema.). She exhibits no tenderness.  Neurological: She is alert and oriented to person, place, and time. She has normal strength. No cranial nerve deficit or sensory deficit. GCS eye subscore is 4. GCS verbal subscore is 5. GCS motor subscore is 6.  Skin: Skin is warm and dry. No abrasion and no rash noted.  Psychiatric: She has a normal mood and affect. Her speech is normal and behavior is normal.    ED Course  Procedures (including critical care time)  DIAGNOSTIC STUDIES: Oxygen Saturation is 97% on RA, normal by my interpretation.    COORDINATION OF  CARE: 7:59 PM- Will order a CXR and diagnostic lab work. Will also order a breathing treatment, Solu-medrol and IV fluids. Pt advised of plan for treatment and pt  agrees.  Medications  0.9 %  sodium chloride infusion (not administered)  albuterol (PROVENTIL) (2.5 MG/3ML) 0.083% nebulizer solution 5 mg (not administered)  ipratropium (ATROVENT) nebulizer solution 0.5 mg (not administered)  methylPREDNISolone sodium succinate (SOLU-MEDROL) 125 mg/2 mL injection 125 mg (not administered)   Labs Review Labs Reviewed  TROPONIN I  CBC WITH DIFFERENTIAL  COMPREHENSIVE METABOLIC PANEL  PRO B NATRIURETIC PEPTIDE  LIPASE, BLOOD   Imaging Review No results found.  EKG Interpretation    Date/Time:  Friday January 20 2013 19:43:40 EST Ventricular Rate:  76 PR Interval:  154 QRS Duration: 72 QT Interval:  390 QTC Calculation: 438 R Axis:   36 Text Interpretation:  Normal sinus rhythm Normal ECG When compared with ECG of 26-May-2012 19:57, Premature ventricular complexes are no longer Present Confirmed by Murlene Revell  MD, Sullivan Blasing ([id number]) on 01/20/2013 8:14:25 PM            MDM  No diagnosis found.    I personally performed the services described in this documentation, which was scribed in my presence. The recorded information has been reviewed and is accurate.   9:23 PM Spoke with patient at length about her current symptoms. She states that her chest pain was only lasted for one to 2 seconds and was not associated with shortness of breath, diaphoresis. States reason for presenting was because of her back pain. She states that she has chronic vertebral disc disease. She also had mild wheezing appreciated and she does have a history of COPD and she was given albuterol treatment along with Solu-Medrol. I do not think that she has a pulmonary embolism for ACS. She'll be discharged home on prednisone taper.  Toy Baker, MD 01/20/13 2125

## 2013-01-20 NOTE — Discharge Instructions (Signed)
Chronic Obstructive Pulmonary Disease  Chronic obstructive pulmonary disease (COPD) is a common lung condition in which airflow from the lungs is limited. COPD is a general term that can be used to describe many different lung problems that limit airflow, including both chronic bronchitis and emphysema.  If you have COPD, your lung function will probably never return to normal, but there are measures you can take to improve lung function and make yourself feel better.   CAUSES   · Smoking (common).    · Exposure to secondhand smoke.    · Genetic problems.  · Chronic inflammatory lung diseases or recurrent infections.  SYMPTOMS   · Shortness of breath, especially with physical activity.    · Deep, persistent (chronic) cough with a large amount of thick mucus.    · Wheezing.    · Rapid breaths (tachypnea).    · Gray or bluish discoloration (cyanosis) of the skin, especially in fingers, toes, or lips.    · Fatigue.    · Weight loss.    · Frequent infections or episodes when breathing symptoms become much worse (exacerbations).    · Chest tightness.  DIAGNOSIS   Your healthcare provider will take a medical history and perform a physical examination to make the initial diagnosis.  Additional tests for COPD may include:   · Lung (pulmonary) function tests.  · Chest X-ray.  · CT scan.  · Blood tests.  TREATMENT   Treatment available to help you feel better when you have COPD include:   · Inhaler and nebulizer medicines. These help manage the symptoms of COPD and make your breathing more comfortable  · Supplemental oxygen. Supplemental oxygen is only helpful if you have a low oxygen level in your blood.    · Exercise and physical activity. These are beneficial for nearly all people with COPD. Some people may also benefit from a pulmonary rehabilitation program.  HOME CARE INSTRUCTIONS   · Take all medicines (inhaled or pills) as directed by your health care provider.  · Only take over-the-counter or prescription medicines  for pain, fever, or discomfort as directed by your health care provider.    · Avoid over-the-counter medicines or cough syrups that dry up your airway (such as antihistamines) and slow down the elimination of secretions unless instructed otherwise by your healthcare provider.    · If you are a smoker, the most important thing that you can do is stop smoking. Continuing to smoke will cause further lung damage and breathing trouble. Ask your health care provider for help with quitting smoking. He or she can direct you to community resources or hospitals that provide support.  · Avoid exposure to irritants such as smoke, chemicals, and fumes that aggravate your breathing.  · Use oxygen therapy and pulmonary rehabilitation if directed by your health care provider. If you require home oxygen therapy, ask your healthcare provider whether you should purchase a pulse oximeter to measure your oxygen level at home.    · Avoid contact with individuals who have a contagious illness.  · Avoid extreme temperature and humidity changes.  · Eat healthy foods. Eating smaller, more frequent meals and resting before meals may help you maintain your strength.  · Stay active, but balance activity with periods of rest. Exercise and physical activity will help you maintain your ability to do things you want to do.  · Preventing infection and hospitalization is very important when you have COPD. Make sure to receive all the vaccines your health care provider recommends, especially the pneumococcal and influenza vaccines. Ask your healthcare provider whether you   need a pneumonia vaccine.  · Learn and use relaxation techniques to manage stress.  · Learn and use controlled breathing techniques as directed by your health care provider. Controlled breathing techniques include:    · Pursed lip breathing. Start by breathing in (inhaling) through your nose for 1 second. Then, purse your lips as if you were going to whistle and breathe out (exhale)  through the pursed lips for 2 seconds.    · Diaphragmatic breathing. Start by putting one hand on your abdomen just above your waist. Inhale slowly through your nose. The hand on your abdomen should move out. Then purse your lips and exhale slowly. You should be able to feel the hand on your abdomen moving in as you exhale.    · Learn and use controlled coughing to clear mucus from your lungs. Controlled coughing is a series of short, progressive coughs. The steps of controlled coughing are:    1. Lean your head slightly forward.    2. Breathe in deeply using diaphragmatic breathing.    3. Try to hold your breath for 3 seconds.    4. Keep your mouth slightly open while coughing twice.    5. Spit any mucus out into a tissue.    6. Rest and repeat the steps once or twice as needed.  SEEK MEDICAL CARE IF:   · You are coughing up more mucus than usual.    · There is a change in the color or thickness of your mucus.    · Your breathing is more labored than usual.    · Your breathing is faster than usual.    SEEK IMMEDIATE MEDICAL CARE IF:   · You have shortness of breath while you are resting.    · You have shortness of breath that prevents you from:  · Being able to talk.    · Performing your usual physical activities.    · You have chest pain lasting longer than 5 minutes.    · Your skin color is more cyanotic than usual.  · You measure low oxygen saturations for longer than 5 minutes with a pulse oximeter.  MAKE SURE YOU:   · Understand these instructions.  · Will watch your condition.  · Will get help right away if you are not doing well or get worse.  Document Released: 10/08/2004 Document Revised: 10/19/2012 Document Reviewed: 08/25/2012  [employer]® Patient Information ©2014 [employer].

## 2013-01-20 NOTE — ED Notes (Signed)
Chest pain, sob, onset today.

## 2013-02-07 ENCOUNTER — Other Ambulatory Visit: Payer: Self-pay | Admitting: Family Medicine

## 2013-02-07 DIAGNOSIS — R5381 Other malaise: Secondary | ICD-10-CM | POA: Diagnosis not present

## 2013-02-07 DIAGNOSIS — M899 Disorder of bone, unspecified: Secondary | ICD-10-CM | POA: Diagnosis not present

## 2013-02-07 DIAGNOSIS — R5383 Other fatigue: Secondary | ICD-10-CM | POA: Diagnosis not present

## 2013-02-07 DIAGNOSIS — M949 Disorder of cartilage, unspecified: Secondary | ICD-10-CM | POA: Diagnosis not present

## 2013-02-07 LAB — CBC WITH DIFFERENTIAL/PLATELET
Basophils Absolute: 0 10*3/uL (ref 0.0–0.1)
Basophils Relative: 1 % (ref 0–1)
Eosinophils Absolute: 0.2 10*3/uL (ref 0.0–0.7)
Eosinophils Relative: 7 % — ABNORMAL HIGH (ref 0–5)
HCT: 39.5 % (ref 36.0–46.0)
Hemoglobin: 12.4 g/dL (ref 12.0–15.0)
Lymphocytes Relative: 41 % (ref 12–46)
Lymphs Abs: 1.2 10*3/uL (ref 0.7–4.0)
MCH: 28.2 pg (ref 26.0–34.0)
MCHC: 31.4 g/dL (ref 30.0–36.0)
MCV: 89.8 fL (ref 78.0–100.0)
Monocytes Absolute: 0.3 10*3/uL (ref 0.1–1.0)
Monocytes Relative: 10 % (ref 3–12)
Neutro Abs: 1.2 10*3/uL — ABNORMAL LOW (ref 1.7–7.7)
Neutrophils Relative %: 41 % — ABNORMAL LOW (ref 43–77)
Platelets: 202 10*3/uL (ref 150–400)
RBC: 4.4 MIL/uL (ref 3.87–5.11)
RDW: 15.7 % — ABNORMAL HIGH (ref 11.5–15.5)
WBC: 3 10*3/uL — ABNORMAL LOW (ref 4.0–10.5)

## 2013-02-07 LAB — BASIC METABOLIC PANEL
BUN: 13 mg/dL (ref 6–23)
CO2: 30 mEq/L (ref 19–32)
Calcium: 8.3 mg/dL — ABNORMAL LOW (ref 8.4–10.5)
Chloride: 105 mEq/L (ref 96–112)
Creat: 0.62 mg/dL (ref 0.50–1.10)
Glucose, Bld: 86 mg/dL (ref 70–99)
Potassium: 4.1 mEq/L (ref 3.5–5.3)
Sodium: 142 mEq/L (ref 135–145)

## 2013-02-08 ENCOUNTER — Other Ambulatory Visit (HOSPITAL_COMMUNITY): Payer: Self-pay | Admitting: Family Medicine

## 2013-02-08 ENCOUNTER — Other Ambulatory Visit: Payer: Self-pay

## 2013-02-08 LAB — VITAMIN D 25 HYDROXY (VIT D DEFICIENCY, FRACTURES): Vit D, 25-Hydroxy: 16 ng/mL — ABNORMAL LOW (ref 30–89)

## 2013-02-08 MED ORDER — VITAMIN D (ERGOCALCIFEROL) 1.25 MG (50000 UNIT) PO CAPS: 50000.0000 [IU] | ORAL_CAPSULE | ORAL | Status: DC

## 2013-02-11 ENCOUNTER — Other Ambulatory Visit: Payer: Self-pay | Admitting: Family Medicine

## 2013-02-21 ENCOUNTER — Ambulatory Visit: Payer: Medicare Other | Admitting: Orthopedic Surgery

## 2013-03-18 IMAGING — CR DG LUMBAR SPINE COMPLETE 4+V
5 series · 5 of 5 positions shown · non-contrast
Comparison: 01/15/2009

CLINICAL DATA: Pain.

LUMBAR SPINE - COMPLETE 4+ VIEW

[view not recorded (1 of 5)]
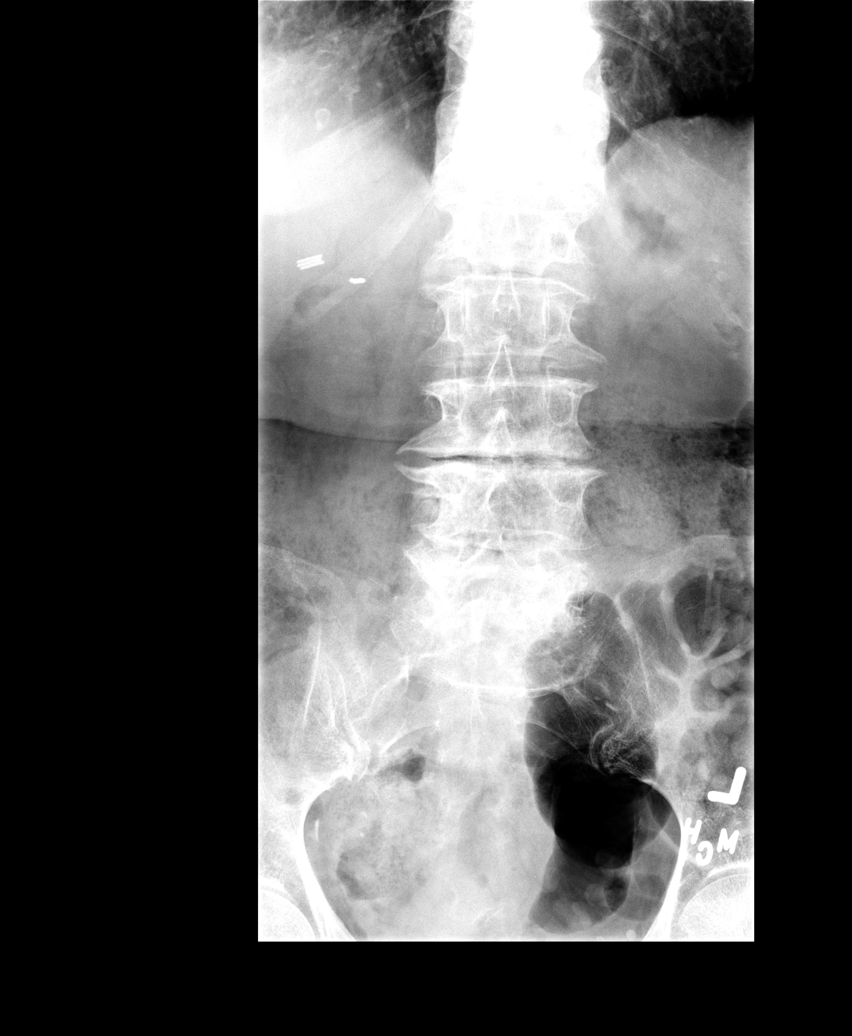

[view not recorded (2 of 5)]
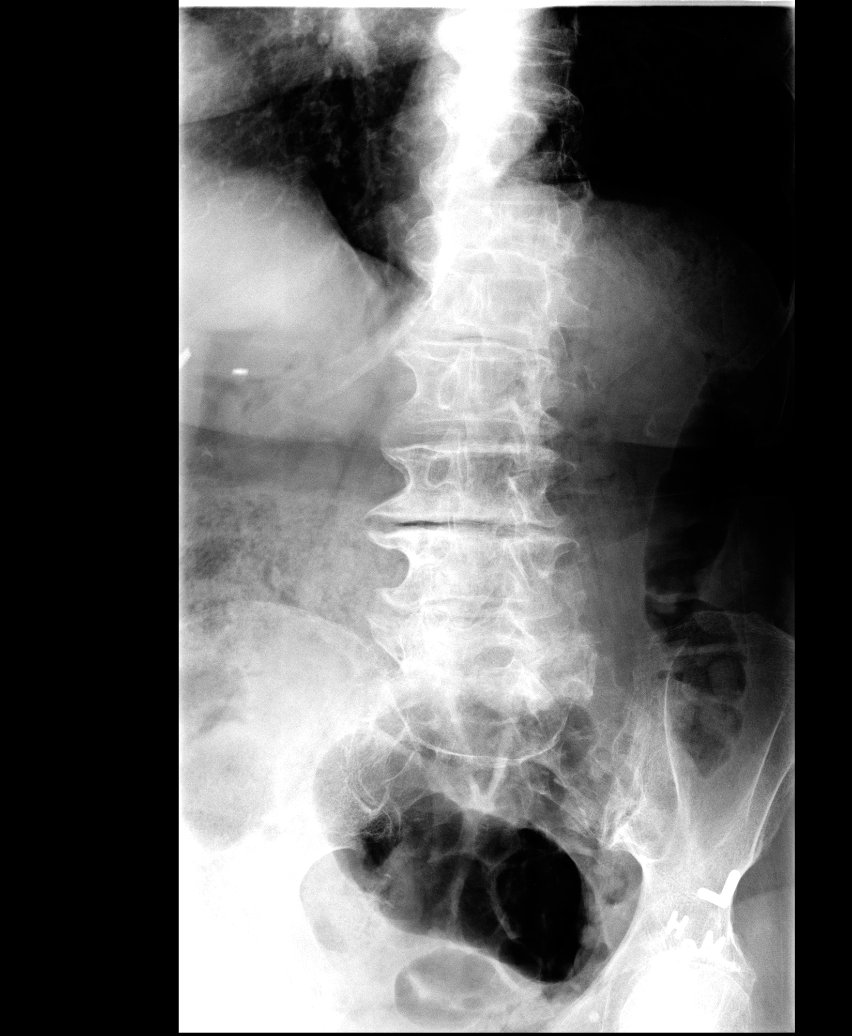

[view not recorded (3 of 5)]
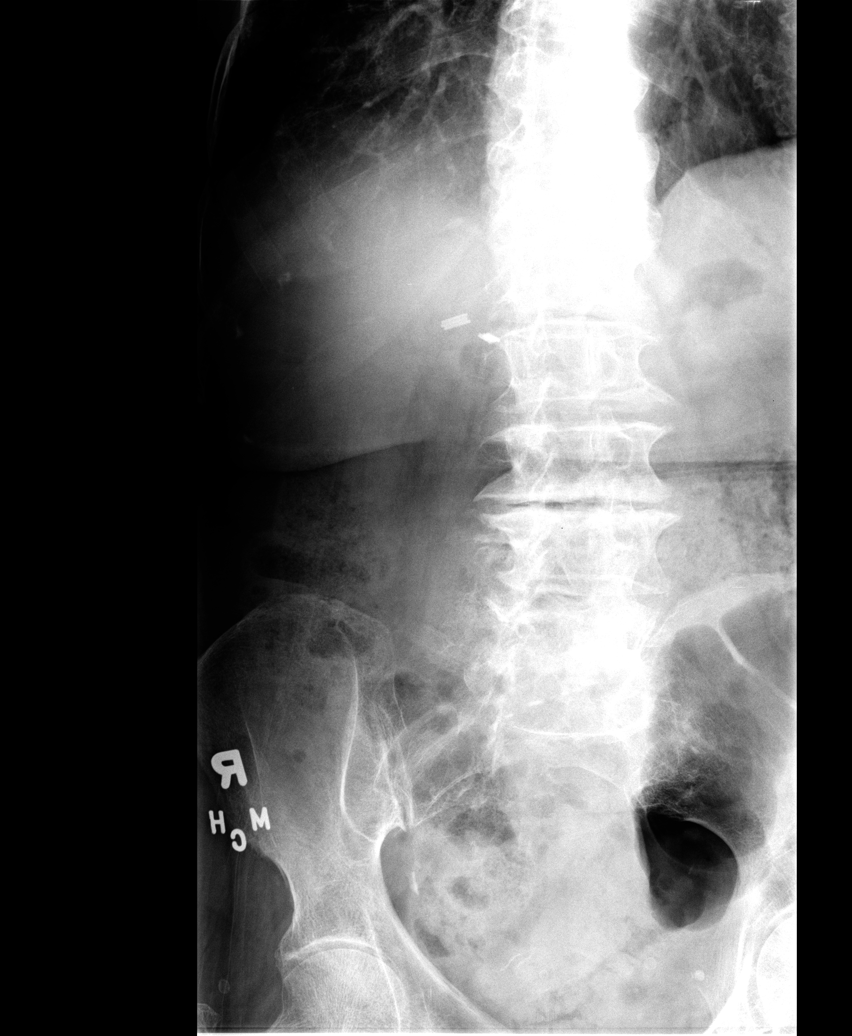

[view not recorded (4 of 5)]
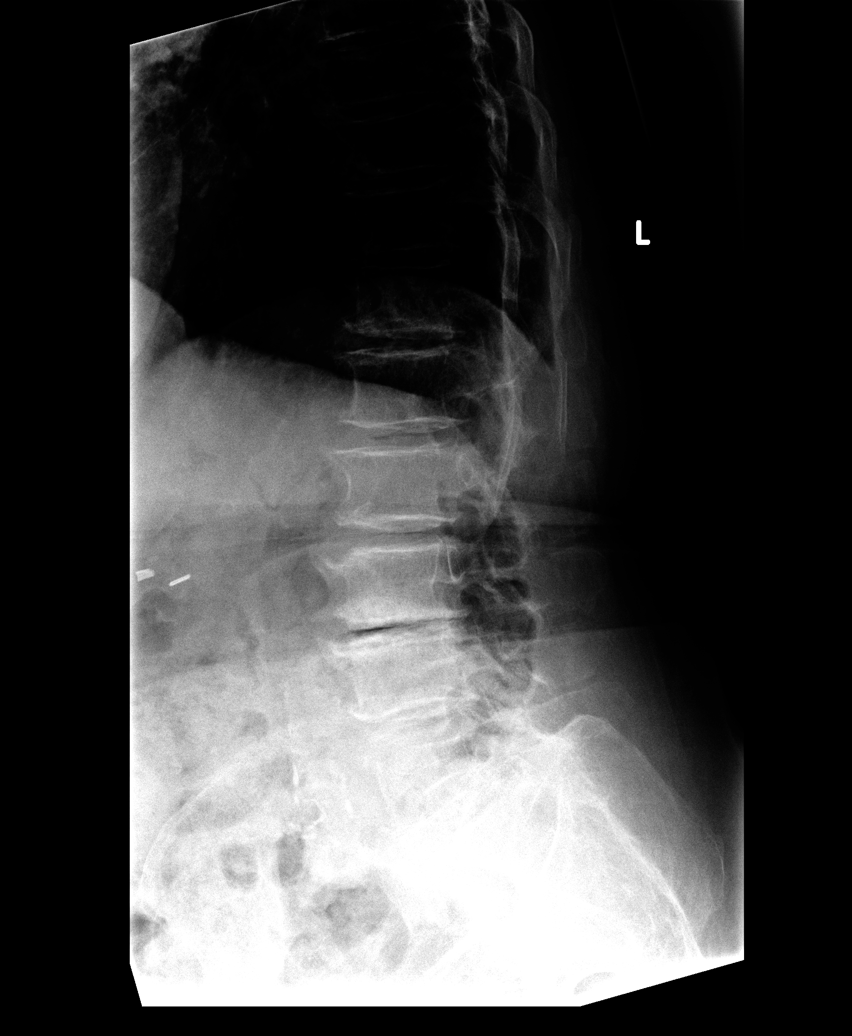

[view not recorded (5 of 5)]
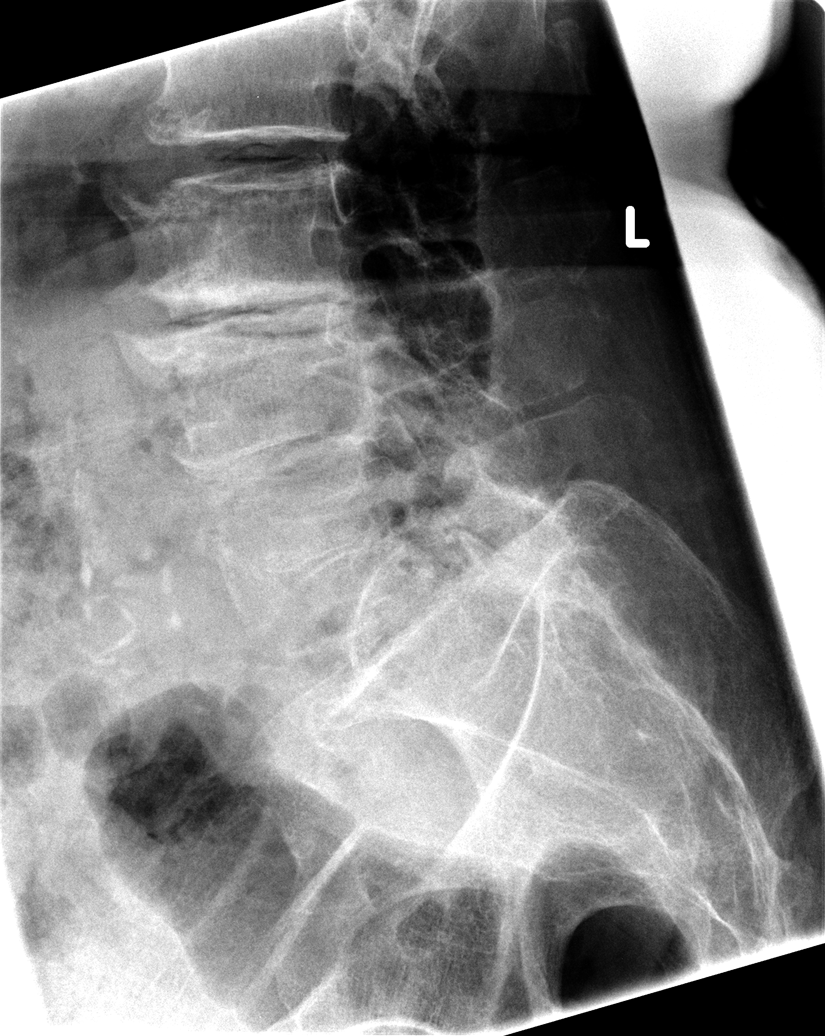

[5 of 5 positions shown; findings below may reference images not displayed]

FINDINGS: Diffuse degenerative disc disease and facet disease
throughout the lumbar spine.  No acute bony abnormality.  No
fracture or subluxation.  SI joints are symmetric and unremarkable.
Large stool burden throughout the colon.  Prior cholecystectomy.
IMPRESSION: Advanced spondylosis.  No acute findings or change.

## 2013-03-18 IMAGING — CR DG THORACIC SPINE 3V
3 series · 3 of 3 positions shown · non-contrast
Comparison: 06/25/2011

CLINICAL DATA: Chronic back pain.  Spinal stenosis.

THORACIC SPINE - 2 VIEW + SWIMMERS

[view not recorded (1 of 3)]
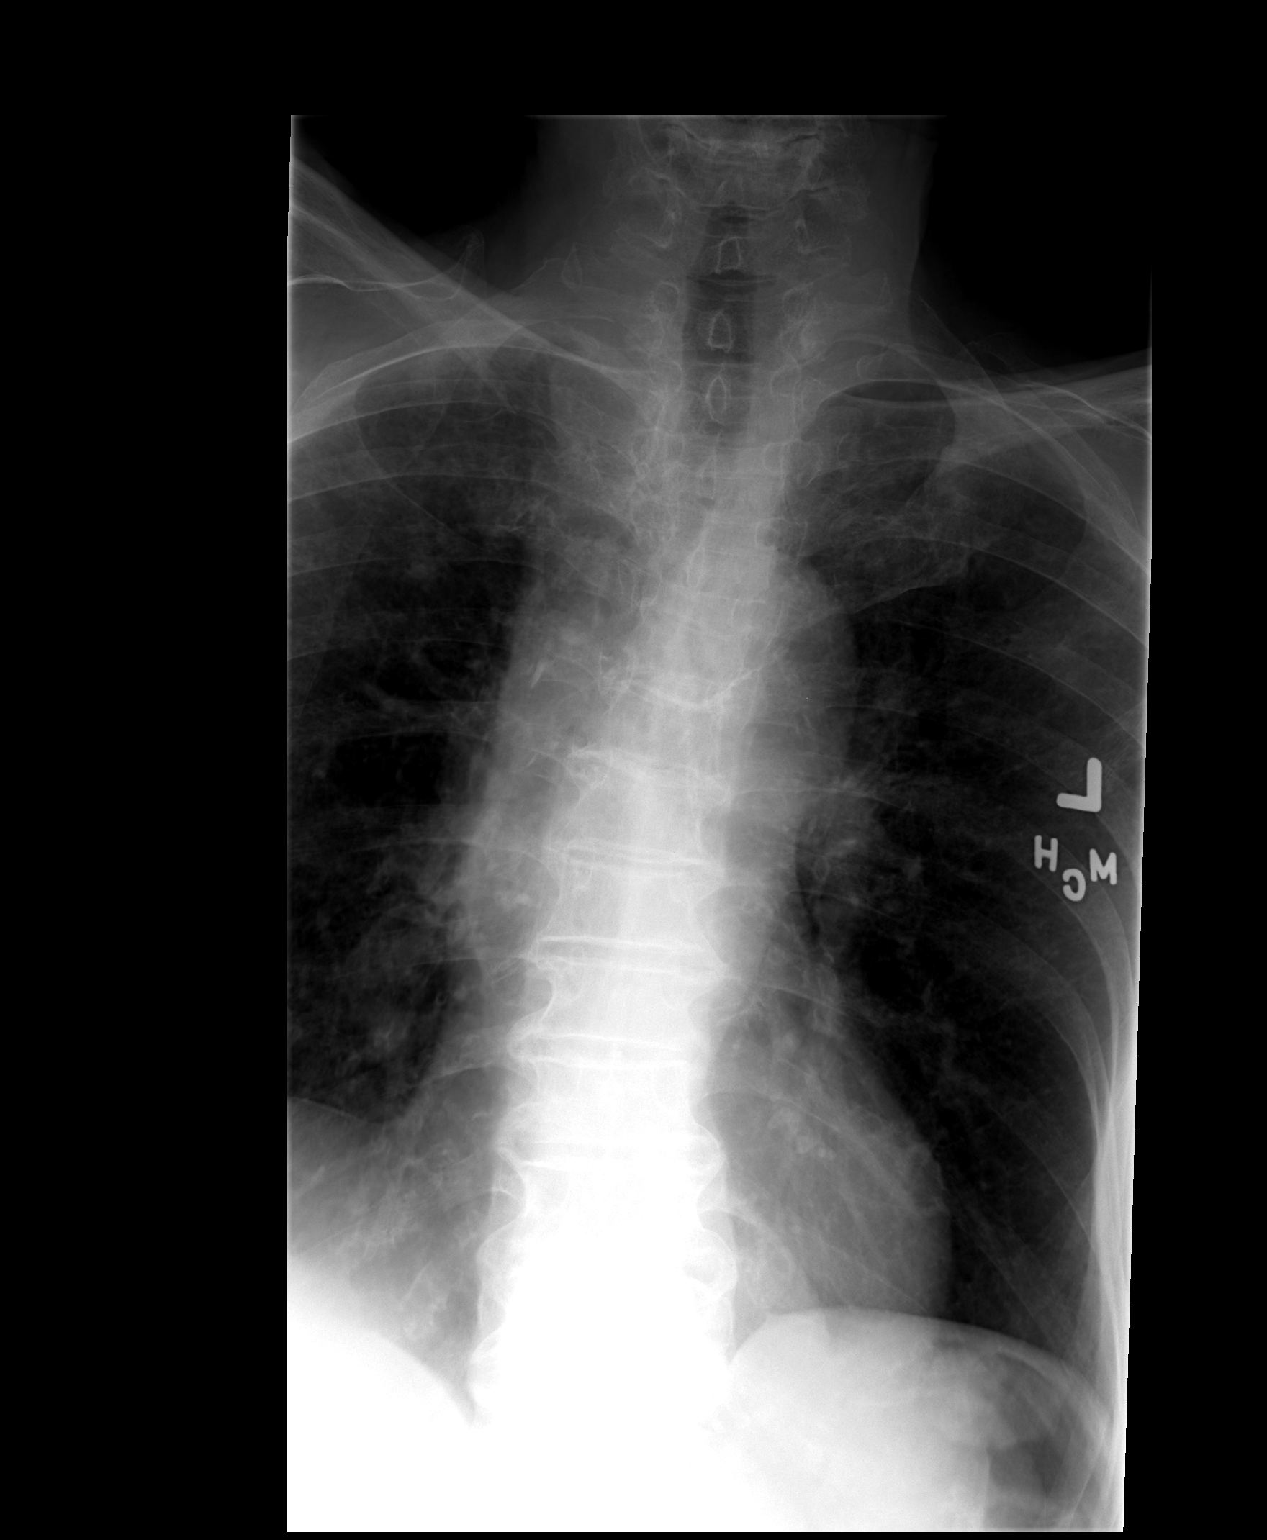

[view not recorded (2 of 3)]
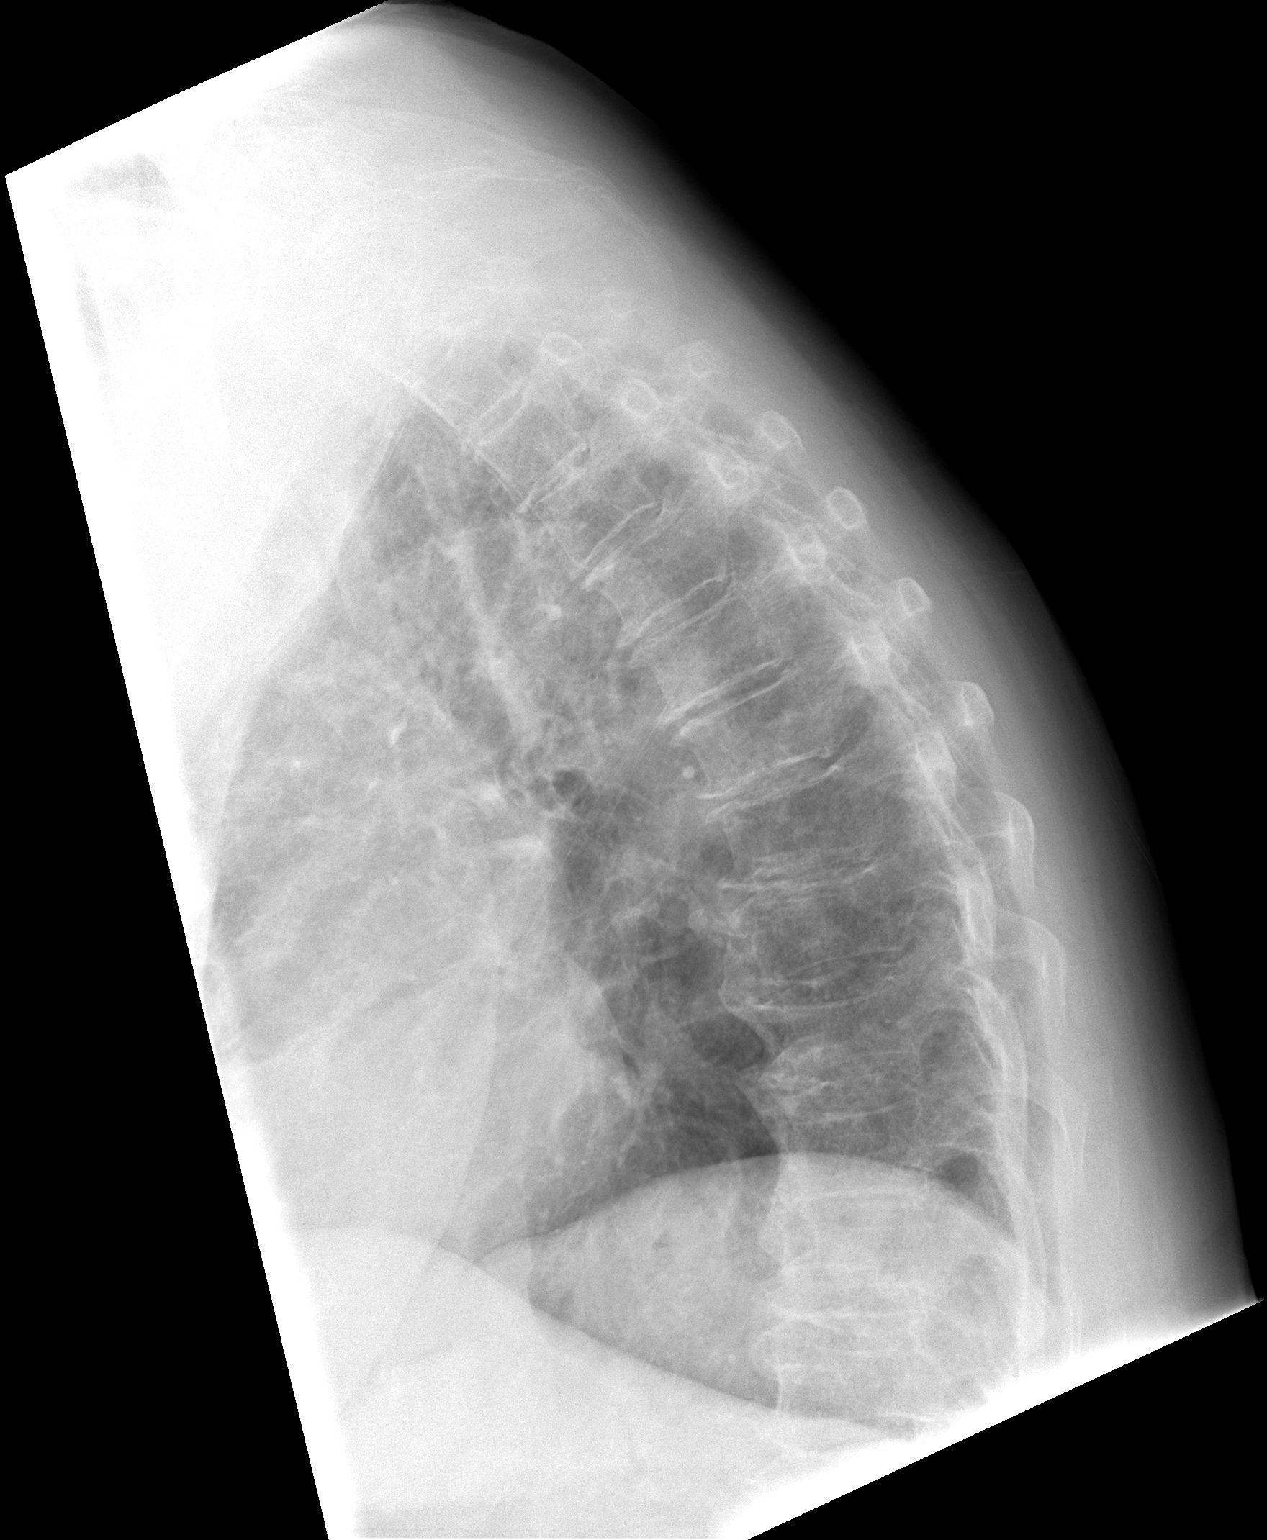

[view not recorded (3 of 3)]
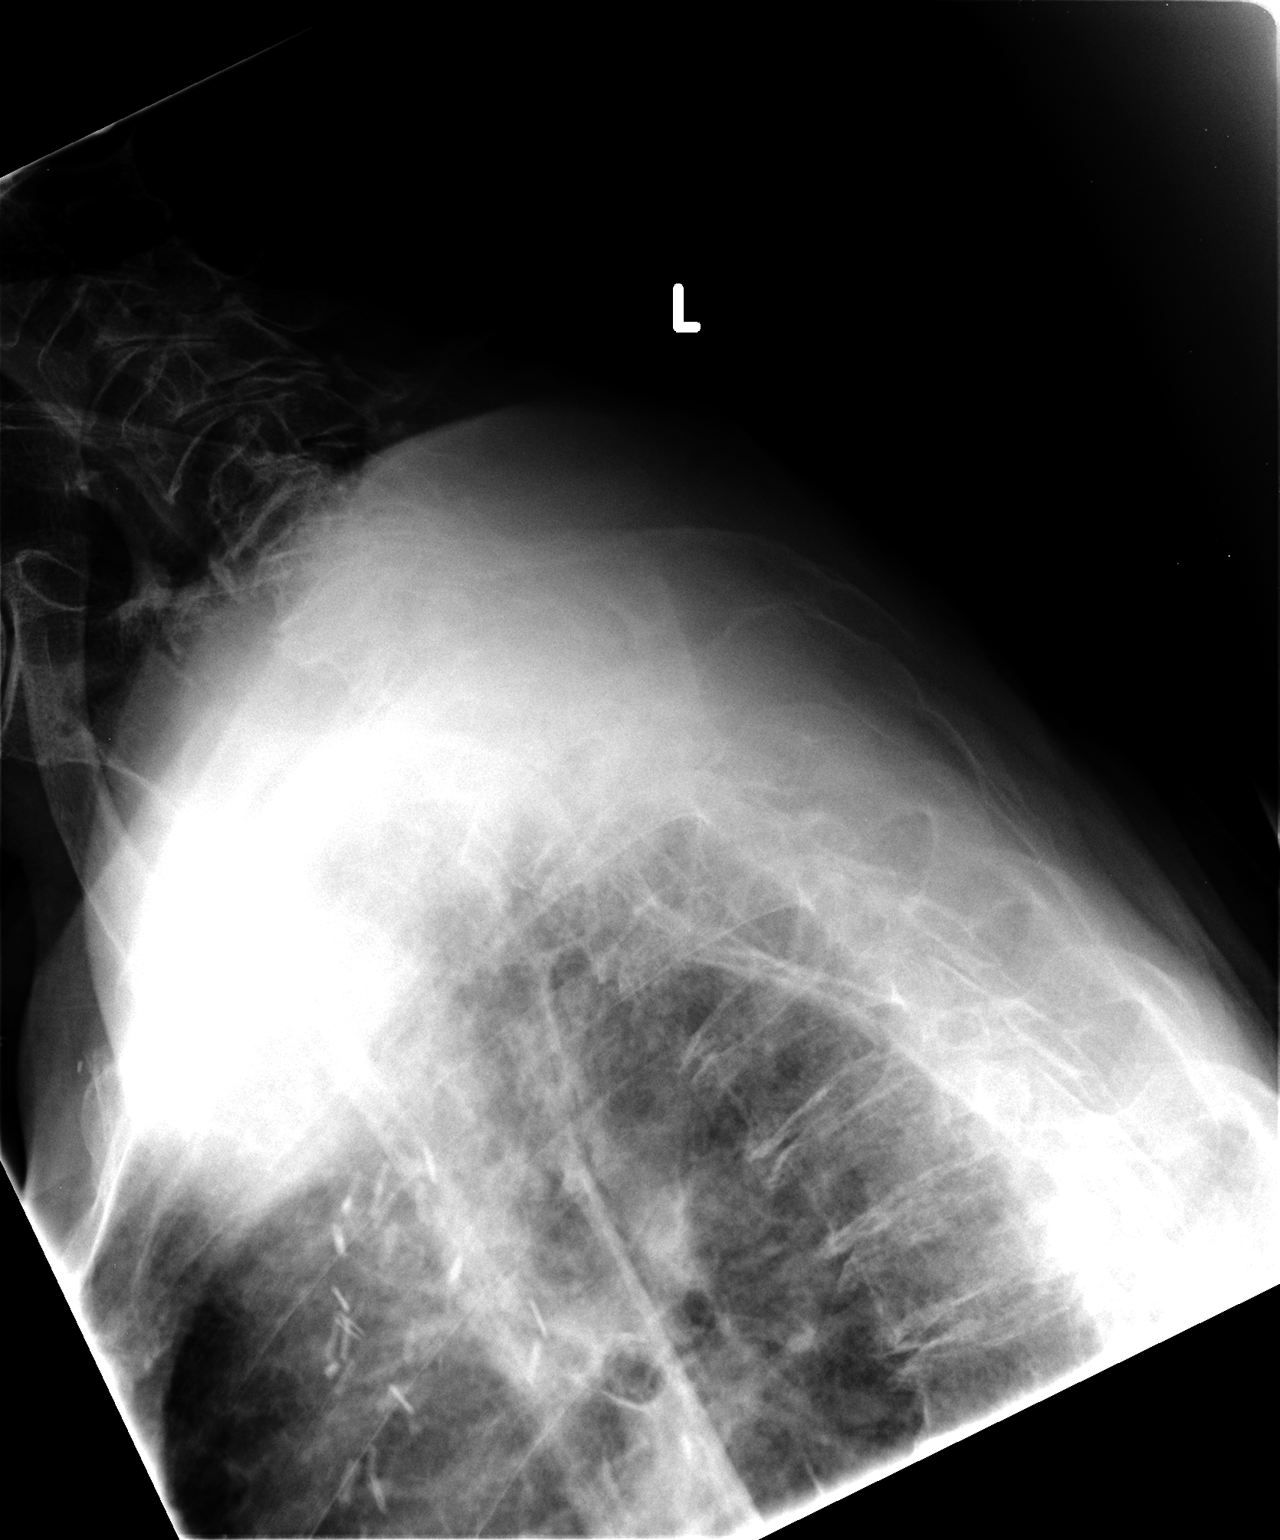

[3 of 3 positions shown; findings below may reference images not displayed]

FINDINGS: Diffuse degenerative spurring throughout the thoracic
spine.  Mild disc space narrowing diffusely.  Slight rightward
scoliosis.  No fracture or acute subluxation.
IMPRESSION: Degenerative changes.  Slight rightward scoliosis.  No acute
findings.

## 2013-03-30 DIAGNOSIS — H902 Conductive hearing loss, unspecified: Secondary | ICD-10-CM | POA: Diagnosis not present

## 2013-03-30 DIAGNOSIS — H6120 Impacted cerumen, unspecified ear: Secondary | ICD-10-CM | POA: Diagnosis not present

## 2013-04-04 ENCOUNTER — Encounter: Payer: Self-pay | Admitting: Family Medicine

## 2013-04-04 ENCOUNTER — Ambulatory Visit (INDEPENDENT_AMBULATORY_CARE_PROVIDER_SITE_OTHER): Payer: Medicare Other | Admitting: Family Medicine

## 2013-04-04 ENCOUNTER — Encounter (INDEPENDENT_AMBULATORY_CARE_PROVIDER_SITE_OTHER): Payer: Self-pay

## 2013-04-04 VITALS — BP 122/82 | HR 95 | Resp 18 | Ht 65.0 in | Wt 192.1 lb

## 2013-04-04 DIAGNOSIS — R32 Unspecified urinary incontinence: Secondary | ICD-10-CM

## 2013-04-04 DIAGNOSIS — M810 Age-related osteoporosis without current pathological fracture: Secondary | ICD-10-CM

## 2013-04-04 DIAGNOSIS — F17200 Nicotine dependence, unspecified, uncomplicated: Secondary | ICD-10-CM

## 2013-04-04 DIAGNOSIS — I2510 Atherosclerotic heart disease of native coronary artery without angina pectoris: Secondary | ICD-10-CM | POA: Diagnosis not present

## 2013-04-04 DIAGNOSIS — Z9181 History of falling: Secondary | ICD-10-CM

## 2013-04-04 DIAGNOSIS — G3184 Mild cognitive impairment, so stated: Secondary | ICD-10-CM

## 2013-04-04 DIAGNOSIS — J449 Chronic obstructive pulmonary disease, unspecified: Secondary | ICD-10-CM | POA: Diagnosis not present

## 2013-04-04 MED ORDER — VITAMIN D (ERGOCALCIFEROL) 1.25 MG (50000 UNIT) PO CAPS: 50000.0000 [IU] | ORAL_CAPSULE | ORAL | Status: DC

## 2013-04-04 MED ORDER — BUDESONIDE-FORMOTEROL FUMARATE 160-4.5 MCG/ACT IN AERO: 2.0000 | INHALATION_SPRAY | Freq: Two times a day (BID) | RESPIRATORY_TRACT | Status: DC

## 2013-04-04 MED ORDER — RISEDRONATE SODIUM 35 MG PO TABS: 35.0000 mg | ORAL_TABLET | ORAL | Status: DC

## 2013-04-04 MED ORDER — SOLIFENACIN SUCCINATE 5 MG PO TABS: 5.0000 mg | ORAL_TABLET | Freq: Every day | ORAL | Status: DC

## 2013-04-04 MED ORDER — ASPIRIN EC 81 MG PO TBEC: 81.0000 mg | DELAYED_RELEASE_TABLET | Freq: Every day | ORAL | Status: DC

## 2013-04-04 MED ORDER — CALCIUM CARBONATE-VITAMIN D 500-200 MG-UNIT PO TABS: 1.0000 | ORAL_TABLET | Freq: Two times a day (BID) | ORAL | Status: DC

## 2013-04-04 NOTE — Progress Notes (Signed)
Subjective:    Patient ID: Jennifer [name], female    DOB: Jun 03, 1922, 80 y.o.   MRN: [id number]  HPI The PT is here for follow up and re-evaluation of chronic medical conditions, medication management and review of any available recent lab and radiology data.  Preventive health is updated, specifically  Immunization.    The PT denies any adverse reactions to current medications since the last visit. Daughter is still resistant to an increase in the dose of aricept.so she is not at the therapeutic target dose Jennifer [name] C/O increased episdoes of wetting herself, primarily because of poor mobility, her knees and back hurt "all the time"  He states she will put her grandson in in charge of her cigarettes but is unwilling to set a quit date C/o increased fatigue and shortness of breath wit activity over past several months, denies chest pain. Remains resistant to the use of oxygen even if she qualifies, presumably because she has no plan to give up cigarettes as yet        Review of Systems See HPI Denies recent fever or chills. Denies sinus pressure, nasal congestion, ear pain or sore throat. Denies chest congestin or , productive cough increased SOB and  wheezing. Denies chest pains, palpitations and leg swelling Denies abdominal pain, nausea, vomiting,diarrhea or constipation.   Denies dysuria, frequency, hesitancy does have  incontinence. . Denies headaches, seizures, chronic lower extremity  numbness, and  tingling. Denies depression, anxiety or insomnia. Denies skin break down or rash.        Objective:   Physical Exam BP 122/82  Pulse 95  Resp 18  Ht 5\' 5"  (1.651 m)  Wt 192 lb 1.9 oz (87.145 kg)  BMI 31.97 kg/m2  SpO2 94% Patient alert and oriented and in no cardiopulmonary distress.  HEENT: No facial asymmetry, EOMI, no sinus tenderness,  oropharynx pink and moist.  Neck supple no adenopathy.  Chest: Clear to auscultation bilaterally.Decreased air entry  throughout  CVS: S1, S2 no murmurs, no S3.  ABD: Soft non tender. Bowel sounds normal.  Ext: No edema  MS: decreased ROM spine, shoulders, hips and knees.  Skin: Intact, no ulcerations or rash noted.  Psych: Good eye contact, normal affect. Memory loss not anxious or depressed appearing.  CNS: CN 2-12 intact, power, normal throughout.        Assessment & Plan:  CORONARY ARTERY DISEASE At increased risk of ACS, due to established CAD and ongoing nicotine use, still on no regular aspirin , daily aspirin 81mg  dose is  Emphasized , also smoking cessation counselling provided   COPD (chronic obstructive pulmonary disease) Deteriorating reports increased exertinal dyspnea, refuses oxygen even if indicated. Patient counseled for approximately 5 minutes regarding the health risks of ongoing nicotine use, specifically all types of cancer, heart disease, stroke and respiratory failure. The options available for help with cessation ,the behavioral changes to assist the process, and the option to either gradully reduce usage  Or abruptly stop.is also discussed. Pt is also encouraged to set specific goals in number of cigarettes used daily, as well as to set a quit date.  Start sym,bicort   Osteoporosis, post-menopausal importance of daily calcium with D and weekly bisphosphonate is stressed. Home safety also discussed and literature provided  TOBACCO ABUSE Unchanged,. Patient counseled for approximately 5 minutes regarding the health risks of ongoing nicotine use, specifically all types of cancer, heart disease, stroke and respiratory failure. The options available for help with cessation ,the behavioral changes  to assist the process, and the option to either gradully reduce usage  Or abruptly stop.is also discussed. Pt is also encouraged to set specific goals in number of cigarettes used daily, as well as to set a quit date.   MCI (mild cognitive impairment) Not at target  dose,  however, daughter resistant to increase in medication dosage, will continue same, needs rept MMSE  Urinary incontinence Deteriorated, marked limitation in mobility due to osteoarthritis, will start a tril of vesicare, no symptoms of UTI Re educated pt about behaviot modification to address the problem also  At high risk for falls Ambulates with  Cane, and c/o chronic joint pain and stiffness espescially in the knees Reduction in fall risk and safety in the home discussed , and literature  provided on the discharge instruction sheet as well.

## 2013-04-04 NOTE — Patient Instructions (Signed)
Annual wellness in 3.5 month, call if you need me  Before  NEW medications  Symbicort inhaler for daily use for breathing  Calcium, Vit D and actonel for bone health   Vesicare for wetting  Aspirin every day for stroke risk reduction  Reduce cigarettes by 2 per day every 2 weeks, your grandson is in charge, start at 10 per day, you should have quit in 5 weeks  Urine to be stated for infection, in office or take container home and bring it back  Fall Prevention and Home Safety Falls cause injuries and can affect all age groups. It is possible to prevent falls.  HOW TO PREVENT FALLS  Wear shoes with rubber soles that do not have an opening for your toes.  Keep the inside and outside of your house well lit.  Use night lights throughout your home.  Remove clutter from floors.  Clean up floor spills.  Remove throw rugs or fasten them to the floor with carpet tape.  Do not place electrical cords across pathways.  Put grab bars by your tub, shower, and toilet. Do not use towel bars as grab bars.  Put handrails on both sides of the stairway. Fix loose handrails.  Do not climb on stools or stepladders, if possible.  Do not wax your floors.  Repair uneven or unsafe sidewalks, walkways, or stairs.  Keep items you use a lot within reach.  Be aware of pets.  Keep emergency numbers next to the telephone.  Put smoke detectors in your home and near bedrooms. Ask your doctor what other things you can do to prevent falls. Document Released: 10/25/2008 Document Revised: 06/30/2011 Document Reviewed: 03/31/2011 [facility]Exit[facility] Patient Information 2014 [place], [place].

## 2013-04-05 ENCOUNTER — Other Ambulatory Visit: Payer: Self-pay

## 2013-04-05 MED ORDER — ALENDRONATE SODIUM 70 MG PO TABS: 70.0000 mg | ORAL_TABLET | ORAL | Status: DC

## 2013-04-06 ENCOUNTER — Other Ambulatory Visit: Payer: Self-pay | Admitting: Family Medicine

## 2013-04-06 MED ORDER — OXYBUTYNIN CHLORIDE ER 5 MG PO TB24: 5.0000 mg | ORAL_TABLET | Freq: Every day | ORAL | Status: DC

## 2013-04-17 ENCOUNTER — Other Ambulatory Visit (HOSPITAL_COMMUNITY): Payer: Self-pay | Admitting: Family Medicine

## 2013-04-18 ENCOUNTER — Other Ambulatory Visit: Payer: Self-pay

## 2013-04-18 MED ORDER — CELECOXIB 200 MG PO CAPS: ORAL_CAPSULE | ORAL | Status: DC

## 2013-04-29 ENCOUNTER — Encounter: Payer: Self-pay | Admitting: Family Medicine

## 2013-04-29 NOTE — Assessment & Plan Note (Signed)
Ambulates with  Cane, and c/o chronic joint pain and stiffness espescially in the knees Reduction in fall risk and safety in the home discussed , and literature  provided on the discharge instruction sheet as well.

## 2013-04-29 NOTE — Assessment & Plan Note (Addendum)
Deteriorated, marked limitation in mobility due to osteoarthritis, will start a tril of vesicare, no symptoms of UTI Re educated pt about behaviot modification to address the problem also

## 2013-04-29 NOTE — Assessment & Plan Note (Signed)
Unchanged, Patient counseled for approximately 5 minutes regarding the health risks of ongoing nicotine use, specifically all types of cancer, heart disease, stroke and respiratory failure. The options available for help with cessation ,the behavioral changes to assist the process, and the option to either gradully reduce usage  Or abruptly stop.is also discussed. Pt is also encouraged to set specific goals in number of cigarettes used daily, as well as to set a quit date.  

## 2013-04-29 NOTE — Assessment & Plan Note (Signed)
Not at target  dose, however, daughter resistant to increase in medication dosage, will continue same, needs rept MMSE

## 2013-04-29 NOTE — Assessment & Plan Note (Signed)
At increased risk of ACS, due to established CAD and ongoing nicotine use, still on no regular aspirin , daily aspirin 81mg  dose is  Emphasized , also smoking cessation counselling provided

## 2013-04-29 NOTE — Assessment & Plan Note (Signed)
importance of daily calcium with D and weekly bisphosphonate is stressed. Home safety also discussed and literature provided

## 2013-04-29 NOTE — Assessment & Plan Note (Addendum)
Deteriorating reports increased exertinal dyspnea, refuses oxygen even if indicated. Patient counseled for approximately 5 minutes regarding the health risks of ongoing nicotine use, specifically all types of cancer, heart disease, stroke and respiratory failure. The options available for help with cessation ,the behavioral changes to assist the process, and the option to either gradully reduce usage  Or abruptly stop.is also discussed. Pt is also encouraged to set specific goals in number of cigarettes used daily, as well as to set a quit date.  Start sym,bicort

## 2013-05-18 ENCOUNTER — Other Ambulatory Visit: Payer: Self-pay | Admitting: Family Medicine

## 2013-05-18 ENCOUNTER — Other Ambulatory Visit: Payer: Self-pay

## 2013-05-18 MED ORDER — GABAPENTIN 300 MG PO CAPS: 300.0000 mg | ORAL_CAPSULE | Freq: Two times a day (BID) | ORAL | Status: DC

## 2013-05-18 MED ORDER — DONEPEZIL HCL 5 MG PO TABS: ORAL_TABLET | ORAL | Status: DC

## 2013-05-29 ENCOUNTER — Ambulatory Visit (INDEPENDENT_AMBULATORY_CARE_PROVIDER_SITE_OTHER): Payer: Medicare Other | Admitting: Family Medicine

## 2013-05-29 ENCOUNTER — Encounter (INDEPENDENT_AMBULATORY_CARE_PROVIDER_SITE_OTHER): Payer: Self-pay

## 2013-05-29 VITALS — BP 122/76 | HR 66 | Resp 18 | Ht 65.0 in | Wt 202.0 lb

## 2013-05-29 DIAGNOSIS — R32 Unspecified urinary incontinence: Secondary | ICD-10-CM

## 2013-05-29 DIAGNOSIS — I2510 Atherosclerotic heart disease of native coronary artery without angina pectoris: Secondary | ICD-10-CM | POA: Diagnosis not present

## 2013-05-29 DIAGNOSIS — F17200 Nicotine dependence, unspecified, uncomplicated: Secondary | ICD-10-CM

## 2013-05-29 DIAGNOSIS — M549 Dorsalgia, unspecified: Secondary | ICD-10-CM | POA: Diagnosis not present

## 2013-05-29 DIAGNOSIS — J449 Chronic obstructive pulmonary disease, unspecified: Secondary | ICD-10-CM

## 2013-05-29 DIAGNOSIS — G3184 Mild cognitive impairment, so stated: Secondary | ICD-10-CM

## 2013-05-29 DIAGNOSIS — L259 Unspecified contact dermatitis, unspecified cause: Secondary | ICD-10-CM | POA: Diagnosis not present

## 2013-05-29 DIAGNOSIS — L309 Dermatitis, unspecified: Secondary | ICD-10-CM

## 2013-05-29 MED ORDER — VITAMIN D (ERGOCALCIFEROL) 1.25 MG (50000 UNIT) PO CAPS: 50000.0000 [IU] | ORAL_CAPSULE | ORAL | Status: DC

## 2013-05-29 MED ORDER — KETOROLAC TROMETHAMINE 60 MG/2ML IM SOLN
60.0000 mg | Freq: Once | INTRAMUSCULAR | Status: AC
  Administered 2013-05-29: 60 mg via INTRAMUSCULAR

## 2013-05-29 MED ORDER — METHYLPREDNISOLONE ACETATE 80 MG/ML IJ SUSP
80.0000 mg | Freq: Once | INTRAMUSCULAR | Status: AC
  Administered 2013-05-29: 80 mg via INTRAMUSCULAR

## 2013-05-29 MED ORDER — OXYBUTYNIN CHLORIDE ER 5 MG PO TB24: 5.0000 mg | ORAL_TABLET | Freq: Every day | ORAL | Status: DC

## 2013-05-29 MED ORDER — PREDNISONE 5 MG PO TABS: 5.0000 mg | ORAL_TABLET | Freq: Two times a day (BID) | ORAL | Status: AC

## 2013-05-29 MED ORDER — BUDESONIDE-FORMOTEROL FUMARATE 160-4.5 MCG/ACT IN AERO: 2.0000 | INHALATION_SPRAY | Freq: Two times a day (BID) | RESPIRATORY_TRACT | Status: DC

## 2013-05-29 NOTE — Patient Instructions (Signed)
F/u  As before   Depo medrol 80mg  IM today for rash, followed by prednisone for 5 days  Toradol 8381m,g IM   For back pain  Please consider increasing the aricept for  memory

## 2013-06-05 ENCOUNTER — Encounter: Payer: Self-pay | Admitting: Family Medicine

## 2013-06-05 NOTE — Assessment & Plan Note (Signed)
Depo medrol in office followed by oral prednisone, short course. Appropriate skin cover and reduction in mosquito burden discussed with grandson, states [place] lot [place] mosquitos

## 2013-06-05 NOTE — Assessment & Plan Note (Signed)
Grandson notes worsening of her memory , with recent increase in confusion, mixing updays,  Getting dressed for [place] midweek. He is to discuss this with her daughter, Earlean Shawl is his mom, she has consistently resisted treating pt at full dose of aricept, which is what is needed to be most effective

## 2013-06-05 NOTE — Assessment & Plan Note (Signed)
Regular use of inhalers recommended again to improve symptom of dyspnea. Tobacco cessation counseling done, under the supervision of  Her grandson now reportedly down to less than 5 per day which is wonderful

## 2013-06-05 NOTE — Assessment & Plan Note (Signed)
Uncontrolled today , toradol and steroid injection at [facility]

## 2013-06-05 NOTE — Assessment & Plan Note (Signed)
iuncreased wtting episodes, mainly due to poor mobility, pt to use oral me for symptom control

## 2013-06-05 NOTE — Progress Notes (Signed)
   Subjective:    Patient ID: Jennifer [name], female    DOB: Sep 02, 1922, 80 y.o.   MRN: [id number]  HPI Pt in with 5 day h/o pruritic rash initially on right upper extremity, now also affecting left to a lesser extent. Exposed to multiple mosquitos at home which are felt to be the inciting factor. No  Fever, myalgia, change in appetite or c/o muscle [pain. No drainage from sites C/o increased and uncontrolled back pain for past 3 days, no trigger aggravating the symptom, radiaites down lower extremities, deniesfrecent falls Improvement in nicotine, still no quit date is set Increased wetting incidents noted, needs help wih medication, mobility limitation is the major underlying problem Grandson notes increase in confusion and memory loss over time , will discuss medication manaegment with his Mom before any changes are made   Review of Systems See HPI Denies recent fever or chills. Denies sinus pressure, nasal congestion, ear pain or sore throat. Denies chest congestion, productive cough or wheezing. Denies chest pains, palpitations and leg swelling Denies abdominal pain, nausea, vomiting,diarrhea or constipation.   Denies dysuria, frequency, hesitancy .  Denies headaches, seizures, numbness, or tingling. Denies depression, anxiety or insomnia.         Objective:   Physical Exam BP 122/76  Pulse 66  Resp 18  Ht 5\' 5"  (1.651 m)  Wt 202 lb (91.627 kg)  BMI 33.61 kg/m2  SpO2 94% Patient alert and oriented and in no cardiopulmonary distress.  HEENT: No facial asymmetry, EOMI, no sinus tenderness,  oropharynx pink and moist.  Neck decreased ROMJ no adenopathy.  Chest: Clear to auscultation bilaterally.Decreased air entry throughout  CVS: S1, S2 no murmurs, no S3.  ABD: Soft non tender. Bowel sounds normal.  Ext: No edema  MS: decreased  ROM spine, shoulders, hips and knees.  Skin: Intact, erythematous macular  rash noted in both upper extremities, circular resembling  insect bites, no warmth, mildly tender, no dranage  Psych: Good eye contact, normal affect. Memory loss not anxious or depressed appearing.  CNS: CN 2-12 intact,         Assessment & Plan:

## 2013-06-05 NOTE — Assessment & Plan Note (Signed)
Improved though still not willing to set quit date, down to less than 5 per day, grandson managing nicotine access reportedly Patient counseled for approximately 5 minutes regarding the health risks of ongoing nicotine use, specifically all types of cancer, heart disease, stroke and respiratory failure. The options available for help with cessation ,the behavioral changes to assist the process, and the option to either gradully reduce usage  Or abruptly stop.is also discussed. Pt is also encouraged to set specific goals in number of cigarettes used daily, as well as to set a quit date.

## 2013-08-14 ENCOUNTER — Other Ambulatory Visit: Payer: Self-pay | Admitting: Family Medicine

## 2013-09-05 ENCOUNTER — Encounter (INDEPENDENT_AMBULATORY_CARE_PROVIDER_SITE_OTHER): Payer: Self-pay

## 2013-09-05 ENCOUNTER — Ambulatory Visit (INDEPENDENT_AMBULATORY_CARE_PROVIDER_SITE_OTHER): Payer: Medicare Other | Admitting: Family Medicine

## 2013-09-05 ENCOUNTER — Encounter: Payer: Self-pay | Admitting: Family Medicine

## 2013-09-05 VITALS — BP 152/76 | HR 76 | Resp 18 | Ht 65.0 in | Wt 208.0 lb

## 2013-09-05 DIAGNOSIS — M549 Dorsalgia, unspecified: Secondary | ICD-10-CM

## 2013-09-05 DIAGNOSIS — Z9181 History of falling: Secondary | ICD-10-CM

## 2013-09-05 DIAGNOSIS — R5383 Other fatigue: Secondary | ICD-10-CM | POA: Diagnosis not present

## 2013-09-05 DIAGNOSIS — IMO0001 Reserved for inherently not codable concepts without codable children: Secondary | ICD-10-CM

## 2013-09-05 DIAGNOSIS — N39498 Other specified urinary incontinence: Secondary | ICD-10-CM

## 2013-09-05 DIAGNOSIS — R5381 Other malaise: Secondary | ICD-10-CM | POA: Diagnosis not present

## 2013-09-05 DIAGNOSIS — Z79899 Other long term (current) drug therapy: Secondary | ICD-10-CM

## 2013-09-05 DIAGNOSIS — G3184 Mild cognitive impairment, so stated: Secondary | ICD-10-CM | POA: Diagnosis not present

## 2013-09-05 DIAGNOSIS — M810 Age-related osteoporosis without current pathological fracture: Secondary | ICD-10-CM | POA: Diagnosis not present

## 2013-09-05 DIAGNOSIS — J449 Chronic obstructive pulmonary disease, unspecified: Secondary | ICD-10-CM

## 2013-09-05 DIAGNOSIS — F17200 Nicotine dependence, unspecified, uncomplicated: Secondary | ICD-10-CM

## 2013-09-05 DIAGNOSIS — I2510 Atherosclerotic heart disease of native coronary artery without angina pectoris: Secondary | ICD-10-CM | POA: Diagnosis not present

## 2013-09-05 DIAGNOSIS — R030 Elevated blood-pressure reading, without diagnosis of hypertension: Secondary | ICD-10-CM

## 2013-09-05 DIAGNOSIS — I10 Essential (primary) hypertension: Secondary | ICD-10-CM

## 2013-09-05 MED ORDER — GABAPENTIN 300 MG PO CAPS: ORAL_CAPSULE | ORAL | Status: DC

## 2013-09-05 NOTE — Patient Instructions (Signed)
F/u in January, cal if you need me before  Flu vaccine after 09/30  Fasting lipid, cmp,  As soon as convenient  Be carefull not to fall  Only med change at this time, is gabapentin one in the morning and TWO at bedtime  Prescription will be sent to [facility] for pain patches also

## 2013-09-08 DIAGNOSIS — R5383 Other fatigue: Secondary | ICD-10-CM | POA: Diagnosis not present

## 2013-09-08 DIAGNOSIS — Z79899 Other long term (current) drug therapy: Secondary | ICD-10-CM | POA: Diagnosis not present

## 2013-09-08 DIAGNOSIS — R5381 Other malaise: Secondary | ICD-10-CM | POA: Diagnosis not present

## 2013-09-08 LAB — LIPID PANEL
CHOLESTEROL: 158 mg/dL (ref 0–200)
HDL: 58 mg/dL (ref >39)
LDL Cholesterol: 86 mg/dL (ref 0–99)
Total CHOL/HDL Ratio: 2.7 Ratio
Triglycerides: 68 mg/dL (ref <150)
VLDL: 14 mg/dL (ref 0–40)

## 2013-09-08 LAB — COMPREHENSIVE METABOLIC PANEL
ALK PHOS: 113 U/L (ref 39–117)
ALT: <8 U/L (ref 0–35)
AST: 12 U/L (ref 0–37)
Albumin: 3.3 g/dL — ABNORMAL LOW (ref 3.5–5.2)
BUN: 11 mg/dL (ref 6–23)
CALCIUM: 8.2 mg/dL — AB (ref 8.4–10.5)
CO2: 29 mEq/L (ref 19–32)
CREATININE: 0.73 mg/dL (ref 0.50–1.10)
Chloride: 107 mEq/L (ref 96–112)
GLUCOSE: 82 mg/dL (ref 70–99)
Potassium: 4.1 mEq/L (ref 3.5–5.3)
Sodium: 143 mEq/L (ref 135–145)
TOTAL PROTEIN: 6.1 g/dL (ref 6.0–8.3)
Total Bilirubin: 0.3 mg/dL (ref 0.2–1.2)

## 2013-09-29 DIAGNOSIS — Z23 Encounter for immunization: Secondary | ICD-10-CM | POA: Diagnosis not present

## 2013-11-20 ENCOUNTER — Telehealth: Payer: Self-pay | Admitting: Family Medicine

## 2013-11-20 NOTE — Telephone Encounter (Signed)
Called patient and left message for them to return call at the office   

## 2013-11-21 NOTE — Telephone Encounter (Signed)
Pt's daughter called wanting to speak with the nurse about pt's medication. Please advise [phone]

## 2013-11-23 NOTE — Telephone Encounter (Signed)
pls document why she wants to replace celebrex...cost, safety or what? If cost meloxicam 7.5 mg in place make her aware that generally any med other than tylenol not recommended for long term daily arthritic pain , based on safety issues, heart and kidneys and inc risk of GI bleed. If does not want the meloxicam due to safety, recommend up to four 500mg  tylenol tablets daily  ??pls ask

## 2013-11-23 NOTE — Telephone Encounter (Signed)
Daughter would like to know if there is a substitute for Oxybutynin  due to cost.  Also would like to know if patient can d/c Celebrex and if there is something to replace it.

## 2013-11-25 DIAGNOSIS — Z23 Encounter for immunization: Secondary | ICD-10-CM | POA: Diagnosis not present

## 2013-11-27 DIAGNOSIS — H908 Mixed conductive and sensorineural hearing loss, unspecified: Secondary | ICD-10-CM | POA: Diagnosis not present

## 2013-11-27 DIAGNOSIS — H903 Sensorineural hearing loss, bilateral: Secondary | ICD-10-CM | POA: Diagnosis not present

## 2013-11-27 DIAGNOSIS — H9071 Mixed conductive and sensorineural hearing loss, unilateral, right ear, with unrestricted hearing on the contralateral side: Secondary | ICD-10-CM | POA: Diagnosis not present

## 2013-11-27 DIAGNOSIS — H6500 Acute serous otitis media, unspecified ear: Secondary | ICD-10-CM | POA: Diagnosis not present

## 2013-11-27 NOTE — Telephone Encounter (Signed)
Daughter is aware of change of Meloxicam to Tylenol 500mg .  She also states that she will call back in December to let the office know what insurance she will have for 2016.

## 2013-12-13 ENCOUNTER — Ambulatory Visit (INDEPENDENT_AMBULATORY_CARE_PROVIDER_SITE_OTHER): Payer: Medicare Other | Admitting: Family Medicine

## 2013-12-13 ENCOUNTER — Other Ambulatory Visit: Payer: Self-pay

## 2013-12-13 ENCOUNTER — Encounter: Payer: Self-pay | Admitting: Family Medicine

## 2013-12-13 ENCOUNTER — Encounter (INDEPENDENT_AMBULATORY_CARE_PROVIDER_SITE_OTHER): Payer: Self-pay

## 2013-12-13 VITALS — BP 142/78 | HR 82 | Resp 18 | Ht 65.0 in | Wt 208.0 lb

## 2013-12-13 DIAGNOSIS — F17210 Nicotine dependence, cigarettes, uncomplicated: Secondary | ICD-10-CM

## 2013-12-13 DIAGNOSIS — I10 Essential (primary) hypertension: Secondary | ICD-10-CM

## 2013-12-13 DIAGNOSIS — J449 Chronic obstructive pulmonary disease, unspecified: Secondary | ICD-10-CM | POA: Diagnosis not present

## 2013-12-13 DIAGNOSIS — F17200 Nicotine dependence, unspecified, uncomplicated: Secondary | ICD-10-CM

## 2013-12-13 DIAGNOSIS — G3184 Mild cognitive impairment, so stated: Secondary | ICD-10-CM

## 2013-12-13 DIAGNOSIS — R030 Elevated blood-pressure reading, without diagnosis of hypertension: Secondary | ICD-10-CM

## 2013-12-13 DIAGNOSIS — N3946 Mixed incontinence: Secondary | ICD-10-CM | POA: Diagnosis not present

## 2013-12-13 DIAGNOSIS — IMO0001 Reserved for inherently not codable concepts without codable children: Secondary | ICD-10-CM

## 2013-12-13 DIAGNOSIS — M159 Polyosteoarthritis, unspecified: Secondary | ICD-10-CM

## 2013-12-13 DIAGNOSIS — I2510 Atherosclerotic heart disease of native coronary artery without angina pectoris: Secondary | ICD-10-CM

## 2013-12-13 DIAGNOSIS — M150 Primary generalized (osteo)arthritis: Secondary | ICD-10-CM

## 2013-12-13 MED ORDER — LIDOCAINE 5 % EX PTCH: 3.0000 | MEDICATED_PATCH | CUTANEOUS | Status: DC

## 2013-12-13 NOTE — Patient Instructions (Addendum)
Annual wellness in 4  Month, call if you need me before  Resume daily pain patches up to 3 per day for 12 hours each day, we will send in a 90 day supply (lidoderm)  May take no more than SIX tylenol 500mg  tablets daily, try to take the lowest effective dose  Will give /send a script for topical rub on knees filled at [facility]  STOP /cut back on junk food, increase healthy food and water  GOOD that cigarettes are reduced

## 2013-12-13 NOTE — Progress Notes (Signed)
Subjective:    Patient ID: Jennifer [name], female    DOB: 10/15/1922, 80 y.o.   MRN: [id number]  HPI The PT is here for follow up and re-evaluation of chronic medical conditions, medication management and review of any available recent lab and radiology data.  Preventive health is updated, specifically   Immunization.    The PT denies any adverse reactions to current medications since the last visit. Her daughter is concerned about NSAID use for her arthritis and wants her off this type of medication       Review of Systems See HPI Denies recent fever or chills. Denies sinus pressure, nasal congestion, ear pain or sore throat. Denies chest congestion,  productive cough c/o intermittent shortness of breath and  wheezing. Denies chest pains, palpitations and leg swelling Denies abdominal pain, nausea, vomiting,diarrhea or constipation.   Denies dysuria or  frequency.  Denies headaches, seizures, numbness, or tingling. Denies depression, anxiety or insomnia. Denies skin break down or rash.        Objective:   Physical Exam  BP 142/78 mmHg  Pulse 82  Resp 18  Ht 5\' 5"  (1.651 m)  Wt 208 lb (94.348 kg)  BMI 34.61 kg/m2  SpO2 96% Patient alert and oriented and in no cardiopulmonary distress.  HEENT: No facial asymmetry, EOMI,   oropharynx pink and moist.  Neck decreased ROM,no JVD, no mass.  Chest: Clear to auscultation bilaterally.decreased air entry throughout, no crackles or wheezes  CVS: S1, S2 no murmurs, no S3.Regular rate.  ABD: Soft non tender.   Ext: No edema  MS: decreased  ROM spine, shoulders, hips and knees.  Skin: Intact, no ulcerations or rash noted.  Psych: Good eye contact, normal affect. Memory loss not anxious or depressed appearing.  CNS: CN 2-12 intact, power,  normal throughout.no focal deficits noted.       Assessment & Plan:  Osteoarthritis Generalized involving primarily spine ands knees. Pain management discussed with pt and her  daughter Tylenol and lidoderm are the mainstay, she may use topical anti inflammatory also   Elevated systolic blood pressure Managed by low sodium diet , encouraged to increase vegetable intake, no medciations as systolic is mildly elevated   TOBACCO ABUSE Reduced usage but still smoking and unwilling to set a quit date Patient counseled for approximately 5 minutes regarding the health risks of ongoing nicotine use, specifically all types of cancer, heart disease, stroke and respiratory failure. The options available for help with cessation ,the behavioral changes to assist the process, and the option to either gradully reduce usage  Or abruptly stop.is also discussed. Pt is also encouraged to set specific goals in number of cigarettes used daily, as well as to set a quit date.    Urinary incontinence Reviewed toileting on a regular schedule, will start vesicare as well to help with control   COPD (chronic obstructive pulmonary disease) Worsening due to ongoing nicotine use Patient counseled for approximately 5 minutes regarding the health risks of ongoing nicotine use, specifically all types of cancer, heart disease, stroke and respiratory failure. The options available for help with cessation ,the behavioral changes to assist the process, and the option to either gradully reduce usage  Or abruptly stop.is also discussed. Pt is also encouraged to set specific goals in number of cigarettes used daily, as well as to set a quit date.    MCI (mild cognitive impairment) Continued on lower dose of med per preference of her daughter despite the fact that  i have explained that this is not considered an adeqaute dose

## 2013-12-21 DIAGNOSIS — H2523 Age-related cataract, morgagnian type, bilateral: Secondary | ICD-10-CM | POA: Diagnosis not present

## 2013-12-26 ENCOUNTER — Telehealth: Payer: Self-pay | Admitting: *Deleted

## 2013-12-26 NOTE — Telephone Encounter (Signed)
Pt's daughter called requesting a medication refill. [phone]

## 2013-12-26 NOTE — Telephone Encounter (Signed)
Needs a PA- had been faxing it to the wrong place. Will fax again

## 2014-02-06 NOTE — Assessment & Plan Note (Signed)
Weekly fosamax and daily calcium and D supplement as well as weight bearing exercise as able, pt needs cane to safely ambulate so exercise is limited by this

## 2014-02-06 NOTE — Assessment & Plan Note (Signed)
Daily use of inhaler encouraged to assist breathing

## 2014-02-06 NOTE — Assessment & Plan Note (Signed)
Decreased sodium intake and increased intake of fresh/frozen fruit and veg encouraged

## 2014-02-06 NOTE — Assessment & Plan Note (Signed)
Pt and family report less dependence, still smokes however, unwilling to set  Quit date at this time Patient counseled for approximately 5 minutes regarding the health risks of ongoing nicotine use, specifically all types of cancer, heart disease, stroke and respiratory failure. The options available for help with cessation ,the behavioral changes to assist the process, and the option to either gradully reduce usage  Or abruptly stop.is also discussed. Pt is also encouraged to set specific goals in number of cigarettes used daily, as well as to set a quit date.

## 2014-02-06 NOTE — Progress Notes (Signed)
Subjective:    Patient ID: Jennifer [name], female    DOB: 1923-01-03, 81 y.o.   MRN: [id number]  HPI The PT is here for follow up and re-evaluation of chronic medical conditions, medication management and review of any available recent lab and radiology data.  Preventive health is updated, specifically  Cancer screening and Immunization.   Questions or concerns regarding consultations or procedures which the PT has had in the interim are  addressed. The PT denies any adverse reactions to current medications since the last visit.  C/o increased and uncontrolled back and knee pain, wants an alternative to NSAID for management    Review of Systems See HPI Denies recent fever or chills. Denies sinus pressure, nasal congestion, ear pain or sore throat. Denies chest congestion, productive cough has intermittent  wheezing. Denies chest pains, palpitations and leg swelling Denies abdominal pain, nausea, vomiting,diarrhea or constipation.   Denies dysuria, frequency, hesitancy , has  incontinence.  Denies headaches, seizures, numbness, or tingling. Denies depression, anxiety or insomnia. Denies skin break down or rash.        Objective:   Physical Exam  BP 152/76 mmHg  Pulse 76  Resp 18  Ht 5\' 5"  (1.651 m)  Wt 208 lb 0.6 oz (94.366 kg)  BMI 34.62 kg/m2  SpO2 95%  Patient alert and oriented and in no cardiopulmonary distress.  HEENT: No facial asymmetry, EOMI,   oropharynx pink and moist.  Neck decreased ROM no JVD, no mass.TM clear.probap   Chest: Clear to auscultation bilaterally.Decreased air entry throughout though adequate , no crackles or wheezes  CVS: S1, S2 no murmurs, no S3.Regular rate.  ABD: Soft non tender.   Ext: No edema  MS: Decreased  ROM spine, shoulders, hips and knees.  Skin: Intact, no ulcerations or rash noted.  Psych: Good eye contact, normal affect. Memory impaired not anxious or depressed appearing.  CNS: CN 2-12 intact, power,  normal  throughout.no focal deficits noted.       Assessment & Plan:  MCI (mild cognitive impairment) Continue current medication. In past MMSE test was discouraged plan to get repeat in 2015to assess effect of medication as well as disease progression   Osteoporosis, post-menopausal Weekly fosamax and daily calcium and D supplement as well as weight bearing exercise as able, pt needs cane to safely ambulate so exercise is limited by this   TOBACCO ABUSE Pt and family report less dependence, still smokes however, unwilling to set  Quit date at this time Patient counseled for approximately 5 minutes regarding the health risks of ongoing nicotine use, specifically all types of cancer, heart disease, stroke and respiratory failure. The options available for help with cessation ,the behavioral changes to assist the process, and the option to either gradully reduce usage  Or abruptly stop.is also discussed. Pt is also encouraged to set specific goals in number of cigarettes used daily, as well as to set a quit date.    Urinary incontinence Improved with med , continue same   At high risk for falls Home safety reviewed, pt has had no falls in past 12 months and she does use a cane for safety   COPD (chronic obstructive pulmonary disease) Daily use of inhaler encouraged to assist breathing   Back pain with radiation Uncontrolled pain will start gabapentin, NSAIDS to be avoided, tylenol up to 2 gm per day   Elevated systolic blood pressure Decreased sodium intake and increased intake of fresh/frozen fruit and veg encouraged

## 2014-02-06 NOTE — Assessment & Plan Note (Signed)
Improved with med, continue same 

## 2014-02-06 NOTE — Assessment & Plan Note (Signed)
Uncontrolled pain will start gabapentin, NSAIDS to be avoided, tylenol up to 2 gm per day

## 2014-02-06 NOTE — Assessment & Plan Note (Signed)
Continue current medication. In past MMSE test was discouraged plan to get repeat in 2015to assess effect of medication as well as disease progression

## 2014-02-06 NOTE — Assessment & Plan Note (Signed)
Home safety reviewed, pt has had no falls in past 12 months and she does use a cane for safety

## 2014-02-18 NOTE — Assessment & Plan Note (Signed)
Generalized involving primarily spine ands knees. Pain management discussed with pt and her daughter Tylenol and lidoderm are the mainstay, she may use topical anti inflammatory also

## 2014-02-18 NOTE — Assessment & Plan Note (Signed)
Reduced usage but still smoking and unwilling to set a quit date Patient counseled for approximately 5 minutes regarding the health risks of ongoing nicotine use, specifically all types of cancer, heart disease, stroke and respiratory failure. The options available for help with cessation ,the behavioral changes to assist the process, and the option to either gradully reduce usage  Or abruptly stop.is also discussed. Pt is also encouraged to set specific goals in number of cigarettes used daily, as well as to set a quit date.

## 2014-02-18 NOTE — Assessment & Plan Note (Signed)
Continued on lower dose of med per preference of her daughter despite the fact that i have explained that this is not considered an adeqaute dose

## 2014-02-18 NOTE — Assessment & Plan Note (Signed)
Reviewed toileting on a regular schedule, will start vesicare as well to help with control

## 2014-02-18 NOTE — Assessment & Plan Note (Addendum)
Managed by low sodium diet , encouraged to increase vegetable intake, no medciations as systolic is mildly elevated

## 2014-02-18 NOTE — Assessment & Plan Note (Signed)
Worsening due to ongoing nicotine use Patient counseled for approximately 5 minutes regarding the health risks of ongoing nicotine use, specifically all types of cancer, heart disease, stroke and respiratory failure. The options available for help with cessation ,the behavioral changes to assist the process, and the option to either gradully reduce usage  Or abruptly stop.is also discussed. Pt is also encouraged to set specific goals in number of cigarettes used daily, as well as to set a quit date.  

## 2014-03-07 ENCOUNTER — Other Ambulatory Visit: Payer: Self-pay | Admitting: Family Medicine

## 2014-03-19 ENCOUNTER — Other Ambulatory Visit: Payer: Self-pay

## 2014-03-19 DIAGNOSIS — R32 Unspecified urinary incontinence: Secondary | ICD-10-CM

## 2014-03-19 MED ORDER — OXYBUTYNIN CHLORIDE ER 5 MG PO TB24: 5.0000 mg | ORAL_TABLET | Freq: Every day | ORAL | Status: DC

## 2014-04-16 ENCOUNTER — Telehealth: Payer: Self-pay

## 2014-04-16 ENCOUNTER — Other Ambulatory Visit: Payer: Self-pay | Admitting: Family Medicine

## 2014-04-16 DIAGNOSIS — M810 Age-related osteoporosis without current pathological fracture: Secondary | ICD-10-CM

## 2014-04-16 DIAGNOSIS — J449 Chronic obstructive pulmonary disease, unspecified: Secondary | ICD-10-CM | POA: Diagnosis not present

## 2014-04-16 DIAGNOSIS — Z79899 Other long term (current) drug therapy: Secondary | ICD-10-CM

## 2014-04-16 DIAGNOSIS — D539 Nutritional anemia, unspecified: Secondary | ICD-10-CM | POA: Diagnosis not present

## 2014-04-16 LAB — CBC
HCT: 37.4 % (ref 36.0–46.0)
Hemoglobin: 11.8 g/dL — ABNORMAL LOW (ref 12.0–15.0)
MCH: 28.3 pg (ref 26.0–34.0)
MCHC: 31.6 g/dL (ref 30.0–36.0)
MCV: 89.7 fL (ref 78.0–100.0)
MPV: 9.7 fL (ref 8.6–12.4)
PLATELETS: 213 10*3/uL (ref 150–400)
RBC: 4.17 MIL/uL (ref 3.87–5.11)
RDW: 15.2 % (ref 11.5–15.5)
WBC: 3.8 10*3/uL — AB (ref 4.0–10.5)

## 2014-04-16 LAB — LIPID PANEL
Cholesterol: 174 mg/dL (ref 0–200)
HDL: 40 mg/dL — AB (ref >=46)
LDL Cholesterol: 111 mg/dL — ABNORMAL HIGH (ref 0–99)
TRIGLYCERIDES: 115 mg/dL (ref <150)
Total CHOL/HDL Ratio: 4.4 Ratio
VLDL: 23 mg/dL (ref 0–40)

## 2014-04-16 LAB — COMPREHENSIVE METABOLIC PANEL
ALBUMIN: 3.2 g/dL — AB (ref 3.5–5.2)
AST: 12 U/L (ref 0–37)
Alkaline Phosphatase: 124 U/L — ABNORMAL HIGH (ref 39–117)
BILIRUBIN TOTAL: 0.3 mg/dL (ref 0.2–1.2)
BUN: 19 mg/dL (ref 6–23)
CALCIUM: 8.1 mg/dL — AB (ref 8.4–10.5)
CO2: 26 mEq/L (ref 19–32)
Chloride: 109 mEq/L (ref 96–112)
Creat: 0.79 mg/dL (ref 0.50–1.10)
GLUCOSE: 87 mg/dL (ref 70–99)
POTASSIUM: 4.2 meq/L (ref 3.5–5.3)
Sodium: 146 mEq/L — ABNORMAL HIGH (ref 135–145)
Total Protein: 6.4 g/dL (ref 6.0–8.3)

## 2014-04-16 LAB — TSH: TSH: 1.346 u[IU]/mL (ref 0.350–4.500)

## 2014-04-16 NOTE — Telephone Encounter (Signed)
Labs ordered.

## 2014-04-17 LAB — VITAMIN D 25 HYDROXY (VIT D DEFICIENCY, FRACTURES): Vit D, 25-Hydroxy: 13 ng/mL — ABNORMAL LOW (ref 30–100)

## 2014-04-17 LAB — FERRITIN: FERRITIN: 21 ng/mL (ref 10–291)

## 2014-04-17 LAB — VITAMIN B12: VITAMIN B 12: 423 pg/mL (ref 211–911)

## 2014-04-17 LAB — IRON: IRON: 30 ug/dL — AB (ref 42–145)

## 2014-04-22 ENCOUNTER — Emergency Department (HOSPITAL_COMMUNITY): Payer: Medicare Other

## 2014-04-22 ENCOUNTER — Encounter (HOSPITAL_COMMUNITY): Payer: Self-pay | Admitting: Emergency Medicine

## 2014-04-22 ENCOUNTER — Observation Stay (HOSPITAL_COMMUNITY)
Admission: EM | Admit: 2014-04-22 | Discharge: 2014-04-23 | Disposition: A | Discharge status: Home / Self Care | Payer: Medicare Other | Attending: Internal Medicine | Admitting: Internal Medicine

## 2014-04-22 DIAGNOSIS — F17200 Nicotine dependence, unspecified, uncomplicated: Secondary | ICD-10-CM | POA: Diagnosis present

## 2014-04-22 DIAGNOSIS — J449 Chronic obstructive pulmonary disease, unspecified: Secondary | ICD-10-CM | POA: Diagnosis present

## 2014-04-22 DIAGNOSIS — Z880 Allergy status to penicillin: Secondary | ICD-10-CM | POA: Diagnosis not present

## 2014-04-22 DIAGNOSIS — D649 Anemia, unspecified: Secondary | ICD-10-CM | POA: Diagnosis not present

## 2014-04-22 DIAGNOSIS — Z79899 Other long term (current) drug therapy: Secondary | ICD-10-CM | POA: Insufficient documentation

## 2014-04-22 DIAGNOSIS — R011 Cardiac murmur, unspecified: Secondary | ICD-10-CM | POA: Diagnosis not present

## 2014-04-22 DIAGNOSIS — M549 Dorsalgia, unspecified: Secondary | ICD-10-CM | POA: Insufficient documentation

## 2014-04-22 DIAGNOSIS — Z872 Personal history of diseases of the skin and subcutaneous tissue: Secondary | ICD-10-CM | POA: Insufficient documentation

## 2014-04-22 DIAGNOSIS — Z7951 Long term (current) use of inhaled steroids: Secondary | ICD-10-CM | POA: Diagnosis not present

## 2014-04-22 DIAGNOSIS — G8929 Other chronic pain: Secondary | ICD-10-CM | POA: Insufficient documentation

## 2014-04-22 DIAGNOSIS — Z8619 Personal history of other infectious and parasitic diseases: Secondary | ICD-10-CM | POA: Diagnosis not present

## 2014-04-22 DIAGNOSIS — I10 Essential (primary) hypertension: Secondary | ICD-10-CM

## 2014-04-22 DIAGNOSIS — F0390 Unspecified dementia without behavioral disturbance: Secondary | ICD-10-CM | POA: Diagnosis present

## 2014-04-22 DIAGNOSIS — E785 Hyperlipidemia, unspecified: Secondary | ICD-10-CM | POA: Insufficient documentation

## 2014-04-22 DIAGNOSIS — R079 Chest pain, unspecified: Secondary | ICD-10-CM | POA: Diagnosis not present

## 2014-04-22 DIAGNOSIS — K219 Gastro-esophageal reflux disease without esophagitis: Secondary | ICD-10-CM | POA: Diagnosis not present

## 2014-04-22 DIAGNOSIS — Z7982 Long term (current) use of aspirin: Secondary | ICD-10-CM | POA: Insufficient documentation

## 2014-04-22 DIAGNOSIS — I2510 Atherosclerotic heart disease of native coronary artery without angina pectoris: Secondary | ICD-10-CM | POA: Insufficient documentation

## 2014-04-22 DIAGNOSIS — M1990 Unspecified osteoarthritis, unspecified site: Secondary | ICD-10-CM | POA: Diagnosis not present

## 2014-04-22 DIAGNOSIS — J438 Other emphysema: Secondary | ICD-10-CM

## 2014-04-22 DIAGNOSIS — R0602 Shortness of breath: Secondary | ICD-10-CM | POA: Diagnosis not present

## 2014-04-22 DIAGNOSIS — Z720 Tobacco use: Secondary | ICD-10-CM

## 2014-04-22 DIAGNOSIS — R0789 Other chest pain: Secondary | ICD-10-CM | POA: Diagnosis not present

## 2014-04-22 HISTORY — DX: Atherosclerotic heart disease of native coronary artery without angina pectoris: I25.10

## 2014-04-22 LAB — COMPREHENSIVE METABOLIC PANEL
ALT: 9 U/L (ref 0–35)
AST: 14 U/L (ref 0–37)
Albumin: 3.3 g/dL — ABNORMAL LOW (ref 3.5–5.2)
Alkaline Phosphatase: 125 U/L — ABNORMAL HIGH (ref 39–117)
Anion gap: 7 (ref 5–15)
BUN: 14 mg/dL (ref 6–23)
CO2: 28 mmol/L (ref 19–32)
Calcium: 8.2 mg/dL — ABNORMAL LOW (ref 8.4–10.5)
Chloride: 105 mmol/L (ref 96–112)
Creatinine, Ser: 0.84 mg/dL (ref 0.50–1.10)
GFR, EST AFRICAN AMERICAN: 69 mL/min — AB (ref >90)
GFR, EST NON AFRICAN AMERICAN: 59 mL/min — AB (ref >90)
Glucose, Bld: 89 mg/dL (ref 70–99)
Potassium: 3.5 mmol/L (ref 3.5–5.1)
Sodium: 140 mmol/L (ref 135–145)
TOTAL PROTEIN: 6.9 g/dL (ref 6.0–8.3)
Total Bilirubin: 0.3 mg/dL (ref 0.3–1.2)

## 2014-04-22 LAB — CBC
HCT: 36.5 % (ref 36.0–46.0)
Hemoglobin: 11.3 g/dL — ABNORMAL LOW (ref 12.0–15.0)
MCH: 28.6 pg (ref 26.0–34.0)
MCHC: 31 g/dL (ref 30.0–36.0)
MCV: 92.4 fL (ref 78.0–100.0)
PLATELETS: 199 10*3/uL (ref 150–400)
RBC: 3.95 MIL/uL (ref 3.87–5.11)
RDW: 14.5 % (ref 11.5–15.5)
WBC: 4.3 10*3/uL (ref 4.0–10.5)

## 2014-04-22 LAB — I-STAT TROPONIN, ED: Troponin i, poc: 0 ng/mL (ref 0.00–0.08)

## 2014-04-22 LAB — TROPONIN I: Troponin I: <0.03 ng/mL (ref <0.031)

## 2014-04-22 MED ORDER — DONEPEZIL HCL 5 MG PO TABS
5.0000 mg | ORAL_TABLET | Freq: Every day | ORAL | Status: DC
  Administered 2014-04-22: 5 mg via ORAL
  Filled 2014-04-22: qty 1

## 2014-04-22 MED ORDER — BUDESONIDE-FORMOTEROL FUMARATE 160-4.5 MCG/ACT IN AERO
INHALATION_SPRAY | RESPIRATORY_TRACT | Status: AC
  Filled 2014-04-22: qty 6

## 2014-04-22 MED ORDER — OXYBUTYNIN CHLORIDE ER 5 MG PO TB24
5.0000 mg | ORAL_TABLET | Freq: Every day | ORAL | Status: DC
  Administered 2014-04-22: 5 mg via ORAL
  Filled 2014-04-22 (×3): qty 1

## 2014-04-22 MED ORDER — ONDANSETRON HCL 4 MG/2ML IJ SOLN: 4.0000 mg | Freq: Four times a day (QID) | INTRAMUSCULAR | Status: DC | PRN

## 2014-04-22 MED ORDER — GABAPENTIN 300 MG PO CAPS
300.0000 mg | ORAL_CAPSULE | Freq: Two times a day (BID) | ORAL | Status: DC
  Administered 2014-04-22 – 2014-04-23 (×2): 300 mg via ORAL
  Filled 2014-04-22 (×2): qty 1

## 2014-04-22 MED ORDER — ASPIRIN EC 325 MG PO TBEC: 325.0000 mg | DELAYED_RELEASE_TABLET | Freq: Every day | ORAL | Status: DC

## 2014-04-22 MED ORDER — BUDESONIDE-FORMOTEROL FUMARATE 160-4.5 MCG/ACT IN AERO
2.0000 | INHALATION_SPRAY | Freq: Two times a day (BID) | RESPIRATORY_TRACT | Status: DC
  Administered 2014-04-22 – 2014-04-23 (×2): 2 via RESPIRATORY_TRACT
  Filled 2014-04-22: qty 6

## 2014-04-22 MED ORDER — HEPARIN SODIUM (PORCINE) 5000 UNIT/ML IJ SOLN
5000.0000 [IU] | Freq: Three times a day (TID) | INTRAMUSCULAR | Status: DC
Start: 2014-04-22 — End: 2014-04-22

## 2014-04-22 MED ORDER — SODIUM CHLORIDE 0.9 % IV SOLN
INTRAVENOUS | Status: DC
  Administered 2014-04-22: 17:00:00 via INTRAVENOUS

## 2014-04-22 MED ORDER — ACETAMINOPHEN 325 MG PO TABS
650.0000 mg | ORAL_TABLET | ORAL | Status: DC | PRN
  Administered 2014-04-22: 650 mg via ORAL
  Filled 2014-04-22: qty 2

## 2014-04-22 MED ORDER — HEPARIN SODIUM (PORCINE) 5000 UNIT/ML IJ SOLN
5000.0000 [IU] | Freq: Three times a day (TID) | INTRAMUSCULAR | Status: DC
  Administered 2014-04-22 – 2014-04-23 (×2): 5000 [IU] via SUBCUTANEOUS
  Filled 2014-04-22 (×2): qty 1

## 2014-04-22 NOTE — ED Notes (Signed)
Report given to [name], RN on [phone] 

## 2014-04-22 NOTE — ED Notes (Signed)
Patient c/o mid-sternal chest pain that radiates into back. Patient reports shortness of breath. Denies taking any medication for pain. Patient reports being seen here before for chest pain but no other cardiac hx.

## 2014-04-22 NOTE — Progress Notes (Addendum)
Patient has short term memory loss and does not realize she takes inhalers. She denied use but will take. Family gives at home. Inhaler given with spacer.

## 2014-04-22 NOTE — ED Notes (Addendum)
Pt and family updated, pt sleeping, repositioned, comfort improved,

## 2014-04-22 NOTE — ED Notes (Signed)
Pt states that while she was at church she started have mid center sharp chest pain that radiated to back area with weakness. Pt states that she felt weak with the pain, upon arrival to er pt states that most of the pain is gone, Pt NSR on monitor, states that she has had pain like this before when she had her heart attack years ago,

## 2014-04-22 NOTE — ED Notes (Signed)
Pt assisted to bedpan, urinated in bed, bed linen changed, update given, pt denies any pain,

## 2014-04-22 NOTE — ED Provider Notes (Signed)
CSN: [id number]     Arrival date & time 04/22/14  1114 History   This chart was scribed for Bethann BerkshireJoseph Kharter Brew, MD by Abel PrestoKara Demonbreun, ED Scribe. This patient was seen in room APA01/APA01 and the patient's care was started at 12:22 PM.     Chief Complaint  Patient presents with  . Chest Pain     Patient is a 81 y.o. female presenting with chest pain. The history is provided by a relative and the patient. No language interpreter was used.  Chest Pain Pain quality: sharp   Pain radiates to:  Upper back Pain radiates to the back: yes   Pain severity:  Moderate Onset quality:  Sudden Duration:  1 day Timing:  Intermittent Progression:  Worsening Relieved by:  Nothing Worsened by:  Nothing tried Ineffective treatments:  None tried Associated symptoms: back pain   Associated symptoms: no abdominal pain, no cough, no diaphoresis, no fatigue, no headache and no shortness of breath   Risk factors: high cholesterol    HPI Comments: Jennifer [name] is a 81 y.o. female with PMHx of COPD, HLD, GERD, and chronic back pain, who presents to the Emergency Department complaining of on intermittent sharp chest pain with onset yesterday, lasting less than a minute. Pt states pain has resolved. Pt notes pain radiates to her back. She notes associated fatigue. Pt with h/o MI. Pt denies diaphoresis and SOB.   Past Medical History  Diagnosis Date  . Chest pain 2006  . COPD (chronic obstructive pulmonary disease)   . Hyperlipidemia   . Osteoarthritis     status post left TKA; surgery on the right is anticipated in the near future   . Nicotine addiction   . Gastroesophageal reflux disease   . Hiatal hernia   . Anemia     iron deficiency post op.  . Chronic back pain   . Pulmonary nodules     stable since 2006  . Cellulitis of finger     right   . Shingles     right breast   . Ulcer     gential  . Skin infection    Past Surgical History  Procedure Laterality Date  . Cholecystectomy    .  Combined hysterectomy abdominal w/ a&p repair / oophorectomy  approx. 40 years ago   . Mastectomy  1998 & 2007     right -1998 / left 2007  . Total knee arthroplasty  4/08    left   . Right knee replacement  09/2009    Dr. Romeo AppleHarrison  . Abdominal hysterectomy    . Breast surgery N/A     bilateral   Family History  Problem Relation Age of Onset  . Cancer Mother [id number]    breast   . Cancer Father [id number]    prostate  . Hypertension Father   . Hypertension Sister   . Asthma Sister    History  Substance Use Topics  . Smoking status: Current Every Day Smoker -- 0.50 packs/day    Types: Cigarettes  . Smokeless tobacco: Never Used  . Alcohol Use: No   OB History    Gravida Para Term Preterm AB TAB SAB Ectopic Multiple Living            1     Review of Systems  Constitutional: Negative for diaphoresis, appetite change and fatigue.  HENT: Negative for congestion, ear discharge and sinus pressure.   Eyes: Negative for discharge.  Respiratory: Negative for cough and shortness of  breath.   Cardiovascular: Positive for chest pain.  Gastrointestinal: Negative for abdominal pain and diarrhea.  Genitourinary: Negative for frequency and hematuria.  Musculoskeletal: Positive for back pain.  Skin: Negative for rash.  Neurological: Negative for seizures and headaches.  Psychiatric/Behavioral: Negative for hallucinations.      Allergies  Iohexol; Iron; and Penicillins  Home Medications   Prior to Admission medications   Medication Sig Start Date End Date Taking? Authorizing Provider  acetaminophen (TYLENOL) 500 MG tablet Take 1,000 mg by mouth every 6 (six) hours as needed for mild pain.   Yes Historical Provider, MD  budesonide-formoterol (SYMBICORT) 160-4.5 MCG/ACT inhaler Inhale 2 puffs into the lungs 2 (two) times daily. 05/29/13  Yes [name], MD  donepezil (ARICEPT) 5 MG tablet TAKE 1 TABLET BY MOUTH EVERY NIGHT AT BEDTIME 05/18/13  Yes [name], MD  gabapentin  (NEURONTIN) 300 MG capsule TAKE 1 CAPSULE BY MOUTH EVERY MORNING, THEN TAKE 2 CAPSULES BY MOUTH EVERY NIGHT AT BEDTIME 03/07/14  Yes [name], MD  lidocaine (LIDODERM) 5 % Place 3 patches onto the skin daily. Remove & Discard patch within 12 hours or as directed by MD. *Patient states that these are applied to wrist, leg, and back* 12/13/13  Yes [name], MD  oxybutynin (DITROPAN-XL) 5 MG 24 hr tablet Take 1 tablet (5 mg total) by mouth at bedtime. 03/19/14  Yes [name], MD  alendronate (FOSAMAX) 70 MG tablet Take 1 tablet (70 mg total) by mouth every 7 (seven) days. Take with a full glass of water on an empty stomach. Patient not taking: Reported on 04/22/2014 04/05/13   [name], MD  aspirin EC 81 MG tablet Take 1 tablet (81 mg total) by mouth daily. Patient not taking: Reported on 04/22/2014 04/04/13   [name], MD  calcium-vitamin D (OSCAL 500/200 D-3) 500-200 MG-UNIT per tablet Take 1 tablet by mouth 2 (two) times daily. Patient not taking: Reported on 04/22/2014 04/04/13   [name], MD  Vitamin D, Ergocalciferol, (DRISDOL) 50000 UNITS CAPS capsule Take 1 capsule (50,000 Units total) by mouth every 7 (seven) days. Patient not taking: Reported on 04/22/2014 05/29/13   [name], MD   BP 146/52 mmHg  Pulse 77  Temp(Src) 97.7 F (36.5 C) (Oral)  Resp 16  Ht  (1.676 m)  Wt 208 lb (94.348 kg)  BMI 33.59 kg/m2  SpO2 96% Physical Exam  Constitutional: She is oriented to person, place, and time. She appears well-developed.  HENT:  Head: Normocephalic.  Eyes: Conjunctivae and EOM are normal. No scleral icterus.  Neck: Neck supple. No thyromegaly present.  Cardiovascular: Normal rate and regular rhythm.  Exam reveals no friction rub.   Murmur (2/6 systolic) heard. Pulmonary/Chest: No stridor. She has no wheezes. She has no rales. She exhibits no tenderness.  Abdominal: She exhibits no distension. There is no tenderness. There is  no rebound.  Musculoskeletal: Normal range of motion. She exhibits no edema.  Lymphadenopathy:    She has no cervical adenopathy.  Neurological: She is alert and oriented to person, place, and time. She exhibits normal muscle tone. Coordination normal.  Skin: No rash noted. No erythema.  Psychiatric: She has a normal mood and affect. Her behavior is normal.  Nursing note and vitals reviewed.   ED Course  Procedures (including critical care time) DIAGNOSTIC STUDIES: Oxygen Saturation is 96% on room air, normal by my interpretation.    COORDINATION OF CARE: 12:25  PM Discussed treatment plan with patient at beside, the patient agrees with the plan and has no further questions at this time.   Labs Review Labs Reviewed  CBC - Abnormal; Notable for the following:    Hemoglobin 11.3 (*)    All other components within normal limits  COMPREHENSIVE METABOLIC PANEL  URINALYSIS, ROUTINE W REFLEX MICROSCOPIC  I-STAT TROPOININ, ED    Imaging Review No results found.   EKG Interpretation   Date/Time:  Sunday April 22 2014 11:25:11 EDT Ventricular Rate:  69 PR Interval:  189 QRS Duration: 74 QT Interval:  424 QTC Calculation: 454 R Axis:   37 Text Interpretation:  Sinus rhythm Anteroseptal infarct, old Nonspecific T  abnormalities, lateral leads Baseline wander in lead(s) V6 Confirmed by  Kadynce Bonds  MD, Hakeem Frazzini [phone]) on 04/22/2014 2:14:13 PM      MDM   Final diagnoses:  None   Chest pain,  Admit to obs   The chart was scribed for me under my direct supervision.  I personally performed the history, physical, and medical decision making and all procedures in the evaluation of this patient.Bethann Berkshire, MD 04/22/14 [phone]

## 2014-04-22 NOTE — H&P (Signed)
[facility]          History and Physical    PCP:   Tula Nakayama, MD   Chief Complaint:  Chest pain  HPI: Patient is a 81 year old woman with past medical history significant for hypertension, COPD, mild cognitive impairment, and tobacco abuse who presents to the hospital today with the above-mentioned complaints. Patient states that she had just arrived to church was sitting on the bench with sudden she developed pain that she describes as between her breasts shooting straight back. She did not become diaphoretic, short of breath or have palpitations. Pain lasted a few seconds. It recurred again a few minutes later and this time she told her daughter about it who promptly brought her to the hospital for evaluation. I have independently reviewed her lab tests, EKGs and chest x-ray, they are notable for no airspace consolidation or CHF on her chest x-ray, EKG with normal sinus rhythm and old septal infarct, however no acute ST or T-wave changes. I have also extensively reviewed her past records and note that in 2008 she complained of chest pain and had a cardiac catheterization that showed nonobstructive coronary disease with 40% mid left anterior descending stenosis and the distal 25% RCA lesion. She also had a nuclear stress test in late 2008 that was also normal. We have been asked to admit her for further evaluation and management.  Allergies:   Allergies  Allergen Reactions  . Iohexol      Desc: PT ALLERGIC TO CONTRAST- SHE CAN'T BREATH   . Iron   . Penicillins       Past Medical History  Diagnosis Date  . Chest pain 2006  . COPD (chronic obstructive pulmonary disease)   . Hyperlipidemia   . Osteoarthritis     status post left TKA; surgery on the right is anticipated in the near future   . Nicotine addiction   . Gastroesophageal reflux disease   . Hiatal hernia   . Anemia     iron deficiency post op.  . Chronic back pain   . Pulmonary nodules     stable since 2006  . Cellulitis of finger     right   . Shingles     right breast   . Ulcer     gential  . Skin infection   . HTN (hypertension) 04/22/2014    Past Surgical History  Procedure Laterality Date  . Cholecystectomy    . Combined hysterectomy abdominal w/ a&p repair / oophorectomy  approx. 40 years ago   . Mastectomy  1998 & 2007     right -1998 / left 2007  . Total knee arthroplasty  4/08    left   . Right knee replacement  09/2009    Dr. Aline Brochure  . Abdominal hysterectomy    . Breast surgery N/A     bilateral    Prior to Admission medications   Medication Sig Start Date End Date Taking? Authorizing Provider  acetaminophen (TYLENOL) 500 MG tablet Take 1,000 mg by mouth every 6 (six) hours as needed for mild pain.   Yes Historical Provider, MD  budesonide-formoterol (SYMBICORT) 160-4.5 MCG/ACT inhaler Inhale 2 puffs into the lungs 2 (two) times daily. 05/29/13  Yes Fayrene Helper, MD  donepezil (ARICEPT) 5 MG tablet TAKE 1 TABLET BY MOUTH EVERY NIGHT AT BEDTIME 05/18/13  Yes Fayrene Helper, MD  gabapentin (NEURONTIN) 300 MG capsule TAKE  1 CAPSULE BY MOUTH EVERY MORNING, THEN TAKE 2 CAPSULES BY MOUTH EVERY NIGHT AT BEDTIME 03/07/14  Yes Fayrene Helper, MD  lidocaine (LIDODERM) 5 % Place 3 patches onto the skin daily. Remove & Discard patch within 12 hours or as directed by MD. *Patient states that these are applied to wrist, leg, and back* 12/13/13  Yes Fayrene Helper, MD  oxybutynin (DITROPAN-XL) 5 MG 24 hr tablet Take 1 tablet (5 mg total) by mouth at bedtime. 03/19/14  Yes Fayrene Helper, MD  alendronate (FOSAMAX) 70 MG tablet Take 1 tablet (70 mg total) by mouth every 7 (seven) days. Take with a full glass of water on an empty stomach. Patient not taking: Reported on 04/22/2014 04/05/13   Fayrene Helper, MD  aspirin EC 81 MG tablet Take 1 tablet (81 mg total) by mouth daily. Patient not taking: Reported on 04/22/2014 04/04/13   Fayrene Helper, MD    calcium-vitamin D (OSCAL 500/200 D-3) 500-200 MG-UNIT per tablet Take 1 tablet by mouth 2 (two) times daily. Patient not taking: Reported on 04/22/2014 04/04/13   Fayrene Helper, MD  Vitamin D, Ergocalciferol, (DRISDOL) 50000 UNITS CAPS capsule Take 1 capsule (50,000 Units total) by mouth every 7 (seven) days. Patient not taking: Reported on 04/22/2014 05/29/13   Fayrene Helper, MD    Social History:  reports that she has been smoking Cigarettes.  She has been smoking about 0.50 packs per day. She has never used smokeless tobacco. She reports that she does not drink alcohol or use illicit drugs.  Family History  Problem Relation Age of Onset  . Cancer Mother 95    breast   . Cancer Father 22    prostate  . Hypertension Father   . Hypertension Sister   . Asthma Sister     Review of Systems:  Constitutional: Denies fever, chills, diaphoresis, appetite change and fatigue.  HEENT: Denies photophobia, eye pain, redness, hearing loss, ear pain, congestion, sore throat, rhinorrhea, sneezing, mouth sores, trouble swallowing, neck pain, neck stiffness and tinnitus.   Respiratory: Denies SOB, DOE, cough,  and wheezing.   Cardiovascular: Deniespalpitations and leg swelling.  Gastrointestinal: Denies nausea, vomiting, abdominal pain, diarrhea, constipation, blood in stool and abdominal distention.  Genitourinary: Denies dysuria, urgency, frequency, hematuria, flank pain and difficulty urinating.  Endocrine: Denies: hot or cold intolerance, sweats, changes in hair or nails, polyuria, polydipsia. Musculoskeletal: Denies myalgias, back pain, joint swelling, arthralgias and gait problem.  Skin: Denies pallor, rash and wound.  Neurological: Denies dizziness, seizures, syncope, weakness, light-headedness, numbness and headaches.  Hematological: Denies adenopathy. Easy bruising, personal or family bleeding history  Psychiatric/Behavioral: Denies suicidal ideation, mood changes, confusion,  nervousness, sleep disturbance and agitation   Physical Exam: Blood pressure 157/77, pulse 55, temperature 97.9 F (36.6 C), temperature source Oral, resp. rate 22, height 5' 6"  (1.676 m), weight 94.348 kg (208 lb), SpO2 100 %. General: Alert, awake, oriented 3, no current distress. HEENT: Normocephalic, atraumatic, pupils equal round reactive to light, extraocular movements intact, moist mucous membranes, mild pharyngeal erythema. Neck: Supple, no JVD, no lymphadenopathy, no bruits, no goiter. Cardiovascular: Regular rate and rhythm, no murmurs, rubs or gallops. Lungs: Clear to auscultation bilaterally. Abdomen: Soft, nontender, nondistended, positive bowel sounds, no masses or organomegaly noted. Extremities: No clubbing, cyanosis, 1+ pitting edema bilaterally. Neurologic: Grossly intact and nonfocal although I have not ambulated her.  Labs on Admission:  Results for orders placed or performed during the hospital encounter of 04/22/14 (from  the past 48 hour(s))  I-Stat Troponin, ED (not at [facility])     Status: None   Collection Time: 04/22/14 11:37 AM  Result Value Ref Range   Troponin i, poc 0.00 0.00 - 0.08 ng/mL   Comment 3            Comment: Due to the release kinetics of cTnI, a negative result within the first hours of the onset of symptoms does not rule out myocardial infarction with certainty. If myocardial infarction is still suspected, repeat the test at appropriate intervals.   Comprehensive metabolic panel     Status: Abnormal   Collection Time: 04/22/14 11:45 AM  Result Value Ref Range   Sodium 140 135 - 145 mmol/L   Potassium 3.5 3.5 - 5.1 mmol/L   Chloride 105 96 - 112 mmol/L   CO2 28 19 - 32 mmol/L   Glucose, Bld 89 70 - 99 mg/dL   BUN 14 6 - 23 mg/dL   Creatinine, Ser 0.84 0.50 - 1.10 mg/dL   Calcium 8.2 (L) 8.4 - 10.5 mg/dL   Total Protein 6.9 6.0 - 8.3 g/dL   Albumin 3.3 (L) 3.5 - 5.2 g/dL   AST 14 0 - 37 U/L   ALT 9 0 - 35 U/L   Alkaline Phosphatase 125  (H) 39 - 117 U/L   Total Bilirubin 0.3 0.3 - 1.2 mg/dL   GFR calc non Af Amer 59 (L) >90 mL/min   GFR calc Af Amer 69 (L) >90 mL/min    Comment: (NOTE) The eGFR has been calculated using the CKD EPI equation. This calculation has not been validated in all clinical situations. eGFR's persistently <90 mL/min signify possible Chronic Kidney Disease.    Anion gap 7 5 - 15  CBC     Status: Abnormal   Collection Time: 04/22/14 11:45 AM  Result Value Ref Range   WBC 4.3 4.0 - 10.5 K/uL   RBC 3.95 3.87 - 5.11 MIL/uL   Hemoglobin 11.3 (L) 12.0 - 15.0 g/dL   HCT 36.5 36.0 - 46.0 %   MCV 92.4 78.0 - 100.0 fL   MCH 28.6 26.0 - 34.0 pg   MCHC 31.0 30.0 - 36.0 g/dL   RDW 14.5 11.5 - 15.5 %   Platelets 199 150 - 400 K/uL    Radiological Exams on Admission: Dg Chest Portable 1 View  04/22/2014   CLINICAL DATA:  Chest pain and shortness of Breath  EXAM: PORTABLE CHEST - 1 VIEW  COMPARISON:  None  FINDINGS: Mild cardiac enlargement. No pleural effusion or edema identified. Diffuse coarsened interstitial markings are identified bilaterally. No superimposed airspace consolidation.  IMPRESSION: 1. Chronic interstitial coarsening is identified bilaterally. No superimposed airspace consolidation or evidence of CHF.   Electronically Signed   By: Kerby Moors M.D.   On: 04/22/2014 13:03    Assessment/Plan Principal Problem:   Chest pain Active Problems:   TOBACCO ABUSE   COPD (chronic obstructive pulmonary disease)   MCI (mild cognitive impairment)   HTN (hypertension)   Chest pain -Very atypical in nature. -Given her heart score of 4, will agree to admit as an observation for serial troponins to rule out acute coronary syndrome. -As long as she rules out, in light of her advanced age and nonobstructive disease on cardiac catheterization 8 years ago, will elect to discharge her home in the morning, without further cardiac workup. -If she develops elevated troponins, will consider cardiology  evaluation. -Most recent echo on file is from  2006 that shows an ejection fraction of 65-75% with no wall motion abnormalities and no signs of diastolic dysfunction. Will elect to not repeat echo at this time.  Tobacco abuse -Counseled on cessation.  COPD -Well-controlled at present. -Continue home Symbicort.  Hypertension -Well-controlled, continue home medications.  DVT prophylaxis -Subcutaneous heparin. Next  CODE STATUS -Full code at present.   Time Spent on Admission: 90 minutes  [facility] Pager: [phone] 04/22/2014, 4:16 PM

## 2014-04-22 NOTE — ED Notes (Signed)
Pt and family updated, pt requesting coffee, Dr Estell HarpinZammit notified, orders that decaf coffee can be given to pt,  Pt given decaf coffee, family mbr declined offer of any beverage,

## 2014-04-22 NOTE — ED Notes (Signed)
Dr Zammit at bedside,  

## 2014-04-23 ENCOUNTER — Encounter (HOSPITAL_COMMUNITY): Payer: Self-pay | Admitting: Adult Health

## 2014-04-23 DIAGNOSIS — M1990 Unspecified osteoarthritis, unspecified site: Secondary | ICD-10-CM | POA: Diagnosis not present

## 2014-04-23 DIAGNOSIS — R072 Precordial pain: Secondary | ICD-10-CM | POA: Diagnosis not present

## 2014-04-23 DIAGNOSIS — J438 Other emphysema: Secondary | ICD-10-CM | POA: Diagnosis not present

## 2014-04-23 DIAGNOSIS — G3184 Mild cognitive impairment, so stated: Secondary | ICD-10-CM

## 2014-04-23 DIAGNOSIS — R079 Chest pain, unspecified: Secondary | ICD-10-CM | POA: Diagnosis not present

## 2014-04-23 DIAGNOSIS — I10 Essential (primary) hypertension: Secondary | ICD-10-CM | POA: Diagnosis not present

## 2014-04-23 DIAGNOSIS — J449 Chronic obstructive pulmonary disease, unspecified: Secondary | ICD-10-CM | POA: Diagnosis not present

## 2014-04-23 DIAGNOSIS — E785 Hyperlipidemia, unspecified: Secondary | ICD-10-CM | POA: Diagnosis not present

## 2014-04-23 LAB — TROPONIN I
Troponin I: <0.03 ng/mL (ref <0.031)
Troponin I: <0.03 ng/mL (ref <0.031)

## 2014-04-23 MED ORDER — LORAZEPAM 1 MG PO TABS
1.0000 mg | ORAL_TABLET | Freq: Once | ORAL | Status: AC
  Administered 2014-04-23: 1 mg via ORAL
  Filled 2014-04-23: qty 1

## 2014-04-23 NOTE — Progress Notes (Signed)
Discharge instructions explained to patient and family. Verbalized understanding. No further questions or concerns from family or patient. Patient stable at discharge. Patient escorted to main entrance via wheelchair by nurse.

## 2014-04-23 NOTE — Discharge Summary (Signed)
Physician Discharge Summary  Jennifer BeetsVelma M [name] JXB:[id number] DOB: [birth year] DOA: 04/22/2014  PCP: Syliva OvermanMargaret Simpson, MD  Admit date: 04/22/2014 Discharge date: 04/23/2014  Time spent: 45 minutes  Recommendations for Outpatient Follow-up:  -Will be discharged home today. -Advised to follow-up with her PCP, Dr. Lodema HongSimpson, in 2 weeks.   Discharge Diagnoses:  Principal Problem:   Chest pain Active Problems:   TOBACCO ABUSE   COPD (chronic obstructive pulmonary disease)   MCI (mild cognitive impairment)   HTN (hypertension)   Discharge Condition: Stable and improved  Filed Weights   04/22/14 1126 04/22/14 1559  Weight: 94.348 kg (208 lb) 94.348 kg (208 lb)    History of present illness:  Patient is a 81 year old woman with past medical history significant for hypertension, COPD, mild cognitive impairment, and tobacco abuse who presents to the hospital today with the above-mentioned complaints. Patient states that she had just arrived to church was sitting on the bench with sudden she developed pain that she describes as between her breasts shooting straight back. She did not become diaphoretic, short of breath or have palpitations. Pain lasted a few seconds. It recurred again a few minutes later and this time she told her daughter about it who promptly brought her to the hospital for evaluation. I have independently reviewed her lab tests, EKGs and chest x-ray, they are notable for no airspace consolidation or CHF on her chest x-ray, EKG with normal sinus rhythm and old septal infarct, however no acute ST or T-wave changes. I have also extensively reviewed her past records and note that in 2008 she complained of chest pain and had a cardiac catheterization that showed nonobstructive coronary disease with 40% mid left anterior descending stenosis and the distal 25% RCA lesion. She also had a nuclear stress test in late 2008 that was also normal. We were asked to admit her for further  evaluation and management.   Hospital Course:   Chest pain -No recurrence of symptoms since admission. -Has ruled out for acute coronary syndrome with 3 sets of negative troponins and EKG that does not show any acute ischemic abnormalities. -No further cardiac workup planned for at present given her advanced age and nonobstructive disease on cardiac cath in 2008. -Continue medical management and follow-up with outpatient provider.  Tobacco abuse -Counseled on cessation.  COPD -Well-controlled. -Continue Symbicort and when necessary albuterol.  Hypertension -Well-controlled. -Continue current medications.  Procedures:  None   Consultations:  None  Discharge Instructions  Discharge Instructions    Diet - low sodium heart healthy    Complete by:  As directed      Increase activity slowly    Complete by:  As directed             Medication List    STOP taking these medications        alendronate 70 MG tablet  Commonly known as:  FOSAMAX      TAKE these medications        acetaminophen 500 MG tablet  Commonly known as:  TYLENOL  Take 1,000 mg by mouth every 6 (six) hours as needed for mild pain.     aspirin EC 81 MG tablet  Take 1 tablet (81 mg total) by mouth daily.     budesonide-formoterol 160-4.5 MCG/ACT inhaler  Commonly known as:  SYMBICORT  Inhale 2 puffs into the lungs 2 (two) times daily.     calcium-vitamin D 500-200 MG-UNIT per tablet  Commonly known as:  OSCAL 500/200  D-3  Take 1 tablet by mouth 2 (two) times daily.     donepezil 5 MG tablet  Commonly known as:  ARICEPT  TAKE 1 TABLET BY MOUTH EVERY NIGHT AT BEDTIME     gabapentin 300 MG capsule  Commonly known as:  NEURONTIN  TAKE 1 CAPSULE BY MOUTH EVERY MORNING, THEN TAKE 2 CAPSULES BY MOUTH EVERY NIGHT AT BEDTIME     lidocaine 5 %  Commonly known as:  LIDODERM  Place 3 patches onto the skin daily. Remove & Discard patch within 12 hours or as directed by MD. *Patient states that  these are applied to wrist, leg, and back*     oxybutynin 5 MG 24 hr tablet  Commonly known as:  DITROPAN-XL  Take 1 tablet (5 mg total) by mouth at bedtime.     Vitamin D (Ergocalciferol) 50000 UNITS Caps capsule  Commonly known as:  DRISDOL  Take 1 capsule (50,000 Units total) by mouth every 7 (seven) days.       Allergies  Allergen Reactions  . Iohexol      Desc: PT ALLERGIC TO CONTRAST- SHE CAN'T BREATH   . Iron   . Penicillins        Follow-up Information    Follow up with Syliva Overman, MD. Schedule an appointment as soon as possible for a visit in 2 weeks.   Specialty:  Family Medicine   Contact information:   [address], [address] [place] [address] [phone]        The results of significant diagnostics from this hospitalization (including imaging, microbiology, ancillary and laboratory) are listed below for reference.    Significant Diagnostic Studies: Dg Chest Portable 1 View  04/22/2014   CLINICAL DATA:  Chest pain and shortness of Breath  EXAM: PORTABLE CHEST - 1 VIEW  COMPARISON:  None  FINDINGS: Mild cardiac enlargement. No pleural effusion or edema identified. Diffuse coarsened interstitial markings are identified bilaterally. No superimposed airspace consolidation.  IMPRESSION: 1. Chronic interstitial coarsening is identified bilaterally. No superimposed airspace consolidation or evidence of CHF.   Electronically Signed   By: Signa Kell M.D.   On: 04/22/2014 13:03    Microbiology: No results found for this or any previous visit (from the past 240 hour(s)).   Labs: Basic Metabolic Panel:  Recent Labs Lab 04/16/14 1031 04/22/14 1145  NA 146* 140  K 4.2 3.5  CL 109 105  CO2 26 28  GLUCOSE 87 89  BUN 19 14  CREATININE 0.79 0.84  CALCIUM 8.1* 8.2*   Liver Function Tests:  Recent Labs Lab 04/16/14 1031 04/22/14 1145  AST 12 14  ALT <8 9  ALKPHOS 124* 125*  BILITOT 0.3 0.3  PROT 6.4 6.9  ALBUMIN 3.2* 3.3*   No results  for input(s): LIPASE, AMYLASE in the last 168 hours. No results for input(s): AMMONIA in the last 168 hours. CBC:  Recent Labs Lab 04/16/14 1031 04/22/14 1145  WBC 3.8* 4.3  HGB 11.8* 11.3*  HCT 37.4 36.5  MCV 89.7 92.4  PLT 213 199   Cardiac Enzymes:  Recent Labs Lab 04/22/14 1707 04/22/14 2349 04/23/14 0535  TROPONINI <0.03 <0.03 <0.03   BNP: BNP (last 3 results) No results for input(s): BNP in the last 8760 hours.  ProBNP (last 3 results) No results for input(s): PROBNP in the last 8760 hours.  CBG: No results for input(s): GLUCAP in the last 168 hours.     Signed:  Chaya Jan  [facility]  Pager: [phone] 04/23/2014, 9:59 AM

## 2014-04-23 NOTE — Progress Notes (Signed)
UR completed 

## 2014-04-24 ENCOUNTER — Encounter: Payer: Self-pay | Admitting: Family Medicine

## 2014-04-24 ENCOUNTER — Ambulatory Visit (INDEPENDENT_AMBULATORY_CARE_PROVIDER_SITE_OTHER): Payer: Medicare Other | Admitting: Family Medicine

## 2014-04-24 VITALS — BP 142/78 | HR 83 | Resp 16 | Ht 64.5 in | Wt 213.0 lb

## 2014-04-24 DIAGNOSIS — Z66 Do not resuscitate: Secondary | ICD-10-CM

## 2014-04-24 DIAGNOSIS — Z0000 Encounter for general adult medical examination without abnormal findings: Secondary | ICD-10-CM

## 2014-04-24 DIAGNOSIS — G3184 Mild cognitive impairment, so stated: Secondary | ICD-10-CM

## 2014-04-24 MED ORDER — DONEPEZIL HCL 5 MG PO TABS: ORAL_TABLET | ORAL | Status: DC

## 2014-04-24 MED ORDER — GABAPENTIN 300 MG PO CAPS: ORAL_CAPSULE | ORAL | Status: DC

## 2014-04-24 MED ORDER — ALENDRONATE SODIUM 70 MG/75ML PO SOLN: 70.0000 mg | ORAL | Status: DC

## 2014-04-24 NOTE — Assessment & Plan Note (Addendum)
Annual exam as documented. Counseling done  re healthy lifestyle involving commitment to regular exercise as able, nicotine cessation, heart healthy diet, .The importance of adequate sleep also discussed. Regular seat belt use and home safety, is also discussed. . Immunization  needs are specifically addressed at this visit.Reportedly had prevnar at the pharmacy , will follow up  End of life issus discussed with both the patient and her daughter who cares for her. [name] states clearly that she does not want cardiopulmonary resuscitation in the event of an arrest, states she is "looking forward to, and ready for her home going" Daughter present , slightly tearful, but fully in agreement with er mother's decision, and stated that [name], had up to 1 week ago called for her grandson, and stated that she felt she was "on her way out" and that she was at peace with this and that she loved them bot, her daughter and her grandson. She does  Have one other child , as son, who pt's daughter states would have little input into decision making. DNR form completed and signed at visit and given to patient's daughter for her to keep at home

## 2014-04-24 NOTE — Patient Instructions (Addendum)
F/u in  4 month, call if you need me before  Resume weekly fosamax for bone health  Try to reduce cigarettes  Continue to enjoy life with a thankful spirit  Start once weekly vitamin D  Be careful not to fall as you have been  DNR discussion today , and form completed  And returned  Hope you can get your hearing aid, call if you need help with this  We will get prevnar info from pharmacy

## 2014-04-24 NOTE — Progress Notes (Signed)
Subjective:    Patient ID: Jennifer [name], female    DOB: 30-Aug-1922, 81 y.o.   MRN: [id number]  HPI Preventive Screening-Counseling & Management   Patient present here today for a Medicare annual wellness visit.   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 No  Over the past two weeks, have you felt little interest or pleasure in doing things? No  Have you lost interest or pleasure in daily life? No  Do you often feel hopeless? No  Do you cry easily over simple problems? No   Activities of Daily Living  In your present state of health, do you have any difficulty performing the following activities?  Driving?: yes Managing money?: yes , daughter takes care of this Feeding yourself?:No Getting from bed to chair? Uses cane on a regular basis  Climbing a flight of stairs?: cannot climb steps due to her bad knees  Preparing food and eating?:No Bathing or showering? Uses shower chair and handles  Getting dressed?:No Getting to the toilet?:No Using the toilet?:No Moving around from place to place?: uses cane   Fall Risk Assessment In the past year have you fallen or had a near fall?:  None, uses cane at all times Are you currently taking any medications that make you dizzy?:No   Hearing Difficulties: No Do you often ask people to speak up or repeat themselves?:  sometimes  Do you experience ringing or noises in your ears?:No Do you have difficulty understanding soft or whispered voices?:sometimes   Cognitive Testing  Alert? Yes Normal Appearance?Yes  Oriented to person? Yes Place? Yes  Time? Yes  Displays appropriate judgment?Yes  Can read the correct time from a watch face? yes Are you having problems remembering things? Sometimes she does   Advanced Directives have been discussed with the patient?Yes, patient states when clearly and distinctly asked in the presence of her daughter, that she is" looking forward to her home going' and that she is thankful for 89 years, and wants her daughter and grandson who she loves dearly to "have a life" does not want to be a bother" She does not want CPR in the event of an arrest. Daughter present, she understands and respects her Mother's wishes , and is in agreement with a DNR form being completed and kept in her home.   List the Names of Other Physician/Practitioners you currently use:  Dr Romeo Apple (Ortho)   Indicate any recent Medical Services you may have received from other than [facility] providers in the past year (date may be approximate).   Assessment:    Annual Wellness Exam   Plan:      Medicare Attestation  I have personally reviewed:  The patient's medical and social history  Their use of alcohol, tobacco or illicit drugs  Their current medications and supplements  The patient's functional ability including ADLs,fall risks, home  safety risks, cognitive, and hearing and visual impairment  Diet and physical activities  Evidence for depression or mood disorders  The patient's weight, height, BMI, and visual acuity have been recorded in the chart. I have made referrals, counseling, and provided education to the patient based on review of the above and I have provided the patient with a written personalized care plan for preventive services.      Review of Systems     Objective:    Physical Exam BP 142/78 mmHg  Pulse 83  Resp 16  Ht 5' 4.5" (1.638 m)  Wt 213 lb (96.616 kg)  BMI 36.01 kg/m2  SpO2 98%        Assessment & Plan:  Medicare annual wellness visit, subsequent Annual exam as documented. Counseling done  re healthy lifestyle involving commitment to regular exercise as able, nicotine cessation, heart healthy diet, .The importance of adequate sleep also discussed. Regular seat belt use and home safety, is also discussed. . Immunization  needs are specifically addressed at this visit.Reportedly had prevnar at the pharmacy , will follow up  End of life issus discussed with both the patient and her daughter who cares for her. Jennifer [name] states clearly that she does not want cardiopulmonary resuscitation in the event of an arrest, states she is "looking forward to, and ready for her home going" Daughter present , slightly tearful, but fully in agreement with er mother's decision, and stated that Jennifer [name], had up to 1 week ago called for her grandson, and stated that she felt she was "on her way out" and that she was at peace with this and that she loved them bot, her daughter and her grandson. She does  Have one other child , as son, who pt's daughter states would have little input into decision making. DNR form completed and signed at visit and given to patient's daughter for her to keep at home    DNR no code (do not resuscitate) Pt is a 89 breast cancer survivor, she has had bilateral mastectomy, with  MCI, cardiovascular and cerebrovascular disease, she continues to smoke and has severe osteoarthritis. She clearly states today at visit that in the event of cardiopulmonary arrest, she wants no CPR attempted. Her daughter is present and understands and is in agreement with her Mother's wishes. She undersatnds that acute illness will be treated and quality of life issues as far as sight , hearing , safety and mobility are paramount and remains commited to her  mother's care Form completed and signed by me at visit, and was given to patient's daughter to keep at home.

## 2014-04-25 DIAGNOSIS — Z66 Do not resuscitate: Secondary | ICD-10-CM | POA: Insufficient documentation

## 2014-04-25 NOTE — Assessment & Plan Note (Signed)
Pt is a 89 breast cancer survivor, she has had bilateral mastectomy, with  MCI, cardiovascular and cerebrovascular disease, she continues to smoke and has severe osteoarthritis. She clearly states today at visit that in the event of cardiopulmonary arrest, she wants no CPR attempted. Her daughter is present and understands and is in agreement with her Mother's wishes. She undersatnds that acute illness will be treated and quality of life issues as far as sight , hearing , safety and mobility are paramount and remains commited to her mother's care Form completed and signed by me at visit, and was given to patient's daughter to keep at home.

## 2014-05-03 DIAGNOSIS — H6521 Chronic serous otitis media, right ear: Secondary | ICD-10-CM | POA: Diagnosis not present

## 2014-05-03 DIAGNOSIS — H9071 Mixed conductive and sensorineural hearing loss, unilateral, right ear, with unrestricted hearing on the contralateral side: Secondary | ICD-10-CM | POA: Diagnosis not present

## 2014-05-03 DIAGNOSIS — H9193 Unspecified hearing loss, bilateral: Secondary | ICD-10-CM | POA: Diagnosis not present

## 2014-06-05 ENCOUNTER — Other Ambulatory Visit: Payer: Self-pay

## 2014-06-05 ENCOUNTER — Telehealth: Payer: Self-pay

## 2014-06-05 DIAGNOSIS — H903 Sensorineural hearing loss, bilateral: Secondary | ICD-10-CM | POA: Diagnosis not present

## 2014-06-05 DIAGNOSIS — R7301 Impaired fasting glucose: Secondary | ICD-10-CM

## 2014-06-05 DIAGNOSIS — H6521 Chronic serous otitis media, right ear: Secondary | ICD-10-CM | POA: Diagnosis not present

## 2014-06-05 MED ORDER — GABAPENTIN 300 MG PO CAPS: ORAL_CAPSULE | ORAL | Status: DC

## 2014-06-05 NOTE — Telephone Encounter (Signed)
Wants to know if [name] a1c has been tested in the past year, it has not so she would like it ordered. She thinks she eats a lot of sweets and needs labs done to see if diabetic. Spoke with daughter and I will order the test and patient will have it done

## 2014-06-08 DIAGNOSIS — R7301 Impaired fasting glucose: Secondary | ICD-10-CM | POA: Diagnosis not present

## 2014-06-09 LAB — HEMOGLOBIN A1C
HEMOGLOBIN A1C: 5.6 % (ref <5.7)
Mean Plasma Glucose: 114 mg/dL (ref <117)

## 2014-07-05 ENCOUNTER — Ambulatory Visit (INDEPENDENT_AMBULATORY_CARE_PROVIDER_SITE_OTHER): Payer: Medicare Other

## 2014-07-05 ENCOUNTER — Ambulatory Visit (INDEPENDENT_AMBULATORY_CARE_PROVIDER_SITE_OTHER): Payer: Medicare Other | Admitting: Orthopedic Surgery

## 2014-07-05 VITALS — BP 144/67 | Ht 64.5 in | Wt 208.0 lb

## 2014-07-05 DIAGNOSIS — M129 Arthropathy, unspecified: Secondary | ICD-10-CM | POA: Diagnosis not present

## 2014-07-05 DIAGNOSIS — M1710 Unilateral primary osteoarthritis, unspecified knee: Secondary | ICD-10-CM

## 2014-07-05 DIAGNOSIS — Z96653 Presence of artificial knee joint, bilateral: Secondary | ICD-10-CM | POA: Diagnosis not present

## 2014-07-05 NOTE — Progress Notes (Signed)
Patient ID: Jennifer [name], female   DOB: [birth year], 81 y.o.   MRN: [id number]  Post op annual TKA   Chief Complaint  Patient presents with  . Follow-up    yearly follow up + xray right tka, DOS 09/24/09    HPI Jennifer [name] is a 81 y.o. female.  Years post total knee no complaints related to the knee at this point  She is having ongoing issues with her back and her overall health is good but she is having more functional problems related to her long-standing back problems   Past Medical History  Diagnosis Date  . Chest pain 2006  . COPD (chronic obstructive pulmonary disease)   . Hyperlipidemia   . Osteoarthritis     status post left TKA; surgery on the right is anticipated in the near future   . Nicotine addiction   . Gastroesophageal reflux disease   . Hiatal hernia   . Anemia     iron deficiency post op.  . Chronic back pain   . Pulmonary nodules     stable since 2006  . Cellulitis of finger     right   . Shingles     right breast   . Ulcer     gential  . Skin infection   . HTN (hypertension) 04/22/2014  . CAD (coronary artery disease) 2008    Cath 40% mid LAD, 35% RCA    Past Surgical History  Procedure Laterality Date  . Cholecystectomy    . Combined hysterectomy abdominal w/ a&p repair / oophorectomy  approx. 40 years ago   . Mastectomy  1998 & 2007     right -1998 / left 2007  . Total knee arthroplasty  4/08    left   . Right knee replacement  09/2009    Dr. Romeo Apple  . Abdominal hysterectomy    . Breast surgery N/A     bilateral  . Cardiac catheterization  2008    Mid LAD 40%, RCA 25%.      Allergies  Allergen Reactions  . Iohexol      Desc: PT ALLERGIC TO CONTRAST- SHE CAN'T BREATH   . Iron   . Penicillins     Current Outpatient Prescriptions  Medication Sig Dispense Refill  . acetaminophen (TYLENOL) 500 MG tablet Take 1,000 mg by mouth every 6 (six) hours as needed for mild pain.    Marland Kitchen alendronate (FOSAMAX) 70 MG/75ML solution Take 75  mLs (70 mg total) by mouth every 7 (seven) days. Take with a full glass of water on an empty stomach. 300 mL 1  . aspirin EC 81 MG tablet Take 1 tablet (81 mg total) by mouth daily. (Patient not taking: Reported on 04/22/2014) 30 tablet 11  . budesonide-formoterol (SYMBICORT) 160-4.5 MCG/ACT inhaler Inhale 2 puffs into the lungs 2 (two) times daily. 1 Inhaler 3  . calcium-vitamin D (OSCAL 500/200 D-3) 500-200 MG-UNIT per tablet Take 1 tablet by mouth 2 (two) times daily. (Patient not taking: Reported on 04/22/2014) 60 tablet 11  . donepezil (ARICEPT) 5 MG tablet TAKE 1 TABLET BY MOUTH EVERY NIGHT AT BEDTIME 90 tablet 1  . gabapentin (NEURONTIN) 300 MG capsule TAKE 1 CAPSULE BY MOUTH EVERY MORNING, THEN TAKE 2 CAPSULES BY MOUTH EVERY NIGHT AT BEDTIME 270 capsule 0  . lidocaine (LIDODERM) 5 % Place 3 patches onto the skin daily. Remove & Discard patch within 12 hours or as directed by MD. *Patient states that these are applied to wrist,  leg, and back* 270 patch 0  . oxybutynin (DITROPAN-XL) 5 MG 24 hr tablet Take 1 tablet (5 mg total) by mouth at bedtime. 30 tablet 3  . Vitamin D, Ergocalciferol, (DRISDOL) 50000 UNITS CAPS capsule Take 1 capsule (50,000 Units total) by mouth every 7 (seven) days. (Patient not taking: Reported on 04/22/2014) 12 capsule 0   No current facility-administered medications for this visit.    Review of Systems Review of Systems   Physical Exam Blood pressure 144/67, height 5' 4.5" (1.638 m), weight 208 lb (94.348 kg).   Gen. appearance is normal there are no congenital abnormalities   The patient is oriented 3   Mood and affect are normal   Ambulation is assisted by a cane and very slow and labored    Knee inspection reveals a well-healed incision with no swelling   Knee flexion 110  Stability in the anteroposterior plane is normal as well as in the medial lateral plane  Motor exam reveals full extension without extensor lag   Data Reviewed KNEE XRAYS :  The knee implant shows no loosening   Assessment S/P rt TKA , left total knee in [place]  Plan    Repeat x-ray in a year       Fuller Canada 07/05/2014, 3:52 PM

## 2014-07-19 ENCOUNTER — Other Ambulatory Visit: Payer: Self-pay

## 2014-07-19 DIAGNOSIS — R32 Unspecified urinary incontinence: Secondary | ICD-10-CM

## 2014-07-19 MED ORDER — OXYBUTYNIN CHLORIDE ER 5 MG PO TB24: 5.0000 mg | ORAL_TABLET | Freq: Every day | ORAL | Status: DC

## 2014-07-20 ENCOUNTER — Other Ambulatory Visit: Payer: Self-pay

## 2014-07-20 DIAGNOSIS — R32 Unspecified urinary incontinence: Secondary | ICD-10-CM

## 2014-07-20 MED ORDER — OXYBUTYNIN CHLORIDE ER 5 MG PO TB24: 5.0000 mg | ORAL_TABLET | Freq: Every day | ORAL | Status: DC

## 2014-08-24 ENCOUNTER — Other Ambulatory Visit: Payer: Self-pay

## 2014-08-24 MED ORDER — IPRATROPIUM-ALBUTEROL 18-103 MCG/ACT IN AERO
INHALATION_SPRAY | RESPIRATORY_TRACT | Status: DC
Start: 2014-08-24 — End: 2014-11-01

## 2014-08-27 ENCOUNTER — Other Ambulatory Visit: Payer: Self-pay

## 2014-08-27 MED ORDER — ALBUTEROL SULFATE HFA 108 (90 BASE) MCG/ACT IN AERS: 2.0000 | INHALATION_SPRAY | Freq: Four times a day (QID) | RESPIRATORY_TRACT | Status: DC | PRN

## 2014-09-05 ENCOUNTER — Encounter: Payer: Self-pay | Admitting: *Deleted

## 2014-09-05 ENCOUNTER — Ambulatory Visit: Payer: Medicare Other | Admitting: Family Medicine

## 2014-09-20 ENCOUNTER — Ambulatory Visit: Payer: Medicare Other | Admitting: Family Medicine

## 2014-09-26 ENCOUNTER — Ambulatory Visit (INDEPENDENT_AMBULATORY_CARE_PROVIDER_SITE_OTHER): Payer: Medicare Other | Admitting: Family Medicine

## 2014-09-26 ENCOUNTER — Encounter: Payer: Self-pay | Admitting: Family Medicine

## 2014-09-26 VITALS — BP 144/80 | HR 90 | Resp 16 | Ht 65.0 in | Wt 215.0 lb

## 2014-09-26 DIAGNOSIS — F17210 Nicotine dependence, cigarettes, uncomplicated: Secondary | ICD-10-CM | POA: Diagnosis not present

## 2014-09-26 DIAGNOSIS — N39498 Other specified urinary incontinence: Secondary | ICD-10-CM

## 2014-09-26 DIAGNOSIS — M810 Age-related osteoporosis without current pathological fracture: Secondary | ICD-10-CM

## 2014-09-26 DIAGNOSIS — Z720 Tobacco use: Secondary | ICD-10-CM | POA: Diagnosis not present

## 2014-09-26 DIAGNOSIS — I10 Essential (primary) hypertension: Secondary | ICD-10-CM | POA: Diagnosis not present

## 2014-09-26 DIAGNOSIS — G3184 Mild cognitive impairment, so stated: Secondary | ICD-10-CM

## 2014-09-26 DIAGNOSIS — Z23 Encounter for immunization: Secondary | ICD-10-CM

## 2014-09-26 DIAGNOSIS — Z1211 Encounter for screening for malignant neoplasm of colon: Secondary | ICD-10-CM | POA: Diagnosis not present

## 2014-09-26 DIAGNOSIS — F17200 Nicotine dependence, unspecified, uncomplicated: Secondary | ICD-10-CM

## 2014-09-26 LAB — HEMOCCULT GUIAC POC 1CARD (OFFICE): Fecal Occult Blood, POC: NEGATIVE

## 2014-09-26 NOTE — Progress Notes (Signed)
Subjective:    Patient ID: Jennifer [name], female    DOB: 03/02/1922, 81 y.o.   MRN: [id number]  HPI   RENDA POHLMAN     MRN: [id number]      DOB: 1922/11/14   HPI Ms. Gadison is here for follow up and re-evaluation of chronic medical conditions, medication management and review of any available recent lab and radiology data.  Preventive health is updated, specifically  Cancer screening and Immunization.    The PT denies any adverse reactions to current medications since the last visit.  Daughter is concerned that her mom has not had a "physical exam" and also asks about mammogram , her mom has bilateral mastectomy and hysterectomy. Agree on rectal exam at this visit Uncertain as to whether med for incontinence s of any benefit, so we agree on trial off med once she has completed what she has. Asks about anti inflammatoriesfor pain management, but pt is sitting in room without cringing in pain,  will hold off on re introduction of anti inflammatories which she had requested be discontinued  ROS Denies recent fever or chills. Denies sinus pressure, nasal congestion, ear pain or sore throat. Denies chest congestion, productive cough or wheezing. Denies chest pains, palpitations and leg swelling Denies abdominal pain, nausea, vomiting,diarrhea or constipation.   Denies dysuria, frequency, hesitancy c/o incontinence uncertain as to whether medication is beneficial Chronic  joint pain, swelling and limitation in mobility. Denies headaches, seizures, numbness, or tingling. Denies depression, anxiety or insomnia. Denies skin break down or rash.   PE  BP 144/80 mmHg  Pulse 90  Resp 16  Ht  (1.651 m)  Wt 215 lb (97.523 kg)  BMI 35.78 kg/m2  SpO2 94%  Patient alert and oriented and in no cardiopulmonary distress.  HEENT: No facial asymmetry, EOMI,   oropharynx pink and moist.  Neck decreased though adequate ROM, no JVD, no mass.  Chest: Clear to auscultation  bilaterally.decreased air entry throughout  CVS: S1, S2 no murmurs, no S3.Regular rate.  ABD: Soft non tender.  Rectal: no mass, heme negative stool  Ext: No edema  MS: Decreased ROM spine, shoulders, hips and knees.  Skin: Intact, no ulcerations or rash noted.  Psych: Good eye contact, normal affect. Memory intact not anxious or depressed appearing.  CNS: CN 2-12 intact, power,  normal throughout.no focal deficits noted.   Assessment & Plan   HTN (hypertension) Slightly elevated blood pressure. No medication currently  DASH diet and commitment to daily physical activity for a minimum of 30 minutes discussed and encouraged, as a part of hypertension management. The importance of attaining a healthy weight is also discussed.  BP/Weight 09/26/2014 07/05/2014 04/24/2014 04/23/2014 04/22/2014 12/13/2013 09/05/2013  Systolic BP 144 144 142 146 - 742 152  Diastolic BP 80 67 78 66 - 78 76  Wt. (Lbs) 215 208 213 - 208 208 208.04  BMI 35.78 35.16 36.01 - 33.59 34.61 34.62        MCI (mild cognitive impairment) Continue sub optimal dosing of aricept, per preference of her daughter who is responsible for hr care  Urinary incontinence Will d/c medication on completion of current, daughter will observe if any evidence that medication is actually helping and call in for refill  SPINAL STENOSIS Chronic pain management will remain the same  Osteoporosis, post-menopausal Importance of taking bone builder as prescribed is stressed, daughter needs to bring meds to visit for review  Special screening for malignant neoplasms, colon Rectal exam:  no mass, and heme negative stool  Need for prophylactic vaccination and inoculation against influenza After obtaining informed consent, the vaccine is  administered by LPN.   TOBACCO ABUSE Patient counseled for approximately 5 minutes regarding the health risks of ongoing nicotine use, specifically all types of cancer, heart disease, stroke and  respiratory failure. The options available for help with cessation ,the behavioral changes to assist the process, and the option to either gradully reduce usage  Or abruptly stop.is also discussed. Pt is also encouraged to set specific goals in number of cigarettes used daily, as well as to set a quit date.  Number of cigarettes/cigars currently smoking daily:7 to 10        Review of Systems     Objective:   Physical Exam        Assessment & Plan:

## 2014-09-26 NOTE — Patient Instructions (Addendum)
F/u in 4 month, call if you need me sooner  Nurse visit for pneumonia vaccine in 2 weeks  Flu vaccine and rectal exam today, no hidden blood in stool which is good    When you finish current  Medication for wetting stop, it will not be refilled unless you call in for this as discussed  PLS be careful not to  Fall  Eat food prepared for you/ given to you by your daughter, no snacks  Work on stopping smoking please  Thanks for choosing [facility], we consider it a privelige to serve you.   Please bring all medications to your next visit

## 2014-09-30 DIAGNOSIS — Z1211 Encounter for screening for malignant neoplasm of colon: Secondary | ICD-10-CM | POA: Insufficient documentation

## 2014-09-30 DIAGNOSIS — Z23 Encounter for immunization: Secondary | ICD-10-CM | POA: Insufficient documentation

## 2014-09-30 NOTE — Assessment & Plan Note (Signed)
After obtaining informed consent, the vaccine is  administered by LPN.  

## 2014-09-30 NOTE — Assessment & Plan Note (Signed)
Continue sub optimal dosing of aricept, per preference of her daughter who is responsible for hr care

## 2014-09-30 NOTE — Assessment & Plan Note (Signed)
Will d/c medication on completion of current, daughter will observe if any evidence that medication is actually helping and call in for refill

## 2014-09-30 NOTE — Assessment & Plan Note (Signed)
Slightly elevated blood pressure. No medication currently  DASH diet and commitment to daily physical activity for a minimum of 30 minutes discussed and encouraged, as a part of hypertension management. The importance of attaining a healthy weight is also discussed.  BP/Weight 09/26/2014 07/05/2014 04/24/2014 04/23/2014 04/22/2014 12/13/2013 09/05/2013  Systolic BP 144 144 142 146 - 161 152  Diastolic BP 80 67 78 66 - 78 76  Wt. (Lbs) 215 208 213 - 208 208 208.04  BMI 35.78 35.16 36.01 - 33.59 34.61 34.62

## 2014-09-30 NOTE — Assessment & Plan Note (Signed)
Importance of taking bone builder as prescribed is stressed, daughter needs to bring meds to visit for review

## 2014-09-30 NOTE — Assessment & Plan Note (Signed)
Chronic pain management will remain the same

## 2014-09-30 NOTE — Assessment & Plan Note (Signed)
Patient counseled for approximately 5 minutes regarding the health risks of ongoing nicotine use, specifically all types of cancer, heart disease, stroke and respiratory failure. The options available for help with cessation ,the behavioral changes to assist the process, and the option to either gradully reduce usage  Or abruptly stop.is also discussed. Pt is also encouraged to set specific goals in number of cigarettes used daily, as well as to set a quit date.  Number of cigarettes/cigars currently smoking daily: 7 to 10  

## 2014-09-30 NOTE — Assessment & Plan Note (Signed)
Rectal exam: no mass, and heme negative stool

## 2014-10-01 NOTE — Addendum Note (Signed)
Addended by: Kandis Fantasia B on: 10/01/2014 09:14 AM   Modules accepted: Orders

## 2014-11-01 ENCOUNTER — Other Ambulatory Visit: Payer: Self-pay

## 2014-11-01 ENCOUNTER — Telehealth: Payer: Self-pay

## 2014-11-01 NOTE — Telephone Encounter (Signed)
error 

## 2014-11-03 ENCOUNTER — Other Ambulatory Visit: Payer: Self-pay | Admitting: Family Medicine

## 2014-11-15 DIAGNOSIS — T1490 Injury, unspecified: Secondary | ICD-10-CM | POA: Diagnosis not present

## 2014-12-07 ENCOUNTER — Emergency Department (HOSPITAL_COMMUNITY): Payer: Medicare Other

## 2014-12-07 ENCOUNTER — Encounter (HOSPITAL_COMMUNITY): Payer: Self-pay

## 2014-12-07 ENCOUNTER — Emergency Department (HOSPITAL_COMMUNITY)
Admission: EM | Admit: 2014-12-07 | Discharge: 2014-12-07 | Disposition: A | Discharge status: Home / Self Care | Payer: Medicare Other | Attending: Emergency Medicine | Admitting: Emergency Medicine

## 2014-12-07 DIAGNOSIS — Z043 Encounter for examination and observation following other accident: Secondary | ICD-10-CM | POA: Diagnosis not present

## 2014-12-07 DIAGNOSIS — M1990 Unspecified osteoarthritis, unspecified site: Secondary | ICD-10-CM | POA: Diagnosis not present

## 2014-12-07 DIAGNOSIS — Y9389 Activity, other specified: Secondary | ICD-10-CM | POA: Insufficient documentation

## 2014-12-07 DIAGNOSIS — M25552 Pain in left hip: Secondary | ICD-10-CM | POA: Diagnosis not present

## 2014-12-07 DIAGNOSIS — I10 Essential (primary) hypertension: Secondary | ICD-10-CM | POA: Insufficient documentation

## 2014-12-07 DIAGNOSIS — Z048 Encounter for examination and observation for other specified reasons: Secondary | ICD-10-CM | POA: Diagnosis not present

## 2014-12-07 DIAGNOSIS — W19XXXA Unspecified fall, initial encounter: Secondary | ICD-10-CM

## 2014-12-07 DIAGNOSIS — S199XXA Unspecified injury of neck, initial encounter: Secondary | ICD-10-CM | POA: Diagnosis not present

## 2014-12-07 DIAGNOSIS — Z8639 Personal history of other endocrine, nutritional and metabolic disease: Secondary | ICD-10-CM | POA: Insufficient documentation

## 2014-12-07 DIAGNOSIS — Z880 Allergy status to penicillin: Secondary | ICD-10-CM | POA: Diagnosis not present

## 2014-12-07 DIAGNOSIS — Z79899 Other long term (current) drug therapy: Secondary | ICD-10-CM | POA: Insufficient documentation

## 2014-12-07 DIAGNOSIS — M25551 Pain in right hip: Secondary | ICD-10-CM | POA: Diagnosis not present

## 2014-12-07 DIAGNOSIS — J449 Chronic obstructive pulmonary disease, unspecified: Secondary | ICD-10-CM | POA: Insufficient documentation

## 2014-12-07 DIAGNOSIS — Y998 Other external cause status: Secondary | ICD-10-CM | POA: Insufficient documentation

## 2014-12-07 DIAGNOSIS — G8929 Other chronic pain: Secondary | ICD-10-CM | POA: Diagnosis not present

## 2014-12-07 DIAGNOSIS — W108XXA Fall (on) (from) other stairs and steps, initial encounter: Secondary | ICD-10-CM | POA: Diagnosis not present

## 2014-12-07 DIAGNOSIS — I2510 Atherosclerotic heart disease of native coronary artery without angina pectoris: Secondary | ICD-10-CM | POA: Diagnosis not present

## 2014-12-07 DIAGNOSIS — Z872 Personal history of diseases of the skin and subcutaneous tissue: Secondary | ICD-10-CM | POA: Insufficient documentation

## 2014-12-07 DIAGNOSIS — M545 Low back pain: Secondary | ICD-10-CM | POA: Diagnosis not present

## 2014-12-07 DIAGNOSIS — Y92009 Unspecified place in unspecified non-institutional (private) residence as the place of occurrence of the external cause: Secondary | ICD-10-CM | POA: Diagnosis not present

## 2014-12-07 DIAGNOSIS — Z8619 Personal history of other infectious and parasitic diseases: Secondary | ICD-10-CM | POA: Diagnosis not present

## 2014-12-07 DIAGNOSIS — Z8719 Personal history of other diseases of the digestive system: Secondary | ICD-10-CM | POA: Insufficient documentation

## 2014-12-07 DIAGNOSIS — S0990XA Unspecified injury of head, initial encounter: Secondary | ICD-10-CM | POA: Diagnosis not present

## 2014-12-07 DIAGNOSIS — Z862 Personal history of diseases of the blood and blood-forming organs and certain disorders involving the immune mechanism: Secondary | ICD-10-CM | POA: Insufficient documentation

## 2014-12-07 DIAGNOSIS — F17210 Nicotine dependence, cigarettes, uncomplicated: Secondary | ICD-10-CM | POA: Insufficient documentation

## 2014-12-07 DIAGNOSIS — S3992XA Unspecified injury of lower back, initial encounter: Secondary | ICD-10-CM | POA: Diagnosis not present

## 2014-12-07 HISTORY — DX: Dependence on other enabling machines and devices: Z99.89

## 2014-12-07 NOTE — ED Notes (Signed)
Pt states she was outside smoking and when she turned to go back in the house, she tripped on a wooden step and fell.  Pt denies complaints at this time, nad

## 2014-12-07 NOTE — Discharge Instructions (Signed)
°Emergency Department Resource Guide °1) Find a Doctor and Pay Out of Pocket °Although you won't have to find out who is covered by your insurance plan, it is a good idea to ask around and get recommendations. You will then need to call the office and see if the doctor you have chosen will accept you as a new patient and what types of options they offer for patients who are self-pay. Some doctors offer discounts or will set up payment plans for their patients who do not have insurance, but you will need to ask so you aren't surprised when you get to your appointment. ° °2) Contact Your Local Health Department °Not all health departments have doctors that can see patients for sick visits, but many do, so it is worth a call to see if yours does. If you don't know where your local health department is, you can check in your phone book. The CDC also has a tool to help you locate your state's health department, and many state websites also have listings of all of their local health departments. ° °3) Find a Walk-in Clinic °If your illness is not likely to be very severe or complicated, you may want to try a walk in clinic. These are popping up all over the country in pharmacies, drugstores, and shopping centers. They're usually staffed by nurse practitioners or physician assistants that have been trained to treat common illnesses and complaints. They're usually fairly quick and inexpensive. However, if you have serious medical issues or chronic medical problems, these are probably not your best option. ° °No Primary Care Doctor: °- Call [facility] at  832-8000 - they can help you locate a primary care doctor that  accepts your insurance, provides certain services, etc. °- Physician Referral Service- 1-800-533-3463 ° °Chronic Pain Problems: °Organization         Address  Phone   Notes  °[facility]  (336) 297-2271 Patients need to be referred by their primary care doctor.  ° °Medication  Assistance: °Organization         Address  Phone   Notes  °[place] [facility] [address] °[place], [place] [address] (336) 641-8030 --Must be a resident of [place] °-- Must have NO insurance coverage whatsoever (no Medicaid/ Medicare, etc.) °-- The pt. MUST have a primary care doctor that directs their care regularly and follows them in the community °  °[facility]  (866) 331-1348   °[employer]  (888) 892-1162   ° °Agencies that provide inexpensive medical care: °Organization         Address  Phone   Notes  °[facility]  (336) 832-8035   °[facility]    (336) 832-7272   °[facility] [address] °[place], [place] [address] (336) 832-4777   °[facility] [address], °[place] (336) 271-4999   °[facility]    (336) 373-0678   °[facility]    (336) 272-1050   °[facility] ° [address], [place] Phone:  (336) 832-4444, Fax:  (336) 832-4440 Hours of Operation:  9 am - 6 pm, M-F.  Also accepts Medicaid/Medicare and self-pay.  °[facility] ° [address], [place] Phone: (336) 832-3150, Fax: (336) 832-3151. Hours of Operation:  8:30 am - 5:30 pm, M-F.  Also accepts Medicaid and self-pay.  °[facility] [place] [address], [place] Phone: (336) 878-6027   °[facility] [address], [place], [place] (336)723-1848, Ext. 123 Mondays & Thursdays: 7-9 AM.  First 15 patients are seen on a first come, first serve basis. °  ° °Medicaid-accepting [place] Providers: ° °Organization         Address  Phone   Notes  °[facility] [address], [place] (336) 641-2100 Also accepts self-pay patients.  °[facility] [address], [place] ° (336) 856-9996   °[facility] [address], [place]  (336) 288-8857   °[facility] [address] [place] [address], [place] (336) 299-7000   °Veita Bland [address], [place]  ° (336) 373-1557 Only accepts [place] Access Medicaid patients after they have their name applied to their card.  ° °Self-Pay (no insurance) in [place]: ° °Organization         Address  Phone   Notes  °Sickle Cell Patients, [facility] [address], [place] (336) 832-1970   °[facility] [address], [place] (336) 832-4400   °[facility] ° [address], [place] (336) 992-4800   °[facility]/Dr. Osei-Bonsu ° [address] [place] [address], [place] or [address], [place] (336) 841-8500 Phone number for both [place] and [place] locations is the same.  °[facility] [address], [place] (336) 299-0000   °[facility] [address] [place] [address], [place] or [address] (336) 852-7530 °(336) 878-2260   °[facility] [address], [place] (336) 350-1642, phone; (336) 294-5005, fax Sees patients 1st and 3rd Saturday of every month.  Must not qualify for public or private insurance (i.e. Medicaid, Medicare, [place] Health Choice, Veterans' Benefits) • Household income should be no more than 200% of the poverty level •The clinic cannot treat you if you are pregnant or think you are pregnant • Sexually transmitted diseases are not treated at the clinic.  ° ° °Dental Care: °Organization         Address  Phone  Notes  °[place] [facility] [address], [place] (336) 641-6152 Accepts children up to age 21 who are enrolled in Medicaid or [place] Health Choice; pregnant women with a Medicaid card; and children who have applied for Medicaid or [place] Health Choice, but were declined, whose parents can pay a reduced fee at time of service.  °[place]  [facility] [place]  [address], [place] (336) 641-7733 Accepts children up to age 21 who are enrolled in Medicaid or [place] Health Choice; pregnant women with a Medicaid card; and children who have applied for Medicaid or [place] Health Choice, but were declined, whose parents can pay a reduced fee at time of service.  °[facility] ° [address], [place] (336) 641-4533 Patients are seen by appointment only. Walk-ins are not accepted. [facility] will see patients 18 years of age and older. °Monday - Tuesday (8am-5pm) °Most Wednesdays (8:30-5pm) °$30 per visit, cash only  °[facility] ° [address], [place] (336) 641-4533 Patients are seen by appointment only. Walk-ins are not accepted. [facility] will see patients 18 years of age and older. °One   Wednesday Evening (Monthly: Volunteer Based).  $30 per visit, cash only  °[facility]  (919) 537-3737 for adults; Children under age 4, call Graduate Pediatric Dentistry at (919) [phone]. Children aged 4-14, please call (919) [phone] to request a pediatric application. ° Dental services are provided in all areas of dental care including fillings, crowns and bridges, complete and partial dentures, implants, gum treatment, root canals, and extractions. Preventive care is also provided. Treatment is provided to both adults and children. °Patients are selected via a lottery and there is often a waiting list. °  °[facility] [address], °[place] ° (336) 763-8833 [email or web address] °  °[facility] [address], [place], [place] (336)723-1848, Ext. 123 Second and Fourth Thursday of each month, opens at 6:30 AM; Clinic ends at 9 AM.  Patients are seen on a first-come first-served basis, and a limited number are seen during each clinic.  ° °[facility] ° [address], [place], [place] (336) 723-7904    Eligibility Requirements °You must have lived in [place], [place], or [place] counties for at least the last three months. °  You cannot be eligible for state or federal sponsored healthcare insurance, including Veterans Administration, Medicaid, or Medicare. °  You generally cannot be eligible for healthcare insurance through your employer.  °  How to apply: °Eligibility screenings are held every Tuesday and Wednesday afternoon from 1:00 pm until 4:00 pm. You do not need an appointment for the interview!  °[facility] [address], [place], [place] 336-631-2330   °[facility]  336-342-8273   °[facility]  336-703-3100   °[facility]  336-570-6415   ° °Behavioral Health Resources in the Community: °Intensive Outpatient Programs °Organization         Address  Phone  Notes  °[facility] [address], [place], [place] 336-878-6098   °[facility] [address], [place], [place] 336-832-9800   °[facility] [address], [place], [place] ° 336-882-2125   °[facility] [address],  °[place], [place] 1-800-853-5163 or 336-641-4981   °Substance Abuse Resources °Organization         Address  Phone  Notes  °[facility]  336-882-2125   °[facility]  336-784-9470   °[facility]  336-285-9073   °[facility]  336-845-3988   °Residential & Outpatient Substance Abuse Program  1-800-659-3381   °Psychological Services °Organization         Address  Phone  Notes  °[facility]  336- 832-9600   °[facility]  336- 378-7881   °[facility] [address], [place] 1-800-853-5163 or 336-641-4981   ° °Mobile Crisis Teams °Organization         Address  Phone  Notes  °[facility], Mobile Crisis Care Unit  1-877-626-1772   °Assertive °[facility] ° [address]  [place], [place] 336-834-9664   °Sharon DeEsch [address] °[place] 336-554-5454   ° °Self-Help/Support Groups °Organization         Address  Phone             Notes  °[facility]  - variety of support groups  336- 373-1402 Call for more information  °Narcotics Anonymous (NA), [facility] [address], °[place]  2 meetings at this location  ° °  Residential Treatment Programs °Organization         Address  Phone  Notes  °[facility] [address],    °[place]  1-866-801-8205   °[facility] ° [address], [place], [place] 704-293-8524   °[facility] [address], [place] 336-845-3988 Admissions: 8am-3pm M-F  °[facility] [address]    °[place], [place] 336-841-1104   °[facility] [address], [place], [place] 336-379-7146   °[facility] [address]  °[place], [place] 336-285-9073   °[facility] - Intensive Outpatient [address], [place], [place] 336-852-3033   °[facility] [address]  °[place], [place] 1-877-615-2722 or 336-784-9470   °[facility] ([facility] [address] [place], [place] 336-227-7417 Accepts Medicaid  °[facility] [address]  °[place] 1-800-659-3381 Substance Abuse/Addiction Treatment  ° °[place] Behavioral Health Resources °Organization         Address  Phone  Notes  °[facility]  (888) 581-9988   °Julie Brannon, PhD [address] [place], [place]   (336) 349-5553 or (336) 951-0000   °[facility]   [address] °[place], [place] (336) 349-4454   °[facility] [address], [place], [place] (336) 342-8316 Insurance/Medicaid/sponsorship through [facility]  °[facility] [address]                                    [place], [place] (336) 342-8316 Therapy/tele-psych/case    °[facility] [address]  ° [place], [place] (336) 349-2233    °Dr. Arfeen  (336) 349-4544   °[facility] [place]  [employer] [place] [facility] 1) [address],  °2) [address], [place] °3)  [address], [place] (336) 349-3220 °(336) 342-7768 ° °(336) 342-8140   °[place] [facility] (336) 342-1394 or (336) 342-3537 (After Hours)    ° ° °Take your usual prescriptions as previously directed.  Call your regular medical doctor on Monday to schedule a follow up appointment next week.  Return to the Emergency Department immediately sooner if worsening.  ° °

## 2014-12-07 NOTE — ED Provider Notes (Signed)
CSN: [id number]     Arrival date & time 12/07/14  1950 History   First MD Initiated Contact with Patient 12/07/14 2004     Chief Complaint  Patient presents with  . Fall      HPI  Pt was seen at 2015. Per pt and her family, c/o sudden onset and resolution of one episode of trip and fall that occurred PTA. Pt states she was outside smoking, turned to go back into the house, got her cane on the steps, and then tripped on the step. Pt states she fell backwards. Family states they found pt "sitting on the ground." Denies LOC, no AMS, no CP/SOB, no abd pain, no N/V/D, no focal motor weakness, no tingling/numbness in extremities.   Past Medical History  Diagnosis Date  . Chest pain 2006  . COPD (chronic obstructive pulmonary disease) (HCC)   . Hyperlipidemia   . Osteoarthritis     status post left TKA; surgery on the right is anticipated in the near future   . Nicotine addiction   . Gastroesophageal reflux disease   . Hiatal hernia   . Anemia     iron deficiency post op.  . Chronic back pain   . Pulmonary nodules     stable since 2006  . Cellulitis of finger     right   . Shingles     right breast   . Ulcer     gential  . Skin infection   . HTN (hypertension) 04/22/2014  . CAD (coronary artery disease) 2008    Cath 40% mid LAD, 35% RCA  . Use of cane as ambulatory aid    Past Surgical History  Procedure Laterality Date  . Cholecystectomy    . Combined hysterectomy abdominal w/ a&p repair / oophorectomy  approx. 40 years ago   . Mastectomy  1998 & 2007     right -1998 / left 2007  . Total knee arthroplasty  4/08    left   . Right knee replacement  09/2009    Dr. Romeo AppleHarrison  . Abdominal hysterectomy    . Breast surgery N/A     bilateral  . Cardiac catheterization  2008    Mid LAD 40%, RCA 25%.    Family History  Problem Relation Age of Onset  . Cancer Mother [phone]    breast   . Cancer Father [id number]    prostate  . Hypertension Father   . Hypertension Sister    Social  History  Substance Use Topics  . Smoking status: Current Every Day Smoker -- 0.25 packs/day for 70 years    Types: Cigarettes  . Smokeless tobacco: Never Used  . Alcohol Use: No    Review of Systems ROS: Statement: All systems negative except as marked or noted in the HPI; Constitutional: Negative for fever and chills. ; ; Eyes: Negative for eye pain, redness and discharge. ; ; ENMT: Negative for ear pain, hoarseness, nasal congestion, sinus pressure and sore throat. ; ; Cardiovascular: Negative for chest pain, palpitations, diaphoresis, dyspnea and peripheral edema. ; ; Respiratory: Negative for cough, wheezing and stridor. ; ; Gastrointestinal: Negative for nausea, vomiting, diarrhea, abdominal pain, blood in stool, hematemesis, jaundice and rectal bleeding. . ; ; Genitourinary: Negative for dysuria, flank pain and hematuria. ; ; Musculoskeletal: Negative for back pain and neck pain. Negative for swelling and trauma.; ; Skin: Negative for pruritus, rash, abrasions, blisters, bruising and skin lesion.; ; Neuro: Negative for headache, lightheadedness and neck stiffness. Negative  for weakness, altered level of consciousness , altered mental status, extremity weakness, paresthesias, involuntary movement, seizure and syncope.      Allergies  Iohexol; Iron; and Penicillins  Home Medications   Prior to Admission medications   Medication Sig Start Date End Date Taking? Authorizing Provider  acetaminophen (TYLENOL) 500 MG tablet Take 1,000 mg by mouth 2 (two) times daily. *May take an additional dose if needed for pain   Yes Historical Provider, MD  albuterol (PROVENTIL HFA;VENTOLIN HFA) 108 (90 BASE) MCG/ACT inhaler Inhale 2 puffs into the lungs every 6 (six) hours as needed for wheezing or shortness of breath. 08/27/14  Yes [name], MD  donepezil (ARICEPT) 5 MG tablet TAKE 1 TABLET BY MOUTH EVERY NIGHT AT BEDTIME 11/05/14  Yes [name], MD  gabapentin (NEURONTIN) 300 MG capsule  TAKE 1 CAPSULE BY MOUTH EVERY MORNING, THEN TAKE 2 CAPSULES BY MOUTH EVERY NIGHT AT BEDTIME Patient taking differently: Take 300-600 mg by mouth 2 (two) times daily. TAKE 1 CAPSULE BY MOUTH EVERY MORNING, THEN TAKE 2 CAPSULES BY MOUTH EVERY NIGHT AT BEDTIME 06/05/14  Yes [name], MD  lidocaine (LIDODERM) 5 % Place 3 patches onto the skin daily. Remove & Discard patch within 12 hours or as directed by MD. *Patient states that these are applied to wrist, leg, and back* Patient taking differently: Place 3 patches onto the skin daily as needed (for pain). Remove & Discard patch within 12 hours or as directed by MD. *Patient states that these are applied to wrist, leg, and back* 12/13/13  Yes [name], MD  Multiple Vitamin (MULTIVITAMIN WITH MINERALS) TABS tablet Take 1 tablet by mouth daily.   Yes Historical Provider, MD   BP 151/87 mmHg  Pulse 72  Temp(Src) 97.9 F (36.6 C) (Oral)  Resp 24  SpO2 97% Physical Exam  2020: Physical examination: Vital signs and O2 SAT: Reviewed; Constitutional: Well developed, Well nourished, Well hydrated, In no acute distress; Head and Face: Normocephalic, Atraumatic; Eyes: EOMI, PERRL, No scleral icterus; ENMT: Mouth and pharynx normal, Mucous membranes moist; Neck: Immobilized in C-collar, Trachea midline; Spine: No midline CS, TS, LS tenderness.; Cardiovascular: Regular rate and rhythm, No gallop; Respiratory: Breath sounds clear & equal bilaterally, No wheezes, Normal respiratory effort/excursion; Chest: Nontender, No deformity, Movement normal, No crepitus, No abrasions or ecchymosis.; Abdomen: Soft, Nontender, Nondistended, Normal bowel sounds, No abrasions or ecchymosis.; Genitourinary: No CVA tenderness;; Extremities: No deformity, Full range of motion major/large joints of bilat UE's and LE's without pain or tenderness to palp, Neurovascularly intact, Pulses normal, No tenderness, +2 pedal edema bilat, no calf asymmetry. Pelvis stable; Neuro:  AA&Ox3, GCS 15.  Major CN grossly intact. Speech clear. No gross focal motor or sensory deficits in extremities. Climbs on and off stretcher easily by herself. Gait steady.; Skin: Color normal, Warm, Dry   ED Course  Procedures (including critical care time)  Labs Review  Imaging Review  I have personally reviewed and evaluated these images and lab results as part of my medical decision-making.   EKG Interpretation None      MDM  MDM Reviewed: previous chart, nursing note and vitals Interpretation: x-ray and CT scan    Dg Lumbar Spine Complete 12/07/2014  CLINICAL DATA:  Patient tripped on a wooden step and fell. Complains of low back pain and bilateral hip pain. EXAM: LUMBAR SPINE - COMPLETE 4+ VIEW COMPARISON:  05/31/2012 FINDINGS: Diffuse bone demineralization. Diffuse degenerative change throughout the lumbar spine with narrowed interspaces  and associated endplate hypertrophic changes. Degenerative disc disease at L3-4, L4-5, L5-S1 levels. No vertebral compression deformities. Normal alignment of the lumbar spine. No focal bone lesion or bone destruction. Vascular calcifications. Visualized sacrum appears intact. IMPRESSION: Degenerative changes in the lumbar spine. Normal alignment. No acute displaced fractures identified. Electronically Signed   By: Burman Nieves M.D.   On: 12/07/2014 21:06   Ct Head Wo Contrast 12/07/2014  CLINICAL DATA:  Patient tripped and fell at home EXAM: CT HEAD WITHOUT CONTRAST CT CERVICAL SPINE WITHOUT CONTRAST TECHNIQUE: Multidetector CT imaging of the head and cervical spine was performed following the standard protocol without intravenous contrast. Multiplanar CT image reconstructions of the cervical spine were also generated. COMPARISON:  None. FINDINGS: CT HEAD FINDINGS Age-appropriate atrophy and low attenuation in the deep white matter. No evidence of infarct mass or hydrocephalus. No hemorrhage or extra-axial fluid. No skull fracture. CT CERVICAL  SPINE FINDINGS Multilevel degenerative disc disease in the central cervical spine. Normal alignment with no fracture. No paraspinous soft tissue abnormality. IMPRESSION: No acute abnormality involving the head or cervical spine. Electronically Signed   By: Esperanza Heir M.D.   On: 12/07/2014 21:28   Ct Cervical Spine Wo Contrast 12/07/2014  CLINICAL DATA:  Patient tripped and fell at home EXAM: CT HEAD WITHOUT CONTRAST CT CERVICAL SPINE WITHOUT CONTRAST TECHNIQUE: Multidetector CT imaging of the head and cervical spine was performed following the standard protocol without intravenous contrast. Multiplanar CT image reconstructions of the cervical spine were also generated. COMPARISON:  None. FINDINGS: CT HEAD FINDINGS Age-appropriate atrophy and low attenuation in the deep white matter. No evidence of infarct mass or hydrocephalus. No hemorrhage or extra-axial fluid. No skull fracture. CT CERVICAL SPINE FINDINGS Multilevel degenerative disc disease in the central cervical spine. Normal alignment with no fracture. No paraspinous soft tissue abnormality. IMPRESSION: No acute abnormality involving the head or cervical spine. Electronically Signed   By: Esperanza Heir M.D.   On: 12/07/2014 21:28   Dg Hips Bilat With Pelvis 3-4 Views 12/07/2014  CLINICAL DATA:  Fall on a wooden step.  Bilateral hip pain. EXAM: DG HIP (WITH OR WITHOUT PELVIS) 3-4V BILAT COMPARISON:  None. FINDINGS: Lower lumbar spondylosis and degenerative disc disease. Moderate axial and craniocaudad loss of articular space in the hips. Bony demineralization. No definite cortical discontinuity characteristic of fracture. On the lateral projections, bony continuity along the femoral neck is more difficult to ensure on the right compared to the left but this is probably due to some flexion of the right hip compared to the left. IMPRESSION: 1. No definite fracture. If the patient is unable to bear weight, then CT or MRI should be considered. 2.  Lower lumbar spondylosis and degenerative disc disease. 3. Moderate degenerative chondral thinning in both hips. Electronically Signed   By: Gaylyn Rong M.D.   On: 12/07/2014 21:06     2140:  Pt has been ambulatory in the ED to the bathroom with steady gait.  XR/CT reassuring. Pt wants to go home now. Dx and testing d/w pt and family.  Questions answered.  Verb understanding, agreeable to d/c home with outpt f/u.   Samuel Jester, DO 12/11/14 1901

## 2014-12-08 ENCOUNTER — Other Ambulatory Visit: Payer: Self-pay | Admitting: Family Medicine

## 2014-12-11 ENCOUNTER — Telehealth: Payer: Self-pay | Admitting: Family Medicine

## 2014-12-11 ENCOUNTER — Encounter: Payer: Self-pay | Admitting: Family Medicine

## 2014-12-11 ENCOUNTER — Other Ambulatory Visit: Payer: Self-pay

## 2014-12-11 MED ORDER — GABAPENTIN 300 MG PO CAPS: ORAL_CAPSULE | ORAL | Status: DC

## 2014-12-11 NOTE — Telephone Encounter (Signed)
Patient needs gabapentin (NEURONTIN) 300 MG capsule  Refilled, please advise?

## 2014-12-11 NOTE — Telephone Encounter (Signed)
Medication refilled

## 2015-01-28 ENCOUNTER — Encounter: Payer: Self-pay | Admitting: Family Medicine

## 2015-01-28 ENCOUNTER — Ambulatory Visit: Payer: Medicare Other | Admitting: Family Medicine

## 2015-01-31 ENCOUNTER — Telehealth: Payer: Self-pay

## 2015-01-31 DIAGNOSIS — E559 Vitamin D deficiency, unspecified: Secondary | ICD-10-CM

## 2015-01-31 DIAGNOSIS — G3184 Mild cognitive impairment, so stated: Secondary | ICD-10-CM

## 2015-01-31 DIAGNOSIS — I10 Essential (primary) hypertension: Secondary | ICD-10-CM

## 2015-01-31 NOTE — Telephone Encounter (Signed)
Add cmp and vit D to what you have mentioned pls

## 2015-01-31 NOTE — Addendum Note (Signed)
Addended by: Kandis Fantasia B on: 01/31/2015 03:11 PM   Modules accepted: Orders

## 2015-01-31 NOTE — Telephone Encounter (Signed)
Labs ordered and mailed to home address along with appointment card.

## 2015-02-02 ENCOUNTER — Other Ambulatory Visit: Payer: Self-pay | Admitting: Family Medicine

## 2015-03-20 DIAGNOSIS — I10 Essential (primary) hypertension: Secondary | ICD-10-CM | POA: Diagnosis not present

## 2015-03-20 DIAGNOSIS — G3184 Mild cognitive impairment, so stated: Secondary | ICD-10-CM | POA: Diagnosis not present

## 2015-03-20 DIAGNOSIS — E559 Vitamin D deficiency, unspecified: Secondary | ICD-10-CM | POA: Diagnosis not present

## 2015-03-21 LAB — CBC
HCT: 37 % (ref 36.0–46.0)
Hemoglobin: 12 g/dL (ref 12.0–15.0)
MCH: 30.6 pg (ref 26.0–34.0)
MCHC: 32.4 g/dL (ref 30.0–36.0)
MCV: 94.4 fL (ref 78.0–100.0)
MPV: 9.8 fL (ref 8.6–12.4)
Platelets: 206 10*3/uL (ref 150–400)
RBC: 3.92 MIL/uL (ref 3.87–5.11)
RDW: 14.5 % (ref 11.5–15.5)
WBC: 4.2 10*3/uL (ref 4.0–10.5)

## 2015-03-21 LAB — COMPREHENSIVE METABOLIC PANEL
ALK PHOS: 82 U/L (ref 33–130)
ALT: 11 U/L (ref 6–29)
AST: 13 U/L (ref 10–35)
Albumin: 3.3 g/dL — ABNORMAL LOW (ref 3.6–5.1)
BUN: 12 mg/dL (ref 7–25)
CHLORIDE: 106 mmol/L (ref 98–110)
CO2: 31 mmol/L (ref 20–31)
Calcium: 8.3 mg/dL — ABNORMAL LOW (ref 8.6–10.4)
Creat: 0.68 mg/dL (ref 0.60–0.88)
GLUCOSE: 91 mg/dL (ref 65–99)
Potassium: 3.5 mmol/L (ref 3.5–5.3)
Sodium: 144 mmol/L (ref 135–146)
Total Bilirubin: 0.4 mg/dL (ref 0.2–1.2)
Total Protein: 6.1 g/dL (ref 6.1–8.1)

## 2015-03-21 LAB — LIPID PANEL
Cholesterol: 169 mg/dL (ref 125–200)
HDL: 55 mg/dL (ref >=46)
LDL CALC: 90 mg/dL (ref <130)
TRIGLYCERIDES: 121 mg/dL (ref <150)
Total CHOL/HDL Ratio: 3.1 Ratio (ref <=5.0)
VLDL: 24 mg/dL (ref <30)

## 2015-03-21 LAB — TSH: TSH: 1.68 mIU/L

## 2015-03-21 LAB — VITAMIN D 25 HYDROXY (VIT D DEFICIENCY, FRACTURES): Vit D, 25-Hydroxy: 27 ng/mL — ABNORMAL LOW (ref 30–100)

## 2015-03-26 ENCOUNTER — Ambulatory Visit (INDEPENDENT_AMBULATORY_CARE_PROVIDER_SITE_OTHER): Payer: Medicare Other | Admitting: Family Medicine

## 2015-03-26 ENCOUNTER — Encounter: Payer: Self-pay | Admitting: Family Medicine

## 2015-03-26 VITALS — BP 130/74 | HR 82 | Resp 18 | Ht 65.0 in | Wt 217.0 lb

## 2015-03-26 DIAGNOSIS — Z723 Lack of physical exercise: Secondary | ICD-10-CM | POA: Diagnosis not present

## 2015-03-26 DIAGNOSIS — R296 Repeated falls: Secondary | ICD-10-CM

## 2015-03-26 DIAGNOSIS — F17200 Nicotine dependence, unspecified, uncomplicated: Secondary | ICD-10-CM

## 2015-03-26 DIAGNOSIS — G3184 Mild cognitive impairment, so stated: Secondary | ICD-10-CM

## 2015-03-26 DIAGNOSIS — Z23 Encounter for immunization: Secondary | ICD-10-CM

## 2015-03-26 DIAGNOSIS — M158 Other polyosteoarthritis: Secondary | ICD-10-CM | POA: Diagnosis not present

## 2015-03-26 DIAGNOSIS — J449 Chronic obstructive pulmonary disease, unspecified: Secondary | ICD-10-CM

## 2015-03-26 DIAGNOSIS — Z789 Other specified health status: Secondary | ICD-10-CM

## 2015-03-26 DIAGNOSIS — Z9181 History of falling: Secondary | ICD-10-CM

## 2015-03-26 MED ORDER — CELECOXIB 100 MG PO CAPS: 100.0000 mg | ORAL_CAPSULE | Freq: Every day | ORAL | Status: DC

## 2015-03-26 MED ORDER — PREDNISONE 5 MG PO TABS: 5.0000 mg | ORAL_TABLET | Freq: Two times a day (BID) | ORAL | Status: AC

## 2015-03-26 MED ORDER — KETOROLAC TROMETHAMINE 60 MG/2ML IM SOLN
60.0000 mg | Freq: Once | INTRAMUSCULAR | Status: AC
  Administered 2015-03-26: 60 mg via INTRAMUSCULAR

## 2015-03-26 MED ORDER — METHYLPREDNISOLONE ACETATE 80 MG/ML IJ SUSP
80.0000 mg | Freq: Once | INTRAMUSCULAR | Status: AC
  Administered 2015-03-26: 80 mg via INTRAMUSCULAR

## 2015-03-26 NOTE — Assessment & Plan Note (Addendum)
Uncontrolled.Toradol and depo medrol administered IM in the office , to be followed by a short course of oral prednisone . She is to resume celebrex as an anti inflammatory agent , and to continue daily tylenol and gabapentin also Use of topical non anti inflammatory rubs like Bengay, is also encouraged

## 2015-03-26 NOTE — Progress Notes (Signed)
   Subjective:    Patient ID: Jennifer [name], female    DOB: 08-28-1922, 82 y.o.   MRN: [id number]  HPI   Jennifer [name]     MRN: [id number]      DOB: 08-28-1922   HPI [name] is here for follow up and re-evaluation of chronic medical conditions, medication management and review of any available recent lab and radiology data.  Preventive health is updated, specifically   Immunization.    The PT denies any adverse reactions to current medications since the last visit.  Accompanied  By her daughter who is her caregiver C/o increased and uncontrolled generalized joint pain, wishes to have her Mom resume celebrex as this was very beneficial, less/ not worried about potential cardiac or renal adverse effects. Labs reviewed today are excellent. Reports 6 falls since last visit, and is willing to get PT for fall risk reduction and improved gait  ROS Denies recent fever or chills. Denies sinus pressure, nasal congestion, ear pain or sore throat. Denies chest congestion, productive cough or wheezing. Denies chest pains, palpitations and leg swelling Denies abdominal pain, nausea, vomiting,diarrhea or constipation.   Denies dysuria, frequency, hesitancy does have  incontinence.  Denies headaches, seizures, numbness, or tingling. Denies depression, anxiety or insomnia. Denies skin break down or rash.   PE  BP 130/74 mmHg  Pulse 82  Resp 18  Ht 5\' 5"  (1.651 m)  Wt 217 lb (98.431 kg)  BMI 36.11 kg/m2  SpO2 97%  Patient alert and oriented and in no cardiopulmonary distress.Pt in pain  HEENT: No facial asymmetry, EOMI,   oropharynx pink and moist.  Neck decreased ROM no JVD, no mass.  Chest: Clear to auscultation bilaterally.  CVS: S1, S2 no murmurs, no S3.Regular rate.  ABD: Soft non tender.   Ext: No edema  MS: decreased  ROM spine, shoulders, hips and knees.  Skin: Intact, no ulcerations or rash noted.  Psych: Good eye contact, normal affect. Memory impaired not  anxious or depressed appearing.  CNS: CN 2-12 intact, power,  normal throughout.reduced muscle tone   Assessment & Plan   Osteoarthritis Uncontrolled.Toradol and depo medrol administered IM in the office , to be followed by a short course of oral prednisone . She is to resume celebrex as an anti inflammatory agent , and to continue daily tylenol and gabapentin also Use of topical non anti inflammatory rubs like Bengay, is also encouraged   At high risk for falls Unsteady gait due to severe arthritis, will benefit from out patient Pt, referred for 6 weeks session Advised to use cane at all times and ensure proper lighting  MCI (mild cognitive impairment) Continue low dose aricept, needs re testing however, doubt that daughter is keen on this as she has resisted the standard treating dose of 10 mg daily over the years  COPD (chronic obstructive pulmonary disease) Longstanding nicotine use is underlying insult, probable recent discontinuation of cigarettes, uncertain  Need for vaccination with 13-polyvalent pneumococcal conjugate vaccine After obtaining informed consent, the vaccine is  administered by LPN.        Review of Systems     Objective:   Physical Exam        Assessment & Plan:

## 2015-03-26 NOTE — Patient Instructions (Addendum)
Annual wellness in August, call if you need me before   Excellent labs  Pneumonia vaccine today  Injections in office today for pain, and 5 day course of twice daily prednisone is prescribed  New for pain is celebrex 100 mg one daily STOP ibuprofen Continue tylenol 500 mg one twice daily Continue gabapentin 100 mg one daily  You are referred for out patient Pt /OTdue to 6 falls since last visit, weekly for 6 weeks  Equipment needed will be ordred  Once clarified  CONGRATS on smoking cessation!  [facility], we consider it a privelige to serve you.  HAPPY BIRTHDAY this MONTH...[birth year]! Eat more healthy food, not snacks , to improve overall health

## 2015-04-03 ENCOUNTER — Ambulatory Visit (HOSPITAL_COMMUNITY): Payer: Medicare Other

## 2015-04-03 ENCOUNTER — Encounter (HOSPITAL_COMMUNITY): Payer: Self-pay

## 2015-04-03 ENCOUNTER — Ambulatory Visit (HOSPITAL_COMMUNITY): Payer: Medicare Other | Attending: Family Medicine

## 2015-04-03 DIAGNOSIS — R29898 Other symptoms and signs involving the musculoskeletal system: Secondary | ICD-10-CM | POA: Insufficient documentation

## 2015-04-03 DIAGNOSIS — R262 Difficulty in walking, not elsewhere classified: Secondary | ICD-10-CM | POA: Insufficient documentation

## 2015-04-03 DIAGNOSIS — M25562 Pain in left knee: Secondary | ICD-10-CM | POA: Insufficient documentation

## 2015-04-03 DIAGNOSIS — R5381 Other malaise: Secondary | ICD-10-CM

## 2015-04-03 DIAGNOSIS — R2681 Unsteadiness on feet: Secondary | ICD-10-CM | POA: Insufficient documentation

## 2015-04-03 DIAGNOSIS — M545 Low back pain, unspecified: Secondary | ICD-10-CM

## 2015-04-03 DIAGNOSIS — M25561 Pain in right knee: Secondary | ICD-10-CM | POA: Diagnosis not present

## 2015-04-03 NOTE — Patient Instructions (Signed)
1) Shoulder Protraction    Begin with elbows by your side, slowly "punch" straight out in front of you keeping arms/elbows straight.      2) Shoulder Flexion  Supine:     Standing:         Begin with arms at your side with thumbs pointed up, slowly raise both arms up and forward towards overhead.               3) Horizontal abduction/adduction  Supine:   Standing:           Begin with arms straight out in front of you, bring out to the side in at "T" shape. Keep arms straight entire time.                 4) Internal & External Rotation    *No band* -Stand with elbows at the side and elbows bent 90 degrees. Move your forearms away from your body, then bring back inward toward the body.     5) Shoulder Abduction  Supine:     Standing:       Lying on your back begin with your arms flat on the table next to your side. Slowly move your arms out to the side so that they go overhead, in a jumping jack or snow angel movement.    6) X to V arms (cheerleader move):  Begin with arms straight down, crossed in front of body in an "X". Keeping arms crossed, lift arms straight up overhead. Then spread arms apart into a "V" shape.  Bring back together into x and lower down to starting position.        Repeat all exercises 10-15 times, 1-2 times per day.  

## 2015-04-03 NOTE — Therapy (Signed)
[facility] [address] [place], [place], [address] Phone: [phone]   Fax:  [phone]  Physical Therapy Evaluation  Patient Details  Name: Jennifer [name] MRN: [id number] Date of Birth: [birth year] Referring Provider: Dr. Syliva OvermanMargaret Simpson  Encounter Date: 04/03/2015      PT End of Session - 04/03/15 1447    Visit Number 1   Number of Visits 22   Date for PT Re-Evaluation 05/03/15   Authorization Type Medicare    Authorization Time Period 04/03/2015 to 05/22/2015   PT Start Time 1431   PT Stop Time 1515   PT Time Calculation (min) 44 min   Equipment Utilized During Treatment Gait belt   Activity Tolerance Patient tolerated treatment well   Behavior [facility] assessed/performed      Past Medical History  Diagnosis Date  . Chest pain 2006  . COPD (chronic obstructive pulmonary disease) (HCC)   . Hyperlipidemia   . Osteoarthritis     status post left TKA; surgery on the right is anticipated in the near future   . Nicotine addiction   . Gastroesophageal reflux disease   . Hiatal hernia   . Anemia     iron deficiency post op.  . Chronic back pain   . Pulmonary nodules     stable since 2006  . Cellulitis of finger     right   . Shingles     right breast   . Ulcer     gential  . Skin infection   . HTN (hypertension) 04/22/2014  . CAD (coronary artery disease) 2008    Cath 40% mid LAD, 35% RCA  . Use of cane as ambulatory aid     Past Surgical History  Procedure Laterality Date  . Cholecystectomy    . Combined hysterectomy abdominal w/ a&p repair / oophorectomy  approx. 40 years ago   . Mastectomy  1998 & 2007     right -1998 / left 2007  . Total knee arthroplasty  4/08    left   . Right knee replacement  09/2009    Dr. Romeo AppleHarrison  . Abdominal hysterectomy    . Breast surgery N/A     bilateral  . Cardiac catheterization  2008    Mid LAD 40%, RCA 25%.     There were no vitals filed for this  visit.  Visit Diagnosis:  Unsteadiness - Plan: PT plan of care cert/re-cert  Difficulty walking - Plan: PT plan of care cert/re-cert  Weakness of both legs - Plan: PT plan of care cert/re-cert  Bilateral low back pain without sciatica - Plan: PT plan of care cert/re-cert  Knee pain, bilateral - Plan: PT plan of care cert/re-cert      Subjective Assessment - 04/03/15 1438    Subjective Jennifer [name] is a 82 yo female who currently c/o unsteady balance, low back pain, and B knee pain. Pt reported " I've had back and knee pain for awhile now". Pt noted that her LBP is worse than her knee pain, which limits her ability to ambulate long distances.    Pertinent History HTN, COPD, OA, osteoporosis   How long can you sit comfortably? Unlimited    How long can you stand comfortably? Unknown    How long can you walk comfortably? Unknown    Patient Stated Goals Pt's goal is to improve her balance, strength, and reduce her pain.    Currently in Pain? Yes   Pain  Score 4   pain ranges between 0-7/10 on a VAS   Pain Location Back   Pain Orientation Lower   Pain Descriptors / Indicators Aching   Pain Type Chronic pain   Pain Onset More than a month ago   Pain Frequency Intermittent   Aggravating Factors  walking, standing, and sit<>stand transition    Pain Relieving Factors rest, pain meds, and sitting    Effect of Pain on Daily Activities Difficulty with long distance ambulation    Multiple Pain Sites Yes   Pain Score 4  pain ranges between 0-7/10 on a VAS   Pain Location Knee   Pain Orientation Right;Left;Anterior   Pain Descriptors / Indicators Aching   Pain Type Chronic pain   Pain Radiating Towards None    Pain Onset More than a month ago   Pain Frequency Intermittent   Aggravating Factors  WB activities, walking, and standing   Pain Relieving Factors rest, pain meds, and sitting    Effect of Pain on Daily Activities Difficulty with long distance ambulation and prolongd standing with  ADLs          [facility] PT Assessment - 04/03/15 1451    Assessment   Medical Diagnosis Unsteady Gait and balance   Referring Provider Dr. Syliva Overman   Onset Date/Surgical Date 01/13/15   Hand Dominance Right   Next MD Visit Unknown, pt unable to recall    Prior Therapy Unknown, pt unable to recall    Precautions   Precautions Fall   Restrictions   Weight Bearing Restrictions No   Balance Screen   Has the patient fallen in the past 6 months No   Has the patient had a decrease in activity level because of a fear of falling?  Yes   Is the patient reluctant to leave their home because of a fear of falling?  No   Home Environment   Living Environment Private residence   Living Arrangements Children   Available Help at Discharge Family   Type of Home House   Home Access Level entry   Home Layout One level   Home Equipment [place] - single point;Walker - 4 wheels;Wheelchair - manual   Prior Function   Level of Independence Independent with basic ADLs;Needs assistance with homemaking   Vocation Retired   IT consultant   Overall Cognitive Status Impaired/Different from baseline  alert and oriented to place and person    Observation/Other Assessments   Focus on Therapeutic Outcomes (FOTO)  35 % limited    AROM   Overall AROM  Within functional limits for tasks performed   Overall AROM Comments B LE assesed  in supine    Strength   Strength Assessment Site Hip;Knee;Ankle   Right/Left Shoulder --   Right/Left Hip Right;Left   Right Hip Flexion 3/5   Right Hip ABduction 3-/5   Left Hip Flexion 3/5   Left Hip ABduction 3-/5   Right/Left Knee Right;Left   Right Knee Flexion 3+/5   Right Knee Extension 4-/5   Left Knee Flexion 3+/5   Left Knee Extension 4-/5   Right/Left Ankle Right;Left   Right Ankle Dorsiflexion 4+/5   Right Ankle Plantar Flexion 3-/5   Right Ankle Inversion 4/5   Right Ankle Eversion 4/5   Left Ankle Dorsiflexion 4+/5   Left Ankle Plantar Flexion 3-/5    Left Ankle Inversion 4/5   Left Ankle Eversion 4/5   Transfers   Transfers Sit to Stand;Stand to Sit   Sit to  Stand 5: Supervision   Five time sit to stand comments  24 seconds   Stand to Sit 6: Modified independent (Device/Increase time);5: Supervision   Comments requires B UE assist to complete all transfers   Ambulation/Gait   Ambulation/Gait Yes   Ambulation/Gait Assistance 4: Min guard   Ambulation Distance (Feet) 50 Feet   Assistive device Straight cane;Rolling walker   Gait Pattern Poor foot clearance - left;Poor foot clearance - right;Shuffle;Trunk flexed;Step-through pattern  slow cadence; decreased heel strikes    Ambulation Surface Level   Gait Comments DOE assessed with gait assessment    Timed Up and Go Test   Normal TUG (seconds) 37   TUG Comments FWW   High Level Balance   High Level Balance Comments Romberg with EO= 6 sec with LOB/ Romberg with EC= 1 sec with LOB              Quick Dash - 04/03/15 0001    Open a tight or new jar No difficulty   Do heavy household chores (wash walls, wash floors) No difficulty   Carry a shopping bag or briefcase No difficulty   Wash your back No difficulty   Use a knife to cut food No difficulty   Recreational activities in which you take some force or impact through your arm, shoulder, or hand (golf, hammering, tennis) No difficulty   During the past week, to what extent has your arm, shoulder or hand problem interfered with your normal social activities with family, friends, neighbors, or groups? Not at all   During the past week, to what extent has your arm, shoulder or hand problem limited your work or other regular daily activities Not at all   Arm, shoulder, or hand pain. Moderate   Tingling (pins and needles) in your arm, shoulder, or hand None   Difficulty Sleeping No difficulty   DASH Score 4.55 %                     PT Education - 04/03/15 1446    Education Details PT eval findings, 5/5 fall  precautions, pursed lip breathing, and recommendation for FWW use with ambulation   Person(s) Educated Patient   Methods Explanation   Comprehension Verbalized understanding          PT Short Term Goals - 04/03/15 1827    PT SHORT TERM GOAL #1   Title Patient/family will independently verbalize and demo proper completion of her initial HEP to continue with LE strengthening at home.    Time 3   Period Weeks   Status New   PT SHORT TERM GOAL #2   Title Patient will independently verbalize 5/5 fall precautions in order to reduce the risk for falls with household ambulation.   Time 3   Period Weeks   Status New   PT SHORT TERM GOAL #3   Title Patient will be able to complete 5 sit to stands in <15 seconds in order to improve performance and ease with transfers.   Time 3   Period Weeks   Status New   PT SHORT TERM GOAL #4   Title Patient will be able to maintain Romberg with EO on level ground for >30 seconds in order to reduce the risk for falls with dressing ADLs.    Time 4   Period Weeks   Status New           PT Long Term Goals - 04/03/15 1828    PT  LONG TERM GOAL #1   Title Patient/family will independently verbalize and demo proper completion of her advanced HEP in order to continue with LE strengthening at home once DC from PT.    Time 7   Period Weeks   Status New   PT LONG TERM GOAL #2   Title Patient will be able to independently ambulate >350' on level terrain with a FWW with improved heel to toe sequence and cadence in order to progress towards improved community ambulation.   Time 7   Period Weeks   Status New   PT LONG TERM GOAL #3   Title Patient will present with improved B LE strength to >4-/5 MMT grade in order to improve performance with sit<>stand transfers.    Time 7   Period Weeks   Status New   PT LONG TERM GOAL #4   Title TUG time will improve to <20 seconds with the use of an AD in order to reduce the risk for falls with household ambulation.     Time 7   Period Weeks   Status New               Plan - 04/27/15 1448    Clinical Impression Statement Jennifer [name] is a, A & O X 2, 82 yo female who was referred to outpatient PT secondary to unsteady gait and balance. The pt presents with signs and symptoms that are consistent with MD referral of unsteady gait and balance. The pt is at a high risk for falls as evident by TUG time of 37 seconds with the use of a FWW. Furthermore, she presented with LOB with Romberg with EO at 6 sec and Romberg with EC at 1 sec. The pt arrived to PT clinic ambulating with a SPC with slow cadence, shuffling gait pattern, and forward flexed posture. Therapist recommended use of FWW rather than SPC. Improved gait pattern and less physical exertion assessed and reported with trial of FWW around clinic. The pt required frequent breaks t/o today's PT eval secondary to DOE. The pt currently presents with limitations and impairments including B LE weakness (specifically hip weakness), B knee pain, central low back pain, impaired static/dynamic standing balance, difficulty with transfers, impaired endurance, and unsteady gait with gait deviations. The pt would benefit from skilled PT to address current deficits in order to reduce her risk for falls and improve her ability to complete functional mobility activities with less difficulty. The pt is in agreement with proposed PT POC and frequency.    Pt will benefit from skilled therapeutic intervention in order to improve on the following deficits Decreased strength;Abnormal gait;Postural dysfunction;Difficulty walking;Decreased activity tolerance;Decreased mobility;Decreased balance;Decreased safety awareness;Pain;Decreased endurance   Rehab Potential Fair   PT Frequency 3x / week   PT Duration --  7 weeks    PT Treatment/Interventions ADLs/Self Care Home Management;Cryotherapy;Moist Heat;DME Instruction;Gait training;Stair training;Functional mobility  training;Therapeutic activities;Therapeutic exercise;Balance training;Neuromuscular re-education;Patient/family education;Passive range of motion;Manual techniques   PT Next Visit Plan Next visit to focus on basic seated hip/knee strengthening ther ex, static balance training, and pt education    PT Home Exercise Plan HEP not initiated this visit. Will plan to provide HEP at next PT visit with addition of hip strengthening ther ex    Recommended Other Services None at this time    Consulted and Agree with Plan of Care Patient          G-Codes - 2015/04/27 1838    Functional Assessment Tool Used Based on  clinical findings including TUG time, 5 sit to stand time, MMT, pain, gait quality, and ability to complete transfers   Functional Limitation Mobility: Walking and moving around   Mobility: Walking and Moving Around Current Status [phone]) At least 60 percent but less than 80 percent impaired, limited or restricted   Mobility: Walking and Moving Around Goal Status [phone]) At least 40 percent but less than 60 percent impaired, limited or restricted       Problem List Patient Active Problem List   Diagnosis Date Noted  . Special screening for malignant neoplasms, colon 09/30/2014  . DNR no code (do not resuscitate) 04/25/2014  . HTN (hypertension) 04/22/2014  . Urinary incontinence 04/04/2013  . Osteoporosis, post-menopausal 12/06/2012  . At high risk for falls 07/30/2012  . MCI (mild cognitive impairment) 08/19/2011  . COPD (chronic obstructive pulmonary disease) (HCC) 06/25/2011  . CEREBROVASCULAR DISEASE 08/05/2009  . SPINAL STENOSIS 06/09/2007  . TOBACCO ABUSE 01/25/2007  . Coronary atherosclerosis 01/25/2007  . HIATAL HERNIA WITH REFLUX 01/25/2007  . Osteoarthritis 01/25/2007    Bonnee Quin, PT, DPT   04/03/2015, 6:44 PM  [facility] [address] [place], [place], [address] Phone: [phone]   Fax:  [phone]  Name: Jennifer [name] MRN: [id number] Date of Birth: Oct 05, 1922

## 2015-04-03 NOTE — Therapy (Signed)
[facility] [address] [place], [place], [address] Phone: [phone]   Fax:  [phone]  Occupational Therapy Evaluation  Patient Details  Name: Jennifer [name] MRN: [id number] Date of Birth: 04-21-1922 Referring Provider: Syliva Overman, MD  Encounter Date: 04/03/2015      OT End of Session - 04/03/15 1443    Visit Number 1   Number of Visits 1   Authorization Type Medicare   OT Start Time 1355   OT Stop Time 1430   OT Time Calculation (min) 35 min   Activity Tolerance Patient tolerated treatment well   Behavior During Therapy [facility] for tasks assessed/performed      Past Medical History  Diagnosis Date  . Chest pain 2006  . COPD (chronic obstructive pulmonary disease) (HCC)   . Hyperlipidemia   . Osteoarthritis     status post left TKA; surgery on the right is anticipated in the near future   . Nicotine addiction   . Gastroesophageal reflux disease   . Hiatal hernia   . Anemia     iron deficiency post op.  . Chronic back pain   . Pulmonary nodules     stable since 2006  . Cellulitis of finger     right   . Shingles     right breast   . Ulcer     gential  . Skin infection   . HTN (hypertension) 04/22/2014  . CAD (coronary artery disease) 2008    Cath 40% mid LAD, 35% RCA  . Use of cane as ambulatory aid     Past Surgical History  Procedure Laterality Date  . Cholecystectomy    . Combined hysterectomy abdominal w/ a&p repair / oophorectomy  approx. 40 years ago   . Mastectomy  1998 & 2007     right -1998 / left 2007  . Total knee arthroplasty  4/08    left   . Right knee replacement  09/2009    Dr. Romeo Apple  . Abdominal hysterectomy    . Breast surgery N/A     bilateral  . Cardiac catheterization  2008    Mid LAD 40%, RCA 25%.     There were no vitals filed for this visit.  Visit Diagnosis:  Physical deconditioning - Plan: Ot plan of care cert/re-cert      Subjective Assessment - 04/03/15 1439     Subjective  S: I'm just here keeping my appointment. I have back pain and both my knees hurt.    Pertinent History Patient is a 82 y/o female arriving to the clinic today for OT evaluation with a diagnosis of increased falls and deconditioning. Pt had a recent fall at home when she tripped on her porch step. Dr. Lodema Hong has referred patient to occupational therapy for evaluation and treatment.    Special Tests Quick DASH    Patient Stated Goals None stated.    Currently in Pain? Yes  Pt reports pain in lower back and bilateral knees. No number given.            [facility] OT Assessment - 04/03/15 1348    Assessment   Diagnosis generalized weakness   Referring Provider Jennifer Overman, MD   Onset Date --  November 2016 - increased falls   Prior Therapy Pt received PT for back pain in 2012   Precautions   Precautions Fall   Restrictions   Weight Bearing Restrictions No   Balance Screen   Has the patient  fallen in the past 6 months Yes   How many times? 1   Has the patient had a decrease in activity level because of a fear of falling?  No   Is the patient reluctant to leave their home because of a fear of falling?  No   Home  Environment   Family/patient expects to be discharged to: Private residence   Prior Function   Level of Independence Independent with community mobility with device;Independent with household mobility with device;Needs assistance with homemaking   Vocation Retired   ADL   ADL comments Patient reports no difficulty completing daily tasks. Pt reports that daughter completes meal prep.    Mobility   Mobility Status History of falls   Written Expression   Dominant Hand Right   Vision - History   Baseline Vision Wears glasses all the time   Cognition   Overall Cognitive Status Within Functional Limits for tasks assessed   Observation/Other Assessments   Other Surveys  Select   ROM / Strength   AROM / PROM / Strength AROM;Strength   AROM   Overall AROM   Within functional limits for tasks performed   Overall AROM Comments BUE assessed in seated.   AROM Assessment Site Shoulder;Elbow;Forearm;Wrist   Strength   Overall Strength Comments Assessed seated. IR/er adducted.    Strength Assessment Site Shoulder;Elbow;Hand   Right/Left Shoulder Right;Left   Right Shoulder Flexion 4-/5   Right Shoulder ABduction 4-/5   Right Shoulder Internal Rotation 4+/5   Right Shoulder External Rotation 3+/5   Left Shoulder Flexion 4-/5   Left Shoulder ABduction 4-/5   Left Shoulder Internal Rotation 4+/5   Left Shoulder External Rotation 3+/5   Right/Left Elbow Right;Left   Right Elbow Flexion 4/5   Right Elbow Extension 4-/5   Left Elbow Flexion 4/5   Left Elbow Extension 4-/5   Right/Left hand Right;Left   Right Hand Grip (lbs) 40   Right Hand Lateral Pinch 6 lbs   Right Hand 3 Point Pinch 6 lbs   Left Hand Grip (lbs) 35   Left Hand Lateral Pinch 6 lbs   Left Hand 3 Point Pinch 6 lbs             Quick Dash - 04/03/15 0001    Open a tight or new jar No difficulty   Do heavy household chores (wash walls, wash floors) No difficulty   Carry a shopping bag or briefcase No difficulty   Wash your back No difficulty   Use a knife to cut food No difficulty   Recreational activities in which you take some force or impact through your arm, shoulder, or hand (golf, hammering, tennis) No difficulty   During the past week, to what extent has your arm, shoulder or hand problem interfered with your normal social activities with family, friends, neighbors, or groups? Not at all   During the past week, to what extent has your arm, shoulder or hand problem limited your work or other regular daily activities Not at all   Arm, shoulder, or hand pain. Moderate   Tingling (pins and needles) in your arm, shoulder, or hand None   Difficulty Sleeping No difficulty   DASH Score 4.55 %                    OT Education - 04/03/15 1442    Education  provided Yes   Education Details Patient was given a general UB strengthening HEP to maintain her strength.  Discussed results of OT evaluation with daughter at the end of evaluation.    Person(s) Educated Patient;Child(ren)   Methods Explanation;Demonstration;Handout;Verbal cues   Comprehension Verbalized understanding          OT Short Term Goals - 04/03/15 1446    OT SHORT TERM GOAL #1   Title Patient will be educated and independent with HEP to maintain UB strength and allow for her to continue completing daily tasks at her fullest potential.    Time 1   Period Days   Status Achieved                  Plan - 04/03/15 1443    Clinical Impression Statement A: Patient is a 82 y/o female who reports not increased difficulty with ADL performance as well as decreased strength. Patient states that her only concerns today are her low back and bilateral knee pain. She states that her low back causes her shoulders to hurt at times. Strength and ROM assessed. Patient's BUE strength and ROM is WFL for her age. I do not recommend that patient receive OT services at this time. Patient will be given a UB strengthening HEP to maintain her strength at home.    Pt will benefit from skilled therapeutic intervention in order to improve on the following deficits (Retired) Other (comment)  Decreased knowledge of UB strengthening exercises.   Rehab Potential Excellent   OT Frequency One time visit   OT Treatment/Interventions Patient/family education   Plan P: One time visit with HEP.   Consulted and Agree with Plan of Care Patient;Family member/caregiver   Family Member Consulted Daughter          G-Codes - 04/03/15 1448    Functional Assessment Tool Used Quick DASH: 4.55   Functional Limitation Other OT primary   Other OT Primary Current Status ([id number]) At least 1 percent but less than 20 percent impaired, limited or restricted   Other OT Primary Goal Status ([id number]([id number]) At least 1 percent but  less than 20 percent impaired, limited or restricted   Other OT Primary Discharge Status [phone]([id number]) At least 1 percent but less than 20 percent impaired, limited or restricted      Problem List Patient Active Problem List   Diagnosis Date Noted  . Special screening for malignant neoplasms, colon 09/30/2014  . DNR no code (do not resuscitate) 04/25/2014  . HTN (hypertension) 04/22/2014  . Urinary incontinence 04/04/2013  . Osteoporosis, post-menopausal 12/06/2012  . At high risk for falls 07/30/2012  . MCI (mild cognitive impairment) 08/19/2011  . COPD (chronic obstructive pulmonary disease) (HCC) 06/25/2011  . CEREBROVASCULAR DISEASE 08/05/2009  . SPINAL STENOSIS 06/09/2007  . TOBACCO ABUSE 01/25/2007  . Coronary atherosclerosis 01/25/2007  . HIATAL HERNIA WITH REFLUX 01/25/2007  . Osteoarthritis 01/25/2007    Limmie PatriciaLaura Essenmacher, OTR/L,CBIS  [phone]  04/03/2015, 2:51 PM  [facility] [address] [place], [place], [address] Phone: [phone]   Fax:  [phone]  Name: Juanetta BeetsVelma M Modeste MRN: [id number] Date of Birth: 01-18-1922

## 2015-04-04 ENCOUNTER — Ambulatory Visit (HOSPITAL_COMMUNITY): Payer: Medicare Other | Admitting: Physical Therapy

## 2015-04-04 DIAGNOSIS — R2681 Unsteadiness on feet: Secondary | ICD-10-CM | POA: Diagnosis not present

## 2015-04-04 DIAGNOSIS — M545 Low back pain, unspecified: Secondary | ICD-10-CM

## 2015-04-04 DIAGNOSIS — R262 Difficulty in walking, not elsewhere classified: Secondary | ICD-10-CM

## 2015-04-04 DIAGNOSIS — R5381 Other malaise: Secondary | ICD-10-CM

## 2015-04-04 DIAGNOSIS — M25562 Pain in left knee: Secondary | ICD-10-CM

## 2015-04-04 DIAGNOSIS — R29898 Other symptoms and signs involving the musculoskeletal system: Secondary | ICD-10-CM | POA: Diagnosis not present

## 2015-04-04 DIAGNOSIS — M25561 Pain in right knee: Secondary | ICD-10-CM | POA: Diagnosis not present

## 2015-04-04 NOTE — Patient Instructions (Signed)
  Planarflexion using Stretch Band  Patient uses Stretch band to create tension with the ankle dorsiflexed. The patient then resists the force and places ankle into plantar flexion. repeat 15 times.       SEATED MARCHING  While seated in a chair, draw up your knee, set it down and then alternate to your other side. 2x10 with each leg.     CLAM SHELLS  While lying on your side with your knees bent, draw up the top knee while keeping contact of your feet together.  Do not let your pelvis roll back during the lifting movement. Repeat 10 times on each side.      LONG ARC QUAD - LAQ - HIGH SEAT  While seated with your knee in a bent position, slowly straighten your knee as you raise your foot upwards as shown. 2x10 with each leg

## 2015-04-04 NOTE — Therapy (Signed)
[facility] [address] [place], [place], [address] Phone: [phone]   Fax:  [phone]  Physical Therapy Treatment  Patient Details  Name: Jennifer [name] MRN: [id number] Date of Birth: 1922/02/01 Referring Provider: Dr. Syliva Overman  Encounter Date: 04/04/2015      PT End of Session - 04/04/15 0857    Visit Number 2   Number of Visits 22   Date for PT Re-Evaluation 05/03/15   Authorization Type Medicare    Authorization Time Period 04/03/2015 to 05/22/2015   PT Start Time 0800   PT Stop Time 0845   PT Time Calculation (min) 45 min   Equipment Utilized During Treatment Gait belt   Activity Tolerance Patient tolerated treatment well   Behavior During Therapy [facility] for tasks assessed/performed      Past Medical History  Diagnosis Date  . Chest pain 2006  . COPD (chronic obstructive pulmonary disease) (HCC)   . Hyperlipidemia   . Osteoarthritis     status post left TKA; surgery on the right is anticipated in the near future   . Nicotine addiction   . Gastroesophageal reflux disease   . Hiatal hernia   . Anemia     iron deficiency post op.  . Chronic back pain   . Pulmonary nodules     stable since 2006  . Cellulitis of finger     right   . Shingles     right breast   . Ulcer     gential  . Skin infection   . HTN (hypertension) 04/22/2014  . CAD (coronary artery disease) 2008    Cath 40% mid LAD, 35% RCA  . Use of cane as ambulatory aid     Past Surgical History  Procedure Laterality Date  . Cholecystectomy    . Combined hysterectomy abdominal w/ a&p repair / oophorectomy  approx. 40 years ago   . Mastectomy  1998 & 2007     right -1998 / left 2007  . Total knee arthroplasty  4/08    left   . Right knee replacement  09/2009    Dr. Romeo Apple  . Abdominal hysterectomy    . Breast surgery N/A     bilateral  . Cardiac catheterization  2008    Mid LAD 40%, RCA 25%.     There were no vitals filed for this  visit.  Visit Diagnosis:  Physical deconditioning  Unsteadiness  Difficulty walking  Weakness of both legs  Bilateral low back pain without sciatica  Knee pain, bilateral      Subjective Assessment - 04/04/15 0808    Subjective Pt notes that her back and knees are hurting today. She arrived using her [facility] stating she prefers that over her walker. A&O to person and place only.    Pertinent History HTN, COPD, OA, osteoporosis   Currently in Pain? Yes   Pain Score --  unable to rate            [facility] PT Assessment - 04/03/15 1451    Assessment   Medical Diagnosis Unsteady Gait and balance   Referring Provider Dr. Syliva Overman   Onset Date/Surgical Date 01/13/15   Hand Dominance Right   Next MD Visit Unknown, pt unable to recall    Prior Therapy Unknown, pt unable to recall    Precautions   Precautions Fall   Restrictions   Weight Bearing Restrictions No   Balance Screen   Has the patient fallen in the  past 6 months No   Has the patient had a decrease in activity level because of a fear of falling?  Yes   Is the patient reluctant to leave their home because of a fear of falling?  No   Home Environment   Living Environment Private residence   Living Arrangements Children   Available Help at Discharge Family   Type of Home House   Home Access Level entry   Home Layout One level   Home Equipment [place] - single point;Walker - 4 wheels;Wheelchair - manual   Prior Function   Level of Independence Independent with basic ADLs;Needs assistance with homemaking   Vocation Retired   IT consultant   Overall Cognitive Status Impaired/Different from baseline  alert and oriented to place and person    Observation/Other Assessments   Focus on Therapeutic Outcomes (FOTO)  35 % limited    AROM   Overall AROM  Within functional limits for tasks performed   Overall AROM Comments B LE assesed  in supine    Strength   Strength Assessment Site Hip;Knee;Ankle   Right/Left Shoulder --    Right/Left Hip Right;Left   Right Hip Flexion 3/5   Right Hip ABduction 3-/5   Left Hip Flexion 3/5   Left Hip ABduction 3-/5   Right/Left Knee Right;Left   Right Knee Flexion 3+/5   Right Knee Extension 4-/5   Left Knee Flexion 3+/5   Left Knee Extension 4-/5   Right/Left Ankle Right;Left   Right Ankle Dorsiflexion 4+/5   Right Ankle Plantar Flexion 3-/5   Right Ankle Inversion 4/5   Right Ankle Eversion 4/5   Left Ankle Dorsiflexion 4+/5   Left Ankle Plantar Flexion 3-/5   Left Ankle Inversion 4/5   Left Ankle Eversion 4/5   Transfers   Transfers Sit to Stand;Stand to Sit   Sit to Stand 5: Supervision   Five time sit to stand comments  24 seconds   Stand to Sit 6: Modified independent (Device/Increase time);5: Supervision   Comments requires B UE assist to complete all transfers   Ambulation/Gait   Ambulation/Gait Yes   Ambulation/Gait Assistance 4: Min guard   Ambulation Distance (Feet) 50 Feet   Assistive device Straight cane;Rolling walker   Gait Pattern Poor foot clearance - left;Poor foot clearance - right;Shuffle;Trunk flexed;Step-through pattern  slow cadence; decreased heel strikes    Ambulation Surface Level   Gait Comments DOE assessed with gait assessment    Timed Up and Go Test   Normal TUG (seconds) 37   TUG Comments FWW   High Level Balance   High Level Balance Comments Romberg with EO= 6 sec with LOB/ Romberg with EC= 1 sec with LOB                     [facility] Adult PT Treatment/Exercise - 04/04/15 0001    Ambulation/Gait   Ambulation/Gait Yes   Ambulation/Gait Assistance 4: Min guard   Ambulation Distance (Feet) 40 Feet   Assistive device Rolling walker   Gait Pattern Poor foot clearance - left;Poor foot clearance - right;Shuffle;Trunk flexed;Step-through pattern   Ambulation Surface Level   Gait Comments DOE ambulating from lobby to back treatment room using [facility]   Exercises   Exercises Knee/Hip;Ankle   Knee/Hip Exercises: Seated    Long Arc Quad Both;2 sets;10 reps  1#   Ball Squeeze 2x10   Clamshell with TheraBand Red  x10 reps   Marching 2 sets;10 reps;Both   Marching Limitations cuing  required to perform through full range.    Ankle Exercises: Supine   Other Supine Ankle Exercises plantar flexion with red TB 2x10 ea             Balance Exercises - 04/04/15 0833    Balance Exercises: Standing   Standing Eyes Opened Solid surface;Narrow base of support (BOS);2 reps;20 secs;5 reps  max 20 sec without UE support.    Tandem Stance Eyes open;1 rep;20 secs  Max 10 sec with each LE forward           PT Education - 04/04/15 0855    Education provided Yes   Education Details Pt was given copy of initial evaluation; reviewed pursed lip breathing technique with pt and her daughter; recommended FWW during ambulation for energy conservation; provided HEP    Person(s) Educated Patient;Child(ren)   Methods Explanation;Demonstration   Comprehension Verbalized understanding;Need further instruction          PT Short Term Goals - 04/03/15 1827    PT SHORT TERM GOAL #1   Title Patient/family will independently verbalize and demo proper completion of her initial HEP to continue with LE strengthening at home.    Time 3   Period Weeks   Status New   PT SHORT TERM GOAL #2   Title Patient will independently verbalize 5/5 fall precautions in order to reduce the risk for falls with household ambulation.   Time 3   Period Weeks   Status New   PT SHORT TERM GOAL #3   Title Patient will be able to complete 5 sit to stands in <15 seconds in order to improve performance and ease with transfers.   Time 3   Period Weeks   Status New   PT SHORT TERM GOAL #4   Title Patient will be able to maintain Romberg with EO on level ground for >30 seconds in order to reduce the risk for falls with dressing ADLs.    Time 4   Period Weeks   Status New           PT Long Term Goals - 04/03/15 1828    PT LONG TERM GOAL #1    Title Patient/family will independently verbalize and demo proper completion of her advanced HEP in order to continue with LE strengthening at home once DC from PT.    Time 7   Period Weeks   Status New   PT LONG TERM GOAL #2   Title Patient will be able to independently ambulate >350' on level terrain with a FWW with improved heel to toe sequence and cadence in order to progress towards improved community ambulation.   Time 7   Period Weeks   Status New   PT LONG TERM GOAL #3   Title Patient will present with improved B LE strength to >4-/5 MMT grade in order to improve performance with sit<>stand transfers.    Time 7   Period Weeks   Status New   PT LONG TERM GOAL #4   Title TUG time will improve to <20 seconds with the use of an AD in order to reduce the risk for falls with household ambulation.    Time 7   Period Weeks   Status New               Plan - 04/04/15 0857    Clinical Impression Statement Pt arrived with her daughter using SPC. She demonstrated DOE after ambulating from the lobby to the back treatment room ~44ft  and her SaO2 dropped to the low 80s. She was instructed in pursed lip breathing, and after several minutes her SaO2 improved to 95%. She requires max verbal cuing for correct sequencing of her breathing. Session focused on therex and balance activity to improve her LE strength and overall safety with activity. SaO2 was monitored throughout the session and she required several rest breaks during her session. Will continue current POC addressing pt education, gait training, balance activity and LE strengthening to improve activity tolerance.    Pt will benefit from skilled therapeutic intervention in order to improve on the following deficits Decreased strength;Abnormal gait;Postural dysfunction;Difficulty walking;Decreased activity tolerance;Decreased mobility;Decreased balance;Decreased safety awareness;Pain;Decreased endurance   Rehab Potential Fair   PT  Frequency 3x / week   PT Duration --  7 weeks    PT Treatment/Interventions ADLs/Self Care Home Management;Cryotherapy;Moist Heat;DME Instruction;Gait training;Stair training;Functional mobility training;Therapeutic activities;Therapeutic exercise;Balance training;Neuromuscular re-education;Patient/family education;Passive range of motion;Manual techniques   PT Next Visit Plan continue to progress basic seated hip/knee strengthening ther ex, static balance training, and pt education (pt with poor tolerance to supine position)   PT Home Exercise Plan HEP initiated this visit. Updated as needed   Consulted and Agree with Plan of Care Patient          G-Codes - 04/03/15 1838    Functional Assessment Tool Used Based on clinical findings including TUG time, 5 sit to stand time, MMT, pain, gait quality, and ability to complete transfers   Functional Limitation Mobility: Walking and moving around   Mobility: Walking and Moving Around Current Status [phone]([id number]) At least 60 percent but less than 80 percent impaired, limited or restricted   Mobility: Walking and Moving Around Goal Status [phone]([id number]) At least 40 percent but less than 60 percent impaired, limited or restricted      Problem List Patient Active Problem List   Diagnosis Date Noted  . Special screening for malignant neoplasms, colon 09/30/2014  . DNR no code (do not resuscitate) 04/25/2014  . HTN (hypertension) 04/22/2014  . Urinary incontinence 04/04/2013  . Osteoporosis, post-menopausal 12/06/2012  . At high risk for falls 07/30/2012  . MCI (mild cognitive impairment) 08/19/2011  . COPD (chronic obstructive pulmonary disease) (HCC) 06/25/2011  . CEREBROVASCULAR DISEASE 08/05/2009  . SPINAL STENOSIS 06/09/2007  . TOBACCO ABUSE 01/25/2007  . Coronary atherosclerosis 01/25/2007  . HIATAL HERNIA WITH REFLUX 01/25/2007  . Osteoarthritis 01/25/2007   9:10 AM,04/04/2015 Marylyn IshiharaSara Kiser PT, DPT [facility] [phone]  [facility] [address] [place], [place], [address] Phone: [phone]   Fax:  [phone]  Name: Jennifer [name] MRN: [id number] Date of Birth: 1922/10/23

## 2015-04-10 ENCOUNTER — Ambulatory Visit (HOSPITAL_COMMUNITY): Payer: Medicare Other

## 2015-04-10 DIAGNOSIS — M25561 Pain in right knee: Secondary | ICD-10-CM

## 2015-04-10 DIAGNOSIS — R262 Difficulty in walking, not elsewhere classified: Secondary | ICD-10-CM | POA: Diagnosis not present

## 2015-04-10 DIAGNOSIS — M545 Low back pain, unspecified: Secondary | ICD-10-CM

## 2015-04-10 DIAGNOSIS — R29898 Other symptoms and signs involving the musculoskeletal system: Secondary | ICD-10-CM

## 2015-04-10 DIAGNOSIS — M25562 Pain in left knee: Secondary | ICD-10-CM | POA: Diagnosis not present

## 2015-04-10 DIAGNOSIS — R5381 Other malaise: Secondary | ICD-10-CM

## 2015-04-10 DIAGNOSIS — R2681 Unsteadiness on feet: Secondary | ICD-10-CM

## 2015-04-10 NOTE — Therapy (Signed)
[facility] [address] [place], [place], [address] Phone: [phone]   Fax:  [phone]  Physical Therapy Treatment  Patient Details  Name: Jennifer [name] MRN: [id number] Date of Birth: [birth year] Referring Provider: Dr. Syliva Overman  Encounter Date: 04/10/2015      PT End of Session - 04/10/15 0930    Visit Number 3   Number of Visits 22   Date for PT Re-Evaluation 05/03/15   Authorization Type Medicare    Authorization Time Period 04/03/2015 to 05/22/2015   PT Start Time 0848   PT Stop Time 0930   PT Time Calculation (min) 42 min   Equipment Utilized During Treatment Gait belt   Activity Tolerance Patient tolerated treatment well;Patient limited by fatigue   Behavior During Therapy [facility] for tasks assessed/performed      Past Medical History  Diagnosis Date  . Chest pain 2006  . COPD (chronic obstructive pulmonary disease) (HCC)   . Hyperlipidemia   . Osteoarthritis     status post left TKA; surgery on the right is anticipated in the near future   . Nicotine addiction   . Gastroesophageal reflux disease   . Hiatal hernia   . Anemia     iron deficiency post op.  . Chronic back pain   . Pulmonary nodules     stable since 2006  . Cellulitis of finger     right   . Shingles     right breast   . Ulcer     gential  . Skin infection   . HTN (hypertension) 04/22/2014  . CAD (coronary artery disease) 2008    Cath 40% mid LAD, 35% RCA  . Use of cane as ambulatory aid     Past Surgical History  Procedure Laterality Date  . Cholecystectomy    . Combined hysterectomy abdominal w/ a&p repair / oophorectomy  approx. 40 years ago   . Mastectomy  1998 & 2007     right -1998 / left 2007  . Total knee arthroplasty  4/08    left   . Right knee replacement  09/2009    Dr. Romeo Apple  . Abdominal hysterectomy    . Breast surgery N/A     bilateral  . Cardiac catheterization  2008    Mid LAD 40%, RCA 25%.     There were  no vitals filed for this visit.  Visit Diagnosis:  Physical deconditioning  Unsteadiness  Difficulty walking  Weakness of both legs  Bilateral low back pain without sciatica  Knee pain, bilateral      Subjective Assessment - 04/10/15 0852    Subjective Pt entered dept ambulating with [facility], stated she owns a walker but prefers SPC.  Pt stated she has moderate lower back and knee             [facility] Adult PT Treatment/Exercise - 04/10/15 0001    Ambulation/Gait   Ambulation/Gait Yes   Ambulation/Gait Assistance 4: Min guard   Ambulation Distance (Feet) 82 Feet     Assistive device Rolling walker   Gait Pattern Poor foot clearance - left;Poor foot clearance - right;Shuffle;Trunk flexed;Step-through pattern   Ambulation Surface Level   Gait Comments DOE ambulating from lobby to back treatment room using [facility]   Knee/Hip Exercises: Standing   Heel Raises 2 sets;10 reps   Heel Raises Limitations Toe raises 2x 10   Knee/Hip Exercises: Seated   Long Arc Quad Both;2 sets;10 reps   Long  Arc Quad Weight 1 lbs.   Ball Squeeze 2x10   Clamshell with TheraBand Red   Knee/Hip Flexion hamstring curls with RTB 2x 10   Marching 2 sets;10 reps;Both   Marching Limitations cuing required to perform through full range.              Balance Exercises - 04/10/15 0937    Balance Exercises: Standing   Standing Eyes Opened Solid surface;Narrow base of support (BOS);2 reps;20 secs;5 reps   Tandem Stance Eyes open;1 rep;20 secs  10 sec with LE forward             PT Short Term Goals - 04/03/15 1827    PT SHORT TERM GOAL #1   Title Patient/family will independently verbalize and demo proper completion of her initial HEP to continue with LE strengthening at home.    Time 3   Period Weeks   Status New   PT SHORT TERM GOAL #2   Title Patient will independently verbalize 5/5 fall precautions in order to reduce the risk for falls with household ambulation.   Time 3   Period  Weeks   Status New   PT SHORT TERM GOAL #3   Title Patient will be able to complete 5 sit to stands in <15 seconds in order to improve performance and ease with transfers.   Time 3   Period Weeks   Status New   PT SHORT TERM GOAL #4   Title Patient will be able to maintain Romberg with EO on level ground for >30 seconds in order to reduce the risk for falls with dressing ADLs.    Time 4   Period Weeks   Status New           PT Long Term Goals - 04/03/15 1828    PT LONG TERM GOAL #1   Title Patient/family will independently verbalize and demo proper completion of her advanced HEP in order to continue with LE strengthening at home once DC from PT.    Time 7   Period Weeks   Status New   PT LONG TERM GOAL #2   Title Patient will be able to independently ambulate >350' on level terrain with a FWW with improved heel to toe sequence and cadence in order to progress towards improved community ambulation.   Time 7   Period Weeks   Status New   PT LONG TERM GOAL #3   Title Patient will present with improved B LE strength to >4-/5 MMT grade in order to improve performance with sit<>stand transfers.    Time 7   Period Weeks   Status New   PT LONG TERM GOAL #4   Title TUG time will improve to <20 seconds with the use of an AD in order to reduce the risk for falls with household ambulation.    Time 7   Period Weeks   Status New               Plan - 04/10/15 1145    Clinical Impression Statement [facility], she demonstrated DOE after ambulating 50 feet and required seated rest break with diaphragmatic breathing instructed to raises SaO2.  Gait training complete with RW with abiltiy to ambulate 82 feet prior request for rest breaks, pt able to keep SaO2 above 94%.  Pt requested gait training with SPC at end of session, following 50 feet O2 sat reduced to 88%.  Pt educated on benefits of ambulating  with RW to improve activitity tolerance  and keep  SaO2 WNL.  Pt required mod assistance with NBOS balance activities to reduce risk of fall.  No reports of pain throgh session, was limited by fatigue requiring multiple seated rest breaks.  Current PT POC remains appropriate addressing pt. education, gait training with appropraite AD, balance training and LE strengthening.     Pt will benefit from skilled therapeutic intervention in order to improve on the following deficits Decreased strength;Abnormal gait;Postural dysfunction;Difficulty walking;Decreased activity tolerance;Decreased mobility;Decreased balance;Decreased safety awareness;Pain;Decreased endurance   Rehab Potential Fair   PT Duration --  7 weeks   PT Treatment/Interventions ADLs/Self Care Home Management;Cryotherapy;Moist Heat;DME Instruction;Gait training;Stair training;Functional mobility training;Therapeutic activities;Therapeutic exercise;Balance training;Neuromuscular re-education;Patient/family education;Passive range of motion;Manual techniques   PT Next Visit Plan continue to progress basic seated hip/knee strengthening ther ex, static balance training, and pt education (pt with poor tolerance to supine position)        Problem List Patient Active Problem List   Diagnosis Date Noted  . Special screening for malignant neoplasms, colon 09/30/2014  . DNR no code (do not resuscitate) 04/25/2014  . HTN (hypertension) 04/22/2014  . Urinary incontinence 04/04/2013  . Osteoporosis, post-menopausal 12/06/2012  . At high risk for falls 07/30/2012  . MCI (mild cognitive impairment) 08/19/2011  . COPD (chronic obstructive pulmonary disease) (HCC) 06/25/2011  . CEREBROVASCULAR DISEASE 08/05/2009  . SPINAL STENOSIS 06/09/2007  . TOBACCO ABUSE 01/25/2007  . Coronary atherosclerosis 01/25/2007  . HIATAL HERNIA WITH REFLUX 01/25/2007  . Osteoarthritis 01/25/2007   Becky Sax, LPTA; CBIS [phone]  Juel Burrow 04/10/2015, 11:56 AM  [facility] [address] [place], [place], [address] Phone: [phone]   Fax:  [phone]  Name: MC HOLLEN MRN: [id number] Date of Birth: October 14, 1922

## 2015-04-11 ENCOUNTER — Ambulatory Visit (HOSPITAL_COMMUNITY): Payer: Medicare Other | Admitting: Physical Therapy

## 2015-04-11 DIAGNOSIS — R262 Difficulty in walking, not elsewhere classified: Secondary | ICD-10-CM | POA: Diagnosis not present

## 2015-04-11 DIAGNOSIS — M545 Low back pain, unspecified: Secondary | ICD-10-CM

## 2015-04-11 DIAGNOSIS — R29898 Other symptoms and signs involving the musculoskeletal system: Secondary | ICD-10-CM | POA: Diagnosis not present

## 2015-04-11 DIAGNOSIS — M25562 Pain in left knee: Secondary | ICD-10-CM | POA: Diagnosis not present

## 2015-04-11 DIAGNOSIS — M25561 Pain in right knee: Secondary | ICD-10-CM | POA: Diagnosis not present

## 2015-04-11 DIAGNOSIS — R2681 Unsteadiness on feet: Secondary | ICD-10-CM | POA: Diagnosis not present

## 2015-04-11 DIAGNOSIS — R5381 Other malaise: Secondary | ICD-10-CM

## 2015-04-11 NOTE — Therapy (Signed)
[facility] [address] [place], [place], [address] Phone: [phone]   Fax:  [phone]  Physical Therapy Treatment  Patient Details  Name: Jennifer [name] MRN: [id number] Date of Birth: August 10, 1922 Referring Provider: Dr. Syliva Overman  Encounter Date: 04/11/2015      PT End of Session - 04/11/15 1642    Visit Number 4   Number of Visits 22   Date for PT Re-Evaluation 05/03/15   Authorization Type Medicare    Authorization Time Period 04/03/2015 to 05/22/2015   PT Start Time 1558   PT Stop Time 1637   PT Time Calculation (min) 39 min   Equipment Utilized During Treatment Gait belt   Activity Tolerance Patient tolerated treatment well;Patient limited by fatigue   Behavior During Therapy [facility] for tasks assessed/performed      Past Medical History  Diagnosis Date  . Chest pain 2006  . COPD (chronic obstructive pulmonary disease) (HCC)   . Hyperlipidemia   . Osteoarthritis     status post left TKA; surgery on the right is anticipated in the near future   . Nicotine addiction   . Gastroesophageal reflux disease   . Hiatal hernia   . Anemia     iron deficiency post op.  . Chronic back pain   . Pulmonary nodules     stable since 2006  . Cellulitis of finger     right   . Shingles     right breast   . Ulcer     gential  . Skin infection   . HTN (hypertension) 04/22/2014  . CAD (coronary artery disease) 2008    Cath 40% mid LAD, 35% RCA  . Use of cane as ambulatory aid     Past Surgical History  Procedure Laterality Date  . Cholecystectomy    . Combined hysterectomy abdominal w/ a&p repair / oophorectomy  approx. 40 years ago   . Mastectomy  1998 & 2007     right -1998 / left 2007  . Total knee arthroplasty  4/08    left   . Right knee replacement  09/2009    Dr. Romeo Apple  . Abdominal hysterectomy    . Breast surgery N/A     bilateral  . Cardiac catheterization  2008    Mid LAD 40%, RCA 25%.     There were  no vitals filed for this visit.  Visit Diagnosis:  Physical deconditioning  Unsteadiness  Difficulty walking  Weakness of both legs  Knee pain, bilateral  Bilateral low back pain without sciatica      Subjective Assessment - 04/11/15 1603    Subjective Pt states her back and knees are bothering her. She is unable to rate her pain at this time.    Patient is accompained by: Family member   Pertinent History HTN, COPD, OA, osteoporosis   Currently in Pain? Yes  unable to rate   Pain Location Back   Pain Orientation Lower   Pain Descriptors / Indicators Aching   Pain Type Chronic pain   Pain Onset More than a month ago   Pain Frequency Intermittent   Aggravating Factors  unsure, notices it late afternoon   Pain Relieving Factors rest    Effect of Pain on Daily Activities difficulty with long distances                         [facility] Adult PT Treatment/Exercise - 04/11/15 0001  Ambulation/Gait   Ambulation/Gait Yes   Ambulation/Gait Assistance 4: Min guard   Ambulation Distance (Feet) 80 Feet   Assistive device Rolling walker;Straight cane  40' with RW, 7640' with SPC   Gait Pattern Poor foot clearance - left;Poor foot clearance - right;Shuffle;Trunk flexed;Step-through pattern  improved foot clearance noted with RW   Ambulation Surface Level   Knee/Hip Exercises: Seated   Long Arc Quad Both;2 sets;10 reps   Long Arc Quad Weight 2 lbs.   Clamshell with TheraBand Red  x10 reps   Marching 2 sets;20 reps;Weights  #2   Sit to Sand 3 sets;5 reps;with UE support  28.46 sec/26.49 sec/ 27.58 sec                PT Education - 04/11/15 1641    Education provided Yes   Education Details Spoke with pt and her daughter about the importance of performing her HEP at home. Discussed use of RW for energy conservation; reviewed pursed lip breathing techniques    Person(s) Educated Patient;Child(ren)   Methods Explanation;Demonstration   Comprehension  Verbalized understanding;Need further instruction          PT Short Term Goals - 04/03/15 1827    PT SHORT TERM GOAL #1   Title Patient/family will independently verbalize and demo proper completion of her initial HEP to continue with LE strengthening at home.    Time 3   Period Weeks   Status New   PT SHORT TERM GOAL #2   Title Patient will independently verbalize 5/5 fall precautions in order to reduce the risk for falls with household ambulation.   Time 3   Period Weeks   Status New   PT SHORT TERM GOAL #3   Title Patient will be able to complete 5 sit to stands in <15 seconds in order to improve performance and ease with transfers.   Time 3   Period Weeks   Status New   PT SHORT TERM GOAL #4   Title Patient will be able to maintain Romberg with EO on level ground for >30 seconds in order to reduce the risk for falls with dressing ADLs.    Time 4   Period Weeks   Status New           PT Long Term Goals - 04/03/15 1828    PT LONG TERM GOAL #1   Title Patient/family will independently verbalize and demo proper completion of her advanced HEP in order to continue with LE strengthening at home once DC from PT.    Time 7   Period Weeks   Status New   PT LONG TERM GOAL #2   Title Patient will be able to independently ambulate >350' on level terrain with a FWW with improved heel to toe sequence and cadence in order to progress towards improved community ambulation.   Time 7   Period Weeks   Status New   PT LONG TERM GOAL #3   Title Patient will present with improved B LE strength to >4-/5 MMT grade in order to improve performance with sit<>stand transfers.    Time 7   Period Weeks   Status New   PT LONG TERM GOAL #4   Title TUG time will improve to <20 seconds with the use of an AD in order to reduce the risk for falls with household ambulation.    Time 7   Period Weeks   Status New  Plan - 04/11/15 1643    Clinical Impression Statement Pt  arrived with her SPC this session. Therapist discussed use of RW during ambulation for improved foot clearance and energy conservation with pt verbalizing understanding. Pt demonstrating increased difficulty with sit to stand activity this session secondary to continued LE weakness and poor endurance. Will continue with current POC addressing endurance strength and mobility.   Pt will benefit from skilled therapeutic intervention in order to improve on the following deficits Decreased strength;Abnormal gait;Postural dysfunction;Difficulty walking;Decreased activity tolerance;Decreased mobility;Decreased balance;Decreased safety awareness;Pain;Decreased endurance   Rehab Potential Fair   PT Duration --  7 weeks   PT Treatment/Interventions ADLs/Self Care Home Management;Cryotherapy;Moist Heat;DME Instruction;Gait training;Stair training;Functional mobility training;Therapeutic activities;Therapeutic exercise;Balance training;Neuromuscular re-education;Patient/family education;Passive range of motion;Manual techniques   PT Next Visit Plan Balance training next session; continue to progress basic seated hip/knee strengthening ther ex, pt education for pursed lip breathing   PT Home Exercise Plan no updates this session   Consulted and Agree with Plan of Care Patient        Problem List Patient Active Problem List   Diagnosis Date Noted  . Special screening for malignant neoplasms, colon 09/30/2014  . DNR no code (do not resuscitate) 04/25/2014  . HTN (hypertension) 04/22/2014  . Urinary incontinence 04/04/2013  . Osteoporosis, post-menopausal 12/06/2012  . At high risk for falls 07/30/2012  . MCI (mild cognitive impairment) 08/19/2011  . COPD (chronic obstructive pulmonary disease) (HCC) 06/25/2011  . CEREBROVASCULAR DISEASE 08/05/2009  . SPINAL STENOSIS 06/09/2007  . TOBACCO ABUSE 01/25/2007  . Coronary atherosclerosis 01/25/2007  . HIATAL HERNIA WITH REFLUX 01/25/2007  . Osteoarthritis  01/25/2007   5:38 PM,04/11/2015 Marylyn Ishihara PT, DPT [facility] [phone]  [facility] [address] [place], [place], [address] Phone: [phone]   Fax:  [phone]  Name: TAKEYSHA BONK MRN: [id number] Date of Birth: [birth year]

## 2015-04-16 ENCOUNTER — Ambulatory Visit (HOSPITAL_COMMUNITY): Payer: Medicare Other | Attending: Family Medicine | Admitting: Physical Therapy

## 2015-04-16 DIAGNOSIS — R2681 Unsteadiness on feet: Secondary | ICD-10-CM

## 2015-04-16 DIAGNOSIS — R5381 Other malaise: Secondary | ICD-10-CM | POA: Diagnosis not present

## 2015-04-16 DIAGNOSIS — R262 Difficulty in walking, not elsewhere classified: Secondary | ICD-10-CM | POA: Insufficient documentation

## 2015-04-16 DIAGNOSIS — M25562 Pain in left knee: Secondary | ICD-10-CM | POA: Diagnosis not present

## 2015-04-16 DIAGNOSIS — M6281 Muscle weakness (generalized): Secondary | ICD-10-CM | POA: Diagnosis not present

## 2015-04-16 DIAGNOSIS — R29898 Other symptoms and signs involving the musculoskeletal system: Secondary | ICD-10-CM | POA: Diagnosis not present

## 2015-04-16 DIAGNOSIS — M545 Low back pain, unspecified: Secondary | ICD-10-CM

## 2015-04-16 DIAGNOSIS — M25561 Pain in right knee: Secondary | ICD-10-CM | POA: Diagnosis not present

## 2015-04-16 NOTE — Therapy (Signed)
[facility] [address] [place], [place], [address] Phone: [phone]   Fax:  [phone]  Physical Therapy Treatment  Patient Details  Name: Jennifer BeetsVelma M Hao MRN: [id number] Date of Birth: 05-19-1922 Referring Provider: Dr. Syliva OvermanMargaret Simpson  Encounter Date: 04/16/2015      PT End of Session - 04/16/15 1622    Visit Number 5   Number of Visits 22   Date for PT Re-Evaluation 05/03/15   Authorization Type Medicare    Authorization Time Period 04/03/2015 to 05/22/2015   PT Start Time 1347   PT Stop Time 1426   PT Time Calculation (min) 39 min   Equipment Utilized During Treatment Gait belt   Activity Tolerance Patient tolerated treatment well;Patient limited by fatigue   Behavior [facility] for tasks assessed/performed      Past Medical History  Diagnosis Date  . Chest pain 2006  . COPD (chronic obstructive pulmonary disease) (HCC)   . Hyperlipidemia   . Osteoarthritis     status post left TKA; surgery on the right is anticipated in the near future   . Nicotine addiction   . Gastroesophageal reflux disease   . Hiatal hernia   . Anemia     iron deficiency post op.  . Chronic back pain   . Pulmonary nodules     stable since 2006  . Cellulitis of finger     right   . Shingles     right breast   . Ulcer     gential  . Skin infection   . HTN (hypertension) 04/22/2014  . CAD (coronary artery disease) 2008    Cath 40% mid LAD, 35% RCA  . Use of cane as ambulatory aid     Past Surgical History  Procedure Laterality Date  . Cholecystectomy    . Combined hysterectomy abdominal w/ a&p repair / oophorectomy  approx. 40 years ago   . Mastectomy  1998 & 2007     right -1998 / left 2007  . Total knee arthroplasty  4/08    left   . Right knee replacement  09/2009    Dr. Romeo AppleHarrison  . Abdominal hysterectomy    . Breast surgery N/A     bilateral  . Cardiac catheterization  2008    Mid LAD 40%, RCA 25%.     There were  no vitals filed for this visit.  Visit Diagnosis:  Physical deconditioning  Unsteadiness  Difficulty walking  Weakness of both legs  Knee pain, bilateral  Bilateral low back pain without sciatica      Subjective Assessment - 04/16/15 1616    Subjective Pt arrived using her RW and states she is doing good. Still c/o of LBP and knee pain that she says has been going on "forever"   Patient is accompained by: Family member   Pertinent History HTN, COPD, OA, osteoporosis   Patient Stated Goals Pt's goal is to improve her balance, strength, and reduce her pain.    Currently in Pain? Yes   Pain Score --  pt did not rate her pain this session   Pain Location Back   Pain Orientation Lower   Pain Descriptors / Indicators Aching   Pain Type Chronic pain   Pain Onset More than a month ago                         [facility] Treatment/Exercise - 04/16/15 0001  Ambulation/Gait   Ambulation/Gait Yes   Ambulation/Gait Assistance 4: Min guard   Ambulation Distance (Feet) 200 Feet   Assistive device Rolling walker   Gait Pattern Poor foot clearance - left;Poor foot clearance - right;Shuffle;Trunk flexed;Step-through pattern  improved foot clearance   Ambulation Surface Level   Gait Comments pt requiring 2 rest breaks during activity; SaO2 monitored and dropped to 88% but quickly rose to 91% during rest break with pursed lip breathing technique   Knee/Hip Exercises: Standing   Hip Flexion 1 set;5 reps  with cone taps wearing 2# ankle wt   Knee/Hip Exercises: Seated   Long Arc Quad Both;2 sets;10 reps   Long Arc Quad Weight 2 lbs.   Heel Slides Strengthening;Both;1 set;10 reps  red TB   Clamshell with TheraBand Green  2x5   Ankle Exercises: Standing   Heel Raises 10 reps   Toe Raise 10 reps                PT Education - 04/16/15 1432    Education provided Yes   Education Details Reviewed pursed lip breathing technique during gait training     Person(s) Educated Patient   Methods Explanation;Verbal cues;Demonstration   Comprehension Verbalized understanding;Need further instruction          PT Short Term Goals - 04/03/15 1827    PT SHORT TERM GOAL #1   Title Patient/family will independently verbalize and demo proper completion of her initial HEP to continue with LE strengthening at home.    Time 3   Period Weeks   Status New   PT SHORT TERM GOAL #2   Title Patient will independently verbalize 5/5 fall precautions in order to reduce the risk for falls with household ambulation.   Time 3   Period Weeks   Status New   PT SHORT TERM GOAL #3   Title Patient will be able to complete 5 sit to stands in <15 seconds in order to improve performance and ease with transfers.   Time 3   Period Weeks   Status New   PT SHORT TERM GOAL #4   Title Patient will be able to maintain Romberg with EO on level ground for >30 seconds in order to reduce the risk for falls with dressing ADLs.    Time 4   Period Weeks   Status New           PT Long Term Goals - 04/03/15 1828    PT LONG TERM GOAL #1   Title Patient/family will independently verbalize and demo proper completion of her advanced HEP in order to continue with LE strengthening at home once DC from PT.    Time 7   Period Weeks   Status New   PT LONG TERM GOAL #2   Title Patient will be able to independently ambulate >350' on level terrain with a FWW with improved heel to toe sequence and cadence in order to progress towards improved community ambulation.   Time 7   Period Weeks   Status New   PT LONG TERM GOAL #3   Title Patient will present with improved B LE strength to >4-/5 MMT grade in order to improve performance with sit<>stand transfers.    Time 7   Period Weeks   Status New   PT LONG TERM GOAL #4   Title TUG time will improve to <20 seconds with the use of an AD in order to reduce the risk for falls with household ambulation.  Time 7   Period Weeks    Status New               Plan - 04/16/15 1623    Clinical Impression Statement Pt arrived using her RW this session and was able to make it to the treatment table without increased SOB. She continues to tolerate increased reps and weight with LE strengthening activity without too much difficulty. Pt reporting no increase in pain by the end of today's session. Will continue current POC.   Pt will benefit from skilled therapeutic intervention in order to improve on the following deficits Decreased strength;Abnormal gait;Postural dysfunction;Difficulty walking;Decreased activity tolerance;Decreased mobility;Decreased balance;Decreased safety awareness;Pain;Decreased endurance   Rehab Potential Fair   PT Duration --  7 weeks   PT Treatment/Interventions ADLs/Self Care Home Management;Cryotherapy;Moist Heat;DME Instruction;Gait training;Stair training;Functional mobility training;Therapeutic activities;Therapeutic exercise;Balance training;Neuromuscular re-education;Patient/family education;Passive range of motion;Manual techniques   PT Next Visit Plan Balance training next session; continue to progress hip/knee strengthening ther ex, pt education for pursed lip breathing   PT Home Exercise Plan no updates this session   Consulted and Agree with Plan of Care Patient        Problem List Patient Active Problem List   Diagnosis Date Noted  . Special screening for malignant neoplasms, colon 09/30/2014  . DNR no code (do not resuscitate) 04/25/2014  . HTN (hypertension) 04/22/2014  . Urinary incontinence 04/04/2013  . Osteoporosis, post-menopausal 12/06/2012  . At high risk for falls 07/30/2012  . MCI (mild cognitive impairment) 08/19/2011  . COPD (chronic obstructive pulmonary disease) (HCC) 06/25/2011  . CEREBROVASCULAR DISEASE 08/05/2009  . SPINAL STENOSIS 06/09/2007  . TOBACCO ABUSE 01/25/2007  . Coronary atherosclerosis 01/25/2007  . HIATAL HERNIA WITH REFLUX 01/25/2007  .  Osteoarthritis 01/25/2007   4:32 PM,04/16/2015 Marylyn Ishihara PT, DPT [facility] [phone]  [facility] [address] [place], [place], [address] Phone: [phone]   Fax:  [phone]  Name: FAREEDAH MAHLER MRN: [id number] Date of Birth: 10/26/1922

## 2015-04-18 ENCOUNTER — Ambulatory Visit (HOSPITAL_COMMUNITY): Payer: Medicare Other | Admitting: Physical Therapy

## 2015-04-18 DIAGNOSIS — M6281 Muscle weakness (generalized): Secondary | ICD-10-CM

## 2015-04-18 DIAGNOSIS — M25562 Pain in left knee: Secondary | ICD-10-CM

## 2015-04-18 DIAGNOSIS — M545 Low back pain, unspecified: Secondary | ICD-10-CM

## 2015-04-18 DIAGNOSIS — R262 Difficulty in walking, not elsewhere classified: Secondary | ICD-10-CM | POA: Diagnosis not present

## 2015-04-18 DIAGNOSIS — R2681 Unsteadiness on feet: Secondary | ICD-10-CM | POA: Diagnosis not present

## 2015-04-18 DIAGNOSIS — M25561 Pain in right knee: Secondary | ICD-10-CM | POA: Diagnosis not present

## 2015-04-18 DIAGNOSIS — R29898 Other symptoms and signs involving the musculoskeletal system: Secondary | ICD-10-CM

## 2015-04-18 DIAGNOSIS — R5381 Other malaise: Secondary | ICD-10-CM | POA: Diagnosis not present

## 2015-04-18 NOTE — Therapy (Signed)
[facility] [address] [place], [place], [address] Phone: [phone]   Fax:  [phone]  Physical Therapy Treatment  Patient Details  Name: Juanetta BeetsVelma M Lackie MRN: [id number] Date of Birth: 07-25-1922 Referring Provider: Dr. Syliva OvermanMargaret Simpson  Encounter Date: 04/18/2015      PT End of Session - 04/18/15 1344    Visit Number 6   Number of Visits 22   Date for PT Re-Evaluation 05/03/15   Authorization Type Medicare    Authorization Time Period 04/03/2015 to 05/22/2015   PT Start Time 1300   PT Stop Time 1340   PT Time Calculation (min) 40 min   Equipment Utilized During Treatment Gait belt   Activity Tolerance Patient tolerated treatment well;Patient limited by fatigue   Behavior During Therapy [facility] for tasks assessed/performed      Past Medical History  Diagnosis Date  . Chest pain 2006  . COPD (chronic obstructive pulmonary disease) (HCC)   . Hyperlipidemia   . Osteoarthritis     status post left TKA; surgery on the right is anticipated in the near future   . Nicotine addiction   . Gastroesophageal reflux disease   . Hiatal hernia   . Anemia     iron deficiency post op.  . Chronic back pain   . Pulmonary nodules     stable since 2006  . Cellulitis of finger     right   . Shingles     right breast   . Ulcer     gential  . Skin infection   . HTN (hypertension) 04/22/2014  . CAD (coronary artery disease) 2008    Cath 40% mid LAD, 35% RCA  . Use of cane as ambulatory aid     Past Surgical History  Procedure Laterality Date  . Cholecystectomy    . Combined hysterectomy abdominal w/ a&p repair / oophorectomy  approx. 40 years ago   . Mastectomy  1998 & 2007     right -1998 / left 2007  . Total knee arthroplasty  4/08    left   . Right knee replacement  09/2009    Dr. Romeo AppleHarrison  . Abdominal hysterectomy    . Breast surgery N/A     bilateral  . Cardiac catheterization  2008    Mid LAD 40%, RCA 25%.     There were  no vitals filed for this visit.  Visit Diagnosis:  Muscle weakness (generalized) - Plan: PT plan of care cert/re-cert  Unsteadiness on feet - Plan: PT plan of care cert/re-cert  Difficulty in walking, not elsewhere classified - Plan: PT plan of care cert/re-cert  Other symptoms and signs involving the musculoskeletal system - Plan: PT plan of care cert/re-cert  Pain in right knee - Plan: PT plan of care cert/re-cert  Bilateral low back pain without sciatica - Plan: PT plan of care cert/re-cert  Pain in left knee - Plan: PT plan of care cert/re-cert      Subjective Assessment - 04/18/15 1304    Subjective Pt arrives using her [facility] stating she is "doing just fine with this". Continues to complain of LBP and pain in B knees. They aren't too bad though.   Patient is accompained by: Family member   Pertinent History HTN, COPD, OA, osteoporosis   Patient Stated Goals Pt's goal is to improve her balance, strength, and reduce her pain.    Currently in Pain? Yes   Pain Score --  unable    Pain  Location Back  B knees   Pain Orientation Lower;Mid   Pain Descriptors / Indicators Aching   Pain Type Chronic pain   Pain Onset More than a month ago   Pain Frequency Intermittent   Aggravating Factors  unsure   Pain Relieving Factors rest                         [facility] Adult PT Treatment/Exercise - 04/18/15 0001    Knee/Hip Exercises: Stretches   Passive Hamstring Stretch Both;2 reps;30 seconds  L>R tightness   Knee/Hip Exercises: Seated   Long Arc Quad Both;2 sets;10 reps   Long Arc Quad Weight 3 lbs.   Heel Slides Strengthening;Both;1 set;10 reps  green TB   Clamshell with TheraBand Green  2x10   Ankle Exercises: Standing   Heel Raises 15 reps   Toe Raise 15 reps             Balance Exercises - 04/18/15 1321    Balance Exercises: Standing   Tandem Stance 2 reps;Eyes open;Intermittent upper extremity support;20 secs  increased difficulty RLE forward,  20  sec LLE, 10 sec RLE   Rockerboard EO;UE support;Anterior/posterior;Lateral;10 seconds  activity terminated d/t pt's significant increase in LBP           PT Education - 04/18/15 1342    Education provided Yes   Education Details reinforced importance of using RW for improved balance, mobility and energy conservation   Person(s) Educated Patient   Methods Explanation   Comprehension Verbalized understanding          PT Short Term Goals - 04/03/15 1827    PT SHORT TERM GOAL #1   Title Patient/family will independently verbalize and demo proper completion of her initial HEP to continue with LE strengthening at home.    Time 3   Period Weeks   Status New   PT SHORT TERM GOAL #2   Title Patient will independently verbalize 5/5 fall precautions in order to reduce the risk for falls with household ambulation.   Time 3   Period Weeks   Status New   PT SHORT TERM GOAL #3   Title Patient will be able to complete 5 sit to stands in <15 seconds in order to improve performance and ease with transfers.   Time 3   Period Weeks   Status New   PT SHORT TERM GOAL #4   Title Patient will be able to maintain Romberg with EO on level ground for >30 seconds in order to reduce the risk for falls with dressing ADLs.    Time 4   Period Weeks   Status New           PT Long Term Goals - 04/03/15 1828    PT LONG TERM GOAL #1   Title Patient/family will independently verbalize and demo proper completion of her advanced HEP in order to continue with LE strengthening at home once DC from PT.    Time 7   Period Weeks   Status New   PT LONG TERM GOAL #2   Title Patient will be able to independently ambulate >350' on level terrain with a FWW with improved heel to toe sequence and cadence in order to progress towards improved community ambulation.   Time 7   Period Weeks   Status New   PT LONG TERM GOAL #3   Title Patient will present with improved B LE strength to >4-/5 MMT grade in order  to  improve performance with sit<>stand transfers.    Time 7   Period Weeks   Status New   PT LONG TERM GOAL #4   Title TUG time will improve to <20 seconds with the use of an AD in order to reduce the risk for falls with household ambulation.    Time 7   Period Weeks   Status New               Plan - 04/18/15 1344    Clinical Impression Statement Pt arrived using her SPC this session with therapist reinforcing the importance of using RW for improved energy conservation and and safety with mobility. Therapist will continue to address this as needed. Rest of session focused on LE strengthening and balance activity with pt demonstrating increased diffculty with since stance activity. Will continue current POC.    Pt will benefit from skilled therapeutic intervention in order to improve on the following deficits Decreased strength;Abnormal gait;Postural dysfunction;Difficulty walking;Decreased activity tolerance;Decreased mobility;Decreased balance;Decreased safety awareness;Pain;Decreased endurance   Rehab Potential Fair   PT Duration --  7 weeks   PT Treatment/Interventions ADLs/Self Care Home Management;Cryotherapy;Moist Heat;DME Instruction;Gait training;Stair training;Functional mobility training;Therapeutic activities;Therapeutic exercise;Balance training;Neuromuscular re-education;Patient/family education;Passive range of motion;Manual techniques   PT Next Visit Plan Balance training next session; continue to progress hip/knee strengthening ther ex, pt education for pursed lip breathing   PT Home Exercise Plan no updates this session   Consulted and Agree with Plan of Care Patient        Problem List Patient Active Problem List   Diagnosis Date Noted  . Special screening for malignant neoplasms, colon 09/30/2014  . DNR no code (do not resuscitate) 04/25/2014  . HTN (hypertension) 04/22/2014  . Urinary incontinence 04/04/2013  . Osteoporosis, post-menopausal 12/06/2012  . At  high risk for falls 07/30/2012  . MCI (mild cognitive impairment) 08/19/2011  . COPD (chronic obstructive pulmonary disease) (HCC) 06/25/2011  . CEREBROVASCULAR DISEASE 08/05/2009  . SPINAL STENOSIS 06/09/2007  . TOBACCO ABUSE 01/25/2007  . Coronary atherosclerosis 01/25/2007  . HIATAL HERNIA WITH REFLUX 01/25/2007  . Osteoarthritis 01/25/2007    2:03 PM,04/18/2015 Marylyn Ishihara PT, DPT [facility] [phone]  [facility] [address] [place], [place], [address] Phone: [phone]   Fax:  [phone]  Name: TAWANDA SCHALL MRN: [id number] Date of Birth: November 19, 1922

## 2015-04-22 ENCOUNTER — Ambulatory Visit (HOSPITAL_COMMUNITY): Payer: Medicare Other

## 2015-04-22 VITALS — BP 132/80 | HR 76

## 2015-04-22 DIAGNOSIS — M25561 Pain in right knee: Secondary | ICD-10-CM | POA: Diagnosis not present

## 2015-04-22 DIAGNOSIS — R262 Difficulty in walking, not elsewhere classified: Secondary | ICD-10-CM

## 2015-04-22 DIAGNOSIS — M25562 Pain in left knee: Secondary | ICD-10-CM | POA: Diagnosis not present

## 2015-04-22 DIAGNOSIS — R5381 Other malaise: Secondary | ICD-10-CM | POA: Diagnosis not present

## 2015-04-22 DIAGNOSIS — M6281 Muscle weakness (generalized): Secondary | ICD-10-CM

## 2015-04-22 DIAGNOSIS — M545 Low back pain, unspecified: Secondary | ICD-10-CM

## 2015-04-22 DIAGNOSIS — R2681 Unsteadiness on feet: Secondary | ICD-10-CM

## 2015-04-22 DIAGNOSIS — R29898 Other symptoms and signs involving the musculoskeletal system: Secondary | ICD-10-CM

## 2015-04-22 NOTE — Therapy (Signed)
[facility] [address] [place], [place], [address] Phone: [phone]   Fax:  [phone]  Physical Therapy Treatment  Patient Details  Name: Jennifer [name] MRN: [id number] Date of Birth: 1922-12-27 Referring Provider: Dr. Syliva OvermanMargaret [name]  Encounter Date: 04/22/2015      PT End of Session - 04/22/15 1039    Visit Number 7   Number of Visits 22   Date for PT Re-Evaluation 05/03/15   Authorization Type Medicare    Authorization Time Period 04/03/2015 to 05/22/2015   PT Start Time 1031   PT Stop Time 1113   PT Time Calculation (min) 42 min   Equipment Utilized During Treatment Gait belt   Activity Tolerance Patient tolerated treatment well;Patient limited by fatigue   Behavior During Therapy [facility] for tasks assessed/performed      Past Medical History  Diagnosis Date  . Chest pain 2006  . COPD (chronic obstructive pulmonary disease) (HCC)   . Hyperlipidemia   . Osteoarthritis     status post left TKA; surgery on the right is anticipated in the near future   . Nicotine addiction   . Gastroesophageal reflux disease   . Hiatal hernia   . Anemia     iron deficiency post op.  . Chronic back pain   . Pulmonary nodules     stable since 2006  . Cellulitis of finger     right   . Shingles     right breast   . Ulcer     gential  . Skin infection   . HTN (hypertension) 04/22/2014  . CAD (coronary artery disease) 2008    Cath 40% mid LAD, 35% RCA  . Use of cane as ambulatory aid     Past Surgical History  Procedure Laterality Date  . Cholecystectomy    . Combined hysterectomy abdominal w/ a&p repair / oophorectomy  approx. 40 years ago   . Mastectomy  1998 & 2007     right -1998 / left 2007  . Total knee arthroplasty  4/08    left   . Right knee replacement  09/2009    Dr. Romeo AppleHarrison  . Abdominal hysterectomy    . Breast surgery N/A     bilateral  . Cardiac catheterization  2008    Mid LAD 40%, RCA 25%.     Filed  Vitals:   04/22/15 1035  BP: 132/80 mmhg, at rest  Pulse: 76 bpm, at rest  SpO2: 95% at rest         Subjective Assessment - 04/22/15 1035    Subjective Pt c/o LBP and B knee pain upon arrival. Pt reprots that standing has been "a little easier" since starting with PT.    Patient is accompained by: Family member   Pertinent History HTN, COPD, OA, osteoporosis   How long can you sit comfortably? Unlimited    Patient Stated Goals Pt's goal is to improve her balance, strength, and reduce her pain.    Currently in Pain? Yes   Pain Score 8    Pain Location Back   Pain Orientation Lower;Mid   Pain Descriptors / Indicators Aching   Pain Type Chronic pain   Pain Onset More than a month ago   Pain Frequency Constant   Aggravating Factors  WB activities    Pain Relieving Factors sitting/rest    Effect of Pain on Daily Activities difficulty with long distance ambulation    Multiple Pain Sites Yes   Pain  Score 8   Pain Location Knee   Pain Orientation Right;Left;Anterior   Pain Descriptors / Indicators Aching   Pain Type Chronic pain   Pain Onset More than a month ago   Pain Frequency Intermittent   Aggravating Factors  difficulty with long distance ambulation    Pain Relieving Factors rest, pain meds, and sitting    Effect of Pain on Daily Activities difficulty with long distance ambulation                          [facility] Adult PT Treatment/Exercise - 04/22/15 0001    Knee/Hip Exercises: Seated   Long Arc Quad Both;2 sets;10 reps   Long Arc Quad Weight 3 lbs.   Ball Squeeze 2x10   Clamshell with TheraBand Green  2x10   Marching 2 sets;20 reps;Weights   Marching Limitations 2#; cuing required to perform through full range.    Sit to Sand 3 sets;5 reps;with UE support             Balance Exercises - 04/22/15 1105    Balance Exercises: Standing   Standing Eyes Opened Foam/compliant surface;2 reps;Wide (BOA);Solid surface   Marching Limitations 10 reps  with B UE support on FWW    Other Standing Exercises weight shifting A<>P and M <>L on solid ground x 3 sets each with UE support   Overall Comments Other (comment)  all balance activities were completed with close supervision           PT Education - 04/22/15 1039    Education provided Yes   Education Details 5/5 fall precautions and pursed lip breathing    Person(s) Educated Patient   Methods Explanation   Comprehension Verbalized understanding          PT Short Term Goals - 04/03/15 1827    PT SHORT TERM GOAL #1   Title Patient/family will independently verbalize and demo proper completion of her initial HEP to continue with LE strengthening at home.    Time 3   Period Weeks   Status New   PT SHORT TERM GOAL #2   Title Patient will independently verbalize 5/5 fall precautions in order to reduce the risk for falls with household ambulation.   Time 3   Period Weeks   Status New   PT SHORT TERM GOAL #3   Title Patient will be able to complete 5 sit to stands in <15 seconds in order to improve performance and ease with transfers.   Time 3   Period Weeks   Status New   PT SHORT TERM GOAL #4   Title Patient will be able to maintain Romberg with EO on level ground for >30 seconds in order to reduce the risk for falls with dressing ADLs.    Time 4   Period Weeks   Status New           PT Long Term Goals - 04/03/15 1828    PT LONG TERM GOAL #1   Title Patient/family will independently verbalize and demo proper completion of her advanced HEP in order to continue with LE strengthening at home once DC from PT.    Time 7   Period Weeks   Status New   PT LONG TERM GOAL #2   Title Patient will be able to independently ambulate >350' on level terrain with a FWW with improved heel to toe sequence and cadence in order to progress towards improved community ambulation.  Time 7   Period Weeks   Status New   PT LONG TERM GOAL #3   Title Patient will present with improved B  LE strength to >4-/5 MMT grade in order to improve performance with sit<>stand transfers.    Time 7   Period Weeks   Status New   PT LONG TERM GOAL #4   Title TUG time will improve to <20 seconds with the use of an AD in order to reduce the risk for falls with household ambulation.    Time 7   Period Weeks   Status New               Plan - 04/22/15 1040    Clinical Impression Statement PT tx was focused on B hip/knee strengthening ther ex and standing balance training. Pt required frequent breaks t/o today's PT tx secondary to B LE fatigue and DOE. Verbal and visual cues required for proper technique with resisted ther ex and to complete each ther ex within available ROM. Instructed pt on pursed lip breathing with fair technique demo. SPO2 levels remained above 94% and HR remained between 76-82 bpm t/o today's tx. Fair tolerance reported with today's PT visit, which was limited at times due to limited endurance. Pt continues to present with B LE weakness, impaired endurance, difficulty with walking, and unsteady balance. Pt would benefit from skilled PT to address current deficits in order to improve her functional mobility, endurance, and reduce the risk for falls. Continue with current POC.    Rehab Potential Fair   PT Frequency 3x / week   PT Duration 4 weeks   PT Treatment/Interventions ADLs/Self Care Home Management;Cryotherapy;Moist Heat;DME Instruction;Gait training;Stair training;Functional mobility training;Therapeutic activities;Therapeutic exercise;Balance training;Neuromuscular re-education;Patient/family education;Passive range of motion;Manual techniques   PT Next Visit Plan Balance training next session; continue to progress hip/knee strengthening ther ex, pt education for pursed lip breathing   PT Home Exercise Plan no updates this session   Consulted and Agree with Plan of Care Patient      Patient will benefit from skilled therapeutic intervention in order to improve  the following deficits and impairments:  Decreased strength, Abnormal gait, Postural dysfunction, Difficulty walking, Decreased activity tolerance, Decreased mobility, Decreased balance, Decreased safety awareness, Pain, Decreased endurance  Visit Diagnosis: Muscle weakness (generalized)  Unsteadiness on feet  Difficulty in walking, not elsewhere classified  Other symptoms and signs involving the musculoskeletal system  Bilateral low back pain without sciatica  Pain in left knee  Pain in right knee     Problem List Patient Active Problem List   Diagnosis Date Noted  . Special screening for malignant neoplasms, colon 09/30/2014  . DNR no code (do not resuscitate) 04/25/2014  . HTN (hypertension) 04/22/2014  . Urinary incontinence 04/04/2013  . Osteoporosis, post-menopausal 12/06/2012  . At high risk for falls 07/30/2012  . MCI (mild cognitive impairment) 08/19/2011  . COPD (chronic obstructive pulmonary disease) (HCC) 06/25/2011  . CEREBROVASCULAR DISEASE 08/05/2009  . SPINAL STENOSIS 06/09/2007  . TOBACCO ABUSE 01/25/2007  . Coronary atherosclerosis 01/25/2007  . HIATAL HERNIA WITH REFLUX 01/25/2007  . Osteoarthritis 01/25/2007    Bonnee Quin, PT, DPT  04/22/2015, 11:16 AM  [facility] [address] [place], [place], [address] Phone: [phone]   Fax:  [phone]  Name: Jennifer [name] MRN: [id number] Date of Birth: 05-18-1922

## 2015-04-23 ENCOUNTER — Encounter: Payer: Self-pay | Admitting: Family Medicine

## 2015-04-23 DIAGNOSIS — Z23 Encounter for immunization: Secondary | ICD-10-CM | POA: Insufficient documentation

## 2015-04-23 NOTE — Assessment & Plan Note (Signed)
Unsteady gait due to severe arthritis, will benefit from out patient Pt, referred for 6 weeks session Advised to use cane at all times and ensure proper lighting

## 2015-04-23 NOTE — Assessment & Plan Note (Signed)
Continue low dose aricept, needs re testing however, doubt that daughter is keen on this as she has resisted the standard treating dose of 10 mg daily over the years

## 2015-04-23 NOTE — Assessment & Plan Note (Signed)
After obtaining informed consent, the vaccine is  administered by LPN.  

## 2015-04-23 NOTE — Assessment & Plan Note (Signed)
Longstanding nicotine use is underlying insult, probable recent discontinuation of cigarettes, uncertain

## 2015-04-24 ENCOUNTER — Ambulatory Visit (HOSPITAL_COMMUNITY): Payer: Medicare Other

## 2015-04-24 ENCOUNTER — Encounter (HOSPITAL_COMMUNITY): Payer: Self-pay

## 2015-04-24 VITALS — BP 108/72 | HR 78

## 2015-04-24 DIAGNOSIS — R2681 Unsteadiness on feet: Secondary | ICD-10-CM | POA: Diagnosis not present

## 2015-04-24 DIAGNOSIS — R29898 Other symptoms and signs involving the musculoskeletal system: Secondary | ICD-10-CM | POA: Diagnosis not present

## 2015-04-24 DIAGNOSIS — M545 Low back pain, unspecified: Secondary | ICD-10-CM

## 2015-04-24 DIAGNOSIS — M6281 Muscle weakness (generalized): Secondary | ICD-10-CM

## 2015-04-24 DIAGNOSIS — R262 Difficulty in walking, not elsewhere classified: Secondary | ICD-10-CM | POA: Diagnosis not present

## 2015-04-24 DIAGNOSIS — R5381 Other malaise: Secondary | ICD-10-CM | POA: Diagnosis not present

## 2015-04-24 DIAGNOSIS — M25562 Pain in left knee: Secondary | ICD-10-CM | POA: Diagnosis not present

## 2015-04-24 DIAGNOSIS — M25561 Pain in right knee: Secondary | ICD-10-CM | POA: Diagnosis not present

## 2015-04-24 NOTE — Therapy (Signed)
[place] [facility] [address] [place], [place], [address] Phone: [phone]   Fax:  [phone]  Physical Therapy Treatment  Patient Details  Name: Jennifer [name] MRN: [id number] Date of Birth: [birth year] Referring Provider: Dr. Syliva Overman  Encounter Date: 04/24/2015      PT End of Session - 04/24/15 1045    Visit Number 8   Number of Visits 22   Date for PT Re-Evaluation 05/03/15   Authorization Type Medicare    Authorization Time Period 04/03/2015 to 05/22/2015   PT Start Time 1033   PT Stop Time 1115   PT Time Calculation (min) 42 min   Activity Tolerance Patient tolerated treatment well;Patient limited by fatigue   Behavior During Therapy [facility] for tasks assessed/performed      Past Medical History  Diagnosis Date  . Chest pain 2006  . COPD (chronic obstructive pulmonary disease) (HCC)   . Hyperlipidemia   . Osteoarthritis     status post left TKA; surgery on the right is anticipated in the near future   . Nicotine addiction   . Gastroesophageal reflux disease   . Hiatal hernia   . Anemia     iron deficiency post op.  . Chronic back pain   . Pulmonary nodules     stable since 2006  . Cellulitis of finger     right   . Shingles     right breast   . Ulcer     gential  . Skin infection   . HTN (hypertension) 04/22/2014  . CAD (coronary artery disease) 2008    Cath 40% mid LAD, 35% RCA  . Use of cane as ambulatory aid     Past Surgical History  Procedure Laterality Date  . Cholecystectomy    . Combined hysterectomy abdominal w/ a&p repair / oophorectomy  approx. 40 years ago   . Mastectomy  1998 & 2007     right -1998 / left 2007  . Total knee arthroplasty  4/08    left   . Right knee replacement  09/2009    Dr. Romeo Apple  . Abdominal hysterectomy    . Breast surgery N/A     bilateral  . Cardiac catheterization  2008    Mid LAD 40%, RCA 25%.     Filed Vitals:   04/24/15 1041  BP: 108/72  Pulse: 78   SpO2: 96%        Subjective Assessment - 04/24/15 1038    Subjective Pt c/o LBP and B knee pain upon arrival, but was unable to provide a rating. She noted "my knees and back are hurting bad today".    Patient is accompained by: Family member   Pertinent History HTN, COPD, OA, osteoporosis   Limitations Walking;Standing   How long can you sit comfortably? Unlimited    How long can you stand comfortably? Unknown    How long can you walk comfortably? Unknown    Patient Stated Goals Pt's goal is to improve her balance, strength, and reduce her pain.    Currently in Pain? Yes   Pain Score --  Unknown, patient unable to rate pain    Pain Location Back   Pain Orientation Lower;Mid   Pain Descriptors / Indicators Aching   Pain Type Chronic pain   Pain Onset More than a month ago   Pain Frequency Constant   Aggravating Factors  WB activities    Pain Relieving Factors sitting/rest    Effect of Pain  on Daily Activities difficulty with short distance ambulation and prolonged standing with ADLs    Multiple Pain Sites Yes   Pain Score --  Unknown, patient unable to provide pain rating    Pain Location Knee   Pain Orientation Right;Left;Anterior   Pain Descriptors / Indicators Aching   Pain Type Chronic pain   Pain Onset More than a month ago   Pain Frequency Constant   Aggravating Factors  WB activities    Pain Relieving Factors sitting/rest    Effect of Pain on Daily Activities difficulty with short distance ambulation and prolonged standing with ADLs                          [facility] Adult PT Treatment/Exercise - 04/24/15 0001    Exercises   Exercises Lumbar   Lumbar Exercises: Standing   Other Standing Lumbar Exercises Static standing x 4 sets of 1-2 minutes in order to improve standing tolerance and balance  with B UE support on FWW with progression to unilateral UE    Lumbar Exercises: Seated   Other Seated Lumbar Exercises Seated lumbar flexion with physioball  roll x 1 set of 15 reps with manual assist provided to ensure proper technique    Other Seated Lumbar Exercises Seated rows with red thera-band x 1 set of 10 reps;   Scap retractions without resistance x 1 set of 10 reps with 2 sec hold    Knee/Hip Exercises: Seated   Long Arc Quad Both;2 sets;10 reps   Long Arc Quad Weight 3 lbs.   Ball Squeeze 2x10   Clamshell with TheraBand Green  2x10   Marching 2 sets;20 reps;Weights   Marching Limitations 2#; cuing required to perform through full range.                 PT Education - 04/24/15 1044    Education provided Yes   Education Details 5/5 fall precautions, pursed lip breathing, optimal sitting/standing posture, and heel to toe sequence   Person(s) Educated Patient   Methods Explanation;Demonstration   Comprehension Verbalized understanding;Need further instruction;Returned demonstration;Verbal cues required          PT Short Term Goals - 04/03/15 1827    PT SHORT TERM GOAL #1   Title Patient/family will independently verbalize and demo proper completion of her initial HEP to continue with LE strengthening at home.    Time 3   Period Weeks   Status New   PT SHORT TERM GOAL #2   Title Patient will independently verbalize 5/5 fall precautions in order to reduce the risk for falls with household ambulation.   Time 3   Period Weeks   Status New   PT SHORT TERM GOAL #3   Title Patient will be able to complete 5 sit to stands in <15 seconds in order to improve performance and ease with transfers.   Time 3   Period Weeks   Status New   PT SHORT TERM GOAL #4   Title Patient will be able to maintain Romberg with EO on level ground for >30 seconds in order to reduce the risk for falls with dressing ADLs.    Time 4   Period Weeks   Status New           PT Long Term Goals - 04/03/15 1828    PT LONG TERM GOAL #1   Title Patient/family will independently verbalize and demo proper completion of her advanced HEP in  order  to continue with LE strengthening at home once DC from PT.    Time 7   Period Weeks   Status New   PT LONG TERM GOAL #2   Title Patient will be able to independently ambulate >350' on level terrain with a FWW with improved heel to toe sequence and cadence in order to progress towards improved community ambulation.   Time 7   Period Weeks   Status New   PT LONG TERM GOAL #3   Title Patient will present with improved B LE strength to >4-/5 MMT grade in order to improve performance with sit<>stand transfers.    Time 7   Period Weeks   Status New   PT LONG TERM GOAL #4   Title TUG time will improve to <20 seconds with the use of an AD in order to reduce the risk for falls with household ambulation.    Time 7   Period Weeks   Status New               Plan - 04/24/15 1045    Clinical Impression Statement PT tx was focused on B hip/knee strengthening ther ex, postural re-education, and standing balance training. Pt required frequent breaks t/o today's PT tx secondary to B LE fatigue and DOE. Instructed pt on pursed lip breathing with fair technique demo. SPO2 levels ranged between 93%-97% and HR remained between 74-86 bpm t/o today's tx. Patient was limited with standing therapeutic activities due to limited endurance and LBP. Patient was able to tolerate 1-2 minutes of standing at a time with UE support on FWW with close supervision. Added seated resisted rows with red thera-band and seated scap retractions without resistance in order to improve postural endurance/strength with standing ADLs and ambulation. Patient had difficulty with scap retractions with max cues required for proper technique. Further added seated lumbar flexion physioball roll to reduce LBP. Good tolerance reported with added ther ex. Patient continues to present with B LE weakness, impaired endurance, difficulty with walking, and unsteady balance. Pt would benefit from skilled PT to address current deficits in order to  improve her functional mobility, endurance, and reduce the risk for falls. Continue with current POC.     Rehab Potential Fair   PT Frequency 3x / week   PT Duration 4 weeks   PT Treatment/Interventions ADLs/Self Care Home Management;Cryotherapy;Moist Heat;DME Instruction;Gait training;Stair training;Functional mobility training;Therapeutic activities;Therapeutic exercise;Balance training;Neuromuscular re-education;Patient/family education;Passive range of motion;Manual techniques   PT Next Visit Plan Balance training next session; continue to progress hip/knee strengthening ther ex, pt education for pursed lip breathing   PT Home Exercise Plan no updates this session   Consulted and Agree with Plan of Care Patient      Patient will benefit from skilled therapeutic intervention in order to improve the following deficits and impairments:  Decreased strength, Abnormal gait, Postural dysfunction, Difficulty walking, Decreased activity tolerance, Decreased mobility, Decreased balance, Decreased safety awareness, Pain, Decreased endurance  Visit Diagnosis: Muscle weakness (generalized)  Unsteadiness on feet  Difficulty in walking, not elsewhere classified  Other symptoms and signs involving the musculoskeletal system  Bilateral low back pain without sciatica  Pain in left knee  Pain in right knee     Problem List Patient Active Problem List   Diagnosis Date Noted  . Need for vaccination with 13-polyvalent pneumococcal conjugate vaccine 04/23/2015  . DNR no code (do not resuscitate) 04/25/2014  . Urinary incontinence 04/04/2013  . Osteoporosis, post-menopausal 12/06/2012  . At high  risk for falls 07/30/2012  . MCI (mild cognitive impairment) 08/19/2011  . COPD (chronic obstructive pulmonary disease) (HCC) 06/25/2011  . CEREBROVASCULAR DISEASE 08/05/2009  . SPINAL STENOSIS 06/09/2007  . Coronary atherosclerosis 01/25/2007  . HIATAL HERNIA WITH REFLUX 01/25/2007  . Osteoarthritis  01/25/2007    Bonnee Quin, PT, DPT  04/24/2015, 11:17 AM  [facility] [address] [place], [place], [address] Phone: [phone]   Fax:  [phone]  Name: Jennifer [name] MRN: [id number] Date of Birth: 1922/09/06

## 2015-04-26 ENCOUNTER — Other Ambulatory Visit: Payer: Self-pay | Admitting: Family Medicine

## 2015-04-26 ENCOUNTER — Ambulatory Visit (HOSPITAL_COMMUNITY): Payer: Medicare Other | Admitting: Physical Therapy

## 2015-04-26 VITALS — BP 122/72 | HR 74

## 2015-04-26 DIAGNOSIS — R262 Difficulty in walking, not elsewhere classified: Secondary | ICD-10-CM

## 2015-04-26 DIAGNOSIS — R2681 Unsteadiness on feet: Secondary | ICD-10-CM

## 2015-04-26 DIAGNOSIS — M545 Low back pain, unspecified: Secondary | ICD-10-CM

## 2015-04-26 DIAGNOSIS — M25561 Pain in right knee: Secondary | ICD-10-CM

## 2015-04-26 DIAGNOSIS — R5381 Other malaise: Secondary | ICD-10-CM | POA: Diagnosis not present

## 2015-04-26 DIAGNOSIS — R29898 Other symptoms and signs involving the musculoskeletal system: Secondary | ICD-10-CM | POA: Diagnosis not present

## 2015-04-26 DIAGNOSIS — M6281 Muscle weakness (generalized): Secondary | ICD-10-CM

## 2015-04-26 DIAGNOSIS — M25562 Pain in left knee: Secondary | ICD-10-CM | POA: Diagnosis not present

## 2015-04-26 NOTE — Therapy (Addendum)
[place], [place], [address] Phone: [phone]   Fax:  [phone]  Physical Therapy Treatment/Discharge  Patient Details  Name: Jennifer [name] MRN: [id number] Date of Birth: [birth year] Referring Provider: Dr. Tula Nakayama  Encounter Date: 04/26/2015      PT End of Session - 04/26/15 1428    Visit Number 9  g-code next visit   Number of Visits 22   Date for PT Re-Evaluation 05/03/15   Authorization Type Medicare    Authorization Time Period 04/03/2015 to 05/22/2015   PT Start Time 1300   PT Stop Time 1345   PT Time Calculation (min) 45 min   Activity Tolerance Patient tolerated treatment well;Patient limited by fatigue   Behavior During Therapy [facility] for tasks assessed/performed      Past Medical History  Diagnosis Date  . Chest pain 2006  . COPD (chronic obstructive pulmonary disease) ([place])   . Hyperlipidemia   . Osteoarthritis     status post left TKA; surgery on the right is anticipated in the near future   . Nicotine addiction   . Gastroesophageal reflux disease   . Hiatal hernia   . Anemia     iron deficiency post op.  . Chronic back pain   . Pulmonary nodules     stable since 2006  . Cellulitis of finger     right   . Shingles     right breast   . Ulcer     gential  . Skin infection   . HTN (hypertension) 04/22/2014  . CAD (coronary artery disease) 2008    Cath 40% mid LAD, 35% RCA  . Use of cane as ambulatory aid     Past Surgical History  Procedure Laterality Date  . Cholecystectomy    . Combined hysterectomy abdominal w/ a&p repair / oophorectomy  approx. 40 years ago   . Mastectomy  1998 & 2007     right -1998 / left 2007  . Total knee arthroplasty  4/08    left   . Right knee replacement  09/2009    Dr. Aline Brochure  . Abdominal hysterectomy    . Breast surgery N/A     bilateral  . Cardiac catheterization  2008    Mid LAD 40%, RCA 25%.     Filed Vitals:   04/26/15  1303  BP: 122/72  Pulse: 74  SpO2: 93%        Subjective Assessment - 04/26/15 1303    Subjective Pt expresses that she has been hurting.  Pt states that she had a fall last week when she was coming back from the bathroom.  Pt states that her legs just got tired. Pt has been doing her walking from one end of the house to the other, and walk in the garage.    Limitations Walking;Standing   Patient Stated Goals Pt's goal is to improve her balance, strength, and reduce her pain.    Currently in Pain? Yes   Pain Score 10-Worst pain ever   Pain Location Back   Pain Orientation Lower;Mid   Pain Descriptors / Indicators Burning   Pain Type Chronic pain   Pain Score 10   Pain Location Knee   Pain Orientation Right;Left   Pain Descriptors / Indicators Burning   Pain Type Chronic pain   Pain Onset More than a month ago  [place] Adult PT Treatment/Exercise - 04/26/15 0001    Ambulation/Gait   Ambulation/Gait Yes   Ambulation/Gait Assistance 4: Min guard   Ambulation Distance (Feet) 40 Feet   Assistive device Rolling walker   Gait Pattern Poor foot clearance - left;Poor foot clearance - right;Shuffle;Trunk flexed;Step-through pattern;Right flexed knee in stance;Left flexed knee in stance;Antalgic  decreased B hip extenstion with vc's for posture.    Knee/Hip Exercises: Standing   Hip Flexion Stengthening;Both;10 reps;2 sets  Seated recovery period in between sets due to SOB   Knee/Hip Exercises: Seated   Long Arc Quad Both;2 sets;10 reps   Long Arc Quad Weight 3 lbs.             Balance Exercises - 04/26/15 1319    Balance Exercises: Standing   Tandem Stance Eyes open;10 secs  no UE support   Other Standing Exercises weight shifting A<>P, standing with feet together 30sec with 1 UE support,  vc's for posture   Overall Comments --  All balance activities completed with direct supervision           PT Education - 04/26/15 1427     Education provided Yes   Education Details reviewed fall precautions and pursed lip breathing technqiues; correct posture during ambulation and balance activity; discussed progress and HEP adherence with pt and caregiver   Person(s) Educated Patient   Methods Explanation;Demonstration   Comprehension Verbalized understanding;Need further instruction          PT Short Term Goals - 04/03/15 1827    PT SHORT TERM GOAL #1   Title Patient/family will independently verbalize and demo proper completion of her initial HEP to continue with LE strengthening at home.    Time 3   Period Weeks   Status New   PT SHORT TERM GOAL #2   Title Patient will independently verbalize 5/5 fall precautions in order to reduce the risk for falls with household ambulation.   Time 3   Period Weeks   Status New   PT SHORT TERM GOAL #3   Title Patient will be able to complete 5 sit to stands in <15 seconds in order to improve performance and ease with transfers.   Time 3   Period Weeks   Status New   PT SHORT TERM GOAL #4   Title Patient will be able to maintain Romberg with EO on level ground for >30 seconds in order to reduce the risk for falls with dressing ADLs.    Time 4   Period Weeks   Status New           PT Long Term Goals - 04/03/15 1828    PT LONG TERM GOAL #1   Title Patient/family will independently verbalize and demo proper completion of her advanced HEP in order to continue with LE strengthening at home once DC from PT.    Time 7   Period Weeks   Status New   PT LONG TERM GOAL #2   Title Patient will be able to independently ambulate >350' on level terrain with a FWW with improved heel to toe sequence and cadence in order to progress towards improved community ambulation.   Time 7   Period Weeks   Status New   PT LONG TERM GOAL #3   Title Patient will present with improved B LE strength to >4-/5 MMT grade in order to improve performance with sit<>stand transfers.    Time 7   Period  Weeks   Status New  PT LONG TERM GOAL #4   Title TUG time will improve to <20 seconds with the use of an AD in order to reduce the risk for falls with household ambulation.    Time 7   Period Weeks   Status New               Plan - 04/26/15 1429    Clinical Impression Statement Today's session was focused on some LE strengthening ther ex, postural education and gait/balance training for improved safety. Pt continued to require frequent rest breaks throughout her session secondary to fatigue and SOB. She demonstrates difficulty with sequencing of pursed lip breathing with SpO2 levels monitored during the session. Pt's caregiver expressed concern that she is not making progress and lacks motivation, but will continue to see if she can motivate her mother to perform HEP at home. Will continue current POC.    Rehab Potential Fair   PT Frequency 3x / week   PT Duration 4 weeks   PT Treatment/Interventions ADLs/Self Care Home Management;Cryotherapy;Moist Heat;DME Instruction;Gait training;Stair training;Functional mobility training;Therapeutic activities;Therapeutic exercise;Balance training;Neuromuscular re-education;Patient/family education;Passive range of motion;Manual techniques   PT Next Visit Plan Balance training next session; continue to progress hip/knee strengthening ther ex, pt education for pursed lip breathing   PT Home Exercise Plan no updates this session   Consulted and Agree with Plan of Care Patient      Patient will benefit from skilled therapeutic intervention in order to improve the following deficits and impairments:  Decreased strength, Abnormal gait, Postural dysfunction, Difficulty walking, Decreased activity tolerance, Decreased mobility, Decreased balance, Decreased safety awareness, Pain, Decreased endurance  Visit Diagnosis: Muscle weakness (generalized)  Unsteadiness on feet  Difficulty in walking, not elsewhere classified  Other symptoms and signs  involving the musculoskeletal system  Bilateral low back pain without sciatica  Pain in left knee  Pain in right knee     Problem List Patient Active Problem List   Diagnosis Date Noted  . Need for vaccination with 13-polyvalent pneumococcal conjugate vaccine 04/23/2015  . DNR no code (do not resuscitate) 04/25/2014  . Urinary incontinence 04/04/2013  . Osteoporosis, post-menopausal 12/06/2012  . At high risk for falls 07/30/2012  . MCI (mild cognitive impairment) 08/19/2011  . COPD (chronic obstructive pulmonary disease) ([place]) 06/25/2011  . CEREBROVASCULAR DISEASE 08/05/2009  . SPINAL STENOSIS 06/09/2007  . Coronary atherosclerosis 01/25/2007  . HIATAL HERNIA WITH REFLUX 01/25/2007  . Osteoarthritis 01/25/2007    4:21 PM,04/26/2015 Elly Modena PT, DPT [facility] [place] [address] [place], [place], [address] Phone: [phone]   Fax:  [phone]  Name: Jennifer [name] MRN: [id number] Date of Birth: 01-23-1922   *Addendum for pt d/c and updated G-Codes. PHYSICAL THERAPY DISCHARGE SUMMARY  Visits from Start of Care: 9  Current functional level related to goals / functional outcomes: Pt with minor improvements in activity tolerance and strength. See treatment not above for more details on performance.   Remaining deficits: Continues to demonstrate deficits in strength, balance and activity tolerance. Reported inconsistent adherence to HEP by both pt and her caregiver.    Education / Equipment: Pt's caregiver reporting she is not sure she wants to continue brining pt to PT due to lack of adherence at home. Pt is being discharged due to lack of progress and caregiver request. Plan: Patient agrees to discharge.  Patient goals were not met. Patient is being discharged due to lack of progress.  ?????  8:59 AM,10/04/15 [place], DPT [facility]  Outpatient Physical Therapy [phone]  9:03 AM,10/04/15 Elly Modena PT, [facility] [phone]

## 2015-04-30 ENCOUNTER — Other Ambulatory Visit: Payer: Self-pay

## 2015-04-30 MED ORDER — DONEPEZIL HCL 5 MG PO TABS: 5.0000 mg | ORAL_TABLET | Freq: Every day | ORAL | Status: DC

## 2015-06-11 ENCOUNTER — Ambulatory Visit (INDEPENDENT_AMBULATORY_CARE_PROVIDER_SITE_OTHER): Payer: Medicare Other

## 2015-06-11 DIAGNOSIS — Z111 Encounter for screening for respiratory tuberculosis: Secondary | ICD-10-CM | POA: Diagnosis not present

## 2015-06-11 NOTE — Progress Notes (Signed)
PPD placed in the left forearm with no complications. Will return Thursday to have it read and to collect form

## 2015-06-13 ENCOUNTER — Encounter: Payer: Self-pay | Admitting: Family Medicine

## 2015-06-13 ENCOUNTER — Other Ambulatory Visit: Payer: Self-pay | Admitting: Family Medicine

## 2015-06-13 ENCOUNTER — Ambulatory Visit (INDEPENDENT_AMBULATORY_CARE_PROVIDER_SITE_OTHER): Payer: Medicare Other | Admitting: Family Medicine

## 2015-06-13 VITALS — BP 128/78 | HR 73 | Resp 16 | Ht 65.0 in | Wt 215.0 lb

## 2015-06-13 DIAGNOSIS — Z0000 Encounter for general adult medical examination without abnormal findings: Secondary | ICD-10-CM

## 2015-06-13 NOTE — Assessment & Plan Note (Signed)

## 2015-06-13 NOTE — Patient Instructions (Signed)
F/u with rectal Sept 15 or after , please cancel August appointment  Fall Prevention in the Home  Falls can cause injuries. They can happen to people of all ages. There are many things you can do to make your home safe and to help prevent falls.  WHAT CAN I DO ON THE OUTSIDE OF MY HOME?  Regularly fix the edges of walkways and driveways and fix any cracks.  Remove anything that might make you trip as you walk through a door, such as a raised step or threshold.  Trim any bushes or trees on the path to your home.  Use bright outdoor lighting.  Clear any walking paths of anything that might make someone trip, such as rocks or tools.  Regularly check to see if handrails are loose or broken. Make sure that both sides of any steps have handrails.  Any raised decks and porches should have guardrails on the edges.  Have any leaves, snow, or ice cleared regularly.  Use sand or salt on walking paths during winter.  Clean up any spills in your garage right away. This includes oil or grease spills. WHAT CAN I DO IN THE BATHROOM?   Use night lights.  Install grab bars by the toilet and in the tub and shower. Do not use towel bars as grab bars.  Use non-skid mats or decals in the tub or shower.  If you need to sit down in the shower, use a plastic, non-slip stool.  Keep the floor dry. Clean up any water that spills on the floor as soon as it happens.  Remove soap buildup in the tub or shower regularly.  Attach bath mats securely with double-sided non-slip rug tape.  Do not have throw rugs and other things on the floor that can make you trip. WHAT CAN I DO IN THE BEDROOM?  Use night lights.  Make sure that you have a light by your bed that is easy to reach.  Do not use any sheets or blankets that are too big for your bed. They should not hang down onto the floor.  Have a firm chair that has side arms. You can use this for support while you get dressed.  Do not have throw rugs and  other things on the floor that can make you trip. WHAT CAN I DO IN THE KITCHEN?  Clean up any spills right away.  Avoid walking on wet floors.  Keep items that you use a lot in easy-to-reach places.  If you need to reach something above you, use a strong step stool that has a grab bar.  Keep electrical cords out of the way.  Do not use floor polish or wax that makes floors slippery. If you must use wax, use non-skid floor wax.  Do not have throw rugs and other things on the floor that can make you trip. WHAT CAN I DO WITH MY STAIRS?  Do not leave any items on the stairs.  Make sure that there are handrails on both sides of the stairs and use them. Fix handrails that are broken or loose. Make sure that handrails are as long as the stairways.  Check any carpeting to make sure that it is firmly attached to the stairs. Fix any carpet that is loose or worn.  Avoid having throw rugs at the top or bottom of the stairs. If you do have throw rugs, attach them to the floor with carpet tape.  Make sure that you have a light  switch at the top of the stairs and the bottom of the stairs. If you do not have them, ask someone to add them for you. WHAT ELSE CAN I DO TO HELP PREVENT FALLS?  Wear shoes that:  Do not have high heels.  Have rubber bottoms.  Are comfortable and fit you well.  Are closed at the toe. Do not wear sandals.  If you use a stepladder:  Make sure that it is fully opened. Do not climb a closed stepladder.  Make sure that both sides of the stepladder are locked into place.  Ask someone to hold it for you, if possible.  Clearly mark and make sure that you can see:  Any grab bars or handrails.  First and last steps.  Where the edge of each step is.  Use tools that help you move around (mobility aids) if they are needed. These include:  Canes.  Walkers.  Scooters.  Crutches.  Turn on the lights when you go into a dark area. Replace any light bulbs as  soon as they burn out.  Set up your furniture so you have a clear path. Avoid moving your furniture around.  If any of your floors are uneven, fix them.  If there are any pets around you, be aware of where they are.  Review your medicines with your doctor. Some medicines can make you feel dizzy. This can increase your chance of falling. Ask your doctor what other things that you can do to help prevent falls.   This information is not intended to replace advice given to you by your health care provider. Make sure you discuss any questions you have with your health care provider.   Document Released: 10/25/2008 Document Revised: 05/15/2014 Document Reviewed: 02/02/2014 Elsevier Interactive Patient Education [employer]

## 2015-06-13 NOTE — Progress Notes (Signed)
Subjective:    Patient ID: Jennifer [name], female    DOB: [birth year], 82 y.o.   MRN: [id number]  HPI Preventive Screening-Counseling & Management   Patient present here today for a Medicare annual wellness visit.   Current Problems (verified)   Medications Prior to Visit Allergies (verified)   PAST HISTORY  Family History (verified)   Social History Widow, 1 son 1 daughter, retired from [employer]    Risk Factors  Current exercise habits:  Daughter states she doesn't do any physical activity but given a list of chair exercises   Dietary issues discussed: heart healthy, low fat low carb    Cardiac risk factors:    Depression Screen  (Note: if answer to either of the following is "Yes", a more complete depression screening is indicated)   Over the past two weeks, have you felt down, depressed or hopeless? No  Over the past two weeks, have you felt little interest or pleasure in doing things? No  Have you lost interest or pleasure in daily life? No  Do you often feel hopeless? No  Do you cry easily over simple problems? No   Activities of Daily Living  In your present state of health, do you have any difficulty performing the following activities?  Driving?: doesn't drive  Managing money?: daughter manages money  Feeding yourself?:No Getting from bed to chair?: uses cane of a regular basis  Climbing a flight of stairs?: cannot climb stairs due to bad knees  Preparing food and eating?:No Bathing or showering?: uses shower chair and handles  Getting dressed?:No Getting to the toilet?:No Using the toilet?:No Moving around from place to place?: uses cane   Fall Risk Assessment In the past year have you fallen or had a near fall?: 4 Are you currently taking any medications that make you dizziy?:No   Hearing Difficulties: No Do you often ask people to speak up or repeat themselves?: sometimes  Do you experience ringing or noises in your ears?:No Do you have difficulty  understanding soft or whispered voices?: sometimes   Cognitive Testing  Alert? Yes Normal Appearance?Yes  Oriented to person? Yes Place? Yes  Time? Yes  Displays appropriate judgment?Yes  Can read the correct time from a watch face? yes Are you having problems remembering things?yes  Advanced Directives have been discussed with the patient?Yes, DNR   List the Names of Other Physician/Practitioners you currently use: Romeo AppleHarrison (ortho)    Indicate any recent Medical Services you may have received from other than [facility] providers in the past year (date may be approximate).   Assessment:    Annual Wellness Exam   Plan:     Medicare Attestation  I have personally reviewed:  The patient's medical and social history  Their use of alcohol, tobacco or illicit drugs  Their current medications and supplements  The patient's functional ability including ADLs,fall risks, home safety risks, cognitive, and hearing and visual impairment  Diet and physical activities  Evidence for depression or mood disorders  The patient's weight, height, BMI, and visual acuity have been recorded in the chart. I have made referrals, counseling, and provided education to the patient based on review of the above and I have provided the patient with a written personalized care plan for preventive services.      Review of Systems     Objective:   Physical Exam BP 128/78 mmHg  Pulse 73  Resp 16  Ht 5\' 5"  (1.651 m)  Wt 215 lb (97.523 kg)  BMI 35.78 kg/m2  SpO2 95%        Assessment & Plan:  Medicare annual wellness visit, subsequent Annual exam as documented. Counseling done  re healthy lifestyle involving commitment to 150 minutes exercise per week, heart healthy diet, and attaining healthy weight.The importance of adequate sleep also discussed. Regular seat belt use and home safety, is also discussed. Changes in health habits are decided on by the patient with goals and time frames  set for  achieving them. Immunization and cancer screening needs are specifically addressed at this visit.

## 2015-06-28 ENCOUNTER — Telehealth: Payer: Self-pay | Admitting: Family Medicine

## 2015-06-28 MED ORDER — TRAMADOL HCL 50 MG PO TABS: ORAL_TABLET | ORAL | Status: DC

## 2015-06-28 NOTE — Telephone Encounter (Signed)
Jennifer [name] (daughter) has a question about one of [name] Medications, please advise?

## 2015-06-28 NOTE — Telephone Encounter (Signed)
Daughter is concerned because patient is taking all her prescribed meds but is still complaining of bad back and knee pain throughout the day (taking celebrex and tylenol in the am and gabapentin midday with tylenol and gabapentin 2 at bedtime) Wants to know if there is anything else you can give her. Please advise   Also said you had recommended someone who could come in a couple days a week incase she needed to run out so her mother wouldn't be alone. Has decided she wants that now.

## 2015-06-28 NOTE — Telephone Encounter (Signed)
I recommend starting tramadol in addition to what she has, script says twice daily, may try one daily after 5 days may increase to twice daily if she needs this May contact for in home sitting with [facility] services

## 2015-06-28 NOTE — Telephone Encounter (Signed)
Daughter aware.

## 2015-07-09 ENCOUNTER — Ambulatory Visit: Payer: Medicare Other | Admitting: Orthopedic Surgery

## 2015-08-05 ENCOUNTER — Encounter: Payer: Medicare Other | Admitting: Orthopedic Surgery

## 2015-08-27 ENCOUNTER — Encounter: Payer: Medicare Other | Admitting: Family Medicine

## 2015-09-10 ENCOUNTER — Ambulatory Visit (INDEPENDENT_AMBULATORY_CARE_PROVIDER_SITE_OTHER): Payer: Medicare Other

## 2015-09-10 ENCOUNTER — Ambulatory Visit (INDEPENDENT_AMBULATORY_CARE_PROVIDER_SITE_OTHER): Payer: Medicare Other | Admitting: Orthopedic Surgery

## 2015-09-10 ENCOUNTER — Encounter: Payer: Self-pay | Admitting: Orthopedic Surgery

## 2015-09-10 VITALS — BP 137/41 | HR 98 | Wt 213.0 lb

## 2015-09-10 DIAGNOSIS — Z96651 Presence of right artificial knee joint: Secondary | ICD-10-CM | POA: Diagnosis not present

## 2015-09-10 NOTE — Progress Notes (Signed)
Patient ID: Juanetta BeetsVelma M Blais, female   DOB: Mar 24, 1922, 82 y.o.   MRN: [id number]  Post op annual TKA   Chief Complaint  Patient presents with  . Follow-up    annual xray right total knee replacement, DOS 09/24/09    HPI Jiali Myra GianottiM Knoche is a 82 y.o. female.  Presents for 6 year follow-up status post right total knee. She also had [place] in [place]. She also has spinal stenosis and ongoing back pain she's currently on tramadol, Tylenol and gabapentin and Celebrex   Past Medical History:  Diagnosis Date  . Anemia    iron deficiency post op.  Marland Kitchen. CAD (coronary artery disease) 2008   Cath 40% mid LAD, 35% RCA  . Cellulitis of finger    right   . Chest pain 2006  . Chronic back pain   . COPD (chronic obstructive pulmonary disease) (HCC)   . Gastroesophageal reflux disease   . Hiatal hernia   . HTN (hypertension) 04/22/2014  . Hyperlipidemia   . Nicotine addiction   . Osteoarthritis    status post left TKA; surgery on the right is anticipated in the near future   . Pulmonary nodules    stable since 2006  . Shingles    right breast   . Skin infection   . Ulcer    gential  . Use of cane as ambulatory aid     Past Surgical History:  Procedure Laterality Date  . ABDOMINAL HYSTERECTOMY    . BREAST SURGERY N/A    bilateral  . CARDIAC CATHETERIZATION  2008   Mid LAD 40%, RCA 25%.   . CHOLECYSTECTOMY    . COMBINED HYSTERECTOMY ABDOMINAL W/ A&P REPAIR / OOPHORECTOMY  approx. 40 years ago   . MASTECTOMY  1998 & 2007    right -1998 / left 2007  . right knee replacement  09/2009   Dr. Romeo AppleHarrison  . TOTAL KNEE ARTHROPLASTY  4/08   left      Allergies  Allergen Reactions  . Iohexol      Desc: PT ALLERGIC TO CONTRAST- SHE CAN'T BREATH   . Iron   . Penicillins     Has patient had a PCN reaction causing immediate rash, facial/tongue/throat swelling, SOB or lightheadedness with hypotension:  unknown Has patient had a PCN reaction causing severe rash involving mucus  membranes or skin necrosis: unknown Has patient had a PCN reaction that required hospitalization unknown Has patient had a PCN reaction occurring within the last 10 years: unknown If all of the above answers are "NO", then may proceed with Cephalosporin use.     Current Outpatient Prescriptions  Medication Sig Dispense Refill  . acetaminophen (TYLENOL) 500 MG tablet Take 1,000 mg by mouth 2 (two) times daily. *May take an additional dose if needed for pain    . albuterol (PROVENTIL HFA;VENTOLIN HFA) 108 (90 BASE) MCG/ACT inhaler Inhale 2 puffs into the lungs every 6 (six) hours as needed for wheezing or shortness of breath. 1 Inhaler 2  . celecoxib (CELEBREX) 100 MG capsule Take 1 capsule (100 mg total) by mouth daily. 30 capsule 5  . donepezil (ARICEPT) 5 MG tablet Take 1 tablet (5 mg total) by mouth at bedtime. 90 tablet 1  . gabapentin (NEURONTIN) 300 MG capsule TAKE 1 CAPSULE BY MOUTH EVERY MORNING THEN TAKE 2 CAPSULES BY MOUTH EVERY NIGHT AT BEDTIME 270 capsule 1  . gabapentin (NEURONTIN) 300 MG capsule TAKE 1 CAPSULE BY MOUTH EVERY MORNING THEN  TAKE 2 CAPSULES BY MOUTH EVERY NIGHT AT BEDTIME 270 capsule 1  . lidocaine (LIDODERM) 5 % Place 3 patches onto the skin daily. Remove & Discard patch within 12 hours or as directed by MD. *Patient states that these are applied to wrist, leg, and back* (Patient taking differently: Place 3 patches onto the skin daily as needed (for pain). Remove & Discard patch within 12 hours or as directed by MD. *Patient states that these are applied to wrist, leg, and back*) 270 patch 0  . Multiple Vitamin (MULTIVITAMIN WITH MINERALS) TABS tablet Take 1 tablet by mouth daily.    Marland Kitchen omega-3 acid ethyl esters (LOVAZA) 1 g capsule One daily    . traMADol (ULTRAM) 50 MG tablet One tablet twice daily 60 tablet 3   No current facility-administered medications for this visit.     Review of Systems Review of Systems  Musculoskeletal: Positive for back pain and gait  problem.  Neurological: Positive for weakness.     Physical Exam Blood pressure (!) 137/41, pulse 98, weight 213 lb (96.6 kg).   Gen. appearance is normal there are no congenital abnormalities   The patient is oriented 3   Mood and affect are normal   Ambulation is without assistive device   Knee inspection reveals a well-healed incision with no swelling   Knee flexion 110  Stability in the anteroposterior plane is normal as well as in the medial lateral plane  Motor exam reveals  extension without extensor lag   Data Reviewed KNEE XRAYS : Normal no loosening   Assessment S/P right TKA   Plan    X-ray left knee which was done in [place] and one month       [employer] 09/10/2015, 11:38 AM

## 2015-09-13 ENCOUNTER — Other Ambulatory Visit: Payer: Self-pay | Admitting: Family Medicine

## 2015-09-24 ENCOUNTER — Ambulatory Visit (INDEPENDENT_AMBULATORY_CARE_PROVIDER_SITE_OTHER): Payer: Medicare Other

## 2015-09-24 DIAGNOSIS — Z23 Encounter for immunization: Secondary | ICD-10-CM | POA: Diagnosis not present

## 2015-09-30 ENCOUNTER — Telehealth: Payer: Self-pay | Admitting: Family Medicine

## 2015-09-30 NOTE — Telephone Encounter (Signed)
Tried to leave message but no vm set up - needs to reschedule bc Dr. Lodema HongSimpson out of town

## 2015-10-08 ENCOUNTER — Ambulatory Visit: Payer: Medicare Other | Admitting: Family Medicine

## 2015-10-20 ENCOUNTER — Emergency Department (HOSPITAL_COMMUNITY)
Admission: EM | Admit: 2015-10-20 | Discharge: 2015-10-20 | Disposition: A | Discharge status: Home / Self Care | Payer: Medicare Other | Attending: Emergency Medicine | Admitting: Emergency Medicine

## 2015-10-20 ENCOUNTER — Encounter (HOSPITAL_COMMUNITY): Payer: Self-pay | Admitting: Emergency Medicine

## 2015-10-20 ENCOUNTER — Emergency Department (HOSPITAL_COMMUNITY): Payer: Medicare Other

## 2015-10-20 DIAGNOSIS — R1011 Right upper quadrant pain: Secondary | ICD-10-CM | POA: Diagnosis not present

## 2015-10-20 DIAGNOSIS — I10 Essential (primary) hypertension: Secondary | ICD-10-CM | POA: Insufficient documentation

## 2015-10-20 DIAGNOSIS — I2510 Atherosclerotic heart disease of native coronary artery without angina pectoris: Secondary | ICD-10-CM | POA: Insufficient documentation

## 2015-10-20 DIAGNOSIS — N3000 Acute cystitis without hematuria: Secondary | ICD-10-CM | POA: Insufficient documentation

## 2015-10-20 DIAGNOSIS — Z79899 Other long term (current) drug therapy: Secondary | ICD-10-CM | POA: Diagnosis not present

## 2015-10-20 DIAGNOSIS — Z87891 Personal history of nicotine dependence: Secondary | ICD-10-CM | POA: Insufficient documentation

## 2015-10-20 DIAGNOSIS — R109 Unspecified abdominal pain: Secondary | ICD-10-CM | POA: Diagnosis not present

## 2015-10-20 DIAGNOSIS — J449 Chronic obstructive pulmonary disease, unspecified: Secondary | ICD-10-CM | POA: Insufficient documentation

## 2015-10-20 LAB — COMPREHENSIVE METABOLIC PANEL
ALBUMIN: 3.4 g/dL — AB (ref 3.5–5.0)
ALT: 18 U/L (ref 14–54)
AST: 22 U/L (ref 15–41)
Alkaline Phosphatase: 86 U/L (ref 38–126)
Anion gap: 6 (ref 5–15)
BUN: 21 mg/dL — ABNORMAL HIGH (ref 6–20)
CHLORIDE: 106 mmol/L (ref 101–111)
CO2: 31 mmol/L (ref 22–32)
CREATININE: 0.9 mg/dL (ref 0.44–1.00)
Calcium: 8.7 mg/dL — ABNORMAL LOW (ref 8.9–10.3)
GFR calc non Af Amer: 54 mL/min — ABNORMAL LOW (ref >60)
GLUCOSE: 88 mg/dL (ref 65–99)
Potassium: 3.3 mmol/L — ABNORMAL LOW (ref 3.5–5.1)
SODIUM: 143 mmol/L (ref 135–145)
Total Bilirubin: 0.4 mg/dL (ref 0.3–1.2)
Total Protein: 6.9 g/dL (ref 6.5–8.1)

## 2015-10-20 LAB — CBC
HCT: 41.1 % (ref 36.0–46.0)
Hemoglobin: 12.6 g/dL (ref 12.0–15.0)
MCH: 29.5 pg (ref 26.0–34.0)
MCHC: 30.7 g/dL (ref 30.0–36.0)
MCV: 96.3 fL (ref 78.0–100.0)
PLATELETS: 214 10*3/uL (ref 150–400)
RBC: 4.27 MIL/uL (ref 3.87–5.11)
RDW: 15.1 % (ref 11.5–15.5)
WBC: 4.9 10*3/uL (ref 4.0–10.5)

## 2015-10-20 LAB — URINALYSIS, ROUTINE W REFLEX MICROSCOPIC
BILIRUBIN URINE: NEGATIVE
GLUCOSE, UA: NEGATIVE mg/dL
Nitrite: POSITIVE — AB
PROTEIN: 30 mg/dL — AB
SPECIFIC GRAVITY, URINE: 1.02 (ref 1.005–1.030)
pH: 6 (ref 5.0–8.0)

## 2015-10-20 LAB — LIPASE, BLOOD: LIPASE: 16 U/L (ref 11–51)

## 2015-10-20 LAB — URINE MICROSCOPIC-ADD ON

## 2015-10-20 MED ORDER — CEPHALEXIN 500 MG PO CAPS: ORAL_CAPSULE | ORAL | 0 refills | Status: DC

## 2015-10-20 MED ORDER — CEFTRIAXONE SODIUM 1 G IJ SOLR
1.0000 g | Freq: Once | INTRAMUSCULAR | Status: AC
  Administered 2015-10-20: 1 g via INTRAVENOUS
  Filled 2015-10-20: qty 10

## 2015-10-20 MED ORDER — ONDANSETRON HCL 4 MG PO TABS: 4.0000 mg | ORAL_TABLET | Freq: Three times a day (TID) | ORAL | 0 refills | Status: DC | PRN

## 2015-10-20 NOTE — ED Notes (Signed)
Pt to CT at this time.

## 2015-10-20 NOTE — ED Notes (Signed)
Pt returned from CT at this time.  

## 2015-10-20 NOTE — ED Provider Notes (Addendum)
[facility] Provider Note   CSN: [id number] Arrival date & time: 10/20/15  4098 By signing my name below, I, Jennifer [name], attest that this documentation has been prepared under the direction and in the presence of No att. providers found . Electronically Signed: Levon [name], Scribe. 10/20/2015. 10:18 AM.   History   Chief Complaint Chief Complaint  Patient presents with  . Abdominal Pain   HPI Jennifer [name] is a 82 y.o. female with hx of CAD, COPD, and HTN who presents to the Emergency Department complaining of sudden onset right-sided abdominal pain with radiation into right lower back onset this morning. Pt describes her pain as severe, sharp and stabbing. Pt was getting dressed for church when the pain began. She has never experienced this before. Per daughter, the pain lasted about 15 minutes. She has taken her medication this morning which did not provide immediate relief. She was able to eat breakfast this morning with no difficulties. No hx of kidney stones. Pt denies any chest pain, nausea, vomiting, diarrhea, fever, chills, cough, dysuria, urinary frequency, or hematuria.  The history is provided by the patient and a relative. No language interpreter was used.   Past Medical History:  Diagnosis Date  . Anemia    iron deficiency post op.  Marland Kitchen CAD (coronary artery disease) 2008   Cath 40% mid LAD, 35% RCA  . Cellulitis of finger    right   . Chest pain 2006  . Chronic back pain   . COPD (chronic obstructive pulmonary disease) (HCC)   . Gastroesophageal reflux disease   . Hiatal hernia   . HTN (hypertension) 04/22/2014  . Hyperlipidemia   . Nicotine addiction   . Osteoarthritis    status post left TKA; surgery on the right is anticipated in the near future   . Pulmonary nodules    stable since 2006  . Shingles    right breast   . Skin infection   . Ulcer (HCC)    gential  . Use of cane as ambulatory aid    Patient Active Problem List   Diagnosis  Date Noted  . Medicare annual wellness visit, subsequent 06/13/2015  . DNR no code (do not resuscitate) 04/25/2014  . Urinary incontinence 04/04/2013  . Osteoporosis, post-menopausal 12/06/2012  . At high risk for falls 07/30/2012  . MCI (mild cognitive impairment) 08/19/2011  . COPD (chronic obstructive pulmonary disease) (HCC) 06/25/2011  . CEREBROVASCULAR DISEASE 08/05/2009  . SPINAL STENOSIS 06/09/2007  . Coronary atherosclerosis 01/25/2007  . HIATAL HERNIA WITH REFLUX 01/25/2007  . Osteoarthritis 01/25/2007   Past Surgical History:  Procedure Laterality Date  . ABDOMINAL HYSTERECTOMY    . BREAST SURGERY N/A    bilateral  . CARDIAC CATHETERIZATION  2008   Mid LAD 40%, RCA 25%.   . CHOLECYSTECTOMY    . COMBINED HYSTERECTOMY ABDOMINAL W/ A&P REPAIR / OOPHORECTOMY  approx. 40 years ago   . MASTECTOMY  1998 & 2007    right -1998 / left 2007  . right knee replacement  09/2009   Dr. Romeo Apple  . TOTAL KNEE ARTHROPLASTY  4/08   left     OB History    Gravida Para Term Preterm AB Living             1   SAB TAB Ectopic Multiple Live Births                  Home Medications    Prior to Admission medications  Medication Sig Start Date End Date Taking? Authorizing Provider  acetaminophen (TYLENOL) 500 MG tablet Take 1,000 mg by mouth 2 (two) times daily. *May take an additional dose if needed for pain   Yes Historical Provider, MD  albuterol (PROVENTIL HFA;VENTOLIN HFA) 108 (90 BASE) MCG/ACT inhaler Inhale 2 puffs into the lungs every 6 (six) hours as needed for wheezing or shortness of breath. 08/27/14  Yes [name], MD  celecoxib (CELEBREX) 100 MG capsule TAKE 1 CAPSULE(100 MG) BY MOUTH DAILY 09/17/15  Yes [name], MD  donepezil (ARICEPT) 5 MG tablet Take 1 tablet (5 mg total) by mouth at bedtime. 04/30/15  Yes [name], MD  gabapentin (NEURONTIN) 300 MG capsule TAKE 1 CAPSULE BY MOUTH EVERY MORNING THEN TAKE 2 CAPSULES BY MOUTH EVERY NIGHT AT  BEDTIME 12/11/14  Yes [name], MD  Multiple Vitamin (MULTIVITAMIN WITH MINERALS) TABS tablet Take 1 tablet by mouth daily.   Yes Historical Provider, MD  traMADol Janean Sark) 50 MG tablet One tablet twice daily 06/28/15  Yes [name], MD  cephALEXin (KEFLEX) 500 MG capsule 2 caps po bid x 7 days 10/20/15   Marily Memos, MD  lidocaine (LIDODERM) 5 % Place 3 patches onto the skin daily. Remove & Discard patch within 12 hours or as directed by MD. *Patient states that these are applied to wrist, leg, and back* Patient taking differently: Place 3 patches onto the skin daily as needed (for pain). Remove & Discard patch within 12 hours or as directed by MD. *Patient states that these are applied to wrist, leg, and back* 12/13/13   [name], MD  ondansetron (ZOFRAN) 4 MG tablet Take 1 tablet (4 mg total) by mouth every 8 (eight) hours as needed for nausea or vomiting. 10/20/15   Marily Memos, MD    Family History Family History  Problem Relation Age of Onset  . Cancer Mother 47    breast   . Cancer Father 10    prostate  . Hypertension Father   . Hypertension Sister   . Heart disease Sister   . Heart disease Sister   . Heart disease Sister     Social History Social History  Substance Use Topics  . Smoking status: Former Smoker    Packs/day: 0.25    Years: 70.00    Types: Cigarettes    Quit date: 03/13/2015  . Smokeless tobacco: Never Used  . Alcohol use No    Allergies   Iohexol; Iron; and Penicillins  Review of Systems Review of Systems  Constitutional: Negative for chills and fever.  Respiratory: Negative for cough.   Cardiovascular: Negative for chest pain.  Gastrointestinal: Positive for abdominal pain. Negative for diarrhea, nausea and vomiting.  Genitourinary: Negative for dysuria, frequency and hematuria.  All other systems reviewed and are negative.  Physical Exam Updated Vital Signs BP 160/90   Pulse 65   Temp 98.5 F (36.9 C) (Oral)   Resp  16   Ht 5\' 5"  (1.651 m)   Wt 213 lb (96.6 kg)   SpO2 97%   BMI 35.45 kg/m   Physical Exam  Constitutional: She is oriented to person, place, and time. She appears well-developed and well-nourished. No distress.  HENT:  Head: Normocephalic and atraumatic.  Eyes: Conjunctivae are normal.  Cardiovascular: Normal rate.   Murmur heard.  Systolic murmur is present with a grade of 2/6  Normal pulses  Pulmonary/Chest: Effort normal.  Abdominal: Bowel sounds are normal. She exhibits  no distension and no mass. There is tenderness (RLQ and RUQ tenderness). There is no rebound and no guarding.  No RUQ guarding; minimal RUQ tenderness when distracted  Musculoskeletal: Normal range of motion. She exhibits no edema or deformity.  Neurological: She is alert and oriented to person, place, and time.  Skin: Skin is warm and dry.  Psychiatric: She has a normal mood and affect.  Nursing note and vitals reviewed.  ED Treatments / Results  DIAGNOSTIC STUDIES:  Oxygen Saturation is 96% on RA, adequate by my interpretation.    COORDINATION OF CARE:  10:18 AM Will order urinalysis, CBC, CMP, and lipase. Discussed treatment plan with pt at bedside and pt agreed to plan.   Labs (all labs ordered are listed, but only abnormal results are displayed) Labs Reviewed  URINE CULTURE - Abnormal; Notable for the following:       Result Value   Culture >=100,000 COLONIES/mL ESCHERICHIA COLI (*)    All other components within normal limits  COMPREHENSIVE METABOLIC PANEL - Abnormal; Notable for the following:    Potassium 3.3 (*)    BUN 21 (*)    Calcium 8.7 (*)    Albumin 3.4 (*)    GFR calc non Af Amer 54 (*)    All other components within normal limits  URINALYSIS, ROUTINE W REFLEX MICROSCOPIC (NOT AT [facility]) - Abnormal; Notable for the following:    APPearance HAZY (*)    Hgb urine dipstick TRACE (*)    Ketones, ur TRACE (*)    Protein, ur 30 (*)    Nitrite POSITIVE (*)    Leukocytes, UA MODERATE (*)     All other components within normal limits  URINE MICROSCOPIC-ADD ON - Abnormal; Notable for the following:    Squamous Epithelial / LPF 6-30 (*)    Bacteria, UA MANY (*)    All other components within normal limits  LIPASE, BLOOD  CBC    EKG  EKG Interpretation None       Radiology No results found.  Procedures Procedures (including critical care time)  Medications Ordered in ED Medications  cefTRIAXone (ROCEPHIN) 1 g in dextrose 5 % 50 mL IVPB (0 g Intravenous Stopped 10/20/15 1448)    Initial Impression / Assessment and Plan / ED Course  I have reviewed the triage vital signs and the nursing notes.  Pertinent labs & imaging results that were available during my care of the patient were reviewed by me and considered in my medical decision making (see chart for details).  Clinical Course   Abdominal pain likely 2/2 UTI. No e/o stone or pyelo nephritis. CT negative otherwise. Started on rocephin, will continue keflex at home. Antiemetics given as well. Close PCP follow up recommended. Otherwise strict return precautions as well.  Final Clinical Impressions(s) / ED Diagnoses   Final diagnoses:  Acute cystitis without hematuria   New Prescriptions Discharge Medication List as of 10/20/2015  2:33 PM    START taking these medications   Details  cephALEXin (KEFLEX) 500 MG capsule 2 caps po bid x 7 days, Print    ondansetron (ZOFRAN) 4 MG tablet Take 1 tablet (4 mg total) by mouth every 8 (eight) hours as needed for nausea or vomiting., Starting Sun 10/20/2015, Print      I personally performed the services described in this documentation, which was scribed in my presence. The recorded information has been reviewed and is accurate.     Marily Memos, MD 10/23/15 [id number]    Barbara Cower  Brandilynn Taormina, MD 10/23/15 [id number]

## 2015-10-20 NOTE — ED Triage Notes (Signed)
Patient c/o right side abd pain that radiates into right lower back. Per patient started this morning as a sharp stabbing pain. Denies any nausea, vomiting, diarrhea, fever, or urinary symptoms. Per patient last BM yesterday-normal.

## 2015-10-22 ENCOUNTER — Other Ambulatory Visit: Payer: Self-pay | Admitting: Family Medicine

## 2015-10-23 LAB — URINE CULTURE

## 2015-10-24 ENCOUNTER — Telehealth (HOSPITAL_BASED_OUTPATIENT_CLINIC_OR_DEPARTMENT_OTHER): Payer: Self-pay | Admitting: Emergency Medicine

## 2015-10-24 NOTE — Telephone Encounter (Signed)
Post ED Visit - Positive Culture Follow-up  Culture report reviewed by antimicrobial stewardship pharmacist:  []  Enzo BiNathan Batchelder, Pharm.D. []  Celedonio MiyamotoJeremy Frens, Pharm.D., BCPS []  Garvin FilaMike Maccia, Pharm.D. []  Georgina PillionElizabeth Martin, Pharm.D., BCPS []  DellMinh Pham, [address]Pharm[address]D., BCPS, AAHIVP []  Estella HuskMichelle Turner, Pharm.D., BCPS, AAHIVP []  Tennis Mustassie Stewart, Pharm.D. []  Sherle Poeob Vincent, [address]Pharm[address] Mackie Paienee Ackley PharmD  Positive urine culture Treated with cephalexin, organism sensitive to the same and no further patient follow-up is required at this time.  Berle MullMiller, Tailyn Hantz 10/24/2015, 9:48 AM

## 2015-12-09 ENCOUNTER — Other Ambulatory Visit: Payer: Self-pay | Admitting: Family Medicine

## 2015-12-18 ENCOUNTER — Other Ambulatory Visit: Payer: Self-pay | Admitting: Family Medicine

## 2015-12-19 ENCOUNTER — Other Ambulatory Visit: Payer: Self-pay | Admitting: Family Medicine

## 2016-01-28 DIAGNOSIS — H1851 Endothelial corneal dystrophy: Secondary | ICD-10-CM | POA: Diagnosis not present

## 2016-01-28 DIAGNOSIS — H2513 Age-related nuclear cataract, bilateral: Secondary | ICD-10-CM | POA: Diagnosis not present

## 2016-01-28 DIAGNOSIS — H16143 Punctate keratitis, bilateral: Secondary | ICD-10-CM | POA: Diagnosis not present

## 2016-01-28 DIAGNOSIS — H16423 Pannus (corneal), bilateral: Secondary | ICD-10-CM | POA: Diagnosis not present

## 2016-02-11 DIAGNOSIS — H25811 Combined forms of age-related cataract, right eye: Secondary | ICD-10-CM | POA: Diagnosis not present

## 2016-02-11 DIAGNOSIS — H02135 Senile ectropion of left lower eyelid: Secondary | ICD-10-CM | POA: Diagnosis not present

## 2016-02-11 DIAGNOSIS — H02413 Mechanical ptosis of bilateral eyelids: Secondary | ICD-10-CM | POA: Diagnosis not present

## 2016-02-11 DIAGNOSIS — H25812 Combined forms of age-related cataract, left eye: Secondary | ICD-10-CM | POA: Diagnosis not present

## 2016-02-11 DIAGNOSIS — H02132 Senile ectropion of right lower eyelid: Secondary | ICD-10-CM | POA: Diagnosis not present

## 2016-02-24 DIAGNOSIS — H2512 Age-related nuclear cataract, left eye: Secondary | ICD-10-CM | POA: Diagnosis not present

## 2016-02-24 DIAGNOSIS — H25812 Combined forms of age-related cataract, left eye: Secondary | ICD-10-CM | POA: Diagnosis not present

## 2016-03-23 ENCOUNTER — Inpatient Hospital Stay (HOSPITAL_COMMUNITY)
Admission: EM | Admit: 2016-03-23 | Discharge: 2016-03-27 | DRG: 871 | Disposition: A | Discharge status: Skilled Nursing Facility | Payer: Medicare Other | Attending: Family Medicine | Admitting: Family Medicine

## 2016-03-23 ENCOUNTER — Emergency Department (HOSPITAL_COMMUNITY): Payer: Medicare Other

## 2016-03-23 ENCOUNTER — Encounter (HOSPITAL_COMMUNITY): Payer: Self-pay | Admitting: Cardiology

## 2016-03-23 DIAGNOSIS — J9601 Acute respiratory failure with hypoxia: Secondary | ICD-10-CM | POA: Diagnosis not present

## 2016-03-23 DIAGNOSIS — Z803 Family history of malignant neoplasm of breast: Secondary | ICD-10-CM

## 2016-03-23 DIAGNOSIS — J181 Lobar pneumonia, unspecified organism: Secondary | ICD-10-CM | POA: Diagnosis present

## 2016-03-23 DIAGNOSIS — N39498 Other specified urinary incontinence: Secondary | ICD-10-CM

## 2016-03-23 DIAGNOSIS — A419 Sepsis, unspecified organism: Principal | ICD-10-CM | POA: Diagnosis present

## 2016-03-23 DIAGNOSIS — J189 Pneumonia, unspecified organism: Secondary | ICD-10-CM

## 2016-03-23 DIAGNOSIS — M4800 Spinal stenosis, site unspecified: Secondary | ICD-10-CM | POA: Diagnosis present

## 2016-03-23 DIAGNOSIS — R1310 Dysphagia, unspecified: Secondary | ICD-10-CM | POA: Diagnosis not present

## 2016-03-23 DIAGNOSIS — Z87891 Personal history of nicotine dependence: Secondary | ICD-10-CM | POA: Diagnosis not present

## 2016-03-23 DIAGNOSIS — Z96651 Presence of right artificial knee joint: Secondary | ICD-10-CM | POA: Diagnosis present

## 2016-03-23 DIAGNOSIS — M158 Other polyosteoarthritis: Secondary | ICD-10-CM

## 2016-03-23 DIAGNOSIS — N390 Urinary tract infection, site not specified: Secondary | ICD-10-CM | POA: Diagnosis present

## 2016-03-23 DIAGNOSIS — Z9013 Acquired absence of bilateral breasts and nipples: Secondary | ICD-10-CM

## 2016-03-23 DIAGNOSIS — Z8673 Personal history of transient ischemic attack (TIA), and cerebral infarction without residual deficits: Secondary | ICD-10-CM

## 2016-03-23 DIAGNOSIS — Z8042 Family history of malignant neoplasm of prostate: Secondary | ICD-10-CM

## 2016-03-23 DIAGNOSIS — J440 Chronic obstructive pulmonary disease with acute lower respiratory infection: Secondary | ICD-10-CM | POA: Diagnosis not present

## 2016-03-23 DIAGNOSIS — F0390 Unspecified dementia without behavioral disturbance: Secondary | ICD-10-CM | POA: Diagnosis present

## 2016-03-23 DIAGNOSIS — I2510 Atherosclerotic heart disease of native coronary artery without angina pectoris: Secondary | ICD-10-CM | POA: Diagnosis present

## 2016-03-23 DIAGNOSIS — R509 Fever, unspecified: Secondary | ICD-10-CM

## 2016-03-23 DIAGNOSIS — Z66 Do not resuscitate: Secondary | ICD-10-CM | POA: Diagnosis present

## 2016-03-23 DIAGNOSIS — J449 Chronic obstructive pulmonary disease, unspecified: Secondary | ICD-10-CM | POA: Diagnosis present

## 2016-03-23 DIAGNOSIS — Z87440 Personal history of urinary (tract) infections: Secondary | ICD-10-CM

## 2016-03-23 DIAGNOSIS — Z8249 Family history of ischemic heart disease and other diseases of the circulatory system: Secondary | ICD-10-CM

## 2016-03-23 DIAGNOSIS — R32 Unspecified urinary incontinence: Secondary | ICD-10-CM | POA: Diagnosis present

## 2016-03-23 DIAGNOSIS — Z91041 Radiographic dye allergy status: Secondary | ICD-10-CM

## 2016-03-23 DIAGNOSIS — M1990 Unspecified osteoarthritis, unspecified site: Secondary | ICD-10-CM | POA: Diagnosis present

## 2016-03-23 LAB — URINALYSIS, ROUTINE W REFLEX MICROSCOPIC
Glucose, UA: NEGATIVE mg/dL
Ketones, ur: 15 mg/dL — AB
NITRITE: POSITIVE — AB
PH: 6 (ref 5.0–8.0)
Protein, ur: >300 mg/dL — AB
Specific Gravity, Urine: >1.03 — ABNORMAL HIGH (ref 1.005–1.030)

## 2016-03-23 LAB — CBC WITH DIFFERENTIAL/PLATELET
BASOS ABS: 0 10*3/uL (ref 0.0–0.1)
BASOS PCT: 0 %
Eosinophils Absolute: 0 10*3/uL (ref 0.0–0.7)
Eosinophils Relative: 0 %
HEMATOCRIT: 40.7 % (ref 36.0–46.0)
HEMOGLOBIN: 12.9 g/dL (ref 12.0–15.0)
Lymphocytes Relative: 5 %
Lymphs Abs: 0.7 10*3/uL (ref 0.7–4.0)
MCH: 30.4 pg (ref 26.0–34.0)
MCHC: 31.7 g/dL (ref 30.0–36.0)
MCV: 96 fL (ref 78.0–100.0)
Monocytes Absolute: 1.4 10*3/uL — ABNORMAL HIGH (ref 0.1–1.0)
Monocytes Relative: 10 %
NEUTROS ABS: 11.4 10*3/uL — AB (ref 1.7–7.7)
NEUTROS PCT: 85 %
Platelets: 188 10*3/uL (ref 150–400)
RBC: 4.24 MIL/uL (ref 3.87–5.11)
RDW: 14.6 % (ref 11.5–15.5)
WBC: 13.5 10*3/uL — ABNORMAL HIGH (ref 4.0–10.5)

## 2016-03-23 LAB — URINALYSIS, MICROSCOPIC (REFLEX)

## 2016-03-23 LAB — COMPREHENSIVE METABOLIC PANEL
ALK PHOS: 141 U/L — AB (ref 38–126)
ALT: 45 U/L (ref 14–54)
ANION GAP: 13 (ref 5–15)
AST: 69 U/L — ABNORMAL HIGH (ref 15–41)
Albumin: 3.6 g/dL (ref 3.5–5.0)
BUN: 18 mg/dL (ref 6–20)
CALCIUM: 8.9 mg/dL (ref 8.9–10.3)
CO2: 24 mmol/L (ref 22–32)
Chloride: 104 mmol/L (ref 101–111)
Creatinine, Ser: 0.96 mg/dL (ref 0.44–1.00)
GFR calc non Af Amer: 50 mL/min — ABNORMAL LOW (ref >60)
GFR, EST AFRICAN AMERICAN: 58 mL/min — AB (ref >60)
Glucose, Bld: 142 mg/dL — ABNORMAL HIGH (ref 65–99)
Potassium: 3.5 mmol/L (ref 3.5–5.1)
SODIUM: 141 mmol/L (ref 135–145)
TOTAL PROTEIN: 8 g/dL (ref 6.5–8.1)
Total Bilirubin: 1.2 mg/dL (ref 0.3–1.2)

## 2016-03-23 LAB — LACTIC ACID, PLASMA
LACTIC ACID, VENOUS: 1.8 mmol/L (ref 0.5–1.9)
Lactic Acid, Venous: 2.1 mmol/L (ref 0.5–1.9)

## 2016-03-23 MED ORDER — DONEPEZIL HCL 5 MG PO TABS
5.0000 mg | ORAL_TABLET | Freq: Every day | ORAL | Status: DC
  Administered 2016-03-23 – 2016-03-26 (×4): 5 mg via ORAL
  Filled 2016-03-23 (×4): qty 1

## 2016-03-23 MED ORDER — GABAPENTIN 300 MG PO CAPS
600.0000 mg | ORAL_CAPSULE | Freq: Every day | ORAL | Status: DC
  Administered 2016-03-23 – 2016-03-26 (×4): 600 mg via ORAL
  Filled 2016-03-23 (×4): qty 2

## 2016-03-23 MED ORDER — ACETAMINOPHEN 325 MG PO TABS
650.0000 mg | ORAL_TABLET | Freq: Four times a day (QID) | ORAL | Status: DC | PRN
  Administered 2016-03-25 – 2016-03-27 (×3): 650 mg via ORAL
  Filled 2016-03-23 (×3): qty 2

## 2016-03-23 MED ORDER — ALBUTEROL SULFATE (2.5 MG/3ML) 0.083% IN NEBU
2.5000 mg | INHALATION_SOLUTION | Freq: Four times a day (QID) | RESPIRATORY_TRACT | Status: DC | PRN
  Administered 2016-03-23 – 2016-03-24 (×2): 2.5 mg via RESPIRATORY_TRACT
  Filled 2016-03-23 (×2): qty 3

## 2016-03-23 MED ORDER — LIDOCAINE 5 % EX PTCH: 3.0000 | MEDICATED_PATCH | CUTANEOUS | Status: DC

## 2016-03-23 MED ORDER — ONDANSETRON 4 MG PO TBDP: 4.0000 mg | ORAL_TABLET | Freq: Once | ORAL | Status: DC

## 2016-03-23 MED ORDER — AZITHROMYCIN 250 MG PO TABS
500.0000 mg | ORAL_TABLET | ORAL | Status: DC
  Administered 2016-03-24 – 2016-03-27 (×4): 500 mg via ORAL
  Filled 2016-03-23 (×4): qty 2

## 2016-03-23 MED ORDER — SODIUM CHLORIDE 0.9 % IV SOLN
Freq: Once | INTRAVENOUS | Status: AC
  Administered 2016-03-23: 12:00:00 via INTRAVENOUS

## 2016-03-23 MED ORDER — GABAPENTIN 300 MG PO CAPS
300.0000 mg | ORAL_CAPSULE | Freq: Every day | ORAL | Status: DC
  Administered 2016-03-24 – 2016-03-27 (×4): 300 mg via ORAL
  Filled 2016-03-23 (×4): qty 1

## 2016-03-23 MED ORDER — DEXTROSE 5 % IV SOLN
1.0000 g | INTRAVENOUS | Status: DC
  Administered 2016-03-24: 1 g via INTRAVENOUS
  Filled 2016-03-23 (×2): qty 10

## 2016-03-23 MED ORDER — ACETAMINOPHEN 650 MG RE SUPP: 650.0000 mg | Freq: Four times a day (QID) | RECTAL | Status: DC | PRN

## 2016-03-23 MED ORDER — DEXTROSE 5 % IV SOLN
INTRAVENOUS | Status: AC
  Filled 2016-03-23: qty 10

## 2016-03-23 MED ORDER — DEXTROSE 5 % IV SOLN
500.0000 mg | Freq: Once | INTRAVENOUS | Status: AC
  Administered 2016-03-23: 500 mg via INTRAVENOUS
  Filled 2016-03-23: qty 500

## 2016-03-23 MED ORDER — ACETAMINOPHEN 325 MG PO TABS: 650.0000 mg | ORAL_TABLET | Freq: Once | ORAL | Status: DC | PRN

## 2016-03-23 MED ORDER — ENOXAPARIN SODIUM 40 MG/0.4ML ~~LOC~~ SOLN
40.0000 mg | SUBCUTANEOUS | Status: DC
  Administered 2016-03-23 – 2016-03-26 (×4): 40 mg via SUBCUTANEOUS
  Filled 2016-03-23 (×3): qty 0.4

## 2016-03-23 MED ORDER — DEXTROSE 5 % IV SOLN
1.0000 g | Freq: Once | INTRAVENOUS | Status: AC
  Administered 2016-03-23: 1 g via INTRAVENOUS
  Filled 2016-03-23: qty 10

## 2016-03-23 NOTE — ED Notes (Signed)
CRITICAL VALUE ALERT  Critical value received:  Lactic Acid 2.1  Date of notification:  03/23/16  Time of notification:  1200  Critical value read back:Yes.    Nurse who received alert:  Viviano SimasLauren Erika Slaby, RN  MD notified (1st page):  Jodi MourningZavitz

## 2016-03-23 NOTE — ED Notes (Signed)
ED Provider at bedside. 

## 2016-03-23 NOTE — ED Triage Notes (Signed)
Weakness ,  Fever times 2 days.  Pt not responding well this morning per grandson.   Strong smelling urine .

## 2016-03-23 NOTE — H&P (Signed)
History and Physical    Jennifer [name]:[id number] DOB: [birth year] DOA: 03/23/2016  PCP: Jennifer Overman, MD  Patient coming from: Home  Chief Complaint: urinary incontinence  HPI: [name] is a 83 y.o. female with medical history significant of CAD, CVA, COPD, and occasional urinary incontinence brought in by family for concerns of increase in her urinary incontinence and fever at home.  For the past few days patient has had more episodes of urinary incontinence than normal.  She has slight dementia at baseline and therefore family could not get a good assessment of if she was hurting or not.  No sick contacts, no recent travel.  Patient ambulates with a walker and is self sufficient for ADL's.  No increased work of breathing, but a cough that nonproductive. Patient reports rhinorrhea.  No diarrhea, abdominal pain, chest pain.   ED Course: patient vital signs all WNL, lactic slightly elevated at 2.1, CXR concerning for pneumonia or aspiration.  UA currently pending.  Review of Systems: As per HPI otherwise 10 point review of systems negative.    Past Medical History:  Diagnosis Date  . Anemia    iron deficiency post op.  Marland Kitchen CAD (coronary artery disease) 2008   Cath 40% mid LAD, 35% RCA  . Cellulitis of finger    right   . Chest pain 2006  . Chronic back pain   . COPD (chronic obstructive pulmonary disease) (HCC)   . Gastroesophageal reflux disease   . Hiatal hernia   . HTN (hypertension) 04/22/2014  . Hyperlipidemia   . Nicotine addiction   . Osteoarthritis    status post left TKA; surgery on the right is anticipated in the near future   . Pulmonary nodules    stable since 2006  . Shingles    right breast   . Skin infection   . Ulcer (HCC)    gential  . Use of cane as ambulatory aid     Past Surgical History:  Procedure Laterality Date  . ABDOMINAL HYSTERECTOMY    . BREAST SURGERY N/A    bilateral  . CARDIAC CATHETERIZATION  2008   Mid LAD 40%, RCA 25%.     . CHOLECYSTECTOMY    . COMBINED HYSTERECTOMY ABDOMINAL W/ A&P REPAIR / OOPHORECTOMY  approx. 40 years ago   . EYE SURGERY    . MASTECTOMY  1998 & 2007    right -1998 / left 2007  . right knee replacement  09/2009   Dr. Romeo Apple  . TOTAL KNEE ARTHROPLASTY  4/08   left      reports that she quit smoking about a year ago. Her smoking use included Cigarettes. She has a 17.50 pack-year smoking history. She has never used smokeless tobacco. She reports that she does not drink alcohol or use drugs.  Allergies  Allergen Reactions  . Iohexol      Desc: PT ALLERGIC TO CONTRAST- SHE CAN'T BREATH   . Iron   . Penicillins     Has patient had a PCN reaction causing immediate rash, facial/tongue/throat swelling, SOB or lightheadedness with hypotension:  unknown Has patient had a PCN reaction causing severe rash involving mucus membranes or skin necrosis: unknown Has patient had a PCN reaction that required hospitalization unknown Has patient had a PCN reaction occurring within the last 10 years: unknown If all of the above answers are "NO", then may proceed with Cephalosporin use.     Family History  Problem Relation Age of Onset  .  Cancer Mother 56    breast   . Cancer Father 36    prostate  . Hypertension Father   . Hypertension Sister   . Heart disease Sister   . Heart disease Sister   . Heart disease Sister      Prior to Admission medications   Medication Sig Start Date End Date Taking? Authorizing Provider  acetaminophen (TYLENOL) 500 MG tablet Take 1,000 mg by mouth 2 (two) times daily. *May take an additional dose if needed for pain    Historical Provider, MD  albuterol (PROVENTIL HFA;VENTOLIN HFA) 108 (90 BASE) MCG/ACT inhaler Inhale 2 puffs into the lungs every 6 (six) hours as needed for wheezing or shortness of breath. 08/27/14   [name], MD  celecoxib (CELEBREX) 100 MG capsule TAKE 1 CAPSULE(100 MG) BY MOUTH DAILY 12/18/15   [name], MD  cephALEXin  (KEFLEX) 500 MG capsule 2 caps po bid x 7 days 10/20/15   Marily Memos, MD  donepezil (ARICEPT) 5 MG tablet TAKE 1 TABLET(5 MG) BY MOUTH AT BEDTIME 12/19/15   [name], MD  gabapentin (NEURONTIN) 300 MG capsule TAKE 1 CAPSULE BY MOUTH EVERY MORNING THEN TAKE 2 CAPSULES BY MOUTH EVERY NIGHT AT BEDTIME 12/11/14   [name], MD  gabapentin (NEURONTIN) 300 MG capsule TAKE 1 CAPSULE BY MOUTH EVERY MORNING THEN TAKE 2 CAPSULES BY MOUTH EVERY NIGHT AT BEDTIME 12/10/15   [name], MD  lidocaine (LIDODERM) 5 % Place 3 patches onto the skin daily. Remove & Discard patch within 12 hours or as directed by MD. *Patient states that these are applied to wrist, leg, and back* Patient taking differently: Place 3 patches onto the skin daily as needed (for pain). Remove & Discard patch within 12 hours or as directed by MD. *Patient states that these are applied to wrist, leg, and back* 12/13/13   [name], MD  Multiple Vitamin (MULTIVITAMIN WITH MINERALS) TABS tablet Take 1 tablet by mouth daily.    Historical Provider, MD  ondansetron (ZOFRAN) 4 MG tablet Take 1 tablet (4 mg total) by mouth every 8 (eight) hours as needed for nausea or vomiting. 10/20/15   Marily Memos, MD  traMADol Janean Sark) 50 MG tablet One tablet twice daily 06/28/15   [name], MD    Physical Exam: Vitals:   03/23/16 1031 03/23/16 1200 03/23/16 1300 03/23/16 1315  BP:  133/66 140/70   Pulse:  97 93 91  Resp:  (!) 28 25 24   Temp:      TempSrc:      SpO2:  94% 94% 94%  Weight: 97.5 kg (215 lb)     Height: 5\' 4"  (1.626 m)         Constitutional: NAD, calm, comfortable, elderly female Vitals:   03/23/16 1031 03/23/16 1200 03/23/16 1300 03/23/16 1315  BP:  133/66 140/70   Pulse:  97 93 91  Resp:  (!) 28 25 24   Temp:      TempSrc:      SpO2:  94% 94% 94%  Weight: 97.5 kg (215 lb)     Height: 5\' 4"  (1.626 m)      Eyes: PERRL, lids and conjunctivae normal ENMT: Mucous membranes are moist.  Posterior pharynx clear of any exudate or lesions.Normal dentition.  Neck: normal, supple, no masses, no thyromegaly Respiratory: right lower lung rales, upper airway noise appreciated, no wheezing, no crackles. Normal respiratory effort. No accessory muscle use.  Cardiovascular: Regular rate and  rhythm, I/VI systolic murmur in the aortic area, no rubs / gallops. No extremity edema. 2+ pedal pulses. No carotid bruits.  Abdomen: no tenderness, no masses palpated. No hepatosplenomegaly. Bowel sounds positive.  Musculoskeletal: no clubbing / cyanosis. No joint deformity upper and lower extremities. Good ROM, no contractures. Normal muscle tone.  Skin: no rashes, lesions, ulcers. No induration Neurologic: CN 2-12 grossly intact but patient very hard of hearing, Sensation intact, DTR normal. Strength 5/5 in all 4.  Psychiatric: Normal judgment and insight. Alert and oriented to self, place. Normal mood.     Labs on Admission: I have personally reviewed following labs and imaging studies  CBC:  Recent Labs Lab 03/23/16 1111  WBC 13.5*  NEUTROABS 11.4*  HGB 12.9  HCT 40.7  MCV 96.0  PLT 188   Basic Metabolic Panel:  Recent Labs Lab 03/23/16 1058  NA 141  K 3.5  CL 104  CO2 24  GLUCOSE 142*  BUN 18  CREATININE 0.96  CALCIUM 8.9   GFR: Estimated Creatinine Clearance: 43.3 mL/min (by C-G formula based on SCr of 0.96 mg/dL). Liver Function Tests:  Recent Labs Lab 03/23/16 1058  AST 69*  ALT 45  ALKPHOS 141*  BILITOT 1.2  PROT 8.0  ALBUMIN 3.6   No results for input(s): LIPASE, AMYLASE in the last 168 hours. No results for input(s): AMMONIA in the last 168 hours. Coagulation Profile: No results for input(s): INR, PROTIME in the last 168 hours. Cardiac Enzymes: No results for input(s): CKTOTAL, CKMB, CKMBINDEX, TROPONINI in the last 168 hours. BNP (last 3 results) No results for input(s): PROBNP in the last 8760 hours. HbA1C: No results for input(s): HGBA1C in the  last 72 hours. CBG: No results for input(s): GLUCAP in the last 168 hours. Lipid Profile: No results for input(s): CHOL, HDL, LDLCALC, TRIG, CHOLHDL, LDLDIRECT in the last 72 hours. Thyroid Function Tests: No results for input(s): TSH, T4TOTAL, FREET4, T3FREE, THYROIDAB in the last 72 hours. Anemia Panel: No results for input(s): VITAMINB12, FOLATE, FERRITIN, TIBC, IRON, RETICCTPCT in the last 72 hours. Urine analysis:    Component Value Date/Time   COLORURINE BROWN (A) 03/23/2016 1218   APPEARANCEUR CLOUDY (A) 03/23/2016 1218   LABSPEC >1.030 (H) 03/23/2016 1218   PHURINE 6.0 03/23/2016 1218   GLUCOSEU NEGATIVE 03/23/2016 1218   GLUCOSEU NEG mg/dL 16/10/9602 5409   HGBUR LARGE (A) 03/23/2016 1218   HGBUR negative 05/02/2008 1547   BILIRUBINUR MODERATE (A) 03/23/2016 1218   BILIRUBINUR small 12/05/2012 1324   KETONESUR 15 (A) 03/23/2016 1218   PROTEINUR >300 (A) 03/23/2016 1218   UROBILINOGEN 1.0 12/05/2012 1324   UROBILINOGEN 0.2 05/02/2008 1547   NITRITE POSITIVE (A) 03/23/2016 1218   LEUKOCYTESUR TRACE (A) 03/23/2016 1218   Sepsis Labs: !!!!!!!!!!!!!!!!!!!!!!!!!!!!!!!!!!!!!!!!!!!! @LABRCNTIP (procalcitonin:4,lacticidven:4) ) Recent Results (from the past 240 hour(s))  Blood culture (routine x 2)     Status: None (Preliminary result)   Collection Time: 03/23/16 10:58 AM  Result Value Ref Range Status   Specimen Description BLOOD RIGHT HAND  Final   Special Requests BOTTLES DRAWN AEROBIC AND ANAEROBIC [facility] EACH  Final   Culture PENDING  Incomplete   Report Status PENDING  Incomplete  Blood culture (routine x 2)     Status: None (Preliminary result)   Collection Time: 03/23/16 11:05 AM  Result Value Ref Range Status   Specimen Description LEFT ANTECUBITAL  Final   Special Requests BOTTLES DRAWN AEROBIC AND ANAEROBIC [facility] EACH  Final   Culture PENDING  Incomplete  Report Status PENDING  Incomplete     Radiological Exams on Admission: Dg Chest 2 View  Result Date:  03/23/2016 CLINICAL DATA:  Fever for 2 days. Weakness. Altered mental status this morning. EXAM: CHEST  2 VIEW COMPARISON:  Single-view of the chest 04/22/2014 and PA and lateral chest 01/20/2013. FINDINGS: There is airspace disease in the periphery of the right lower lobe. Left lung appears clear. Heart size is normal. Surgical clips left axilla are noted. IMPRESSION: Right lower lobe airspace disease worrisome for pneumonia or aspiration. Electronically Signed   By: Drusilla Kannerhomas  Dalessio M.D.   On: 03/23/2016 11:17    EKG: not done  Assessment/Plan Principal Problem:   Pneumonia Active Problems:   Coronary atherosclerosis   Osteoarthritis   COPD (chronic obstructive pulmonary disease) (HCC)   Urinary incontinence    Pneumonia - given Ceftriaxone and Azithromycin in ED - continue these every 24 hours - oxygen therapy as needed - blood cultures x 2 - sputum culture ordered  COPD - will order albuterol nebulizers  CAD - not on medication at home  Osteoarthritis - continue lidocaine patches  Spinal Stenosis - continue gabapentin - hold tramadol for now  Urinary incontinence - UA pending - f/u on these results - urine culture pending   DVT prophylaxis: lovenox Code Status: DNR (family has DNR paperwork)  Family Communication: granddaughter in law bedside Disposition Plan: plan to discharge within 24-48 hours pending improved  Consults called: none Admission status: Obs to med surg    Katrinka BlazingAlex U Kadolph MD [facility] Pager [phone]  If 7PM-7AM, please contact night-coverage [email or web address] Password [email or web address]  03/23/2016, 1:31 PM

## 2016-03-23 NOTE — ED Provider Notes (Signed)
[facility]-EMERGENCY DEPT Provider Note   CSN: [id number] Arrival date & time: 03/23/16  1022     History   Chief Complaint Chief Complaint  Patient presents with  . Altered Mental Status    HPI Jennifer [name] is a 83 y.o. female.  Patient presents from home with history of UTI, anemia, chronic back pain, nicotine addiction, COPD presents with fever and general weakness with urinary incontinence since yesterday. Primary caregivers out of town however grandchildren describe that she has had UTIs in the past. Decreased energy. Nothing improves her symptoms. No significant sick contacts.      Past Medical History:  Diagnosis Date  . Anemia    iron deficiency post op.  Marland Kitchen CAD (coronary artery disease) 2008   Cath 40% mid LAD, 35% RCA  . Cellulitis of finger    right   . Chest pain 2006  . Chronic back pain   . COPD (chronic obstructive pulmonary disease) (HCC)   . Gastroesophageal reflux disease   . Hiatal hernia   . HTN (hypertension) 04/22/2014  . Hyperlipidemia   . Nicotine addiction   . Osteoarthritis    status post left TKA; surgery on the right is anticipated in the near future   . Pulmonary nodules    stable since 2006  . Shingles    right breast   . Skin infection   . Ulcer (HCC)    gential  . Use of cane as ambulatory aid     Patient Active Problem List   Diagnosis Date Noted  . Medicare annual wellness visit, subsequent 06/13/2015  . DNR no code (do not resuscitate) 04/25/2014  . Urinary incontinence 04/04/2013  . Osteoporosis, post-menopausal 12/06/2012  . At high risk for falls 07/30/2012  . MCI (mild cognitive impairment) 08/19/2011  . COPD (chronic obstructive pulmonary disease) (HCC) 06/25/2011  . CEREBROVASCULAR DISEASE 08/05/2009  . SPINAL STENOSIS 06/09/2007  . Coronary atherosclerosis 01/25/2007  . HIATAL HERNIA WITH REFLUX 01/25/2007  . Osteoarthritis 01/25/2007    Past Surgical History:  Procedure Laterality Date  . ABDOMINAL  HYSTERECTOMY    . BREAST SURGERY N/A    bilateral  . CARDIAC CATHETERIZATION  2008   Mid LAD 40%, RCA 25%.   . CHOLECYSTECTOMY    . COMBINED HYSTERECTOMY ABDOMINAL W/ A&P REPAIR / OOPHORECTOMY  approx. 40 years ago   . EYE SURGERY    . MASTECTOMY  1998 & 2007    right -1998 / left 2007  . right knee replacement  09/2009   Dr. Romeo Apple  . TOTAL KNEE ARTHROPLASTY  4/08   left     OB History    Gravida Para Term Preterm AB Living             1   SAB TAB Ectopic Multiple Live Births                   Home Medications    Prior to Admission medications   Medication Sig Start Date End Date Taking? Authorizing Provider  acetaminophen (TYLENOL) 500 MG tablet Take 1,000 mg by mouth 2 (two) times daily. *May take an additional dose if needed for pain    Historical Provider, MD  albuterol (PROVENTIL HFA;VENTOLIN HFA) 108 (90 BASE) MCG/ACT inhaler Inhale 2 puffs into the lungs every 6 (six) hours as needed for wheezing or shortness of breath. 08/27/14   [name], MD  celecoxib (CELEBREX) 100 MG capsule TAKE 1 CAPSULE(100 MG) BY MOUTH DAILY 12/18/15  [name], MD  cephALEXin (KEFLEX) 500 MG capsule 2 caps po bid x 7 days 10/20/15   Marily Memos, MD  donepezil (ARICEPT) 5 MG tablet TAKE 1 TABLET(5 MG) BY MOUTH AT BEDTIME 12/19/15   [name], MD  gabapentin (NEURONTIN) 300 MG capsule TAKE 1 CAPSULE BY MOUTH EVERY MORNING THEN TAKE 2 CAPSULES BY MOUTH EVERY NIGHT AT BEDTIME 12/11/14   [name], MD  gabapentin (NEURONTIN) 300 MG capsule TAKE 1 CAPSULE BY MOUTH EVERY MORNING THEN TAKE 2 CAPSULES BY MOUTH EVERY NIGHT AT BEDTIME 12/10/15   [name], MD  lidocaine (LIDODERM) 5 % Place 3 patches onto the skin daily. Remove & Discard patch within 12 hours or as directed by MD. *Patient states that these are applied to wrist, leg, and back* Patient taking differently: Place 3 patches onto the skin daily as needed (for pain). Remove & Discard patch within 12  hours or as directed by MD. *Patient states that these are applied to wrist, leg, and back* 12/13/13   [name], MD  Multiple Vitamin (MULTIVITAMIN WITH MINERALS) TABS tablet Take 1 tablet by mouth daily.    Historical Provider, MD  ondansetron (ZOFRAN) 4 MG tablet Take 1 tablet (4 mg total) by mouth every 8 (eight) hours as needed for nausea or vomiting. 10/20/15   Marily Memos, MD  traMADol Janean Sark) 50 MG tablet One tablet twice daily 06/28/15   [name], MD    Family History Family History  Problem Relation Age of Onset  . Cancer Mother 56    breast   . Cancer Father 18    prostate  . Hypertension Father   . Hypertension Sister   . Heart disease Sister   . Heart disease Sister   . Heart disease Sister     Social History Social History  Substance Use Topics  . Smoking status: Former Smoker    Packs/day: 0.25    Years: 70.00    Types: Cigarettes    Quit date: 03/13/2015  . Smokeless tobacco: Never Used  . Alcohol use No     Allergies   Iohexol; Iron; and Penicillins   Review of Systems Review of Systems  Constitutional: Positive for fever. Negative for chills.  HENT: Negative for ear pain and sore throat.   Eyes: Negative for pain and visual disturbance.  Respiratory: Positive for cough. Negative for shortness of breath.   Cardiovascular: Negative for chest pain and palpitations.  Gastrointestinal: Negative for abdominal pain and vomiting.  Genitourinary: Negative for dysuria and hematuria.  Musculoskeletal: Negative for arthralgias and back pain.  Skin: Negative for color change and rash.  Neurological: Positive for weakness. Negative for seizures and syncope.  All other systems reviewed and are negative.    Physical Exam Updated Vital Signs BP 133/66   Pulse 97   Temp 100.3 F (37.9 C) (Oral)   Resp (!) 28   Ht 5\' 4"  (1.626 m)   Wt 215 lb (97.5 kg)   SpO2 94%   BMI 36.90 kg/m   Physical Exam  Constitutional: She is oriented to  person, place, and time. She appears well-developed and well-nourished.  HENT:  Head: Normocephalic and atraumatic.  Dry mucous membranes  Eyes: Right eye exhibits no discharge. Left eye exhibits no discharge.  Neck: Normal range of motion. Neck supple. No tracheal deviation present.  Cardiovascular: Normal rate and regular rhythm.   Pulmonary/Chest: Effort normal. She has rales (few rales left base).  Abdominal: Soft.  She exhibits no distension. There is no tenderness. There is no guarding.  Musculoskeletal:  Gen. weakness and deconditioning. Patient moves all extremities equal bilateral. Patient answers questions however difficulty hearing.  Neurological: She is alert and oriented to person, place, and time. No cranial nerve deficit.  Skin: Skin is warm. No rash noted.  Psychiatric: She has a normal mood and affect.  Nursing note and vitals reviewed.    ED Treatments / Results  Labs (all labs ordered are listed, but only abnormal results are displayed) Labs Reviewed  COMPREHENSIVE METABOLIC PANEL - Abnormal; Notable for the following:       Result Value   Glucose, Bld 142 (*)    AST 69 (*)    Alkaline Phosphatase 141 (*)    GFR calc non Af Amer 50 (*)    GFR calc Af Amer 58 (*)    All other components within normal limits  CBC WITH DIFFERENTIAL/PLATELET - Abnormal; Notable for the following:    WBC 13.5 (*)    Neutro Abs 11.4 (*)    Monocytes Absolute 1.4 (*)    All other components within normal limits  LACTIC ACID, PLASMA - Abnormal; Notable for the following:    Lactic Acid, Venous 2.1 (*)    All other components within normal limits  CULTURE, BLOOD (ROUTINE X 2)  CULTURE, BLOOD (ROUTINE X 2)  URINE CULTURE  URINALYSIS, ROUTINE W REFLEX MICROSCOPIC  LACTIC ACID, PLASMA    EKG  EKG Interpretation None       Radiology Dg Chest 2 View  Result Date: 03/23/2016 CLINICAL DATA:  Fever for 2 days. Weakness. Altered mental status this morning. EXAM: CHEST  2 VIEW  COMPARISON:  Single-view of the chest 04/22/2014 and PA and lateral chest 01/20/2013. FINDINGS: There is airspace disease in the periphery of the right lower lobe. Left lung appears clear. Heart size is normal. Surgical clips left axilla are noted. IMPRESSION: Right lower lobe airspace disease worrisome for pneumonia or aspiration. Electronically Signed   By: Drusilla Kannerhomas  Dalessio M.D.   On: 03/23/2016 11:17    Procedures Procedures (including critical care time)  Medications Ordered in ED Medications  cefTRIAXone (ROCEPHIN) 1 g in dextrose 5 % 50 mL IVPB (not administered)  azithromycin (ZITHROMAX) 500 mg in dextrose 5 % 250 mL IVPB (not administered)  0.9 %  sodium chloride infusion ( Intravenous New Bag/Given 03/23/16 1140)     Initial Impression / Assessment and Plan / ED Course  I have reviewed the triage vital signs and the nursing notes.  Pertinent labs & imaging results that were available during my care of the patient were reviewed by me and considered in my medical decision making (see chart for details).    Patient resents with fever cough and urinary symptoms. Sepsis screen initiated. Blood pressure on arrival unremarkable. Sepsis order set utilized normal saline infusion and cultures obtained. Concern clinically for urine infection however patient also does have a cough.  The patients results and plan were reviewed and discussed.   Any x-rays performed were independently reviewed by myself.   Differential diagnosis were considered with the presenting HPI.  Medications  cefTRIAXone (ROCEPHIN) 1 g in dextrose 5 % 50 mL IVPB (not administered)  azithromycin (ZITHROMAX) 500 mg in dextrose 5 % 250 mL IVPB (not administered)  0.9 %  sodium chloride infusion ( Intravenous New Bag/Given 03/23/16 1140)    Vitals:   03/23/16 1030 03/23/16 1031 03/23/16 1200  BP: 161/74  133/66  Pulse: 112  97  Resp: 18  (!) 28  Temp: 100.3 F (37.9 C)    TempSrc: Oral    SpO2: 96%  94%  Weight:   215 lb (97.5 kg)   Height:  5\' 4"  (1.626 m)     Final diagnoses:  Fever chills  Community acquired pneumonia of left lower lobe of lung (HCC)    Admission/ observation were discussed with the admitting physician, patient and/or family and they are comfortable with the plan.    Final Clinical Impressions(s) / ED Diagnoses   Final diagnoses:  Fever chills  Community acquired pneumonia of left lower lobe of lung (HCC)    New Prescriptions New Prescriptions   No medications on file     Blane Ohara, MD 03/23/16 1244

## 2016-03-24 ENCOUNTER — Inpatient Hospital Stay (HOSPITAL_COMMUNITY): Payer: Medicare Other

## 2016-03-24 DIAGNOSIS — N39498 Other specified urinary incontinence: Secondary | ICD-10-CM | POA: Diagnosis not present

## 2016-03-24 DIAGNOSIS — J440 Chronic obstructive pulmonary disease with acute lower respiratory infection: Secondary | ICD-10-CM | POA: Diagnosis present

## 2016-03-24 DIAGNOSIS — N390 Urinary tract infection, site not specified: Secondary | ICD-10-CM | POA: Diagnosis present

## 2016-03-24 DIAGNOSIS — R278 Other lack of coordination: Secondary | ICD-10-CM | POA: Diagnosis not present

## 2016-03-24 DIAGNOSIS — M6281 Muscle weakness (generalized): Secondary | ICD-10-CM | POA: Diagnosis not present

## 2016-03-24 DIAGNOSIS — M810 Age-related osteoporosis without current pathological fracture: Secondary | ICD-10-CM | POA: Diagnosis not present

## 2016-03-24 DIAGNOSIS — B9620 Unspecified Escherichia coli [E. coli] as the cause of diseases classified elsewhere: Secondary | ICD-10-CM | POA: Diagnosis not present

## 2016-03-24 DIAGNOSIS — J9601 Acute respiratory failure with hypoxia: Secondary | ICD-10-CM | POA: Diagnosis not present

## 2016-03-24 DIAGNOSIS — J181 Lobar pneumonia, unspecified organism: Secondary | ICD-10-CM | POA: Diagnosis not present

## 2016-03-24 DIAGNOSIS — Z803 Family history of malignant neoplasm of breast: Secondary | ICD-10-CM | POA: Diagnosis not present

## 2016-03-24 DIAGNOSIS — R32 Unspecified urinary incontinence: Secondary | ICD-10-CM | POA: Diagnosis present

## 2016-03-24 DIAGNOSIS — M158 Other polyosteoarthritis: Secondary | ICD-10-CM | POA: Diagnosis not present

## 2016-03-24 DIAGNOSIS — Z91041 Radiographic dye allergy status: Secondary | ICD-10-CM | POA: Diagnosis not present

## 2016-03-24 DIAGNOSIS — Z9013 Acquired absence of bilateral breasts and nipples: Secondary | ICD-10-CM | POA: Diagnosis not present

## 2016-03-24 DIAGNOSIS — Z96653 Presence of artificial knee joint, bilateral: Secondary | ICD-10-CM | POA: Diagnosis not present

## 2016-03-24 DIAGNOSIS — Z66 Do not resuscitate: Secondary | ICD-10-CM | POA: Diagnosis present

## 2016-03-24 DIAGNOSIS — D509 Iron deficiency anemia, unspecified: Secondary | ICD-10-CM | POA: Diagnosis not present

## 2016-03-24 DIAGNOSIS — J9691 Respiratory failure, unspecified with hypoxia: Secondary | ICD-10-CM | POA: Diagnosis not present

## 2016-03-24 DIAGNOSIS — G3184 Mild cognitive impairment, so stated: Secondary | ICD-10-CM | POA: Diagnosis not present

## 2016-03-24 DIAGNOSIS — K219 Gastro-esophageal reflux disease without esophagitis: Secondary | ICD-10-CM | POA: Diagnosis not present

## 2016-03-24 DIAGNOSIS — Z87440 Personal history of urinary (tract) infections: Secondary | ICD-10-CM | POA: Diagnosis not present

## 2016-03-24 DIAGNOSIS — A419 Sepsis, unspecified organism: Secondary | ICD-10-CM | POA: Diagnosis present

## 2016-03-24 DIAGNOSIS — I10 Essential (primary) hypertension: Secondary | ICD-10-CM | POA: Diagnosis not present

## 2016-03-24 DIAGNOSIS — R509 Fever, unspecified: Secondary | ICD-10-CM

## 2016-03-24 DIAGNOSIS — R293 Abnormal posture: Secondary | ICD-10-CM | POA: Diagnosis not present

## 2016-03-24 DIAGNOSIS — Z8249 Family history of ischemic heart disease and other diseases of the circulatory system: Secondary | ICD-10-CM | POA: Diagnosis not present

## 2016-03-24 DIAGNOSIS — Z87891 Personal history of nicotine dependence: Secondary | ICD-10-CM | POA: Diagnosis not present

## 2016-03-24 DIAGNOSIS — M4800 Spinal stenosis, site unspecified: Secondary | ICD-10-CM

## 2016-03-24 DIAGNOSIS — R1312 Dysphagia, oropharyngeal phase: Secondary | ICD-10-CM | POA: Diagnosis not present

## 2016-03-24 DIAGNOSIS — R1310 Dysphagia, unspecified: Secondary | ICD-10-CM | POA: Diagnosis present

## 2016-03-24 DIAGNOSIS — M545 Low back pain: Secondary | ICD-10-CM | POA: Diagnosis not present

## 2016-03-24 DIAGNOSIS — Z8673 Personal history of transient ischemic attack (TIA), and cerebral infarction without residual deficits: Secondary | ICD-10-CM | POA: Diagnosis not present

## 2016-03-24 DIAGNOSIS — I2510 Atherosclerotic heart disease of native coronary artery without angina pectoris: Secondary | ICD-10-CM | POA: Diagnosis present

## 2016-03-24 DIAGNOSIS — F0390 Unspecified dementia without behavioral disturbance: Secondary | ICD-10-CM | POA: Diagnosis present

## 2016-03-24 DIAGNOSIS — J189 Pneumonia, unspecified organism: Secondary | ICD-10-CM | POA: Diagnosis not present

## 2016-03-24 DIAGNOSIS — Z8042 Family history of malignant neoplasm of prostate: Secondary | ICD-10-CM | POA: Diagnosis not present

## 2016-03-24 DIAGNOSIS — E785 Hyperlipidemia, unspecified: Secondary | ICD-10-CM | POA: Diagnosis not present

## 2016-03-24 DIAGNOSIS — I679 Cerebrovascular disease, unspecified: Secondary | ICD-10-CM | POA: Diagnosis not present

## 2016-03-24 DIAGNOSIS — M1990 Unspecified osteoarthritis, unspecified site: Secondary | ICD-10-CM | POA: Diagnosis present

## 2016-03-24 DIAGNOSIS — Z96651 Presence of right artificial knee joint: Secondary | ICD-10-CM | POA: Diagnosis present

## 2016-03-24 DIAGNOSIS — K449 Diaphragmatic hernia without obstruction or gangrene: Secondary | ICD-10-CM | POA: Diagnosis not present

## 2016-03-24 DIAGNOSIS — J449 Chronic obstructive pulmonary disease, unspecified: Secondary | ICD-10-CM | POA: Diagnosis not present

## 2016-03-24 LAB — BLOOD CULTURE ID PANEL (REFLEXED)
ACINETOBACTER BAUMANNII: NOT DETECTED
CANDIDA ALBICANS: NOT DETECTED
CANDIDA PARAPSILOSIS: NOT DETECTED
CANDIDA TROPICALIS: NOT DETECTED
Candida glabrata: NOT DETECTED
Candida krusei: NOT DETECTED
ENTEROBACTERIACEAE SPECIES: NOT DETECTED
Enterobacter cloacae complex: NOT DETECTED
Enterococcus species: NOT DETECTED
Escherichia coli: NOT DETECTED
HAEMOPHILUS INFLUENZAE: NOT DETECTED
KLEBSIELLA OXYTOCA: NOT DETECTED
KLEBSIELLA PNEUMONIAE: NOT DETECTED
Listeria monocytogenes: NOT DETECTED
METHICILLIN RESISTANCE: DETECTED — AB
NEISSERIA MENINGITIDIS: NOT DETECTED
PSEUDOMONAS AERUGINOSA: NOT DETECTED
Proteus species: NOT DETECTED
SERRATIA MARCESCENS: NOT DETECTED
STAPHYLOCOCCUS AUREUS BCID: NOT DETECTED
STREPTOCOCCUS SPECIES: NOT DETECTED
Staphylococcus species: DETECTED — AB
Streptococcus agalactiae: NOT DETECTED
Streptococcus pneumoniae: NOT DETECTED
Streptococcus pyogenes: NOT DETECTED

## 2016-03-24 LAB — HIV ANTIBODY (ROUTINE TESTING W REFLEX): HIV SCREEN 4TH GENERATION: NONREACTIVE

## 2016-03-24 MED ORDER — CEFUROXIME AXETIL 250 MG PO TABS
500.0000 mg | ORAL_TABLET | Freq: Two times a day (BID) | ORAL | Status: DC
  Administered 2016-03-25: 500 mg via ORAL
  Filled 2016-03-24: qty 2

## 2016-03-24 MED ORDER — VANCOMYCIN HCL 10 G IV SOLR
1250.0000 mg | INTRAVENOUS | Status: DC
  Filled 2016-03-24: qty 1250

## 2016-03-24 MED ORDER — VANCOMYCIN HCL IN DEXTROSE 1-5 GM/200ML-% IV SOLN
1000.0000 mg | Freq: Once | INTRAVENOUS | Status: AC
  Administered 2016-03-24: 1000 mg via INTRAVENOUS
  Filled 2016-03-24: qty 200

## 2016-03-24 NOTE — Progress Notes (Signed)
ANTIBIOTIC CONSULT NOTE-Preliminary  Pharmacy Consult for vancomycin Indication: bacteremia  Allergies  Allergen Reactions  . Iohexol      Desc: PT ALLERGIC TO CONTRAST- SHE CAN'T BREATH   . Iron   . Penicillins     Has patient had a PCN reaction causing immediate rash, facial/tongue/throat swelling, SOB or lightheadedness with hypotension:  unknown Has patient had a PCN reaction causing severe rash involving mucus membranes or skin necrosis: unknown Has patient had a PCN reaction that required hospitalization unknown Has patient had a PCN reaction occurring within the last 10 years: unknown If all of the above answers are "NO", then may proceed with Cephalosporin use.     Patient Measurements: Height: 5\' 4"  (162.6 cm) Weight: 215 lb (97.5 kg) IBW/kg (Calculated) : 54.7 Adjusted Body Weight:   Vital Signs: Temp: 98.8 F (37.1 C) (03/13 0452) Temp Source: Oral (03/13 0452) BP: 131/50 (03/13 0452) Pulse Rate: 71 (03/13 0452)  Labs:  Recent Labs  03/23/16 1058 03/23/16 1111  WBC  --  13.5*  HGB  --  12.9  PLT  --  188  CREATININE 0.96  --     Estimated Creatinine Clearance: 43.3 mL/min (by C-G formula based on SCr of 0.96 mg/dL).  No results for input(s): VANCOTROUGH, VANCOPEAK, VANCORANDOM, GENTTROUGH, GENTPEAK, GENTRANDOM, TOBRATROUGH, TOBRAPEAK, TOBRARND, AMIKACINPEAK, AMIKACINTROU, AMIKACIN in the last 72 hours.   Microbiology: Recent Results (from the past 720 hour(s))  Blood culture (routine x 2)     Status: None (Preliminary result)   Collection Time: 03/23/16 10:58 AM  Result Value Ref Range Status   Specimen Description BLOOD RIGHT HAND  Final   Special Requests BOTTLES DRAWN AEROBIC AND ANAEROBIC [facility] EACH  Final   Culture  Setup Time   Final    GRAM POSITIVE COCCI Gram Stain Report Called to,Read Back By and Verified With: GAVIN,B. AT 0513 ON 03/24/2016 BY EVA ANAEROBIC BOTTLE ONLY Performed at [facility]    Culture PENDING  Incomplete   Report Status PENDING  Incomplete  Blood culture (routine x 2)     Status: None (Preliminary result)   Collection Time: 03/23/16 11:05 AM  Result Value Ref Range Status   Specimen Description LEFT ANTECUBITAL  Final   Special Requests BOTTLES DRAWN AEROBIC AND ANAEROBIC [facility] EACH  Final   Culture PENDING  Incomplete   Report Status PENDING  Incomplete    Medical History: Past Medical History:  Diagnosis Date  . Anemia    iron deficiency post op.  Marland Kitchen. CAD (coronary artery disease) 2008   Cath 40% mid LAD, 35% RCA  . Cellulitis of finger    right   . Chest pain 2006  . Chronic back pain   . COPD (chronic obstructive pulmonary disease) (HCC)   . Gastroesophageal reflux disease   . Hiatal hernia   . HTN (hypertension) 04/22/2014  . Hyperlipidemia   . Nicotine addiction   . Osteoarthritis    status post left TKA; surgery on the right is anticipated in the near future   . Pulmonary nodules    stable since 2006  . Shingles    right breast   . Skin infection   . Ulcer (HCC)    gential  . Use of cane as ambulatory aid     Medications:  Scheduled:  . azithromycin  500 mg Oral Q24H  . cefTRIAXone (ROCEPHIN)  IV  1 g Intravenous Q24H  . donepezil  5 mg Oral QHS  . enoxaparin (LOVENOX) injection  40 mg Subcutaneous Q24H  . gabapentin  300 mg Oral Daily  . gabapentin  600 mg Oral QHS  . vancomycin  1,000 mg Intravenous Once   Infusions:   PRN: acetaminophen **OR** acetaminophen, albuterol Anti-infectives    Start     Dose/Rate Route Frequency Ordered Stop   03/24/16 1300  cefTRIAXone (ROCEPHIN) 1 g in dextrose 5 % 50 mL IVPB     1 g 100 mL/hr over 30 Minutes Intravenous Every 24 hours 03/23/16 1506 03/31/16 1259   03/24/16 1000  azithromycin (ZITHROMAX) tablet 500 mg     500 mg Oral Every 24 hours 03/23/16 1506 03/31/16 0959   03/24/16 0545  vancomycin (VANCOCIN) IVPB 1000 mg/200 mL premix     1,000 mg 200 mL/hr over 60 Minutes Intravenous  Once 03/24/16 0543     03/23/16  1245  cefTRIAXone (ROCEPHIN) 1 g in dextrose 5 % 50 mL IVPB     1 g 100 mL/hr over 30 Minutes Intravenous  Once 03/23/16 1232 03/23/16 1326   03/23/16 1245  azithromycin (ZITHROMAX) 500 mg in dextrose 5 % 250 mL IVPB     500 mg 250 mL/hr over 60 Minutes Intravenous  Once 03/23/16 1232 03/23/16 1426      Assessment: 83 yo female being treated for pneumonia. Blood culture has grown GPC, therefore adding vancomycin.   Goal of Therapy:  Vancomycin trough level 15-20 mcg/ml  Plan:  Preliminary review of pertinent patient information completed.  Protocol will be initiated with a one-time dose(s) of vancomycin 1 gram.  Jeani Hawking clinical pharmacist will complete review during morning rounds to assess patient and finalize treatment regimen.  Loyola Mast, RPH 03/24/2016,5:43 AM

## 2016-03-24 NOTE — Progress Notes (Addendum)
PROGRESS NOTE    Jennifer [name]  ZOX:[id number] DOB: 05/09/1922 DOA: 03/23/2016 PCP: Syliva OvermanMargaret Simpson, MD    Brief Narrative:  Jennifer [name] is a 83 y.o. female with medical history significant of CAD, CVA, COPD, and occasional urinary incontinence brought in by family for concerns of increase in her urinary incontinence and fever at home.  For the past few days patient has had more episodes of urinary incontinence than normal.  She has slight dementia at baseline and therefore family could not get a good assessment of if she was hurting or not.  No sick contacts, no recent travel.  Patient ambulates with a walker and is self sufficient for ADL's.  No increased work of breathing, but a cough that nonproductive. Patient reports rhinorrhea.  No diarrhea, abdominal pain, chest pain.    Assessment & Plan:   Principal Problem:   Pneumonia Active Problems:   Coronary atherosclerosis   Osteoarthritis   Spinal stenosis   COPD (chronic obstructive pulmonary disease) (HCC)   MCI (mild cognitive impairment)   Urinary incontinence  MRSA bacteremia - Pharmacy discussed with ID who believes this is contaminant - will discontinue vancomycin - will cancel Echocardiogram - attempted to call patient's family but daughter is likely traveling (she was at airport during time of previous call) - no further workup required  Pneumonia - given Ceftriaxone and Azithromycin in ED - continue these every 24 hours - oxygen therapy as needed - blood cultures showing MRSA (infectious disease reflex consultation) - sputum culture ordered - transition to PO antibiotics in am (ceftin ordered)   COPD - will order albuterol nebulizers  CAD - not on medication at home  Osteoarthritis - continue lidocaine patches  Spinal Stenosis - continue gabapentin - hold tramadol for now  Urinary incontinence - UA nitrite + - urine culture pending - additional antibiotics or change of antibiotics pending  culture   DVT prophylaxis: lovenox Code Status: DNR (family has DNR paperwork)  Family Communication: discussed with patient's daughter on phone- [place] and would like to talk to provider on 3/14 in person Disposition Plan: likely discharge in am  Consultants:   Cardiology  Procedures:   None  Antimicrobials:   Ceftriaxone  Azithromycin  Vancomycin    Subjective: Blood culture  positive for MRSA bacteremia.  Patient sitting in bed- comfortable.  No overnight events noted.  Objective: Vitals:   03/23/16 1504 03/23/16 1847 03/23/16 2100 03/24/16 0452  BP: (!) 143/61  (!) 142/63 (!) 131/50  Pulse: 93  92 71  Resp: 18  20 18   Temp: 100 F (37.8 C)  98.7 F (37.1 C) 98.8 F (37.1 C)  TempSrc: Oral  Oral Oral  SpO2: 97% 92% 95% 100%  Weight:      Height:        Intake/Output Summary (Last 24 hours) at 03/24/16 1036 Last data filed at 03/24/16 0700  Gross per 24 hour  Intake              540 ml  Output                0 ml  Net              540 ml   Filed Weights   03/23/16 1031  Weight: 97.5 kg (215 lb)    Examination:  General exam: Appears calm and comfortable  Respiratory system: Clear to auscultation. Respiratory effort normal. Cardiovascular system: S1 & S2 heard,  RRR. No JVD, murmurs, rubs, gallops or clicks. No pedal edema. Gastrointestinal system: Abdomen is nondistended, soft and nontender. No organomegaly or masses felt. Normal bowel sounds heard. Central nervous system: Alert and oriented. No focal neurological deficits. Extremities: Symmetric 5 x 5 power. Skin: No rashes, lesions or ulcers Psychiatry: Judgement and insight appear normal. Mood & affect appropriate.     Data Reviewed: I have personally reviewed following labs and imaging studies  CBC:  Recent Labs Lab 03/23/16 1111  WBC 13.5*  NEUTROABS 11.4*  HGB 12.9  HCT 40.7  MCV 96.0  PLT 188   Basic Metabolic Panel:  Recent Labs Lab  03/23/16 1058  NA 141  K 3.5  CL 104  CO2 24  GLUCOSE 142*  BUN 18  CREATININE 0.96  CALCIUM 8.9   GFR: Estimated Creatinine Clearance: 43.3 mL/min (by C-G formula based on SCr of 0.96 mg/dL). Liver Function Tests:  Recent Labs Lab 03/23/16 1058  AST 69*  ALT 45  ALKPHOS 141*  BILITOT 1.2  PROT 8.0  ALBUMIN 3.6   No results for input(s): LIPASE, AMYLASE in the last 168 hours. No results for input(s): AMMONIA in the last 168 hours. Coagulation Profile: No results for input(s): INR, PROTIME in the last 168 hours. Cardiac Enzymes: No results for input(s): CKTOTAL, CKMB, CKMBINDEX, TROPONINI in the last 168 hours. BNP (last 3 results) No results for input(s): PROBNP in the last 8760 hours. HbA1C: No results for input(s): HGBA1C in the last 72 hours. CBG: No results for input(s): GLUCAP in the last 168 hours. Lipid Profile: No results for input(s): CHOL, HDL, LDLCALC, TRIG, CHOLHDL, LDLDIRECT in the last 72 hours. Thyroid Function Tests: No results for input(s): TSH, T4TOTAL, FREET4, T3FREE, THYROIDAB in the last 72 hours. Anemia Panel: No results for input(s): VITAMINB12, FOLATE, FERRITIN, TIBC, IRON, RETICCTPCT in the last 72 hours. Sepsis Labs:  Recent Labs Lab 03/23/16 1111 03/23/16 1348  LATICACIDVEN 2.1* 1.8    Recent Results (from the past 240 hour(s))  Blood culture (routine x 2)     Status: Abnormal (Preliminary result)   Collection Time: 03/23/16 10:58 AM  Result Value Ref Range Status   Specimen Description BLOOD RIGHT HAND  Final   Special Requests BOTTLES DRAWN AEROBIC AND ANAEROBIC [facility] EACH  Final   Culture  Setup Time   Final    GRAM POSITIVE COCCI Gram Stain Report Called to,Read Back By and Verified With: GAVIN,B. AT 1610 ON 03/24/2016 BY EVA ANAEROBIC BOTTLE ONLY Performed at [facility] CRITICAL RESULT CALLED TO, READ BACK BY AND VERIFIED WITH: Mar Daring [id number] [phone] MLM Performed at [facility], [address],  [place], [place] [address]    Culture STAPHYLOCOCCUS SPECIES (COAGULASE NEGATIVE) (A)  Final   Report Status PENDING  Incomplete  Blood Culture ID Panel (Reflexed)     Status: Abnormal   Collection Time: 03/23/16 10:58 AM  Result Value Ref Range Status   Enterococcus species NOT DETECTED NOT DETECTED Final   Listeria monocytogenes NOT DETECTED NOT DETECTED Final   Staphylococcus species DETECTED (A) NOT DETECTED Final    Comment: Methicillin (oxacillin) resistant coagulase negative staphylococcus. Possible blood culture contaminant (unless isolated from more than one blood culture draw or clinical case suggests pathogenicity). No antibiotic treatment is indicated for blood  culture contaminants. CRITICAL RESULT CALLED TO, READ BACK BY AND VERIFIED WITH: PHARMD S HALL [id number] [phone] MLM    Staphylococcus aureus NOT DETECTED NOT DETECTED Final   Methicillin resistance  DETECTED (A) NOT DETECTED Final    Comment: CRITICAL RESULT CALLED TO, READ BACK BY AND VERIFIED WITH: PHARMD S HALL [phone] MLM    Streptococcus species NOT DETECTED NOT DETECTED Final   Streptococcus agalactiae NOT DETECTED NOT DETECTED Final   Streptococcus pneumoniae NOT DETECTED NOT DETECTED Final   Streptococcus pyogenes NOT DETECTED NOT DETECTED Final   Acinetobacter baumannii NOT DETECTED NOT DETECTED Final   Enterobacteriaceae species NOT DETECTED NOT DETECTED Final   Enterobacter cloacae complex NOT DETECTED NOT DETECTED Final   Escherichia coli NOT DETECTED NOT DETECTED Final   Klebsiella oxytoca NOT DETECTED NOT DETECTED Final   Klebsiella pneumoniae NOT DETECTED NOT DETECTED Final   Proteus species NOT DETECTED NOT DETECTED Final   Serratia marcescens NOT DETECTED NOT DETECTED Final   Haemophilus influenzae NOT DETECTED NOT DETECTED Final   Neisseria meningitidis NOT DETECTED NOT DETECTED Final   Pseudomonas aeruginosa NOT DETECTED NOT DETECTED Final   Candida albicans NOT DETECTED NOT DETECTED Final   Candida  glabrata NOT DETECTED NOT DETECTED Final   Candida krusei NOT DETECTED NOT DETECTED Final   Candida parapsilosis NOT DETECTED NOT DETECTED Final   Candida tropicalis NOT DETECTED NOT DETECTED Final    Comment: Performed at [facility], [address] [place], [place] [address]  Blood culture (routine x 2)     Status: None (Preliminary result)   Collection Time: 03/23/16 11:05 AM  Result Value Ref Range Status   Specimen Description LEFT ANTECUBITAL  Final   Special Requests BOTTLES DRAWN AEROBIC AND ANAEROBIC [facility] EACH  Final   Culture PENDING  Incomplete   Report Status PENDING  Incomplete         Radiology Studies: Dg Chest 2 View  Result Date: 03/23/2016 CLINICAL DATA:  Fever for 2 days. Weakness. Altered mental status this morning. EXAM: CHEST  2 VIEW COMPARISON:  Single-view of the chest 04/22/2014 and PA and lateral chest 01/20/2013. FINDINGS: There is airspace disease in the periphery of the right lower lobe. Left lung appears clear. Heart size is normal. Surgical clips left axilla are noted. IMPRESSION: Right lower lobe airspace disease worrisome for pneumonia or aspiration. Electronically Signed   By: Drusilla Kanner M.D.   On: 03/23/2016 11:17        Scheduled Meds: . azithromycin  500 mg Oral Q24H  . cefTRIAXone (ROCEPHIN)  IV  1 g Intravenous Q24H  . donepezil  5 mg Oral QHS  . enoxaparin (LOVENOX) injection  40 mg Subcutaneous Q24H  . gabapentin  300 mg Oral Daily  . gabapentin  600 mg Oral QHS  . vancomycin  1,250 mg Intravenous Q24H   Continuous Infusions:   LOS: 0 days    Time spent: 35 minutes    Katrinka Blazing, MD [facility] Pager [phone]  If 7PM-7AM, please contact night-coverage [email or web address] Password [facility] 03/24/2016, 10:36 AM

## 2016-03-24 NOTE — Progress Notes (Signed)
PHARMACY - PHYSICIAN COMMUNICATION CRITICAL VALUE ALERT - BLOOD CULTURE IDENTIFICATION (BCID)  Results for orders placed or performed during the hospital encounter of 03/23/16  Blood Culture ID Panel (Reflexed) (Collected: 03/23/2016 10:58 AM)  Result Value Ref Range   Enterococcus species NOT DETECTED NOT DETECTED   Listeria monocytogenes NOT DETECTED NOT DETECTED   Staphylococcus species DETECTED (A) NOT DETECTED   Staphylococcus aureus NOT DETECTED NOT DETECTED   Methicillin resistance DETECTED (A) NOT DETECTED   Streptococcus species NOT DETECTED NOT DETECTED   Streptococcus agalactiae NOT DETECTED NOT DETECTED   Streptococcus pneumoniae NOT DETECTED NOT DETECTED   Streptococcus pyogenes NOT DETECTED NOT DETECTED   Acinetobacter baumannii NOT DETECTED NOT DETECTED   Enterobacteriaceae species NOT DETECTED NOT DETECTED   Enterobacter cloacae complex NOT DETECTED NOT DETECTED   Escherichia coli NOT DETECTED NOT DETECTED   Klebsiella oxytoca NOT DETECTED NOT DETECTED   Klebsiella pneumoniae NOT DETECTED NOT DETECTED   Proteus species NOT DETECTED NOT DETECTED   Serratia marcescens NOT DETECTED NOT DETECTED   Haemophilus influenzae NOT DETECTED NOT DETECTED   Neisseria meningitidis NOT DETECTED NOT DETECTED   Pseudomonas aeruginosa NOT DETECTED NOT DETECTED   Candida albicans NOT DETECTED NOT DETECTED   Candida glabrata NOT DETECTED NOT DETECTED   Candida krusei NOT DETECTED NOT DETECTED   Candida parapsilosis NOT DETECTED NOT DETECTED   Candida tropicalis NOT DETECTED NOT DETECTED   Name of physician (or Provider) Contacted: Dr Rinaldo RatelKadolph  Changes to prescribed antibiotics required: Continue Vancomycin, Rocephin and Zithromax (po), f/u ID recommendations  Valrie HartHall, Clariece Roesler A 03/24/2016  9:55 AM

## 2016-03-24 NOTE — Evaluation (Signed)
Physical Therapy Evaluation Patient Details Name: Jennifer [name] MRN: [id number] DOB: April 10, 1922 Today's Date: 03/24/2016   History of Present Illness  Jennifer [name] is a 83 y.o. female with medical history significant of CAD, CVA, COPD, and occasional urinary incontinence brought in by family for concerns of increase in her urinary incontinence and fever at home.  For the past few days patient has had more episodes of urinary incontinence than normal.  She has slight dementia at baseline and therefore family could not get a good assessment of if she was hurting or not.  No sick contacts, no recent travel.  Patient ambulates with a walker and is self sufficient for ADL's.  No increased work of breathing, but a cough that nonproductive. Patient reports rhinorrhea.  No diarrhea, abdominal pain, chest pain.  Clinical Impression  Pt received in bed and was agreeable to PT evaluation. Pt is from home and reported she was able to ambulate with RW without assistance and that her grandson lives with her. She currently requires supervision to min guard for bed mobility and max assist x 1 for sit <> stand and stand pivot transfers with RW. Pt was on 4L of O2 throughout entire session - she was at 100% while at rest in bed but following stand pivot transfer to the chair, she de-sated to 74%. After about 5 mins of a seated rest break, pt's O2 improved to 91%. Due to amount of assistance required this date, PT is recommending SNF upon d/c for further rehab in order to maximize pt's strength and decrease risk of falls. Discussed this with pt and she stated "I'll do whatever." Will update recommendation as needed pending pt's progress.    Follow Up Recommendations SNF    Equipment Recommendations  None recommended by PT    Recommendations for Other Services       Precautions / Restrictions Precautions Precautions: Fall      Mobility  Bed Mobility Overal bed mobility: Needs Assistance Bed Mobility:  Supine to Sit     Supine to sit: Supervision;Min guard     General bed mobility comments: cues for proper technique, once EOB, pt had increased posterior lean which required supv - min guard to correct  Transfers Overall transfer level: Needs assistance Equipment used: Rolling walker (2 wheeled) Transfers: Sit to/from [employer]GI CorporationStand;Stand Pivot Transfers Sit to Stand: Max assist Stand pivot transfers: Mod assist       General transfer comment: max assist x 1 for sit <> stand, pt required cueing for proper hand placement and to attain full upright position; mod assist for stand pivot transfer bed > chair  Ambulation/Gait                Stairs            Wheelchair Mobility    Modified Rankin (Stroke Patients Only)       Balance Overall balance assessment: Needs assistance Sitting-balance support: Feet supported Sitting balance-Leahy Scale: Fair Sitting balance - Comments: increased posterior lean while sitting EOB, requiring vc's/min guard to correct Postural control: Posterior lean Standing balance support: Bilateral upper extremity supported Standing balance-Leahy Scale: Fair                               Pertinent Vitals/Pain Pain Assessment: 0-10 Pain Score:  (unable to rate states "they're getting worse") Pain Location: back and hips Pain Descriptors / Indicators: Sore Pain Intervention(s): Limited activity within patient's  tolerance    Home Living Family/patient expects to be discharged to:: Private residence Living Arrangements: Other relatives (grandson lives with her) Available Help at Discharge: Family Type of Home: House       Home Layout: Multi-level;Able to live on main level with bedroom/bathroom (basement) Home Equipment: Dan Humphreys - 2 wheels;Wheelchair - manual      Prior Function Level of Independence: Independent with assistive device(s)   Gait / Transfers Assistance Needed: She states she was doing great prior to admission.  She was getting around with her RW.  ADL's / Homemaking Assistance Needed: She states she could dress and bathe herself prior to admission.        Hand Dominance   Dominant Hand: Right    Extremity/Trunk Assessment   Upper Extremity Assessment Upper Extremity Assessment: Generalized weakness    Lower Extremity Assessment Lower Extremity Assessment: Generalized weakness       Communication   Communication: HOH  Cognition Arousal/Alertness: Awake/alert Behavior During Therapy: WFL for tasks assessed/performed Overall Cognitive Status: Within Functional Limits for tasks assessed (did not know month or year)                      General Comments      Exercises     Assessment/Plan    PT Assessment Patient needs continued PT services  PT Problem List Decreased strength;Decreased activity tolerance;Decreased mobility;Decreased balance       PT Treatment Interventions Gait training;Functional mobility training;Therapeutic exercise;Therapeutic activities;Patient/family education    PT Goals (Current goals can be found in the Care Plan section)  Acute Rehab PT Goals Patient Stated Goal: to go home PT Goal Formulation: With patient Time For Goal Achievement: 03/27/16 Potential to Achieve Goals: Good    Frequency Min 4X/week   Barriers to discharge        Co-evaluation               End of Session Equipment Utilized During Treatment: Gait belt;Oxygen (4L O2) Activity Tolerance: Patient tolerated treatment well;Patient limited by fatigue Patient left: in chair;with call bell/phone within reach;Other (comment) (with Web designer) Nurse Communication: Mobility status PT Visit Diagnosis: Muscle weakness (generalized) (M62.81);Difficulty in walking, not elsewhere classified (R26.2);Unsteadiness on feet (R26.81)    Functional Assessment Tool Used: AM-PAC 6 Clicks Basic Mobility;Clinical judgement Functional Limitation: Mobility: Walking and moving  around Mobility: Walking and Moving Around Current Status (Z6109): At least 60 percent but less than 80 percent impaired, limited or restricted Mobility: Walking and Moving Around Goal Status [phone]): At least 20 percent but less than 40 percent impaired, limited or restricted    Time: 0981-1914 PT Time Calculation (min) (ACUTE ONLY): 31 min   Charges:   PT Evaluation $PT Eval Low Complexity: 1 Procedure PT Treatments $Therapeutic Activity: 8-22 mins   PT G Codes:   PT G-Codes **NOT FOR INPATIENT CLASS** Functional Assessment Tool Used: AM-PAC 6 Clicks Basic Mobility;Clinical judgement Functional Limitation: Mobility: Walking and moving around Mobility: Walking and Moving Around Current Status (N8295): At least 60 percent but less than 80 percent impaired, limited or restricted Mobility: Walking and Moving Around Goal Status [phone]): At least 20 percent but less than 40 percent impaired, limited or restricted      Jac Canavan PT, DPT

## 2016-03-25 ENCOUNTER — Ambulatory Visit: Payer: Medicare Other | Admitting: Family Medicine

## 2016-03-25 ENCOUNTER — Encounter: Payer: Self-pay | Admitting: Family Medicine

## 2016-03-25 DIAGNOSIS — J9601 Acute respiratory failure with hypoxia: Secondary | ICD-10-CM

## 2016-03-25 LAB — URINE CULTURE: Culture: >=100000 — AB

## 2016-03-25 LAB — GLUCOSE, CAPILLARY: Glucose-Capillary: 72 mg/dL (ref 65–99)

## 2016-03-25 MED ORDER — IBUPROFEN 400 MG PO TABS
400.0000 mg | ORAL_TABLET | Freq: Four times a day (QID) | ORAL | Status: DC | PRN
  Administered 2016-03-25 – 2016-03-26 (×2): 400 mg via ORAL
  Filled 2016-03-25 (×2): qty 1

## 2016-03-25 MED ORDER — DEXTROSE 5 % IV SOLN
INTRAVENOUS | Status: AC
  Filled 2016-03-25: qty 10

## 2016-03-25 MED ORDER — DEXTROSE 5 % IV SOLN
1.0000 g | INTRAVENOUS | Status: DC
  Administered 2016-03-25 – 2016-03-26 (×2): 1 g via INTRAVENOUS
  Filled 2016-03-25 (×5): qty 10

## 2016-03-25 NOTE — Progress Notes (Addendum)
Physical Therapy Treatment Patient Details Name: Jennifer [name] MRN: [id number] DOB: 17-Sep-1922 Today's Date: 03/25/2016    History of Present Illness Jennifer [name] is a 83 y.o. female with medical history significant of CAD, CVA, COPD, and occasional urinary incontinence brought in by family for concerns of increase in her urinary incontinence and fever at home.  For the past few days patient has had more episodes of urinary incontinence than normal.  She has slight dementia at baseline and therefore family could not get a good assessment of if she was hurting or not.  No sick contacts, no recent travel.  Patient ambulates with a walker and is self sufficient for ADL's.  No increased work of breathing, but a cough that nonproductive. Patient reports rhinorrhea.  No diarrhea, abdominal pain, chest pain.    PT Comments    Pt received sitting up in the chair, dtr and sister both present, and pt is agreeable to PT tx.  During tx, pt demonstrated improved sit<>stand, however she did attempt several times before she was able to come into full upright standing.  Dtr attempted to assist pt into standing position, and PT had to educate the dtr about importance of allowing the pt to perform mobility on her own to allow PT to properly assess her mobility.  She was able to ambulate 42ft with RW today, however she became SOB with audible wheezing and SpO2 had desaturated to 84% while on 4L.  Dtr who was present during tx was encouraging pt to continue ambulating while PT had to educate dtr and patient on why further ambulation was not medically appropriate due to SpO2 level.  Pt continues to be very weak and require increased assistance for all functional mobility at this point.  Continue to recommend SNF at this time.     Follow Up Recommendations  SNF     Equipment Recommendations  None recommended by PT    Recommendations for Other Services       Precautions / Restrictions  Precautions Precautions: Fall Restrictions Weight Bearing Restrictions: No    Mobility  Bed Mobility                  Transfers Overall transfer level: Needs assistance Equipment used: Rolling walker (2 wheeled) Transfers: Sit to/from Stand Sit to Stand: Min assist (increased time to scoot hips to the edge of the chair, and multiple attempts made prior to obtaining full upright stance. )            Ambulation/Gait Ambulation/Gait assistance: Min assist Ambulation Distance (Feet): 20 Feet Assistive device: Rolling walker (2 wheeled) Gait Pattern/deviations: Step-to pattern;Wide base of support;Trunk flexed;Decreased step length - left;Decreased stance time - right   Gait velocity interpretation: <1.8 ft/sec, indicative of risk for recurrent falls General Gait Details: Poor terminal knee extension noted.  SpO2 desaturated to 84% while on 4L of O2 with audible wheezing.  Dtr present during PT tx, and required education on why PT was limiting the patient's gait distance due to these factors.    Stairs            Wheelchair Mobility    Modified Rankin (Stroke Patients Only)       Balance Overall balance assessment: Needs assistance         Standing balance support: Bilateral upper extremity supported Standing balance-Leahy Scale: Fair                      Cognition Arousal/Alertness: Awake/alert  Overall Cognitive Status: Impaired/Different from baseline Area of Impairment: Orientation Orientation Level: Disoriented to;Place;Situation                  Exercises General Exercises - Lower Extremity Long Arc Quad: Strengthening;Both;10 reps;Seated Hip Flexion/Marching: Strengthening;Both;10 reps;Seated    General Comments        Pertinent Vitals/Pain Pain Location: chronic back pain - not rated Pain Intervention(s): Other (comment);Limited activity within patient's tolerance;Monitored during session;Repositioned Jennifer [name]([name], RN aware  and present in the room during tx. )    Home Living                      Prior Function            PT Goals (current goals can now be found in the care plan section) Acute Rehab PT Goals Patient Stated Goal: to go home PT Goal Formulation: With patient Time For Goal Achievement: 04/01/16 Potential to Achieve Goals: Fair Progress towards PT goals: Progressing toward goals    Frequency    Min 4X/week      PT Plan Current plan remains appropriate    Co-evaluation             End of Session Equipment Utilized During Treatment: Gait belt;Oxygen Activity Tolerance: Patient limited by fatigue Patient left: in chair;with call bell/phone within reach;with chair alarm set;with family/visitor present Nurse Communication: Mobility status Jennifer [name]([name], RN notified of pt's respiratory status during tx.) PT Visit Diagnosis: Muscle weakness (generalized) (M62.81);Difficulty in walking, not elsewhere classified (R26.2);Unsteadiness on feet (R26.81)     Time: [id number] PT Time Calculation (min) (ACUTE ONLY): 28 min  Charges:  $Gait Training: 8-22 mins $Therapeutic Exercise: 8-22 mins                    G Codes:       Jennifer [name], PT, DPT X: [phone]

## 2016-03-25 NOTE — Clinical Social Work Note (Signed)
Clinical Social Work Assessment  Patient Details  Name: Jennifer [name] MRN: [id number] Date of Birth: [birth year]  Date of referral:  03/25/16               Reason for consult:  Discharge Planning                Permission sought to share information with:  Family Supports Permission granted to share information::  Yes, Verbal Permission Granted  Name::     Engineer, petroleum::     Relationship::  daughter  Contact Information:     Housing/Transportation Living arrangements for the past 2 months:  Single Family Home Source of Information:  Patient, Adult Children Patient Interpreter Needed:  None Criminal Activity/Legal Involvement Pertinent to Current Situation/Hospitalization:  No - Comment as needed Significant Relationships:  Adult Children, Other Family Members Lives with:  Adult Children Do you feel safe going back to the place where you live?  Yes Need for family participation in patient care:  Yes (Comment)  Care giving concerns:  Pt is not at baseline.    Social Worker assessment / plan:  CSW met with pt and pt's daughter, Jennifer [name] at bedside. Pt oriented to self, place, and somewhat to situation and defers conversation to her daughter. Jennifer [name] states that she moved here two years ago to help care for her mother and is with her 24/7. She went out of town for the weekend and pt's grandson was staying with her. At baseline, pt is fairly independent. She ambulates with a walker and dresses and bathes herself. PT evaluated pt yesterday and pt required max assist to stand. Recommendation for SNF. CSW discussed placement process, including Medicare coverage/criteria. Gwendolyn agrees and CSW will initiate bed search.   Employment status:  Retired Forensic scientist:  Medicare PT Recommendations:  [place] / Referral to community resources:  [place]  Patient/Family's Response to care:  Pt's daughter agrees with SNF recommendation  and requests [facility] if available.   Patient/Family's Understanding of and Emotional Response to Diagnosis, Current Treatment, and Prognosis:  Pt's daughter appears to be knowledgeable of pt's medical history and admission diagnosis.   Emotional Assessment Appearance:  Appears stated age Attitude/Demeanor/Rapport:  Other (Pleasant) Affect (typically observed):  Accepting Orientation:  Oriented to Self, Oriented to Place Alcohol / Substance use:  Not Applicable Psych involvement (Current and /or in the community):  No (Comment)  Discharge Needs  Concerns to be addressed:  Discharge Planning Concerns Readmission within the last 30 days:  No Current discharge risk:  Physical Impairment Barriers to Discharge:  Continued Medical Work up   Salome Arnt, [place] 03/25/2016, 9:21 AM [phone]

## 2016-03-25 NOTE — NC FL2 (Signed)
[place] MEDICAID FL2 LEVEL OF CARE SCREENING TOOL     IDENTIFICATION  Patient Name: Jennifer [name] Birthdate: 1922-08-06 Sex: female Admission Date (Current Location): 03/23/2016  [facility] [place]   Facility and Address:  [facility],  [address], [place] [id number]      Provider Number: [id number]  Attending Physician Name and Address:  Standley Brookinganiel P Goodrich, MD  Relative Name and Phone Number:       Current Level of Care: Hospital Recommended Level of Care: Skilled Nursing Facility Prior Approval Number:    Date Approved/Denied:   PASRR Number: [id number]  Discharge Plan: SNF    Current Diagnoses: Patient Active Problem List   Diagnosis Date Noted  . Pneumonia 03/23/2016  . Medicare annual wellness visit, subsequent 06/13/2015  . DNR no code (do not resuscitate) 04/25/2014  . Urinary incontinence 04/04/2013  . Osteoporosis, post-menopausal 12/06/2012  . At high risk for falls 07/30/2012  . MCI (mild cognitive impairment) 08/19/2011  . COPD (chronic obstructive pulmonary disease) (HCC) 06/25/2011  . CEREBROVASCULAR DISEASE 08/05/2009  . Spinal stenosis 06/09/2007  . Coronary atherosclerosis 01/25/2007  . HIATAL HERNIA WITH REFLUX 01/25/2007  . Osteoarthritis 01/25/2007    Orientation RESPIRATION BLADDER Height & Weight     Self, Place  O2 (4 L) Incontinent Weight: 215 lb (97.5 kg) Height:  5\' 4"  (162.6 cm)  BEHAVIORAL SYMPTOMS/MOOD NEUROLOGICAL BOWEL NUTRITION STATUS  Other (Comment) (none)  (n/a) Incontinent Diet (Heart healthy)  AMBULATORY STATUS COMMUNICATION OF NEEDS Skin   Extensive Assist Verbally Normal                       Personal Care Assistance Level of Assistance  Bathing, Feeding, Dressing Bathing Assistance: Maximum assistance Feeding assistance: Limited assistance Dressing Assistance: Maximum assistance     Functional Limitations Info  Sight, Hearing, Speech Sight Info: Impaired Hearing  Info: Adequate Speech Info: Adequate    SPECIAL CARE FACTORS FREQUENCY  PT (By licensed PT)     PT Frequency: 5              Contractures      Additional Factors Info  Code Status, Allergies Code Status Info: DNR Allergies Info: Iohexol, Iron, Penicillins           Current Medications (03/25/2016):  This is the current hospital active medication list Current Facility-Administered Medications  Medication Dose Route Frequency Provider Last Rate Last Dose  . acetaminophen (TYLENOL) tablet 650 mg  650 mg Oral Q6H PRN Filbert SchilderAlexandria U Kadolph, MD       Or  . acetaminophen (TYLENOL) suppository 650 mg  650 mg Rectal Q6H PRN Filbert SchilderAlexandria U Kadolph, MD      . albuterol (PROVENTIL) (2.5 MG/3ML) 0.083% nebulizer solution 2.5 mg  2.5 mg Nebulization Q6H PRN Filbert SchilderAlexandria U Kadolph, MD   2.5 mg at 03/24/16 1614  . azithromycin (ZITHROMAX) tablet 500 mg  500 mg Oral Q24H Filbert SchilderAlexandria U Kadolph, MD   500 mg at 03/24/16 0948  . cefUROXime (CEFTIN) tablet 500 mg  500 mg Oral BID WC Filbert SchilderAlexandria U Kadolph, MD      . donepezil (ARICEPT) tablet 5 mg  5 mg Oral QHS Filbert SchilderAlexandria U Kadolph, MD   5 mg at 03/24/16 2144  . enoxaparin (LOVENOX) injection 40 mg  40 mg Subcutaneous Q24H Filbert SchilderAlexandria U Kadolph, MD   40 mg at 03/24/16 2144  . gabapentin (NEURONTIN) capsule 300 mg  300 mg Oral Daily Filbert SchilderAlexandria U Kadolph,  MD   300 mg at 03/24/16 0947  . gabapentin (NEURONTIN) capsule 600 mg  600 mg Oral QHS Filbert Schilder, MD   600 mg at 03/24/16 2144     Discharge Medications: Please see discharge summary for a list of discharge medications.  Relevant Imaging Results:  Relevant Lab Results:   Additional Information SSN: [id number]  Karn [place], [place] [phone]

## 2016-03-25 NOTE — Progress Notes (Signed)
SLP Cancellation Note  Patient Details Name: Jennifer BeetsVelma M [place]acusa MRN: [id number] DOB: 14-Jul-1922   Cancelled treatment:       Reason Eval/Treat Not Completed: Fatigue/lethargy limiting ability to participate. Pt was sleeping soundly and Pt's daughter requested SLP "not wake her"; Pt has had "severe back pain" all day and is "finally resting". SLP will re-attempt BSE tomorrow. Thank you,  Shritha Bresee H. Romie LeveeYarbrough MA, [name] Speech Language Pathologist   Georgetta Habermelia H Ryelan Kazee 03/25/2016, 5:00 PM

## 2016-03-25 NOTE — Clinical Social Work Placement (Signed)
   CLINICAL SOCIAL WORK PLACEMENT  NOTE  Date:  03/25/2016  Patient Details  Name: Jennifer [name] MRN: [id number] Date of Birth: Mar 31, 1922  Clinical Social Work is seeking post-discharge placement for this patient at the Skilled  Nursing Facility level of care (*CSW will initial, date and re-position this form in  chart as items are completed):  Yes   Patient/family provided with [facility] Clinical Social Work Department's list of facilities offering this level of care within the geographic area requested by the patient (or if unable, by the patient's family).  Yes   Patient/family informed of their freedom to choose among providers that offer the needed level of care, that participate in Medicare, Medicaid or managed care program needed by the patient, have an available bed and are willing to accept the patient.      Patient/family informed of [facility]'s ownership interest in [facility] and [facility], as well as of the fact that they are under no obligation to receive care at these facilities.  PASRR submitted to EDS on       PASRR number received on       Existing PASRR number confirmed on 03/25/16     FL2 transmitted to all facilities in geographic area requested by pt/family on 03/25/16     FL2 transmitted to all facilities within larger geographic area on       Patient informed that his/her managed care company has contracts with or will negotiate with certain facilities, including the following:            Patient/family informed of bed offers received.  Patient chooses bed at       Physician recommends and patient chooses bed at      Patient to be transferred to   on  .  Patient to be transferred to facility by       Patient family notified on   of transfer.  Name of family member notified:        PHYSICIAN       Additional Comment:    _______________________________________________ Karn CassisStultz, Ashira Kirsten Shanaberger, LCSW 03/25/2016, 9:20  AM [phone]

## 2016-03-25 NOTE — Progress Notes (Addendum)
  PROGRESS NOTE  Jennifer BeetsVelma M [name] WGN:[id number] DOB: 10/13/1922 DOA: 03/23/2016 PCP: Syliva OvermanMargaret Simpson, MD  Brief Narrative: 83 year old woman presented from home with foul-smelling urine, productive cough. Admitted for pneumonia  Assessment/Plan 1. Right lower lobe pneumonia 2. Acute hypoxic respiratory failure secondary to above 3. UTI. Continue ceftriaxone. 4. COPD listed as a medical problem, however daughter reports completely asymptomatic 5. PMH CVA, dementia, CAD   Seems to be making slow improvement, still has significant hypoxia with exertion, continue empiric treatment for lobar pneumonia. Follow-up speech therapy recommendations. Plan for discharge to skilled nursing facility the next 2-3 days.  DVT prophylaxis: Lovenox Code Status: DNR Family Communication: daughter and grandson at bedside. 35 minutes, greater than 50% in discussing care, reviewing laboratory and imaging results and discussing treatment of pneumonia and UTI. Disposition Plan: SNF  Brendia Sacksaniel Goodrich, MD  [facility] Direct contact: [phone] --Via amion app OR  --[email or web address]; password [email or web address]  7PM-7AM contact night coverage as above 03/25/2016, 5:39 PM  LOS: 1 day   Consultants:    Procedures:    Antimicrobials:  Azithromycin 3/12 >>  Ceftriaxone 3/12 >> Interval history/Subjective: Feeling better. Still short of breath, weak.  Objective: Vitals:   03/25/16 0641 03/25/16 1140 03/25/16 1237 03/25/16 1300  BP: (!) 129/47   (!) 125/49  Pulse: 78   95  Resp: 18   18  Temp: 99.2 F (37.3 C)     TempSrc: Oral   Oral  SpO2: 100% 100% (!) 84% 98%  Weight:      Height:        Intake/Output Summary (Last 24 hours) at 03/25/16 1739 Last data filed at 03/25/16 1200  Gross per 24 hour  Intake              480 ml  Output                0 ml  Net              480 ml     Filed Weights   03/23/16 1031  Weight: 97.5 kg (215 lb)    Exam:    Constitutional: Appears ill but not  toxic. Respiratory: Bilateral rhonchi. No wheezes or rales. Mild to moderate increased respiratory effort. Cardiovascular regular rate and rhythm. No murmur, rub or gallop. Abdomen soft, nontender, nondistended Psychiatric. Grossly normal mood and affect. Speech fluent and appropriate   I have personally reviewed following labs and imaging studies:  No new laboratory studies  Scheduled Meds: . azithromycin  500 mg Oral Q24H  . cefTRIAXone (ROCEPHIN)  IV  1 g Intravenous Q24H  . donepezil  5 mg Oral QHS  . enoxaparin (LOVENOX) injection  40 mg Subcutaneous Q24H  . gabapentin  300 mg Oral Daily  . gabapentin  600 mg Oral QHS   Continuous Infusions:  Principal Problem:   Lobar pneumonia (HCC) Active Problems:   Coronary atherosclerosis   Osteoarthritis   Spinal stenosis   COPD (chronic obstructive pulmonary disease) (HCC)   MCI (mild cognitive impairment)   Urinary incontinence   Acute hypoxemic respiratory failure (HCC)   LOS: 1 day

## 2016-03-26 LAB — CULTURE, BLOOD (ROUTINE X 2)

## 2016-03-26 LAB — GLUCOSE, CAPILLARY: GLUCOSE-CAPILLARY: 145 mg/dL — AB (ref 65–99)

## 2016-03-26 MED ORDER — BISACODYL 10 MG RE SUPP
10.0000 mg | Freq: Every day | RECTAL | Status: DC | PRN
Start: 2016-03-26 — End: 2016-03-27
  Administered 2016-03-27: 10 mg via RECTAL
  Filled 2016-03-26: qty 1

## 2016-03-26 MED ORDER — SENNA 8.6 MG PO TABS
1.0000 | ORAL_TABLET | Freq: Every day | ORAL | Status: DC
  Administered 2016-03-26: 8.6 mg via ORAL
  Filled 2016-03-26: qty 1

## 2016-03-26 MED ORDER — POLYETHYLENE GLYCOL 3350 17 G PO PACK
17.0000 g | PACK | Freq: Two times a day (BID) | ORAL | Status: DC
  Administered 2016-03-26 – 2016-03-27 (×2): 17 g via ORAL
  Filled 2016-03-26 (×2): qty 1

## 2016-03-26 NOTE — Clinical Social Work Note (Signed)
CSW provided bed offers to pt's daughter. She states [facility] and will notify CSW of decision.  Derenda FennelKara Tyne Banta, LCSW [phone]

## 2016-03-26 NOTE — Progress Notes (Signed)
  PROGRESS NOTE  Jennifer [name] ZOX:[id number] DOB: 02-Jan-1923 DOA: 03/23/2016 PCP: Jennifer [name]  Brief Narrative: 83 year old woman presented from home with foul-smelling urine, productive cough. Admitted for pneumonia  Assessment/Plan 1. Right lower lobe pneumonia, slowly improving. 2. Acute hypoxic respiratory failure secondary to above, slowly improving. 3. UTI. Treating with ceftriaxone. 4. COPD listed as a medical problem, however daughter repored completely asymptomatic 5. PMH CVA, dementia, CAD   Seems to be making slow improvement. Had low-grade fever last night. But clinically seems to be improving. Continue present management. Anticipate discharge to skilled nursing facility next 24 hours.  DVT prophylaxis: Lovenox Code Status: DNR Family Communication:  Disposition Plan: SNF  Jennifer Sacksaniel Goodrich, [name]  [facility] Direct contact: [phone] --Via amion app OR  --[email or web address]; password [email or web address]  7PM-7AM contact night coverage as above 03/26/2016, 4:01 PM  LOS: 2 days   Consultants:    Procedures:    Antimicrobials:  Azithromycin 3/12 >>  Ceftriaxone 3/12 >> Interval history/Subjective: Overall feeling better. No pain. Breathing okay.  Objective: Vitals:   03/25/16 1300 03/25/16 2233 03/26/16 0554 03/26/16 1318  BP: (!) 125/49 (!) 138/54 (!) 121/45 (!) 109/38  Pulse: 95 92 83 84  Resp: 18 16 16 18   Temp:  100 F (37.8 C) (!) 100.8 F (38.2 C) 98.5 F (36.9 C)  TempSrc: Oral Oral Oral Oral  SpO2: 98% 100% 99% 94%  Weight:      Height:        Intake/Output Summary (Last 24 hours) at 03/26/16 1601 Last data filed at 03/26/16 1300  Gross per 24 hour  Intake              530 ml  Output                0 ml  Net              530 ml     Filed Weights   03/23/16 1031  Weight: 97.5 kg (215 lb)    Exam:    Constitutional. Appears calm, comfortable.  Respiratory. Clear to auscultation bilaterally. No wheezes, rales or rhonchi. Mild  increased respiratory effort.  Cardiac vascular. Regular rate and rhythm. No murmur, rub or gallop.  Psychiatric. Speech fluent and appropriate. Grossly normal mood and affect.   I have personally reviewed following labs and imaging studies:  No new laboratory studies  Scheduled Meds: . azithromycin  500 mg Oral Q24H  . cefTRIAXone (ROCEPHIN)  IV  1 g Intravenous Q24H  . donepezil  5 mg Oral QHS  . enoxaparin (LOVENOX) injection  40 mg Subcutaneous Q24H  . gabapentin  300 mg Oral Daily  . gabapentin  600 mg Oral QHS   Continuous Infusions:  Principal Problem:   Lobar pneumonia (HCC) Active Problems:   Coronary atherosclerosis   Osteoarthritis   Spinal stenosis   COPD (chronic obstructive pulmonary disease) (HCC)   MCI (mild cognitive impairment)   Urinary incontinence   Acute hypoxemic respiratory failure (HCC)   LOS: 2 days

## 2016-03-26 NOTE — Progress Notes (Signed)
Physical Therapy Treatment Patient Details Name: Jennifer [name] MRN: [id number] DOB: [birth year] Today's Date: 03/26/2016    History of Present Illness Jennifer [name] is a 83 y.o. female with medical history significant of CAD, CVA, COPD, and occasional urinary incontinence brought in by family for concerns of increase in her urinary incontinence and fever at home.  For the past few days patient has had more episodes of urinary incontinence than normal.  She has slight dementia at baseline and therefore family could not get a good assessment of if she was hurting or not.  No sick contacts, no recent travel.  Patient ambulates with a walker and is self sufficient for ADL's.  No increased work of breathing, but a cough that nonproductive. Patient reports rhinorrhea.  No diarrhea, abdominal pain, chest pain.    PT Comments    Pt received in bed, and dtr present.  Pt's mobility demonstrates significant change compared to the previous 2 treatments.  During PT tx today, she required Max A for supine<>sit, and at least Min/Mod A for static sitting balance on the EOB.  Pt demonstrates significant posterior lean when sitting on the EOB, therefore she is not appropriate to attempt transfer to the chair at this time.  Dtr present during the treatment and PT requested that dtr refrain from assisting the pt during PT session so the PT could get an accurate assessment.  Dtr kept saying "she was not like this before she came in.  She just needs to get up out of this bed." Dtr does not seem to realize the extent of the pt's debility at this time.  Continue to recommend SNF as pt is not safe to d/c home due to increased level of assistance she is requiring for all functional mobility tasks.      Follow Up Recommendations  SNF     Equipment Recommendations  None recommended by PT    Recommendations for Other Services       Precautions / Restrictions Precautions Precautions: Fall Restrictions Weight  Bearing Restrictions: No    Mobility  Bed Mobility Overal bed mobility: Needs Assistance Bed Mobility: Supine to Sit     Supine to sit: Max assist;HOB elevated     General bed mobility comments: HOB slightly elevated, however lower than previous tx's.  Pt required increased time to attempt this, as well as 1 step commands due to pt getting confused on which way to move her LE's to get off the edge of the bed.  Once seated on EOB, she had great difficulty maintaining static sitting balance and demonstrates significant posterior lean.    Transfers Overall transfer level:  (Not appropriate due to poor static sitting balance today.  )                  Ambulation/Gait                 Stairs            Wheelchair Mobility    Modified Rankin (Stroke Patients Only)       Balance Overall balance assessment: Needs assistance Sitting-balance support: Feet supported;Bilateral upper extremity supported Sitting balance-Leahy Scale: Poor Sitting balance - Comments: Pt demonstrates significant posteiror lean with sitting on the EOB.  She reqiures constant cues to use her UE's to assist with her balance, and constant cues to lean fwd.   Postural control: Posterior lean  Cognition Arousal/Alertness: Lethargic (Pt initally was wide awake, and then as PT session progressed she became more lethargic.) Behavior During Therapy: Restless (wanting to lay back down.  )                        Exercises General Exercises - Upper Extremity Shoulder Flexion: AROM;Both;10 reps;Seated General Exercises - Lower Extremity Long Arc Quad: Strengthening;Both;10 reps;Seated Hip Flexion/Marching: Strengthening;5 reps;Limitations Hip Flexion/Marching Limitations: Attempted, however pt demonstrates poor technique with increasing fatigue.      General Comments        Pertinent Vitals/Pain Pain Assessment: Faces Faces Pain Scale: Hurts even  more Pain Location: Pt will not express pain location, however dtr states that it is likely her back.  Pain Descriptors / Indicators: Moaning Pain Intervention(s): Limited activity within patient's tolerance;Monitored during session;Repositioned    Home Living                      Prior Function            PT Goals (current goals can now be found in the care plan section) Acute Rehab PT Goals Patient Stated Goal: to go home PT Goal Formulation: With patient Time For Goal Achievement: 04/01/16 Potential to Achieve Goals: Fair Progress towards PT goals: Not progressing toward goals - comment    Frequency    Min 4X/week      PT Plan Current plan remains appropriate    Co-evaluation             End of Session Equipment Utilized During Treatment: Oxygen Activity Tolerance: Patient limited by fatigue;Patient limited by pain Patient left: with call bell/phone within reach;with family/visitor present;in bed Nurse Communication: Mobility status;Need for lift equipment Lowanda Foster, RN notified of chainge in mobility status.  Rec: Hoyer lift for transfers. ) PT Visit Diagnosis: Muscle weakness (generalized) (M62.81);Difficulty in walking, not elsewhere classified (R26.2);Unsteadiness on feet (R26.81)     Time: 4098-1191 PT Time Calculation (min) (ACUTE ONLY): 28 min  Charges:  $Therapeutic Exercise: 8-22 mins $Therapeutic Activity: 8-22 mins                    G Codes:       Beth Matison Nuccio, PT, DPT X: [phone]

## 2016-03-26 NOTE — Clinical Social Work Placement (Deleted)
   CLINICAL SOCIAL WORK PLACEMENT  NOTE  Date:  03/26/2016  Patient Details  Name: Jennifer [name] MRN: [id number] Date of Birth: 08/20/1922  Clinical Social Work is seeking post-discharge placement for this patient at the Skilled  Nursing Facility level of care (*CSW will initial, date and re-position this form in  chart as items are completed):  Yes   Patient/family provided with [place] Clinical Social Work Department's list of facilities offering this level of care within the geographic area requested by the patient (or if unable, by the patient's family).  Yes   Patient/family informed of their freedom to choose among providers that offer the needed level of care, that participate in Medicare, Medicaid or managed care program needed by the patient, have an available bed and are willing to accept the patient.      Patient/family informed of 's ownership interest in [facility] and [facility], as well as of the fact that they are under no obligation to receive care at these facilities.  PASRR submitted to EDS on       PASRR number received on       Existing PASRR number confirmed on 03/25/16     FL2 transmitted to all facilities in geographic area requested by pt/family on 03/25/16     FL2 transmitted to all facilities within larger geographic area on       Patient informed that his/her managed care company has contracts with or will negotiate with certain facilities, including the following:        Yes   Patient/family informed of bed offers received.  [facility]     Physician recommends and patient chooses bed at      [facility] to [facility] on  .  Patient to be transferred to facility by       Patient family notified on   of transfer.  Name of family member notified:        PHYSICIAN       Additional Comment:    _______________________________________________ Karn CassisStultz, Chaunda Vandergriff Shanaberger,  LCSW 03/26/2016, 9:07 AM [phone]

## 2016-03-27 ENCOUNTER — Inpatient Hospital Stay (HOSPITAL_COMMUNITY): Payer: Medicare Other

## 2016-03-27 ENCOUNTER — Encounter (HOSPITAL_COMMUNITY): Payer: Self-pay

## 2016-03-27 ENCOUNTER — Inpatient Hospital Stay
Admission: RE | Admit: 2016-03-27 | Discharge: 2016-04-16 | Disposition: A | Discharge status: Home / Self Care | Payer: Medicare Other | Source: Ambulatory Visit | Attending: Internal Medicine | Admitting: Internal Medicine

## 2016-03-27 DIAGNOSIS — J449 Chronic obstructive pulmonary disease, unspecified: Secondary | ICD-10-CM | POA: Diagnosis not present

## 2016-03-27 DIAGNOSIS — J9601 Acute respiratory failure with hypoxia: Secondary | ICD-10-CM | POA: Diagnosis not present

## 2016-03-27 DIAGNOSIS — J181 Lobar pneumonia, unspecified organism: Secondary | ICD-10-CM | POA: Diagnosis not present

## 2016-03-27 DIAGNOSIS — I679 Cerebrovascular disease, unspecified: Secondary | ICD-10-CM | POA: Diagnosis not present

## 2016-03-27 DIAGNOSIS — K449 Diaphragmatic hernia without obstruction or gangrene: Secondary | ICD-10-CM | POA: Diagnosis not present

## 2016-03-27 DIAGNOSIS — K219 Gastro-esophageal reflux disease without esophagitis: Secondary | ICD-10-CM | POA: Diagnosis not present

## 2016-03-27 DIAGNOSIS — M4800 Spinal stenosis, site unspecified: Secondary | ICD-10-CM | POA: Diagnosis not present

## 2016-03-27 DIAGNOSIS — M810 Age-related osteoporosis without current pathological fracture: Secondary | ICD-10-CM | POA: Diagnosis not present

## 2016-03-27 DIAGNOSIS — R293 Abnormal posture: Secondary | ICD-10-CM | POA: Diagnosis not present

## 2016-03-27 DIAGNOSIS — M6281 Muscle weakness (generalized): Secondary | ICD-10-CM | POA: Diagnosis not present

## 2016-03-27 DIAGNOSIS — R1312 Dysphagia, oropharyngeal phase: Secondary | ICD-10-CM | POA: Diagnosis not present

## 2016-03-27 DIAGNOSIS — N3000 Acute cystitis without hematuria: Secondary | ICD-10-CM | POA: Diagnosis not present

## 2016-03-27 DIAGNOSIS — Z9013 Acquired absence of bilateral breasts and nipples: Secondary | ICD-10-CM | POA: Diagnosis not present

## 2016-03-27 DIAGNOSIS — B9620 Unspecified Escherichia coli [E. coli] as the cause of diseases classified elsewhere: Secondary | ICD-10-CM | POA: Diagnosis not present

## 2016-03-27 DIAGNOSIS — E785 Hyperlipidemia, unspecified: Secondary | ICD-10-CM | POA: Diagnosis not present

## 2016-03-27 DIAGNOSIS — M1990 Unspecified osteoarthritis, unspecified site: Secondary | ICD-10-CM | POA: Diagnosis not present

## 2016-03-27 DIAGNOSIS — M545 Low back pain: Secondary | ICD-10-CM | POA: Diagnosis not present

## 2016-03-27 DIAGNOSIS — J9691 Respiratory failure, unspecified with hypoxia: Secondary | ICD-10-CM | POA: Diagnosis not present

## 2016-03-27 DIAGNOSIS — I2510 Atherosclerotic heart disease of native coronary artery without angina pectoris: Secondary | ICD-10-CM | POA: Diagnosis not present

## 2016-03-27 DIAGNOSIS — I10 Essential (primary) hypertension: Secondary | ICD-10-CM | POA: Diagnosis not present

## 2016-03-27 DIAGNOSIS — D509 Iron deficiency anemia, unspecified: Secondary | ICD-10-CM | POA: Diagnosis not present

## 2016-03-27 DIAGNOSIS — J189 Pneumonia, unspecified organism: Secondary | ICD-10-CM | POA: Diagnosis not present

## 2016-03-27 DIAGNOSIS — Z96653 Presence of artificial knee joint, bilateral: Secondary | ICD-10-CM | POA: Diagnosis not present

## 2016-03-27 DIAGNOSIS — R278 Other lack of coordination: Secondary | ICD-10-CM | POA: Diagnosis not present

## 2016-03-27 DIAGNOSIS — M158 Other polyosteoarthritis: Secondary | ICD-10-CM | POA: Diagnosis not present

## 2016-03-27 DIAGNOSIS — N390 Urinary tract infection, site not specified: Secondary | ICD-10-CM | POA: Diagnosis not present

## 2016-03-27 MED ORDER — CEFUROXIME AXETIL 250 MG PO TABS: 500.0000 mg | ORAL_TABLET | Freq: Two times a day (BID) | ORAL | Status: DC

## 2016-03-27 MED ORDER — CEFUROXIME AXETIL 500 MG PO TABS: 500.0000 mg | ORAL_TABLET | Freq: Two times a day (BID) | ORAL | Status: DC

## 2016-03-27 MED ORDER — POLYETHYLENE GLYCOL 3350 17 G PO PACK: 17.0000 g | PACK | Freq: Two times a day (BID) | ORAL | Status: DC

## 2016-03-27 NOTE — Evaluation (Signed)
Clinical/Bedside Swallow Evaluation Patient Details  Name: Jennifer [name] MRN: [id number] Date of Birth: Sep 02, 1922  Today's Date: 03/27/2016 Time: SLP Start Time (ACUTE ONLY): 0845 SLP Stop Time (ACUTE ONLY): 0915 SLP Time Calculation (min) (ACUTE ONLY): 30 min  Past Medical History:  Past Medical History:  Diagnosis Date  . Anemia    iron deficiency post op.  Marland Kitchen. CAD (coronary artery disease) 2008   Cath 40% mid LAD, 35% RCA  . Cellulitis of finger    right   . Chest pain 2006  . Chronic back pain   . COPD (chronic obstructive pulmonary disease) (HCC)   . Gastroesophageal reflux disease   . Hiatal hernia   . HTN (hypertension) 04/22/2014  . Hyperlipidemia   . Nicotine addiction   . Osteoarthritis    status post left TKA; surgery on the right is anticipated in the near future   . Pulmonary nodules    stable since 2006  . Shingles    right breast   . Skin infection   . Ulcer (HCC)    gential  . Use of cane as ambulatory aid    Past Surgical History:  Past Surgical History:  Procedure Laterality Date  . ABDOMINAL HYSTERECTOMY    . BREAST SURGERY N/A    bilateral  . CARDIAC CATHETERIZATION  2008   Mid LAD 40%, RCA 25%.   . CHOLECYSTECTOMY    . COMBINED HYSTERECTOMY ABDOMINAL W/ A&P REPAIR / OOPHORECTOMY  approx. 40 years ago   . EYE SURGERY    . MASTECTOMY  1998 & 2007    right -1998 / left 2007  . right knee replacement  09/2009   Dr. Romeo AppleHarrison  . TOTAL KNEE ARTHROPLASTY  4/08   left    HPI:  Pt is a 83 y.o. female with PMH of CAD, CVA, COPD, and occasional urinary incontinence brought in by family for concerns of increase in her urinary incontinence and fever at home.For the past few days patient has had more episodes of urinary incontinence than normal. She has slight dementia at baseline and therefore family could not get a good assessment of if she was hurting or not. CXR on 3/12 showed right lower lobe airspace disease worrisome for pneumonia or aspiration.  Bedside swallow eval ordered.   Assessment / Plan / Recommendation Clinical Impression  Pt showed overt s/s of aspiration with ~25% of trials of thin liquid (immediate cough/ throat clear) and a delayed cough following puree consistencies. Pt also had significant pocketing of regular solid (pancake) which was cleared with cues to swallow. Daughter reported noticing occasional coughing with thin liquids at home and that pt had a choking episode on previous date while drinking water. Daughter also noted that pt has demonstrated less engagement while being in the hospital. She noted that she had not noticed the pt pocketing foods PTA. She has had concerns at home of pt's positioning during meals and that pt often leans over in chair to decrease back pain. Given these findings, pt appears to be at an increased risk of aspiration across consistencies. Until then, please provide full supervision during meals to check for pocketing of solids. Recommend proceeding with MBS to further assess swallow function. Plan for MBS this afternoon.   SLP Visit Diagnosis: Dysphagia, unspecified (R13.10)    Aspiration Risk  Moderate aspiration risk    Diet Recommendation Dysphagia 3 (Mech soft);Thin liquid   Liquid Administration via: Cup;Straw Medication Administration: Whole meds with liquid Supervision: Patient able to self  feed;Full supervision/cueing for compensatory strategies Compensations: Minimize environmental distractions;Slow rate;Small sips/bites;Lingual sweep for clearance of pocketing Postural Changes: Seated upright at 90 degrees    Other  Recommendations Oral Care Recommendations: Oral care BID   Follow up Recommendations Other (comment) (TBD)      Frequency and Duration Other (Comment) (TBD)          Prognosis        Swallow Study   General HPI: Pt is a 83 y.o. female with PMH of CAD, CVA, COPD, and occasional urinary incontinence brought in by family for concerns of increase in her  urinary incontinence and fever at home.For the past few days patient has had more episodes of urinary incontinence than normal. She has slight dementia at baseline and therefore family could not get a good assessment of if she was hurting or not. CXR on 3/12 showed right lower lobe airspace disease worrisome for pneumonia or aspiration. Bedside swallow eval ordered. Type of Study: Bedside Swallow Evaluation Previous Swallow Assessment: none in chart Diet Prior to this Study: Regular;Thin liquids Temperature Spikes Noted: Yes Respiratory Status: Nasal cannula History of Recent Intubation: No Behavior/Cognition: Alert;Cooperative;Requires cueing Oral Cavity Assessment: Within Functional Limits Oral Care Completed by SLP: No Oral Cavity - Dentition: Adequate natural dentition Vision: Functional for self-feeding Self-Feeding Abilities: Able to feed self Patient Positioning: Upright in chair Baseline Vocal Quality: Normal Volitional Cough: Strong Volitional Swallow: Able to elicit    Oral/Motor/Sensory Function Overall Oral Motor/Sensory Function: Within functional limits   Ice Chips Ice chips: Not tested   Thin Liquid Thin Liquid: Impaired Presentation: Cup;Straw Pharyngeal  Phase Impairments: Cough - Immediate;Throat Clearing - Immediate    Nectar Thick Nectar Thick Liquid: Not tested   Honey Thick Honey Thick Liquid: Not tested   Puree Puree: Impaired Presentation: Self Fed;Spoon Pharyngeal Phase Impairments: Cough - Delayed   Solid   GO   Solid: Impaired Presentation: Self Fed Oral Phase Functional Implications: Right lateral sulci pocketing;Oral residue        Quavion Boule K, MA, [name] 03/27/2016,10:47 AM

## 2016-03-27 NOTE — Care Management Important Message (Signed)
Important Message  Patient Details  Name: Jennifer [name] MRN: [id number] Date of Birth: 21-Apr-1922   Medicare Important Message Given:  Yes    Mi Balla, Chrystine OilerSharley Diane, RN 03/27/2016, 2:13 PM

## 2016-03-27 NOTE — Progress Notes (Signed)
Ordered MBS for pt due to overt s/s of aspiration during bedside swallow evaluation and pocketing of solid foods. Plan for MBS at 1:30 this afternoon. Full eval note will be inputed later this a.m. Thank you for this referral.  Thana AtesAmy Oleksiak, MA, [name]

## 2016-03-27 NOTE — Progress Notes (Signed)
Modified Barium Swallow Progress Note  Patient Details  Name: Jennifer [name] MRN: [id number] Date of Birth: 1922-09-01  Today's Date: 03/27/2016  Modified Barium Swallow completed.    Brief recommendations include the following:  Clinical Impression  Pt with a mild-moderate oral phase dysphagia which appears cognitive based. Pt initially demonstrated brief oral holding of thin liquid trials which lasted 5-10 seconds with premature spill to the level of the pyriform sinuses along with decreased epiglottic inversion resulting in trace, silent penetration to the level of the vocal folds on the first two trials. Penetration cleared with cued throat clear. Pt positioning may have had some impact as well as pt leaned head forward to take sips. On subsequent sips pt continued to have premature spill to the pyriform sinuses but did not demonstrate oral holding, and no further penetration/ aspiration observed. Puree and regular trials were within functional limits; however, worth noting that pt had significant pocketing of solids at bedside. Given these findings, pt continues to be at a mild-moderate risk of aspiration. Recommend continuing regular diet/ thin liquids, meds whole with liquid. Pt needs FULL supervision- assist with upright positioning, minimize distractions, cue small bites/ sips, monitor for oral holding/ pocketing- check every 3-4 bites and at end of meal, cue pt to clear throat every 3-4 sips of liquid to clear penetrates. Pt would benefit from continued speech therapy at next level of care to review compensatory strategies.    Swallow Evaluation Recommendations       SLP Diet Recommendations: Regular solids;Thin liquid   Liquid Administration via: Cup;Straw   Medication Administration: Whole meds with liquid   Supervision: Patient able to self feed;Full supervision/cueing for compensatory strategies   Compensations: Minimize environmental distractions;Slow rate;Small  sips/bites;Lingual sweep for clearance of pocketing;Clear throat intermittently   Postural Changes: Seated upright at 90 degrees;Remain semi-upright after after feeds/meals (Comment)   Oral Care Recommendations: Oral care BID        Oleksiak, Amy K, MA, [name] 03/27/2016,2:32 PM

## 2016-03-27 NOTE — Progress Notes (Signed)
PROGRESS NOTE  Juanetta BeetsVelma M Whipkey VHQ:[id number] DOB: 1922/12/05 DOA: 03/23/2016 PCP: Syliva OvermanMargaret Simpson, MD  Brief Narrative: 83 year old woman presented from home with foul-smelling urine, productive cough. Admitted for pneumonia  Assessment/Plan 1. Right lower lobe pneumonia, continues to improve. Treated initially with ceftriaxone and azithromycin. Will change to oral antibiotics in line with this. Given response to treatment, aspiration is felt to be unlikely.  Afebrile, hemodynamics stable. Change to oral antibiotics (Ceftin). 2. Dysphagia.   Further evaluation. MBS per speech therapy. Diet recommendations to follow. 3. Acute hypoxic respiratory failure secondary to above  Continues to slowly improve. Oxygen down to 2 L. May need oxygen with ambulation although may not need at rest. 4. UTI. Treated. 5. COPD listed as a medical problem, however daughter repored completely asymptomatic. Appears stable. 6. PMH CVA, dementia, CAD   Continues to improve. Change to oral antibiotics. Follow-up speech therapy recommendations. Plan transfer to skilled nursing facility today.  Discussed in detail with daughter at bedside, all questions answered.  Brendia Sacksaniel Charley Miske, MD  [facility] Direct contact: [phone] --Via amion app OR  --[email or web address]; password [email or web address]  7PM-7AM contact night coverage as above 03/27/2016, 11:51 AM  LOS: 3 days   Consultants:    Procedures:    Antimicrobials:  Azithromycin 3/12 >> 3/16  Ceftriaxone 3/12 >>  3/15  Ceftin 3/16 >> 3/20  Interval history/Subjective: Feeling okay. Breathing okay. Complains of some back pain. Eating okay.  Objective: Vitals:   03/25/16 2233 03/26/16 0554 03/26/16 1318 03/27/16 0547  BP: (!) 138/54 (!) 121/45 (!) 109/38 (!) 158/60  Pulse: 92 83 84 87  Resp: 16 16 18 18   Temp: 100 F (37.8 C) (!) 100.8 F (38.2 C) 98.5 F (36.9 C) 98.2 F (36.8 C)  TempSrc: Oral Oral Oral Oral  SpO2: 100% 99% 94% 99%  Weight:       Height:        Intake/Output Summary (Last 24 hours) at 03/27/16 1151 Last data filed at 03/27/16 0900  Gross per 24 hour  Intake              480 ml  Output                0 ml  Net              480 ml     Filed Weights   03/23/16 1031  Weight: 97.5 kg (215 lb)    Exam: Constitutional: Sitting in chair. Appears calm, comfortable.  Respiratory: Few wheezes, no rhonchi or rales. Normal respiratory effort.  Cardiovascular: Regular rate and rhythm. No murmur, rub or gallop.  Psychiatric: Grossly normal mood and affect. Speech fluent and appropriate   I have personally reviewed following labs and imaging studies:  No new laboratory studies  Scheduled Meds: . azithromycin  500 mg Oral Q24H  . cefTRIAXone (ROCEPHIN)  IV  1 g Intravenous Q24H  . donepezil  5 mg Oral QHS  . enoxaparin (LOVENOX) injection  40 mg Subcutaneous Q24H  . gabapentin  300 mg Oral Daily  . gabapentin  600 mg Oral QHS  . polyethylene glycol  17 g Oral BID  . senna  1 tablet Oral QHS   Continuous Infusions:  Principal Problem:   Lobar pneumonia (HCC) Active Problems:   Coronary atherosclerosis   Osteoarthritis   Spinal stenosis   COPD (chronic obstructive pulmonary disease) (HCC)   MCI (mild cognitive impairment)   Urinary incontinence   Acute hypoxemic respiratory failure (HCC)   LOS: 3  days

## 2016-03-27 NOTE — Discharge Planning (Addendum)
Patient IV removed.  Discharge papers given, explained and educated.  Report given to [facility], S/W Kerr-McGeelu Shittu, Charity fundraiserN.  RN assessment and VS revealed stability for DC to facility.  No FU appts needed at this time.  No scripts Ordered.  Tele-sitter DCd. Patient will be wheeled to [facility] when ready.  Family aware of DC and Room patient is going to.

## 2016-03-27 NOTE — Clinical Social Work Placement (Signed)
   CLINICAL SOCIAL WORK PLACEMENT  NOTE  Date:  03/27/2016  Patient Details  Name: Jennifer [name] MRN: [id number] Date of Birth: 08/06/1922  Clinical Social Work is seeking post-discharge placement for this patient at the Skilled  Nursing Facility level of care (*CSW will initial, date and re-position this form in  chart as items are completed):  Yes   Patient/family provided with [place] Clinical Social Work Department's list of facilities offering this level of care within the geographic area requested by the patient (or if unable, by the patient's family).  Yes   Patient/family informed of their freedom to choose among providers that offer the needed level of care, that participate in Medicare, Medicaid or managed care program needed by the patient, have an available bed and are willing to accept the patient.      Patient/family informed of [facility]'s ownership interest in [facility] and [facility], as well as of the fact that they are under no obligation to receive care at these facilities.  PASRR submitted to EDS on       PASRR number received on       Existing PASRR number confirmed on 03/25/16     FL2 transmitted to all facilities in geographic area requested by pt/family on 03/25/16     FL2 transmitted to all facilities within larger geographic area on       Patient informed that his/her managed care company has contracts with or will negotiate with certain facilities, including the following:        Yes   Patient/family informed of bed offers received.  [facility]     Physician recommends and patient chooses bed at      Patient to be [facility] on 03/27/16.  Patient to be transferred to facility by staff     Patient family notified on 03/27/16 of transfer.  Name of family member notified:  Jennifer [name]     PHYSICIAN       Additional Comment:     _______________________________________________ Karn CassisStultz, Khyle Goodell Shanaberger, LCSW 03/27/2016, 10:29 AM [phone]

## 2016-03-27 NOTE — Discharge Summary (Addendum)
Physician Discharge Summary  Jennifer [name] ZOX:[id number] DOB: Aug 31, 1922 DOA: 03/23/2016  PCP: Syliva Overman, MD  Admit date: 03/23/2016 Discharge date: 03/27/2016  Recommendations for Outpatient Follow-up:  1. Resolution of pneumonia 2. Dysphagia treatment 3. Hypoxia, wean oxygen as tolerated. May need oxygen with ambulation.      Contact information for follow-up providers    [facility] Follow up.   Contact information: [address] [place] [address] [phone]           Contact information for after-discharge care    Destination    [facility] .   Specialty:  Skilled Nursing Facility Contact information: [address] [place] [address] [phone]                 Discharge Diagnoses:  1. Sepsis secondary to right lower lobe pneumonia 2. Acute hypoxic respiratory failure 3. UTI 4. Dysphagia  Discharge Condition: Improved Disposition: Skilled nursing facility for rehabilitation  Diet recommendation:   Dysphagia 3 (Mech soft);Thin liquid   Liquid Administration via: Cup;Straw Medication Administration: Whole meds with liquid Supervision: Patient able to self feed;Full supervision/cueing for compensatory strategies Compensations: Minimize environmental distractions;Slow rate;Small sips/bites;Lingual sweep for clearance of pocketing Postural Changes: Seated upright at 90 degrees      Filed Weights   03/23/16 1031  Weight: 97.5 kg (215 lb)    History of present illness:  83 year old woman presented from home with foul-smelling urine, productive cough. Admitted for pneumonia.  Hospital Course:  Patient was admitted and treated with broad-spectrum antibiotics with gradual clinical improvement. Did meet sepsis criteria on admission. Oxygen requirement is decreasing, remains afebrile and respiratory status is improving. Given clinical improvement with ceftriaxone and azithromycin,  plan to change to oral cephalosporin. UTI was treated. Comorbidities remain stable. Dysphagia was evaluated by speech therapy. Hospitalization was uncomplicated. Individual issues as below.  1. Right lower lobe pneumonia, continues to improve. Treated with ceftriaxone and azithromycin with good response. Therefore will change to oral antibiotics in line with this. Dysphagia noted, diet recommendations noted. Given response to treatment, no reason to suspect aspiration as etiology.  Afebrile, hemodynamics stable. Change to oral antibiotics (Ceftin). 2. Dysphagia.   Further evaluation. MBS per speech therapy. Diet recommendations to follow. 3. Acute hypoxic respiratory failure secondary to above  Continues to slowly improve. Oxygen down to 2 L. May need oxygen with ambulation although may not need at rest. 4. UTI. Treated. 5. COPD listed as a medical problem, however daughter repored completely asymptomatic. Appears stable. 6. PMH CVA, dementia, CAD   Antimicrobials:  Azithromycin 3/12 >> 3/16  Ceftriaxone 3/12 >>  3/15  Ceftin 3/16 >> 3/20   Discharge Instructions   Allergies as of 03/27/2016      Reactions   Iohexol     Desc: PT ALLERGIC TO CONTRAST- SHE CAN'T BREATH   Iron    Penicillins    Has patient had a PCN reaction causing immediate rash, facial/tongue/throat swelling, SOB or lightheadedness with hypotension:  unknown Has patient had a PCN reaction causing severe rash involving mucus membranes or skin necrosis: unknown Has patient had a PCN reaction that required hospitalization unknown Has patient had a PCN reaction occurring within the last 10 years: unknown If all of the above answers are "NO", then may proceed with Cephalosporin use.      Medication List    TAKE these medications   acetaminophen 500 MG tablet Commonly known as:  TYLENOL Take 1,000 mg by  mouth 2 (two) times daily. *May take an additional dose if needed for pain   albuterol 108 (90 Base)  MCG/ACT inhaler Commonly known as:  PROVENTIL HFA;VENTOLIN HFA Inhale 2 puffs into the lungs every 6 (six) hours as needed for wheezing or shortness of breath.   cefUROXime 500 MG tablet Commonly known as:  CEFTIN Take 1 tablet (500 mg total) by mouth 2 (two) times daily with a meal. Last dose 3/20.   cetirizine 10 MG tablet Commonly known as:  ZYRTEC Take 10 mg by mouth daily.   donepezil 5 MG tablet Commonly known as:  ARICEPT TAKE 1 TABLET(5 MG) BY MOUTH AT BEDTIME   gabapentin 300 MG capsule Commonly known as:  NEURONTIN TAKE 1 CAPSULE BY MOUTH EVERY MORNING THEN TAKE 2 CAPSULES BY MOUTH EVERY NIGHT AT BEDTIME   multivitamin with minerals Tabs tablet Take 1 tablet by mouth daily.   polyethylene glycol packet Commonly known as:  MIRALAX / GLYCOLAX Take 17 g by mouth 2 (two) times daily.      Allergies  Allergen Reactions  . Iohexol      Desc: PT ALLERGIC TO CONTRAST- SHE CAN'T BREATH   . Iron   . Penicillins     Has patient had a PCN reaction causing immediate rash, facial/tongue/throat swelling, SOB or lightheadedness with hypotension:  unknown Has patient had a PCN reaction causing severe rash involving mucus membranes or skin necrosis: unknown Has patient had a PCN reaction that required hospitalization unknown Has patient had a PCN reaction occurring within the last 10 years: unknown If all of the above answers are "NO", then may proceed with Cephalosporin use.     The results of significant diagnostics from this hospitalization (including imaging, microbiology, ancillary and laboratory) are listed below for reference.    Significant Diagnostic Studies: Dg Chest 2 View  Result Date: 03/23/2016 CLINICAL DATA:  Fever for 2 days. Weakness. Altered mental status this morning. EXAM: CHEST  2 VIEW COMPARISON:  Single-view of the chest 04/22/2014 and PA and lateral chest 01/20/2013. FINDINGS: There is airspace disease in the periphery of the right lower lobe. Left  lung appears clear. Heart size is normal. Surgical clips left axilla are noted. IMPRESSION: Right lower lobe airspace disease worrisome for pneumonia or aspiration. Electronically Signed   By: Drusilla Kanner M.D.   On: 03/23/2016 11:17    Microbiology: Recent Results (from the past 240 hour(s))  Blood culture (routine x 2)     Status: Abnormal   Collection Time: 03/23/16 10:58 AM  Result Value Ref Range Status   Specimen Description BLOOD RIGHT HAND  Final   Special Requests BOTTLES DRAWN AEROBIC AND ANAEROBIC [facility] EACH  Final   Culture  Setup Time   Final    GRAM POSITIVE COCCI Gram Stain Report Called to,Read Back By and Verified With: GAVIN,B. AT 0513 ON 03/24/2016 BY EVA ANAEROBIC BOTTLE ONLY Performed at [facility] CRITICAL RESULT CALLED TO, READ BACK BY AND VERIFIED WITH: PHARMD S HALL [id number] MLM    Culture (A)  Final    STAPHYLOCOCCUS SPECIES (COAGULASE NEGATIVE) THE SIGNIFICANCE OF ISOLATING THIS ORGANISM FROM A SINGLE SET OF BLOOD CULTURES WHEN MULTIPLE SETS ARE DRAWN IS UNCERTAIN. PLEASE NOTIFY THE MICROBIOLOGY DEPARTMENT WITHIN ONE WEEK IF SPECIATION AND SENSITIVITIES ARE REQUIRED. Performed at [facility], [address], [place], [place] [address]    Report Status 03/26/2016 FINAL  Final  Blood Culture ID Panel (Reflexed)     Status: Abnormal  Collection Time: 03/23/16 10:58 AM  Result Value Ref Range Status   Enterococcus species NOT DETECTED NOT DETECTED Final   Listeria monocytogenes NOT DETECTED NOT DETECTED Final   Staphylococcus species DETECTED (A) NOT DETECTED Final    Comment: Methicillin (oxacillin) resistant coagulase negative staphylococcus. Possible blood culture contaminant (unless isolated from more than one blood culture draw or clinical case suggests pathogenicity). No antibiotic treatment is indicated for blood  culture contaminants. CRITICAL RESULT CALLED TO, READ BACK BY AND VERIFIED WITH: PHARMD S HALL [id number] MLM     Staphylococcus aureus NOT DETECTED NOT DETECTED Final   Methicillin resistance DETECTED (A) NOT DETECTED Final    Comment: CRITICAL RESULT CALLED TO, READ BACK BY AND VERIFIED WITH: PHARMD S HALL [id number] MLM    Streptococcus species NOT DETECTED NOT DETECTED Final   Streptococcus agalactiae NOT DETECTED NOT DETECTED Final   Streptococcus pneumoniae NOT DETECTED NOT DETECTED Final   Streptococcus pyogenes NOT DETECTED NOT DETECTED Final   Acinetobacter baumannii NOT DETECTED NOT DETECTED Final   Enterobacteriaceae species NOT DETECTED NOT DETECTED Final   Enterobacter cloacae complex NOT DETECTED NOT DETECTED Final   Escherichia coli NOT DETECTED NOT DETECTED Final   Klebsiella oxytoca NOT DETECTED NOT DETECTED Final   Klebsiella pneumoniae NOT DETECTED NOT DETECTED Final   Proteus species NOT DETECTED NOT DETECTED Final   Serratia marcescens NOT DETECTED NOT DETECTED Final   Haemophilus influenzae NOT DETECTED NOT DETECTED Final   Neisseria meningitidis NOT DETECTED NOT DETECTED Final   Pseudomonas aeruginosa NOT DETECTED NOT DETECTED Final   Candida albicans NOT DETECTED NOT DETECTED Final   Candida glabrata NOT DETECTED NOT DETECTED Final   Candida krusei NOT DETECTED NOT DETECTED Final   Candida parapsilosis NOT DETECTED NOT DETECTED Final   Candida tropicalis NOT DETECTED NOT DETECTED Final    Comment: Performed at [facility], [address] [place], [place] [address]  Blood culture (routine x 2)     Status: None (Preliminary result)   Collection Time: 03/23/16 11:05 AM  Result Value Ref Range Status   Specimen Description LEFT ANTECUBITAL  Final   Special Requests BOTTLES DRAWN AEROBIC AND ANAEROBIC 8CC EACH  Final   Culture NO GROWTH 4 DAYS  Final   Report Status PENDING  Incomplete  Urine culture     Status: Abnormal   Collection Time: 03/23/16 12:18 PM  Result Value Ref Range Status   Specimen Description URINE, CATHETERIZED  Final   Special Requests NONE   Final   Culture >=100,000 COLONIES/mL ESCHERICHIA COLI (A)  Final   Report Status 03/25/2016 FINAL  Final   Organism ID, Bacteria ESCHERICHIA COLI (A)  Final      Susceptibility   Escherichia coli - MIC*    AMPICILLIN 16 INTERMEDIATE Intermediate     CEFAZOLIN <=4 SENSITIVE Sensitive     CEFTRIAXONE <=1 SENSITIVE Sensitive     CIPROFLOXACIN <=0.25 SENSITIVE Sensitive     GENTAMICIN <=1 SENSITIVE Sensitive     IMIPENEM <=0.25 SENSITIVE Sensitive     NITROFURANTOIN <=16 SENSITIVE Sensitive     TRIMETH/SULFA <=20 SENSITIVE Sensitive     AMPICILLIN/SULBACTAM 4 SENSITIVE Sensitive     PIP/TAZO <=4 SENSITIVE Sensitive     Extended ESBL NEGATIVE Sensitive     * >=100,000 COLONIES/mL ESCHERICHIA COLI     Labs: Basic Metabolic Panel:  Recent Labs Lab 03/23/16 1058  NA 141  K 3.5  CL 104  CO2 24  GLUCOSE 142*  BUN  18  CREATININE 0.96  CALCIUM 8.9   Liver Function Tests:  Recent Labs Lab 03/23/16 1058  AST 69*  ALT 45  ALKPHOS 141*  BILITOT 1.2  PROT 8.0  ALBUMIN 3.6   CBC:  Recent Labs Lab 03/23/16 1111  WBC 13.5*  NEUTROABS 11.4*  HGB 12.9  HCT 40.7  MCV 96.0  PLT 188    CBG:  Recent Labs Lab 03/25/16 1737 03/26/16 1222  GLUCAP 72 145*    Principal Problem:   Lobar pneumonia (HCC) Active Problems:   Coronary atherosclerosis   Osteoarthritis   Spinal stenosis   COPD (chronic obstructive pulmonary disease) (HCC)   MCI (mild cognitive impairment)   Urinary incontinence   Acute hypoxemic respiratory failure (HCC)   Time coordinating discharge: 35 minutes  Signed:  Brendia Sacks, MD [facility] 03/27/2016, 12:00 PM

## 2016-03-30 ENCOUNTER — Non-Acute Institutional Stay (SKILLED_NURSING_FACILITY): Payer: Medicare Other | Admitting: Internal Medicine

## 2016-03-30 ENCOUNTER — Encounter (HOSPITAL_COMMUNITY)
Admission: RE | Admit: 2016-03-30 | Discharge: 2016-03-30 | Disposition: A | Discharge status: Home / Self Care | Payer: Medicare Other | Source: Skilled Nursing Facility | Attending: Internal Medicine | Admitting: Internal Medicine

## 2016-03-30 ENCOUNTER — Encounter: Payer: Self-pay | Admitting: Internal Medicine

## 2016-03-30 DIAGNOSIS — M158 Other polyosteoarthritis: Secondary | ICD-10-CM

## 2016-03-30 DIAGNOSIS — N3000 Acute cystitis without hematuria: Secondary | ICD-10-CM

## 2016-03-30 DIAGNOSIS — R6889 Other general symptoms and signs: Secondary | ICD-10-CM | POA: Insufficient documentation

## 2016-03-30 DIAGNOSIS — J181 Lobar pneumonia, unspecified organism: Secondary | ICD-10-CM

## 2016-03-30 DIAGNOSIS — A4189 Other specified sepsis: Secondary | ICD-10-CM | POA: Insufficient documentation

## 2016-03-30 DIAGNOSIS — J449 Chronic obstructive pulmonary disease, unspecified: Secondary | ICD-10-CM | POA: Diagnosis not present

## 2016-03-30 DIAGNOSIS — R7989 Other specified abnormal findings of blood chemistry: Secondary | ICD-10-CM | POA: Insufficient documentation

## 2016-03-30 LAB — CULTURE, BLOOD (ROUTINE X 2): Culture: NO GROWTH

## 2016-03-30 LAB — CBC
HEMATOCRIT: 34.7 % — AB (ref 36.0–46.0)
Hemoglobin: 11 g/dL — ABNORMAL LOW (ref 12.0–15.0)
MCH: 30.6 pg (ref 26.0–34.0)
MCHC: 31.7 g/dL (ref 30.0–36.0)
MCV: 96.4 fL (ref 78.0–100.0)
Platelets: 338 10*3/uL (ref 150–400)
RBC: 3.6 MIL/uL — ABNORMAL LOW (ref 3.87–5.11)
RDW: 14.7 % (ref 11.5–15.5)
WBC: 7.1 10*3/uL (ref 4.0–10.5)

## 2016-03-30 LAB — BASIC METABOLIC PANEL
ANION GAP: 8 (ref 5–15)
BUN: 23 mg/dL — ABNORMAL HIGH (ref 6–20)
CALCIUM: 8.4 mg/dL — AB (ref 8.9–10.3)
CO2: 34 mmol/L — AB (ref 22–32)
CREATININE: 0.84 mg/dL (ref 0.44–1.00)
Chloride: 101 mmol/L (ref 101–111)
GFR calc non Af Amer: 59 mL/min — ABNORMAL LOW (ref >60)
Glucose, Bld: 89 mg/dL (ref 65–99)
Potassium: 4.2 mmol/L (ref 3.5–5.1)
SODIUM: 143 mmol/L (ref 135–145)

## 2016-03-30 NOTE — Progress Notes (Signed)
Provider:  Einar Crow Location:   [facility] Room Number: 123/P Place of Service:  SNF (31)  PCP: Syliva Overman, MD Patient Care Team: [name], MD as PCP - General Vickki Hearing, MD as Consulting Physician (Orthopedic Surgery)  Extended Emergency Contact Information Primary Emergency Contact: Ware,Gwyndolyn Address: [address]          [place], [place] [address] [place] Home Phone: [phone] Mobile Phone: [phone] Relation: Daughter Secondary Emergency Contact: Simonne Martinet Address: [address]          [place], [place] [address] [place] Home Phone: [phone] Mobile Phone: [phone] Relation: Grandson  Code Status: DNR Goals of Care: Advanced Directive information Advanced Directives 03/30/2016  Does Patient Have a Medical Advance Directive? Yes  Type of Advance Directive Out of facility DNR (pink MOST or yellow form)  Does patient want to make changes to medical advance directive? No - Patient declined  Copy of Healthcare Power of Attorney in Chart? -      Chief Complaint  Patient presents with  . New Admit To SNF    HPI: Patient is a 83 y.o. female seen today for admission to SNF for therapy.  Patient has H/O CVA, COPD ,CAD and Dementia.and arthritis. Patient was having fever and worsening Urinary Incontinence and SOB at home. Her family brought her into the hospital where she was found to have RLL Pneumonia and UTI. She was treatred with Antibiotics. She was also started on Dysphagic diet.Patient improved but was too weak to go home She was discarged to Facility for further therapy.  Patient lives with her Daughter. She was sleeping when I went to her room. She only c/o knee pain due to her arthritis. Denies and SOB,Cough , Fever or chills.    Past Medical History:  Diagnosis Date  . Anemia    iron deficiency post op.  Marland Kitchen CAD (coronary artery disease) 2008   Cath 40% mid LAD, 35%  RCA  . Cellulitis of finger    right   . Chest pain 2006  . Chronic back pain   . COPD (chronic obstructive pulmonary disease) (HCC)   . Gastroesophageal reflux disease   . Hiatal hernia   . HTN (hypertension) 04/22/2014  . Hyperlipidemia   . Nicotine addiction   . Osteoarthritis    status post left TKA; surgery on the right is anticipated in the near future   . Pulmonary nodules    stable since 2006  . Shingles    right breast   . Skin infection   . Ulcer (HCC)    gential  . Use of cane as ambulatory aid    Past Surgical History:  Procedure Laterality Date  . ABDOMINAL HYSTERECTOMY    . BREAST SURGERY N/A    bilateral  . CARDIAC CATHETERIZATION  2008   Mid LAD 40%, RCA 25%.   . CHOLECYSTECTOMY    . COMBINED HYSTERECTOMY ABDOMINAL W/ A&P REPAIR / OOPHORECTOMY  approx. 40 years ago   . EYE SURGERY    . MASTECTOMY  1998 & 2007    right -1998 / left 2007  . right knee replacement  09/2009   Dr. Romeo Apple  . TOTAL KNEE ARTHROPLASTY  4/08   left     reports that she quit smoking about 12 months ago. Her smoking use included Cigarettes. She has a 17.50 pack-year smoking history. She has never used smokeless tobacco. She reports that she does not drink alcohol  or use drugs. Social History   Social History  . Marital status: Widowed    Spouse name: N/A  . Number of children: 2  . Years of education: 12th grade   Occupational History  . retired from [employer] in 1986    Social History Main Topics  . Smoking status: Former Smoker    Packs/day: 0.25    Years: 70.00    Types: Cigarettes    Quit date: 03/13/2015  . Smokeless tobacco: Never Used  . Alcohol use No  . Drug use: No  . Sexual activity: No   Other Topics Concern  . Not on file   Social History Narrative  . No narrative on file    Functional Status Survey:    Family History  Problem Relation Age of Onset  . Cancer Mother 7362    breast   . Cancer Father 5375    prostate  . Hypertension Father   .  Hypertension Sister   . Heart disease Sister   . Heart disease Sister   . Heart disease Sister     Health Maintenance  Topic Date Due  . TETANUS/TDAP  05/27/2017 (Originally 09/23/2013)  . INFLUENZA VACCINE  Completed  . DEXA SCAN  Completed  . PNA vac Low Risk Adult  Completed    Allergies  Allergen Reactions  . Iohexol      Desc: PT ALLERGIC TO CONTRAST- SHE CAN'T BREATH   . Iron   . Penicillins     Has patient had a PCN reaction causing immediate rash, facial/tongue/throat swelling, SOB or lightheadedness with hypotension:  unknown Has patient had a PCN reaction causing severe rash involving mucus membranes or skin necrosis: unknown Has patient had a PCN reaction that required hospitalization unknown Has patient had a PCN reaction occurring within the last 10 years: unknown If all of the above answers are "NO", then may proceed with Cephalosporin use.     Current Outpatient Prescriptions on File Prior to Visit  Medication Sig Dispense Refill  . acetaminophen (TYLENOL) 500 MG tablet Take 1,000 mg by mouth 2 (two) times daily. *May take an additional dose if needed for pain    . albuterol (PROVENTIL HFA;VENTOLIN HFA) 108 (90 BASE) MCG/ACT inhaler Inhale 2 puffs into the lungs every 6 (six) hours as needed for wheezing or shortness of breath. 1 Inhaler 2  . cefUROXime (CEFTIN) 500 MG tablet Take 1 tablet (500 mg total) by mouth 2 (two) times daily with a meal. Last dose 3/20.    Marland Kitchen. cetirizine (ZYRTEC) 10 MG tablet Take 10 mg by mouth daily.    Marland Kitchen. donepezil (ARICEPT) 5 MG tablet TAKE 1 TABLET(5 MG) BY MOUTH AT BEDTIME 90 tablet 1  . gabapentin (NEURONTIN) 300 MG capsule TAKE 1 CAPSULE BY MOUTH EVERY MORNING THEN TAKE 2 CAPSULES BY MOUTH EVERY NIGHT AT BEDTIME 270 capsule 1  . polyethylene glycol (MIRALAX / GLYCOLAX) packet Take 17 g by mouth 2 (two) times daily.     No current facility-administered medications on file prior to visit.     Review of Systems  Review of Systems    Constitutional: Negative for activity change, appetite change, chills, diaphoresis, fatigue and fever.  HENT: Negative for mouth sores, postnasal drip, rhinorrhea, sinus pain and sore throat.   Respiratory: Negative for apnea, cough, chest tightness, shortness of breath and wheezing.   Cardiovascular: Negative for chest pain, palpitations and leg swelling.  Gastrointestinal: Negative for abdominal distention, abdominal pain, constipation, diarrhea, nausea and vomiting.  Genitourinary: Negative for dysuria and frequency.  Musculoskeletal: Positive for arthralgias, joint swelling and myalgias.  Skin: Negative for rash.  Neurological: Negative for dizziness, syncope, weakness, light-headedness and numbness.  Psychiatric/Behavioral: Negative for behavioral problems, confusion and sleep disturbance.    Vitals:   03/30/16 1433  BP: 137/74  Pulse: 98  Resp: 20  Temp: 98.2 F (36.8 C)  TempSrc: Oral  SpO2: 99%   There is no height or weight on file to calculate BMI. Physical Exam  Constitutional: She appears well-developed and well-nourished.  HENT:  Head: Normocephalic.  Mouth/Throat: Oropharynx is clear and moist.  Eyes: Pupils are equal, round, and reactive to light.  Neck: Neck supple.  Cardiovascular: Normal rate, regular rhythm and normal heart sounds.   Pulmonary/Chest: Effort normal and breath sounds normal.  Bilateral Rales.  Abdominal: Soft. Bowel sounds are normal. She exhibits no distension. There is no tenderness. There is no rebound.  Musculoskeletal: She exhibits edema.  Neurological: She is alert.  Unable to do detail exam as patient was not coooperative  Skin: Skin is warm and dry.  Psychiatric: She has a normal mood and affect. Her behavior is normal. Thought content normal.    Labs reviewed: Basic Metabolic Panel:  Recent Labs  16/10/96 1057 03/23/16 1058 03/30/16 0615  NA 143 141 143  K 3.3* 3.5 4.2  CL 106 104 101  CO2 31 24 34*  GLUCOSE 88 142* 89   BUN 21* 18 23*  CREATININE 0.90 0.96 0.84  CALCIUM 8.7* 8.9 8.4*   Liver Function Tests:  Recent Labs  10/20/15 1057 03/23/16 1058  AST 22 69*  ALT 18 45  ALKPHOS 86 141*  BILITOT 0.4 1.2  PROT 6.9 8.0  ALBUMIN 3.4* 3.6    Recent Labs  10/20/15 1057  LIPASE 16   No results for input(s): AMMONIA in the last 8760 hours. CBC:  Recent Labs  10/20/15 1057 03/23/16 1111 03/30/16 0615  WBC 4.9 13.5* 7.1  NEUTROABS  --  11.4*  --   HGB 12.6 12.9 11.0*  HCT 41.1 40.7 34.7*  MCV 96.3 96.0 96.4  PLT 214 188 338   Cardiac Enzymes: No results for input(s): CKTOTAL, CKMB, CKMBINDEX, TROPONINI in the last 8760 hours. BNP: Invalid input(s): POCBNP Lab Results  Component Value Date   HGBA1C 5.6 06/08/2014   Lab Results  Component Value Date   TSH 1.68 03/20/2015   Lab Results  Component Value Date   VITAMINB12 423 04/16/2014   No results found for: FOLATE Lab Results  Component Value Date   IRON 30 (L) 04/16/2014   FERRITIN 21 04/16/2014    Imaging and Procedures obtained prior to SNF admission: No results found.  Assessment/Plan Lobar pneumonia [facility]) Patient is afebrile. Continue Antibiotics course. POX QD.  COPD.  Few Rales on Exam . Will start her on Duo Nebs for few days.  UTI Will continue Cefuroxime  Osteoarthritis Restart her Tramadol as she was taking that at home.  Dysphagia Patient was evaluated by speech therapy in the hospital. Do not have results for MBS yet. Patient is on Modified diet Will follow up with Therapist.  Generalized weakness Will start therapy. Possible discharge home with Daughter.  Family/ staff Communication:   Labs/tests ordered: CBC and CMP.

## 2016-03-31 ENCOUNTER — Other Ambulatory Visit: Payer: Self-pay | Admitting: *Deleted

## 2016-03-31 MED ORDER — TRAMADOL HCL 50 MG PO TABS: ORAL_TABLET | ORAL | 0 refills | Status: DC

## 2016-03-31 NOTE — Telephone Encounter (Signed)
[facility] #[phone] Fax: [phone]   

## 2016-04-06 ENCOUNTER — Encounter (HOSPITAL_COMMUNITY)
Admission: RE | Admit: 2016-04-06 | Discharge: 2016-04-06 | Disposition: A | Discharge status: Home / Self Care | Payer: Medicare Other | Source: Skilled Nursing Facility | Attending: Internal Medicine | Admitting: Internal Medicine

## 2016-04-06 DIAGNOSIS — R6889 Other general symptoms and signs: Secondary | ICD-10-CM | POA: Insufficient documentation

## 2016-04-06 DIAGNOSIS — R7989 Other specified abnormal findings of blood chemistry: Secondary | ICD-10-CM | POA: Insufficient documentation

## 2016-04-06 DIAGNOSIS — E785 Hyperlipidemia, unspecified: Secondary | ICD-10-CM | POA: Insufficient documentation

## 2016-04-06 LAB — COMPREHENSIVE METABOLIC PANEL
ALBUMIN: 2.5 g/dL — AB (ref 3.5–5.0)
ALT: 23 U/L (ref 14–54)
AST: 25 U/L (ref 15–41)
Alkaline Phosphatase: 101 U/L (ref 38–126)
Anion gap: 6 (ref 5–15)
BILIRUBIN TOTAL: 0.5 mg/dL (ref 0.3–1.2)
BUN: 12 mg/dL (ref 6–20)
CHLORIDE: 104 mmol/L (ref 101–111)
CO2: 30 mmol/L (ref 22–32)
Calcium: 8.6 mg/dL — ABNORMAL LOW (ref 8.9–10.3)
Creatinine, Ser: 0.69 mg/dL (ref 0.44–1.00)
GFR calc Af Amer: >60 mL/min (ref >60)
GFR calc non Af Amer: >60 mL/min (ref >60)
GLUCOSE: 84 mg/dL (ref 65–99)
POTASSIUM: 4 mmol/L (ref 3.5–5.1)
Sodium: 140 mmol/L (ref 135–145)
Total Protein: 6.3 g/dL — ABNORMAL LOW (ref 6.5–8.1)

## 2016-04-06 LAB — CBC WITH DIFFERENTIAL/PLATELET
BASOS PCT: 0 %
Basophils Absolute: 0 10*3/uL (ref 0.0–0.1)
Eosinophils Absolute: 0.2 10*3/uL (ref 0.0–0.7)
Eosinophils Relative: 4 %
HCT: 35.9 % — ABNORMAL LOW (ref 36.0–46.0)
HEMOGLOBIN: 11 g/dL — AB (ref 12.0–15.0)
Lymphocytes Relative: 29 %
Lymphs Abs: 1.3 10*3/uL (ref 0.7–4.0)
MCH: 30 pg (ref 26.0–34.0)
MCHC: 30.6 g/dL (ref 30.0–36.0)
MCV: 97.8 fL (ref 78.0–100.0)
MONOS PCT: 11 %
Monocytes Absolute: 0.5 10*3/uL (ref 0.1–1.0)
NEUTROS ABS: 2.6 10*3/uL (ref 1.7–7.7)
NEUTROS PCT: 56 %
Platelets: 382 10*3/uL (ref 150–400)
RBC: 3.67 MIL/uL — AB (ref 3.87–5.11)
RDW: 15.3 % (ref 11.5–15.5)
WBC: 4.6 10*3/uL (ref 4.0–10.5)

## 2016-04-15 ENCOUNTER — Encounter: Payer: Self-pay | Admitting: Internal Medicine

## 2016-04-15 ENCOUNTER — Non-Acute Institutional Stay (SKILLED_NURSING_FACILITY): Payer: Medicare Other | Admitting: Internal Medicine

## 2016-04-15 DIAGNOSIS — J449 Chronic obstructive pulmonary disease, unspecified: Secondary | ICD-10-CM

## 2016-04-15 DIAGNOSIS — M158 Other polyosteoarthritis: Secondary | ICD-10-CM

## 2016-04-15 DIAGNOSIS — J181 Lobar pneumonia, unspecified organism: Secondary | ICD-10-CM

## 2016-04-15 DIAGNOSIS — J189 Pneumonia, unspecified organism: Secondary | ICD-10-CM

## 2016-04-15 DIAGNOSIS — N3000 Acute cystitis without hematuria: Secondary | ICD-10-CM

## 2016-04-15 NOTE — Progress Notes (Signed)
Location:   [facility] Room Number: 123/P Place of Service:  SNF (31)  Provider: Edmon Crape  PCP: Syliva Overman, MD Patient Care Team: [name], MD as PCP - General Vickki Hearing, MD as Consulting Physician (Orthopedic Surgery)  Extended Emergency Contact Information Primary Emergency Contact: Ware,Gwyndolyn Address: [address]          [place], [place] [address] Darden [place] of [place] Home Phone: [phone] Mobile Phone: [phone] Relation: Daughter Secondary Emergency Contact: Simonne Martinet Address: [address]          [place], [place] [address] Darden [place] of [place] Home Phone: [phone] Mobile Phone: [phone] Relation: Grandson  Code Status: DNR Goals of care:  Advanced Directive information Advanced Directives 04/15/2016  Does Patient Have a Medical Advance Directive? Yes  Type of Advance Directive Out of facility DNR (pink MOST or yellow form)  Does patient want to make changes to medical advance directive? No - Patient declined  Copy of Healthcare Power of Attorney in Chart? -     Allergies  Allergen Reactions  . Iohexol      Desc: PT ALLERGIC TO CONTRAST- SHE CAN'T BREATH   . Iron   . Penicillins     Has patient had a PCN reaction causing immediate rash, facial/tongue/throat swelling, SOB or lightheadedness with hypotension:  unknown Has patient had a PCN reaction causing severe rash involving mucus membranes or skin necrosis: unknown Has patient had a PCN reaction that required hospitalization unknown Has patient had a PCN reaction occurring within the last 10 years: unknown If all of the above answers are "NO", then may proceed with Cephalosporin use.     Chief Complaint  Patient presents with  . Discharge Note    HPI:  83 y.o. female  seen today for discharge from facility later this week.  She was here for rehabilitation after hospitalization for weakness and shortness of breath.  She was found  to have a right lower lobe pneumonia as well as a UTI which were treated with antibiotics were switched to by mouth form on discharge and she has completed this.  She has been afebrile and appears to be stable in this regards.  She was here essentially to gain strength and has worked with therapy and apparently has gained strength in apparently is back close to her baseline although still quite weak and relatively immobile-not really ambulating to any significant extent.  She will be going home with her daughter who is very supportive.  Her other medical issues appear to be relatively stable she does have some history COPD which apparently is been largely asymptomatic she did receive a short course of DuoNeb's here but according in nursing does not really need these any longer she is on Proventil inhaler.  She also has a history of osteoarthritis she continues on ibuprofen when necessary had received a short course of tramadol as well.  In regards to other medical issues she does have a history of dementia she is on Aricept she does well with supportive care.  In regards neuropathy she continues on Neurontin.  She is also on Zyrtec for a history apparently of chronic allergy symptoms.  Currently she has no complaints she is sitting in her wheelchair comfortably in her only issue is she wants to go home as soon as possible     Past Medical History:  Diagnosis Date  . Anemia    iron deficiency post op.  Marland Kitchen CAD (coronary artery disease) 2008  Cath 40% mid LAD, 35% RCA  . Cellulitis of finger    right   . Chest pain 2006  . Chronic back pain   . COPD (chronic obstructive pulmonary disease) (HCC)   . Gastroesophageal reflux disease   . Hiatal hernia   . HTN (hypertension) 04/22/2014  . Hyperlipidemia   . Nicotine addiction   . Osteoarthritis    status post left TKA; surgery on the right is anticipated in the near future   . Pulmonary nodules    stable since 2006  . Shingles     right breast   . Skin infection   . Ulcer (HCC)    gential  . Use of cane as ambulatory aid     Past Surgical History:  Procedure Laterality Date  . ABDOMINAL HYSTERECTOMY    . BREAST SURGERY N/A    bilateral  . CARDIAC CATHETERIZATION  2008   Mid LAD 40%, RCA 25%.   . CHOLECYSTECTOMY    . COMBINED HYSTERECTOMY ABDOMINAL W/ A&P REPAIR / OOPHORECTOMY  approx. 40 years ago   . EYE SURGERY    . MASTECTOMY  1998 & 2007    right -1998 / left 2007  . right knee replacement  09/2009   Dr. Romeo Apple  . TOTAL KNEE ARTHROPLASTY  4/08   left       reports that she quit smoking about 13 months ago. Her smoking use included Cigarettes. She has a 17.50 pack-year smoking history. She has never used smokeless tobacco. She reports that she does not drink alcohol or use drugs. Social History   Social History  . Marital status: Widowed    Spouse name: N/A  . Number of children: 2  . Years of education: 12th grade   Occupational History  . retired from [employer] in 1986    Social History Main Topics  . Smoking status: Former Smoker    Packs/day: 0.25    Years: 70.00    Types: Cigarettes    Quit date: 03/13/2015  . Smokeless tobacco: Never Used  . Alcohol use No  . Drug use: No  . Sexual activity: No   Other Topics Concern  . Not on file   Social History Narrative  . No narrative on file   Functional Status Survey:    Allergies  Allergen Reactions  . Iohexol      Desc: PT ALLERGIC TO CONTRAST- SHE CAN'T BREATH   . Iron   . Penicillins     Has patient had a PCN reaction causing immediate rash, facial/tongue/throat swelling, SOB or lightheadedness with hypotension:  unknown Has patient had a PCN reaction causing severe rash involving mucus membranes or skin necrosis: unknown Has patient had a PCN reaction that required hospitalization unknown Has patient had a PCN reaction occurring within the last 10 years: unknown If all of the above answers are "NO", then may proceed with  Cephalosporin use.     Pertinent  Health Maintenance Due  Topic Date Due  . INFLUENZA VACCINE  08/12/2016  . DEXA SCAN  Completed  . PNA vac Low Risk Adult  Completed    Medications: Current Outpatient Prescriptions on File Prior to Visit  Medication Sig Dispense Refill  . acetaminophen (TYLENOL) 500 MG tablet Take 1,000 mg by mouth 2 (two) times daily. *May take an additional dose if needed for pain    . albuterol (PROVENTIL HFA;VENTOLIN HFA) 108 (90 BASE) MCG/ACT inhaler Inhale 2 puffs into the lungs every 6 (six) hours as needed  for wheezing or shortness of breath. 1 Inhaler 2  . cetirizine (ZYRTEC) 10 MG tablet Take 10 mg by mouth daily.    Marland Kitchen donepezil (ARICEPT) 5 MG tablet TAKE 1 TABLET(5 MG) BY MOUTH AT BEDTIME 90 tablet 1  . gabapentin (NEURONTIN) 300 MG capsule TAKE 1 CAPSULE BY MOUTH EVERY MORNING THEN TAKE 2 CAPSULES BY MOUTH EVERY NIGHT AT BEDTIME 270 capsule 1  . OXYGEN Give 2 L/mn via to keep o2 sat greater than 90%    . polyethylene glycol (MIRALAX / GLYCOLAX) packet Take 17 g by mouth 2 (two) times daily.     No current facility-administered medications on file prior to visit.     Review of Systems   In general she does not complaining of any fever or chills has had some weight gain about 7 pounds since her admission apparently eats fairly well.  Skin does not complain of rashes or itching.  Head ears eyes nose mouth and throat does not complain of any sore throat or visual changes.  Respiratory denies any increase shortness of breath from baseline cough.  Cardiac does not have chest pain has lower extremity edema also some edema of her arms per nursing this is fairly chronic.  GIs does not complaining be nausea vomiting diarrhea constipation or abdominal pain.  Muscle skeletal continues to complain at times of diffuse arthritic pain more so over knees she says this is chronic. Continues to have significant weakness and ambulation difficulties  Neurologic is  not complaining of dizziness headache or syncope.  Psych does not complain of her anxiety or depression she just wants to go home.    Vitals:   04/15/16 1437  BP: (!) 145/85  Pulse: 92  Resp: (!) 24  Temp: 98.3 F (36.8 C)  TempSrc: Oral  SpO2: 92%  Weight: 219 lb (99.3 kg)  Height: 6' (1.829 m)   Body mass index is 29.7 kg/m. Physical Exam   In general this is ant obese elderly female in no distress sitting comfortably in her wheelchair.  Her skin is warm and dry.  Oropharynx clear mucous membranes moist.  Eyes visual acuity appears grossly intact she has prescription lenses apparently the left lens is missing.  Chest shallow air entry but no labored breathing I cannot really appreciate any congestion.  Heart is regular rate and rhythm without murmur gallop or rub.  Abdomen is obese soft nontender with slightly hypoactive bowel sounds.  Muscle skeletal has some lower extremity weakness continued has arthritic changes to her legs knees bilaterally.--Still has difficulty with transfers secondary to weakness and obesity     Neurologic is grossly intact no lateralizing findings her speech clear.  Psych she is oriented to self can carry on a simple conversation her dementia appears to be mild-to-moderate.    Labs reviewed: Basic Metabolic Panel:  Recent Labs  16/10/96 1058 03/30/16 0615 04/06/16 0700  NA 141 143 140  K 3.5 4.2 4.0  CL 104 101 104  CO2 24 34* 30  GLUCOSE 142* 89 84  BUN 18 23* 12  CREATININE 0.96 0.84 0.69  CALCIUM 8.9 8.4* 8.6*   Liver Function Tests:  Recent Labs  10/20/15 1057 03/23/16 1058 04/06/16 0700  AST 22 69* 25  ALT 18 45 23  ALKPHOS 86 141* 101  BILITOT 0.4 1.2 0.5  PROT 6.9 8.0 6.3*  ALBUMIN 3.4* 3.6 2.5*    Recent Labs  10/20/15 1057  LIPASE 16   No results for input(s): AMMONIA in the  last 8760 hours. CBC:  Recent Labs  03/23/16 1111 03/30/16 0615 04/06/16 0700  WBC 13.5* 7.1 4.6  NEUTROABS 11.4*   --  2.6  HGB 12.9 11.0* 11.0*  HCT 40.7 34.7* 35.9*  MCV 96.0 96.4 97.8  PLT 188 338 382   Cardiac Enzymes: No results for input(s): CKTOTAL, CKMB, CKMBINDEX, TROPONINI in the last 8760 hours. BNP: Invalid input(s): POCBNP CBG:  Recent Labs  03/25/16 1737 03/26/16 1222  GLUCAP 72 145*    Procedures and Imaging Studies During Stay: Dg Chest 2 View  Result Date: 03/23/2016 CLINICAL DATA:  Fever for 2 days. Weakness. Altered mental status this morning. EXAM: CHEST  2 VIEW COMPARISON:  Single-view of the chest 04/22/2014 and PA and lateral chest 01/20/2013. FINDINGS: There is airspace disease in the periphery of the right lower lobe. Left lung appears clear. Heart size is normal. Surgical clips left axilla are noted. IMPRESSION: Right lower lobe airspace disease worrisome for pneumonia or aspiration. Electronically Signed   By: Drusilla Kanner M.D.   On: 03/23/2016 11:17    Assessment/Plan:    1 history of right lower lobe pneumonia this appears relatively asymptomatic no longer uses her nebulizer she does have a history of COPD continues on Proventil inhalers-she no longer requires oxygen she appears to have done well in this regards.  #2 UTI again has completed antibiotic appears to be asymptomatic.  #3 history of dementia but she appears to do well with supportive care she will be living with her daughter she is on Aricept.  #4 history of osteoarthritis continues on ibuprofen when necessary had receive tramadol at times earlier in her stay. Now receiving Tylenol apparently this is helping per staff she still has significant weakness and difficulty ambulating- will need a mechanical left secondary to her relative immobility and weakness complicated with advanced age and some obesity  #5 history allergic rhinitis continues on Zyrtec this appears relatively asymptomatic currently.  #6 history of neuropathy continues on Neurontin.  #7 history of iron deficiency anemia she has a  listed allergy to iron hemoglobin appears relatively stable at 11.0 on lab done 04/06/2016 will update this before discharge.  #8 hypertension? She has at times systolics in the 140s she is not on any hypertension medicines will defer to primary care provider would be hesitant any kind of medication for risk of hypotension upon discharge with her advanced age I suspect would be somewhat conservative in managing her blood pressure  #9 coronary artery disease this appears asymptomatic she has had some weight gain apparently her appetite is fairly good-has had had some chronic edema as well will order a BNP along with her lab tomorrow-I do not see a significant history of CHF -- echo done in 2006 appear to show ejection fraction of 65-75%   Again she will be going home with her daughter was very supportive will continue PT and OT for strengthening as well as nursing for multiple medical issues. Again will need a mechanical lift as well secondary to immobility as noted above  Will update a CBC and metabolic panel and BNP in the morning.    [id number] note greater than 30 minutes spent on this discharge summary-greater than 50% of time spent coordinating plan of care for numerous diagnoses

## 2016-04-16 ENCOUNTER — Telehealth: Payer: Self-pay | Admitting: Family Medicine

## 2016-04-16 ENCOUNTER — Encounter (HOSPITAL_COMMUNITY)
Admission: RE | Admit: 2016-04-16 | Discharge: 2016-04-16 | Disposition: A | Discharge status: Home / Self Care | Payer: Medicare Other | Source: Skilled Nursing Facility | Attending: Internal Medicine | Admitting: Internal Medicine

## 2016-04-16 DIAGNOSIS — J9691 Respiratory failure, unspecified with hypoxia: Secondary | ICD-10-CM | POA: Insufficient documentation

## 2016-04-16 DIAGNOSIS — J449 Chronic obstructive pulmonary disease, unspecified: Secondary | ICD-10-CM | POA: Insufficient documentation

## 2016-04-16 DIAGNOSIS — K219 Gastro-esophageal reflux disease without esophagitis: Secondary | ICD-10-CM | POA: Insufficient documentation

## 2016-04-16 DIAGNOSIS — D509 Iron deficiency anemia, unspecified: Secondary | ICD-10-CM | POA: Insufficient documentation

## 2016-04-16 DIAGNOSIS — J189 Pneumonia, unspecified organism: Secondary | ICD-10-CM | POA: Insufficient documentation

## 2016-04-16 DIAGNOSIS — K449 Diaphragmatic hernia without obstruction or gangrene: Secondary | ICD-10-CM | POA: Insufficient documentation

## 2016-04-16 LAB — CBC WITH DIFFERENTIAL/PLATELET
BASOS PCT: 0 %
Basophils Absolute: 0 10*3/uL (ref 0.0–0.1)
Eosinophils Absolute: 0.4 10*3/uL (ref 0.0–0.7)
Eosinophils Relative: 8 %
HEMATOCRIT: 33.9 % — AB (ref 36.0–46.0)
HEMOGLOBIN: 10.5 g/dL — AB (ref 12.0–15.0)
LYMPHS PCT: 34 %
Lymphs Abs: 1.5 10*3/uL (ref 0.7–4.0)
MCH: 30.1 pg (ref 26.0–34.0)
MCHC: 31 g/dL (ref 30.0–36.0)
MCV: 97.1 fL (ref 78.0–100.0)
Monocytes Absolute: 0.6 10*3/uL (ref 0.1–1.0)
Monocytes Relative: 12 %
NEUTROS ABS: 2.1 10*3/uL (ref 1.7–7.7)
NEUTROS PCT: 46 %
Platelets: 223 10*3/uL (ref 150–400)
RBC: 3.49 MIL/uL — AB (ref 3.87–5.11)
RDW: 15.2 % (ref 11.5–15.5)
WBC: 4.5 10*3/uL (ref 4.0–10.5)

## 2016-04-16 LAB — BASIC METABOLIC PANEL
ANION GAP: 8 (ref 5–15)
BUN: 16 mg/dL (ref 6–20)
CO2: 28 mmol/L (ref 22–32)
Calcium: 8.1 mg/dL — ABNORMAL LOW (ref 8.9–10.3)
Chloride: 104 mmol/L (ref 101–111)
Creatinine, Ser: 0.67 mg/dL (ref 0.44–1.00)
GFR calc non Af Amer: >60 mL/min (ref >60)
GLUCOSE: 84 mg/dL (ref 65–99)
POTASSIUM: 3.6 mmol/L (ref 3.5–5.1)
Sodium: 140 mmol/L (ref 135–145)

## 2016-04-16 LAB — BRAIN NATRIURETIC PEPTIDE: B Natriuretic Peptide: 50 pg/mL (ref 0.0–100.0)

## 2016-04-16 NOTE — Telephone Encounter (Signed)
Opened In Error

## 2016-04-20 DIAGNOSIS — Z96653 Presence of artificial knee joint, bilateral: Secondary | ICD-10-CM | POA: Diagnosis not present

## 2016-04-20 DIAGNOSIS — J449 Chronic obstructive pulmonary disease, unspecified: Secondary | ICD-10-CM | POA: Diagnosis not present

## 2016-04-20 DIAGNOSIS — Z853 Personal history of malignant neoplasm of breast: Secondary | ICD-10-CM | POA: Diagnosis not present

## 2016-04-20 DIAGNOSIS — I10 Essential (primary) hypertension: Secondary | ICD-10-CM | POA: Diagnosis not present

## 2016-04-20 DIAGNOSIS — Z7982 Long term (current) use of aspirin: Secondary | ICD-10-CM | POA: Diagnosis not present

## 2016-04-20 DIAGNOSIS — K219 Gastro-esophageal reflux disease without esophagitis: Secondary | ICD-10-CM | POA: Diagnosis not present

## 2016-04-20 DIAGNOSIS — Z9013 Acquired absence of bilateral breasts and nipples: Secondary | ICD-10-CM | POA: Diagnosis not present

## 2016-04-20 DIAGNOSIS — Z8701 Personal history of pneumonia (recurrent): Secondary | ICD-10-CM | POA: Diagnosis not present

## 2016-04-20 DIAGNOSIS — I2510 Atherosclerotic heart disease of native coronary artery without angina pectoris: Secondary | ICD-10-CM | POA: Diagnosis not present

## 2016-04-20 DIAGNOSIS — Z8673 Personal history of transient ischemic attack (TIA), and cerebral infarction without residual deficits: Secondary | ICD-10-CM | POA: Diagnosis not present

## 2016-04-20 DIAGNOSIS — F0390 Unspecified dementia without behavioral disturbance: Secondary | ICD-10-CM | POA: Diagnosis not present

## 2016-04-20 DIAGNOSIS — Z87440 Personal history of urinary (tract) infections: Secondary | ICD-10-CM | POA: Diagnosis not present

## 2016-04-20 DIAGNOSIS — Z791 Long term (current) use of non-steroidal anti-inflammatories (NSAID): Secondary | ICD-10-CM | POA: Diagnosis not present

## 2016-04-20 DIAGNOSIS — M810 Age-related osteoporosis without current pathological fracture: Secondary | ICD-10-CM | POA: Diagnosis not present

## 2016-04-20 DIAGNOSIS — Z87891 Personal history of nicotine dependence: Secondary | ICD-10-CM | POA: Diagnosis not present

## 2016-04-20 DIAGNOSIS — G609 Hereditary and idiopathic neuropathy, unspecified: Secondary | ICD-10-CM | POA: Diagnosis not present

## 2016-04-23 ENCOUNTER — Ambulatory Visit: Payer: Medicare Other | Admitting: Family Medicine

## 2016-04-27 DIAGNOSIS — J449 Chronic obstructive pulmonary disease, unspecified: Secondary | ICD-10-CM | POA: Diagnosis not present

## 2016-04-27 DIAGNOSIS — Z87440 Personal history of urinary (tract) infections: Secondary | ICD-10-CM | POA: Diagnosis not present

## 2016-04-27 DIAGNOSIS — F0390 Unspecified dementia without behavioral disturbance: Secondary | ICD-10-CM | POA: Diagnosis not present

## 2016-04-27 DIAGNOSIS — I10 Essential (primary) hypertension: Secondary | ICD-10-CM | POA: Diagnosis not present

## 2016-04-27 DIAGNOSIS — Z8701 Personal history of pneumonia (recurrent): Secondary | ICD-10-CM | POA: Diagnosis not present

## 2016-04-27 DIAGNOSIS — I2510 Atherosclerotic heart disease of native coronary artery without angina pectoris: Secondary | ICD-10-CM | POA: Diagnosis not present

## 2016-05-04 DIAGNOSIS — Z8701 Personal history of pneumonia (recurrent): Secondary | ICD-10-CM | POA: Diagnosis not present

## 2016-05-04 DIAGNOSIS — I2510 Atherosclerotic heart disease of native coronary artery without angina pectoris: Secondary | ICD-10-CM | POA: Diagnosis not present

## 2016-05-04 DIAGNOSIS — Z87440 Personal history of urinary (tract) infections: Secondary | ICD-10-CM | POA: Diagnosis not present

## 2016-05-04 DIAGNOSIS — J449 Chronic obstructive pulmonary disease, unspecified: Secondary | ICD-10-CM | POA: Diagnosis not present

## 2016-05-19 ENCOUNTER — Other Ambulatory Visit: Payer: Self-pay | Admitting: Family Medicine

## 2016-06-04 ENCOUNTER — Other Ambulatory Visit: Payer: Self-pay | Admitting: Family Medicine

## 2016-07-08 ENCOUNTER — Other Ambulatory Visit: Payer: Self-pay | Admitting: Family Medicine

## 2016-07-09 ENCOUNTER — Other Ambulatory Visit: Payer: Self-pay | Admitting: Family Medicine

## 2016-07-10 ENCOUNTER — Other Ambulatory Visit: Payer: Self-pay | Admitting: Family Medicine

## 2016-07-10 ENCOUNTER — Other Ambulatory Visit: Payer: Self-pay

## 2016-07-10 MED ORDER — GABAPENTIN 300 MG PO CAPS: ORAL_CAPSULE | ORAL | 0 refills | Status: DC

## 2016-07-10 MED ORDER — CELECOXIB 100 MG PO CAPS: ORAL_CAPSULE | ORAL | 0 refills | Status: DC

## 2016-07-16 ENCOUNTER — Encounter: Payer: Self-pay | Admitting: Family Medicine

## 2016-07-16 ENCOUNTER — Ambulatory Visit (INDEPENDENT_AMBULATORY_CARE_PROVIDER_SITE_OTHER): Payer: Medicare Other | Admitting: Family Medicine

## 2016-07-16 VITALS — BP 130/72 | HR 68 | Temp 98.9°F | Resp 14 | Ht 65.0 in | Wt 213.0 lb

## 2016-07-16 DIAGNOSIS — M810 Age-related osteoporosis without current pathological fracture: Secondary | ICD-10-CM

## 2016-07-16 DIAGNOSIS — R3589 Other polyuria: Secondary | ICD-10-CM

## 2016-07-16 DIAGNOSIS — R358 Other polyuria: Secondary | ICD-10-CM | POA: Diagnosis not present

## 2016-07-16 DIAGNOSIS — G3184 Mild cognitive impairment, so stated: Secondary | ICD-10-CM

## 2016-07-16 DIAGNOSIS — Z730 Burn-out: Secondary | ICD-10-CM | POA: Diagnosis not present

## 2016-07-16 DIAGNOSIS — R5383 Other fatigue: Secondary | ICD-10-CM

## 2016-07-16 DIAGNOSIS — Z9181 History of falling: Secondary | ICD-10-CM

## 2016-07-16 DIAGNOSIS — J449 Chronic obstructive pulmonary disease, unspecified: Secondary | ICD-10-CM

## 2016-07-16 DIAGNOSIS — H6123 Impacted cerumen, bilateral: Secondary | ICD-10-CM | POA: Diagnosis not present

## 2016-07-16 LAB — CBC
HEMATOCRIT: 42.5 % (ref 35.0–45.0)
HEMOGLOBIN: 13.1 g/dL (ref 11.7–15.5)
MCH: 29.2 pg (ref 27.0–33.0)
MCHC: 30.8 g/dL — ABNORMAL LOW (ref 32.0–36.0)
MCV: 94.9 fL (ref 80.0–100.0)
MPV: 10.2 fL (ref 7.5–12.5)
Platelets: 182 10*3/uL (ref 140–400)
RBC: 4.48 MIL/uL (ref 3.80–5.10)
RDW: 15.1 % — ABNORMAL HIGH (ref 11.0–15.0)
WBC: 4.2 10*3/uL (ref 3.8–10.8)

## 2016-07-16 LAB — COMPLETE METABOLIC PANEL WITH GFR
ALBUMIN: 3.6 g/dL (ref 3.6–5.1)
ALK PHOS: 89 U/L (ref 33–130)
ALT: 6 U/L (ref 6–29)
AST: 13 U/L (ref 10–35)
BILIRUBIN TOTAL: 0.4 mg/dL (ref 0.2–1.2)
BUN: 16 mg/dL (ref 7–25)
CALCIUM: 8.6 mg/dL (ref 8.6–10.4)
CO2: 29 mmol/L (ref 20–31)
CREATININE: 0.8 mg/dL (ref 0.60–0.88)
Chloride: 106 mmol/L (ref 98–110)
GFR, Est African American: 74 mL/min (ref >=60)
GFR, Est Non African American: 64 mL/min (ref >=60)
GLUCOSE: 81 mg/dL (ref 65–99)
Potassium: 3.9 mmol/L (ref 3.5–5.3)
SODIUM: 144 mmol/L (ref 135–146)
TOTAL PROTEIN: 6.4 g/dL (ref 6.1–8.1)

## 2016-07-16 LAB — TSH: TSH: 1.63 mIU/L

## 2016-07-16 MED ORDER — DONEPEZIL HCL 5 MG PO TABS: ORAL_TABLET | ORAL | 1 refills | Status: DC

## 2016-07-16 MED ORDER — CETIRIZINE HCL 10 MG PO TABS: 10.0000 mg | ORAL_TABLET | Freq: Every day | ORAL | 3 refills | Status: DC

## 2016-07-16 MED ORDER — GABAPENTIN 300 MG PO CAPS: ORAL_CAPSULE | ORAL | 0 refills | Status: DC

## 2016-07-16 MED ORDER — CELECOXIB 100 MG PO CAPS: ORAL_CAPSULE | ORAL | 0 refills | Status: DC

## 2016-07-16 NOTE — Patient Instructions (Addendum)
F/u in 4.5 month, call if ou need me before  PLease schedule appt with Dr Delton SeeNelson for bilateral ear flush, soonest available   Labs today, cBc, cmp , TSH and vit D  I will contact your daughter after I speak with Tammie, [phone]  Urine for culture from home  Thank you  for choosing [facility]. We consider it a privelige to serve you.  Delivering excellent health care in a caring and  compassionate way is our goal.  Partnering with you,  so that together we can achieve this goal is our strategy.

## 2016-07-21 ENCOUNTER — Telehealth: Payer: Self-pay | Admitting: Family Medicine

## 2016-07-21 DIAGNOSIS — Z730 Burn-out: Secondary | ICD-10-CM | POA: Insufficient documentation

## 2016-07-21 DIAGNOSIS — H6123 Impacted cerumen, bilateral: Secondary | ICD-10-CM | POA: Insufficient documentation

## 2016-07-21 NOTE — Assessment & Plan Note (Addendum)
Clinically stable,

## 2016-07-21 NOTE — Assessment & Plan Note (Signed)
Daughter stating clearly the need for respite care short term and asking for help. will obtain baseline lab data, and reach out to SNF recently [facility] to , [facility] to see availability

## 2016-07-21 NOTE — Assessment & Plan Note (Signed)
Chronic and will progressively worsen, in the past daughter has been resistant to increasing the medication dose, not addressed at this visit

## 2016-07-21 NOTE — Assessment & Plan Note (Signed)
Severe osteoarthritis with limitation in mobility. Needs assistance with all ADL's and incapable of living independently. Currently her sole caregiver is experiencing burnout . Will attempt to have respite placement through SNF if possible

## 2016-07-21 NOTE — Assessment & Plan Note (Signed)
Hearing loss with midlcerumen impaction. Daughter requests ear flush. Ill refer for appt with Dr Delton SeeNelson

## 2016-07-21 NOTE — Progress Notes (Signed)
   Jennifer BeetsVelma M [name]     MRN: [id number]      DOB: 08/26/1922   HPI Jennifer [name] is here for follow up and re-evaluation of chronic medical conditions, Pt initially c/o pain everywhere , uncontrolled, at the end of the visit , when pain medication was offered , she stated that she was not hurting. Daughter readily states hat she is overwhelmed and  Needs a break from caring for her Mother States mother is weak, not eating much, not hearing well, convinced that she needs her ears flushed and will bring her back for this ROS Denies recent fever or chills. Denies sinus pressure, nasal congestion, ear pain or sore throat. Denies chest congestion, productive cough or wheezing. Denies chest pains, palpitations and leg swelling Denies abdominal pain, nausea, vomiting,diarrhea or constipation.   Denies dysuria, frequency, hesitancy or incontinence. . Denies headaches, seizures, numbness, or tingling. Denies depression, anxiety or insomnia. Denies skin break down or rash.   PE  Pulse 68   Temp 98.9 F (37.2 C)   Resp 14   Ht 5\' 5"  (1.651 m)   Wt 213 lb (96.6 kg)   SpO2 97%   BMI 35.45 kg/m   Patient alert and in no cardiopulmonary distress.  HEENT: No facial asymmetry, EOMI,   oropharynx pink and moist.  Neck decreased ROM no JVD, no mass.  Chest: Clear to auscultation bilaterally.  CVS: S1, S2 no murmurs, no S3.Regular rate.  ABD: Soft non tender.   Ext: No edema  WU:[id number]:Decreased ROM spine, shoulders, hips and knees.  Skin: Intact, no ulcerations or rash noted.  Psych: Good eye contact, normal affect. Memory impaired  not anxious or depressed appearing.  CNS: CN 2-12 intact, power,  normal throughout.no focal deficits noted.   Assessment & Plan  MCI (mild cognitive impairment) Chronic and will progressively worsen, in the past daughter has been resistant to increasing the medication dose, not addressed at this visit  COPD (chronic obstructive pulmonary disease)  (HCC) Clinically stable,   At high risk for falls Severe osteoarthritis with limitation in mobility. Needs assistance with all ADL's and incapable of living independently. Currently her sole caregiver is experiencing burnout . Will attempt to have respite placement through SNF if possible  Burnout of caregiver Daughter stating clearly the need for respite care short term and asking for help. will obtain baseline lab data, and reach out to [facility] to see availability  Impacted cerumen, bilateral Hearing loss with midlcerumen impaction. Daughter requests ear flush. Ill refer for appt with Dr Delton SeeNelson

## 2016-07-21 NOTE — Telephone Encounter (Signed)
I have left a message with Iverson Alaminammie Morris  At [phone] asking her to look at [facility] placement of [facility]. Daughter is burnt out and requested this at visit last week. She may call the office about it (the daughter)  If she does not , I will follow up on Friday. I asked Tammie to reach out to the daughter after she reviewed the chart, so just in case you get a call from either of them this is the situation  She had also wanted her ears flushed and I am sending a message to Eunice BlaseDebbie to call her daughter after she schedules the appt with Dr Delton SeeNelson for this

## 2016-07-23 LAB — VITAMIN D 1,25 DIHYDROXY
Vitamin D 1, 25 (OH)2 Total: 44 pg/mL (ref 18–72)
Vitamin D2 1, 25 (OH)2: <8 pg/mL
Vitamin D3 1, 25 (OH)2: 44 pg/mL

## 2016-07-23 NOTE — Telephone Encounter (Signed)
Noted and pt in for ear flush

## 2016-08-25 ENCOUNTER — Other Ambulatory Visit: Payer: Self-pay | Admitting: Family Medicine

## 2016-09-19 ENCOUNTER — Other Ambulatory Visit: Payer: Self-pay | Admitting: Family Medicine

## 2016-09-21 NOTE — Telephone Encounter (Signed)
Seen 7 5 [phone] 

## 2016-10-09 ENCOUNTER — Ambulatory Visit (INDEPENDENT_AMBULATORY_CARE_PROVIDER_SITE_OTHER): Payer: Medicare Other

## 2016-10-09 DIAGNOSIS — Z23 Encounter for immunization: Secondary | ICD-10-CM

## 2016-10-10 ENCOUNTER — Other Ambulatory Visit: Payer: Self-pay | Admitting: Family Medicine

## 2016-12-07 ENCOUNTER — Other Ambulatory Visit: Payer: Self-pay | Admitting: Family Medicine

## 2016-12-08 ENCOUNTER — Ambulatory Visit (INDEPENDENT_AMBULATORY_CARE_PROVIDER_SITE_OTHER): Payer: Medicare Other | Admitting: Family Medicine

## 2016-12-08 ENCOUNTER — Encounter: Payer: Self-pay | Admitting: Family Medicine

## 2016-12-08 VITALS — BP 122/74 | HR 70 | Resp 16 | Ht 65.0 in | Wt 194.0 lb

## 2016-12-08 DIAGNOSIS — R32 Unspecified urinary incontinence: Secondary | ICD-10-CM

## 2016-12-08 DIAGNOSIS — G3184 Mild cognitive impairment, so stated: Secondary | ICD-10-CM

## 2016-12-08 DIAGNOSIS — M158 Other polyosteoarthritis: Secondary | ICD-10-CM

## 2016-12-08 DIAGNOSIS — J449 Chronic obstructive pulmonary disease, unspecified: Secondary | ICD-10-CM

## 2016-12-08 DIAGNOSIS — Z9181 History of falling: Secondary | ICD-10-CM | POA: Diagnosis not present

## 2016-12-08 DIAGNOSIS — Z730 Burn-out: Secondary | ICD-10-CM | POA: Diagnosis not present

## 2016-12-08 MED ORDER — GABAPENTIN 300 MG PO CAPS: ORAL_CAPSULE | ORAL | 1 refills | Status: DC

## 2016-12-08 MED ORDER — OXYBUTYNIN CHLORIDE ER 5 MG PO TB24: 5.0000 mg | ORAL_TABLET | Freq: Every day | ORAL | 6 refills | Status: DC

## 2016-12-08 MED ORDER — CELECOXIB 100 MG PO CAPS: 100.0000 mg | ORAL_CAPSULE | Freq: Every day | ORAL | 1 refills | Status: DC

## 2016-12-08 MED ORDER — LORATADINE 10 MG PO TABS: 10.0000 mg | ORAL_TABLET | Freq: Every day | ORAL | 11 refills | Status: DC

## 2016-12-08 MED ORDER — DONEPEZIL HCL 5 MG PO TABS: ORAL_TABLET | ORAL | 1 refills | Status: DC

## 2016-12-08 NOTE — Patient Instructions (Addendum)
F/u I  July, with MMSE, call if you need me before  Please be careful you do not fall  Fall Prevention in the Home Falls can cause injuries and can affect people from all age groups. There are many simple things that you can do to make your home safe and to help prevent falls. What can I do on the outside of my home?  Regularly repair the edges of walkways and driveways and fix any cracks.  Remove high doorway thresholds.  Trim any shrubbery on the main path into your home.  Use bright outdoor lighting.  Clear walkways of debris and clutter, including tools and rocks.  Regularly check that handrails are securely fastened and in good repair. Both sides of any steps should have handrails.  Install guardrails along the edges of any raised decks or porches.  Have leaves, snow, and ice cleared regularly.  Use sand or salt on walkways during winter months.  In the garage, clean up any spills right away, including grease or oil spills. What can I do in the bathroom?  Use night lights.  Install grab bars by the toilet and in the tub and shower. Do not use towel bars as grab bars.  Use non-skid mats or decals on the floor of the tub or shower.  If you need to sit down while you are in the shower, use a plastic, non-slip stool.  Keep the floor dry. Immediately clean up any water that spills on the floor.  Remove soap buildup in the tub or shower on a regular basis.  Attach bath mats securely with double-sided non-slip rug tape.  Remove throw rugs and other tripping hazards from the floor. What can I do in the bedroom?  Use night lights.  Make sure that a bedside light is easy to reach.  Do not use oversized bedding that drapes onto the floor.  Have a firm chair that has side arms to use for getting dressed.  Remove throw rugs and other tripping hazards from the floor. What can I do in the kitchen?  Clean up any spills right away.  Avoid walking on wet floors.  Place  frequently used items in easy-to-reach places.  If you need to reach for something above you, use a sturdy step stool that has a grab bar.  Keep electrical cables out of the way.  Do not use floor polish or wax that makes floors slippery. If you have to use wax, make sure that it is non-skid floor wax.  Remove throw rugs and other tripping hazards from the floor. What can I do in the stairways?  Do not leave any items on the stairs.  Make sure that there are handrails on both sides of the stairs. Fix handrails that are broken or loose. Make sure that handrails are as long as the stairways.  Check any carpeting to make sure that it is firmly attached to the stairs. Fix any carpet that is loose or worn.  Avoid having throw rugs at the top or bottom of stairways, or secure the rugs with carpet tape to prevent them from moving.  Make sure that you have a light switch at the top of the stairs and the bottom of the stairs. If you do not have them, have them installed. What are some other fall prevention tips?  Wear closed-toe shoes that fit well and support your feet. Wear shoes that have rubber soles or low heels.  When you use a stepladder, make sure  that it is completely opened and that the sides are firmly locked. Have someone hold the ladder while you are using it. Do not climb a closed stepladder.  Add color or contrast paint or tape to grab bars and handrails in your home. Place contrasting color strips on the first and last steps.  Use mobility aids as needed, such as canes, walkers, scooters, and crutches.  Turn on lights if it is dark. Replace any light bulbs that burn out.  Set up furniture so that there are clear paths. Keep the furniture in the same spot.  Fix any uneven floor surfaces.  Choose a carpet design that does not hide the edge of steps of a stairway.  Be aware of any and all pets.  Review your medicines with your healthcare provider. Some medicines can cause  dizziness or changes in blood pressure, which increase your risk of falling. Talk with your health care provider about other ways that you can decrease your risk of falls. This may include working with a physical therapist or trainer to improve your strength, balance, and endurance. This information is not intended to replace advice given to you by your health care provider. Make sure you discuss any questions you have with your health care provider. Document Released: 12/19/2001 Document Revised: 05/28/2015 Document Reviewed: 02/02/2014 Elsevier Interactive Patient Education  2017 [employer]

## 2016-12-10 ENCOUNTER — Ambulatory Visit: Payer: Medicare Other

## 2016-12-15 ENCOUNTER — Encounter: Payer: Self-pay | Admitting: Family Medicine

## 2016-12-20 ENCOUNTER — Encounter: Payer: Self-pay | Admitting: Family Medicine

## 2016-12-20 NOTE — Assessment & Plan Note (Signed)
Updated MMSE testing at next visit Has been on inappropriate dose of aricept per request of her daughter who now expresses concern about increasing the dose of the medication possibly as she feels as though patient is exhibiting signs of deterioration Will formally repeat test at next visit

## 2016-12-20 NOTE — Progress Notes (Signed)
   Jennifer BeetsVelma M [name]     MRN: [id number]      DOB: October 19, 1922   HPI [name] is here for follow up and re-evaluation of chronic medical conditions, medication management and review of any available recent lab and radiology data.  Preventive health is updated, specifically  Cancer screening and Immunization.    The PT and her daughter deny any adverse reactions to current medications since the last visit.  There are no new concerns.  There are no specific complaints   ROS Denies recent fever or chills. Denies sinus pressure, nasal congestion, ear pain or sore throat. Denies chest congestion, productive cough or wheezing. Denies chest pains, palpitations and leg swelling Denies abdominal pain, nausea, vomiting,diarrhea or constipation.   Denies dysuria, frequency, hesitancy or incontinence. C/o chronic  joint pain, swelling and limitation in mobility. Denies headaches, seizures, numbness, or tingling. Denies depression, anxiety or insomnia. Denies skin break down or rash.   PE  BP 122/74   Pulse 70   Resp 16   Ht 5\' 5"  (1.651 m)   Wt 194 lb (88 kg)   SpO2 94%   BMI 32.28 kg/m   Patient alert and in no cardiopulmonary distress.  HEENT: No facial asymmetry, EOMI,   oropharynx pink and moist.  Neck supple no JVD, no mass.  Chest: Clear to auscultation bilaterally.  CVS: S1, S2 no murmurs, no S3.Regular rate.  ABD: Soft non tender. Rectal exam refused by daughter, which is in keeping with general recommendations Ext: No edema  WU:[id number]:Decreased  ROM spine, shoulders, hips and knees.  Skin: Intact, no ulcerations or rash noted.  Psych: Good eye contact, normal affect. Memory impaired  not anxious or depressed appearing.  CNS: CN 2-12 intact, power,  normal throughout.no focal deficits noted.   Assessment & Plan  COPD (chronic obstructive pulmonary disease) (HCC) Stable , has discontinued smoking, continue current medication  MCI (mild cognitive impairment) Updated  MMSE testing at next visit Has been on inappropriate dose of aricept per request of her daughter who now expresses concern about increasing the dose of the medication possibly as she feels as though patient is exhibiting signs of deterioration Will formally repeat test at next visit  Osteoarthritis Generalized, placing patient at increased fall risk. No h/o falls other than sliding out of chair. Pain control adequate on current regime, no disturbance in sleep noted  Urinary incontinence Unchanged, mainly because of limitation in mobility, need to continue medication to assist with reducing wetting incidents, she continues to need incontinence supple also  Burnout of caregiver Benefited greatly from a 2 week break when her son cared for her Mother, states Respite care was too expensive but is definitely aware of the need for physical and omental and emotional breaks from caregiving  At high risk for falls Home safety reviewed and discussed with both [name] and her daughter, she uses an assistive device at all times

## 2016-12-20 NOTE — Assessment & Plan Note (Signed)
Home safety reviewed and discussed with both [name] and her daughter, she uses an assistive device at all times

## 2016-12-20 NOTE — Assessment & Plan Note (Signed)
Unchanged, mainly because of limitation in mobility, need to continue medication to assist with reducing wetting incidents, she continues to need incontinence supple also

## 2016-12-20 NOTE — Assessment & Plan Note (Signed)
Benefited greatly from a 2 week break when her son cared for her Mother, states Respite care was too expensive but is definitely aware of the need for physical and omental and emotional breaks from caregiving

## 2016-12-20 NOTE — Assessment & Plan Note (Signed)
Generalized, placing patient at increased fall risk. No h/o falls other than sliding out of chair. Pain control adequate on current regime, no disturbance in sleep noted

## 2016-12-20 NOTE — Assessment & Plan Note (Signed)
Stable , has discontinued smoking, continue current medication

## 2017-01-04 ENCOUNTER — Other Ambulatory Visit: Payer: Self-pay | Admitting: Family Medicine

## 2017-01-06 NOTE — Telephone Encounter (Signed)
Seen [phone] 

## 2017-02-06 ENCOUNTER — Other Ambulatory Visit: Payer: Self-pay | Admitting: Family Medicine

## 2017-02-11 DIAGNOSIS — H04123 Dry eye syndrome of bilateral lacrimal glands: Secondary | ICD-10-CM | POA: Diagnosis not present

## 2017-02-11 DIAGNOSIS — H02132 Senile ectropion of right lower eyelid: Secondary | ICD-10-CM | POA: Diagnosis not present

## 2017-02-11 DIAGNOSIS — H2511 Age-related nuclear cataract, right eye: Secondary | ICD-10-CM | POA: Diagnosis not present

## 2017-02-11 DIAGNOSIS — H02135 Senile ectropion of left lower eyelid: Secondary | ICD-10-CM | POA: Diagnosis not present

## 2017-03-04 ENCOUNTER — Ambulatory Visit (INDEPENDENT_AMBULATORY_CARE_PROVIDER_SITE_OTHER): Payer: Medicare Other | Admitting: Otolaryngology

## 2017-03-04 DIAGNOSIS — H903 Sensorineural hearing loss, bilateral: Secondary | ICD-10-CM

## 2017-03-04 DIAGNOSIS — H6123 Impacted cerumen, bilateral: Secondary | ICD-10-CM | POA: Diagnosis not present

## 2017-03-22 ENCOUNTER — Encounter: Payer: Self-pay | Admitting: Family Medicine

## 2017-04-14 ENCOUNTER — Other Ambulatory Visit: Payer: Self-pay | Admitting: Family Medicine

## 2017-04-28 ENCOUNTER — Other Ambulatory Visit: Payer: Self-pay

## 2017-04-28 ENCOUNTER — Encounter (HOSPITAL_COMMUNITY): Payer: Self-pay | Admitting: Emergency Medicine

## 2017-04-28 ENCOUNTER — Emergency Department (HOSPITAL_COMMUNITY): Payer: Medicare Other

## 2017-04-28 ENCOUNTER — Emergency Department (HOSPITAL_COMMUNITY)
Admission: EM | Admit: 2017-04-28 | Discharge: 2017-04-28 | Disposition: A | Discharge status: Home / Self Care | Payer: Medicare Other | Attending: Emergency Medicine | Admitting: Emergency Medicine

## 2017-04-28 DIAGNOSIS — M25571 Pain in right ankle and joints of right foot: Secondary | ICD-10-CM | POA: Diagnosis not present

## 2017-04-28 DIAGNOSIS — S93401A Sprain of unspecified ligament of right ankle, initial encounter: Secondary | ICD-10-CM | POA: Diagnosis not present

## 2017-04-28 DIAGNOSIS — Z87891 Personal history of nicotine dependence: Secondary | ICD-10-CM | POA: Insufficient documentation

## 2017-04-28 DIAGNOSIS — X500XXA Overexertion from strenuous movement or load, initial encounter: Secondary | ICD-10-CM | POA: Insufficient documentation

## 2017-04-28 DIAGNOSIS — Z79899 Other long term (current) drug therapy: Secondary | ICD-10-CM | POA: Diagnosis not present

## 2017-04-28 DIAGNOSIS — Z96651 Presence of right artificial knee joint: Secondary | ICD-10-CM | POA: Insufficient documentation

## 2017-04-28 DIAGNOSIS — I2510 Atherosclerotic heart disease of native coronary artery without angina pectoris: Secondary | ICD-10-CM | POA: Diagnosis not present

## 2017-04-28 DIAGNOSIS — S99911A Unspecified injury of right ankle, initial encounter: Secondary | ICD-10-CM | POA: Diagnosis present

## 2017-04-28 DIAGNOSIS — I119 Hypertensive heart disease without heart failure: Secondary | ICD-10-CM | POA: Insufficient documentation

## 2017-04-28 DIAGNOSIS — Y929 Unspecified place or not applicable: Secondary | ICD-10-CM | POA: Insufficient documentation

## 2017-04-28 DIAGNOSIS — Y999 Unspecified external cause status: Secondary | ICD-10-CM | POA: Diagnosis not present

## 2017-04-28 DIAGNOSIS — J449 Chronic obstructive pulmonary disease, unspecified: Secondary | ICD-10-CM | POA: Diagnosis not present

## 2017-04-28 DIAGNOSIS — Y9389 Activity, other specified: Secondary | ICD-10-CM | POA: Diagnosis not present

## 2017-04-28 NOTE — Discharge Instructions (Addendum)
Your xrays are negative today for fractured bones or dislocation.  You have sprained your ankle which should heal without difficulty.  Resting the ankle,  elevation and using an ice pack can help with swelling.  I recommend using an ace wrap which can also help improve the swelling.  If you are having pain you may use tylenol.

## 2017-04-28 NOTE — ED Provider Notes (Signed)
[facility]GENCY DEPARTMENT Provider Note   CSN: [id number] Arrival date & time: 04/28/17  1616     History   Chief Complaint Chief Complaint  Patient presents with  . Ankle Pain    HPI Jennifer [name] is a 84 y.o. female presenting with swelling in her right ankle since she twisted the ankle getting out of a car 2 days ago.  She denies pain in the ankle or foot and daughter at bedside states she has been ambulating (uses a walker at baseline) without difficulty.  She denies any symptoms.  Her son was concerned about the swelling and was desirous of an xray.  She has had no treatment prior to arrival.  The history is provided by the patient and a relative.    Past Medical History:  Diagnosis Date  . Anemia    iron deficiency post op.  Marland Kitchen. CAD (coronary artery disease) 2008   Cath 40% mid LAD, 35% RCA  . Cellulitis of finger    right   . Chest pain 2006  . Chronic back pain   . COPD (chronic obstructive pulmonary disease) (HCC)   . Gastroesophageal reflux disease   . Hiatal hernia   . HTN (hypertension) 04/22/2014  . Hyperlipidemia   . Nicotine addiction   . Osteoarthritis    status post left TKA; surgery on the right is anticipated in the near future   . Pulmonary nodules    stable since 2006  . Shingles    right breast   . Skin infection   . Ulcer    gential  . Use of cane as ambulatory aid     Patient Active Problem List   Diagnosis Date Noted  . Burnout of caregiver 07/21/2016  . DNR no code (do not resuscitate) 04/25/2014  . Urinary incontinence 04/04/2013  . Osteoporosis, post-menopausal 12/06/2012  . At high risk for falls 07/30/2012  . MCI (mild cognitive impairment) 08/19/2011  . COPD (chronic obstructive pulmonary disease) (HCC) 06/25/2011  . CEREBROVASCULAR DISEASE 08/05/2009  . Spinal stenosis 06/09/2007  . Coronary atherosclerosis 01/25/2007  . HIATAL HERNIA WITH REFLUX 01/25/2007  . Osteoarthritis 01/25/2007    Past Surgical History:    Procedure Laterality Date  . ABDOMINAL HYSTERECTOMY    . BREAST SURGERY N/A    bilateral  . CARDIAC CATHETERIZATION  2008   Mid LAD 40%, RCA 25%.   . CHOLECYSTECTOMY    . COMBINED HYSTERECTOMY ABDOMINAL W/ A&P REPAIR / OOPHORECTOMY  approx. 40 years ago   . EYE SURGERY    . MASTECTOMY  1998 & 2007    right -1998 / left 2007  . right knee replacement  09/2009   Dr. Romeo AppleHarrison  . TOTAL KNEE ARTHROPLASTY  4/08   left      OB History    Gravida      Para      Term      Preterm      AB      Living  1     SAB      TAB      Ectopic      Multiple      Live Births               Home Medications    Prior to Admission medications   Medication Sig Start Date End Date Taking? Authorizing Provider  acetaminophen (TYLENOL) 500 MG tablet Take 1,000 mg by mouth 2 (two) times daily. *May take an additional dose  if needed for pain    [provider]  albuterol (PROVENTIL HFA;VENTOLIN HFA) 108 (90 BASE) MCG/ACT inhaler Inhale 2 puffs into the lungs every 6 (six) hours as needed for wheezing or shortness of breath. 08/27/14   [name], MD  celecoxib (CELEBREX) 100 MG capsule Take 1 capsule (100 mg total) by mouth daily. 12/08/16   [name], MD  cetirizine (ZYRTEC) 10 MG tablet TAKE 1 TABLET(10 MG) BY MOUTH DAILY 01/06/17   [name], MD  donepezil (ARICEPT) 5 MG tablet TAKE 1 TABLET(5 MG) BY MOUTH AT BEDTIME 12/08/16   [name], MD  gabapentin (NEURONTIN) 300 MG capsule TAKE 1 CAPSULE BY MOUTH EVERY MORNING THEN TAKE 2 CAPSULES BY MOUTH EVERY NIGHT AT BEDTIME 02/08/17   [name], MD  gabapentin (NEURONTIN) 300 MG capsule TAKE 1 CAPSULE BY MOUTH EVERY MORNING THEN TAKE 2 CAPSULES BY MOUTH EVERY NIGHT AT BEDTIME 04/15/17   [name], MD  ipratropium-albuterol (DUONEB) 0.5-2.5 (3) MG/3ML SOLN Take 3 mLs by nebulization every 6 (six) hours as needed.     [provider]  loratadine (CLARITIN) 10 MG tablet  Take 1 tablet (10 mg total) by mouth daily. 12/08/16   [name], MD  oxybutynin (DITROPAN-XL) 5 MG 24 hr tablet Take 1 tablet (5 mg total) by mouth at bedtime. 12/08/16   [name], MD  OXYGEN Give 2 L/mn via to keep o2 sat greater than 90%    [provider]    Family History Family History  Problem Relation Age of Onset  . Cancer Mother 6       breast   . Cancer Father 80       prostate  . Hypertension Father   . Hypertension Sister   . Heart disease Sister   . Heart disease Sister   . Heart disease Sister     Social History Social History   Tobacco Use  . Smoking status: Former Smoker    Packs/day: 0.25    Years: 70.00    Pack years: 17.50    Types: Cigarettes    Last attempt to quit: 03/13/2015    Years since quitting: 2.1  . Smokeless tobacco: Never Used  Substance Use Topics  . Alcohol use: No    Alcohol/week: 0.0 oz  . Drug use: No     Allergies   Iohexol; Iron; and Penicillins   Review of Systems Review of Systems  Musculoskeletal: Positive for arthralgias and joint swelling.  Skin: Negative for wound.  Neurological: Negative for weakness and numbness.     Physical Exam Updated Vital Signs BP (!) 144/81 (BP Location: Right Arm)   Pulse 69   Temp 98.4 F (36.9 C) (Temporal)   Resp 14   Wt 88 kg (194 lb)   SpO2 100%   BMI 32.28 kg/m   Physical Exam  Constitutional: She appears well-developed and well-nourished.  HENT:  Head: Normocephalic.  Cardiovascular: Normal rate and intact distal pulses. Exam reveals no decreased pulses.  Pulses:      Dorsalis pedis pulses are 2+ on the right side, and 2+ on the left side.       Posterior tibial pulses are 2+ on the right side, and 2+ on the left side.  Musculoskeletal: She exhibits edema. She exhibits no tenderness.       Right ankle: She exhibits swelling. She exhibits normal range of motion, no ecchymosis, no deformity and normal pulse. No tenderness. No  head of 5th  metatarsal and no proximal fibula tenderness found. Achilles tendon normal.  Neurological: She is alert. No sensory deficit.  Skin: Skin is warm, dry and intact.  Nursing note and vitals reviewed.    ED Treatments / Results  Labs (all labs ordered are listed, but only abnormal results are displayed) Labs Reviewed - No data to display  EKG None  Radiology Dg Ankle Complete Right  Result Date: 04/28/2017 CLINICAL DATA:  Right ankle pain and swelling after fall yesterday. EXAM: RIGHT ANKLE - COMPLETE 3+ VIEW COMPARISON:  None. FINDINGS: There is no evidence of fracture, dislocation, or joint effusion. There is no evidence of arthropathy or other focal bone abnormality. Soft tissues are unremarkable. IMPRESSION: No acute abnormality seen in the right ankle. Electronically Signed   By: Lupita Raider, M.D.   On: 04/28/2017 16:55    Procedures Procedures (including critical care time)  Medications Ordered in ED Medications - No data to display   Initial Impression / Assessment and Plan / ED Course  I have reviewed the triage vital signs and the nursing notes.  Pertinent labs & imaging results that were available during my care of the patient were reviewed by me and considered in my medical decision making (see chart for details).     Imaging reviewed and discussed with pt and dg. activities as tolerated Elevation, ice, ace wrap applied (dg states pt will not tolerate aso or air cast).  Advised prn f/u with pcp prn.  Pt was seen by Dr. Hyacinth Meeker during todays visit.  Final Clinical Impressions(s) / ED Diagnoses   Final diagnoses:  Sprain of right ankle, unspecified ligament, initial encounter    ED Discharge Orders    None       Victoriano Lain 04/28/17 1735    Eber Hong, MD 05/09/17 [phone]

## 2017-04-28 NOTE — ED Triage Notes (Signed)
Pt was getting into a car and "collapsed" injuring her right ankle on Monday. Pt never LOC. Pt has been walking on it without difficulty but family noticed her ankle swelling and wanted to get her evaluated

## 2017-04-28 NOTE — ED Notes (Signed)
Ace wrap applied, Patient given discharge instruction, verbalized understand. Patient in wheelchair out of the department.

## 2017-04-28 NOTE — ED Provider Notes (Signed)
Patient injured her right ankle a couple of days ago when she was trying to get into a car, she has had some difficulty walking, there is been some noted swelling but the patient has been ambulatory and does not have any complaints at this time.  On exam she does have a mildly swollen ankle x-rays show no signs of fracture, patient stable for discharge  Has family support and f/u as needed  Medical screening examination/treatment/procedure(s) were conducted as a shared visit with non-physician practitioner(s) and myself.  I personally evaluated the patient during the encounter.  Clinical Impression:   Final diagnoses:  Sprain of right ankle, unspecified ligament, initial encounter    Dg Ankle Complete Right  Result Date: 04/28/2017 CLINICAL DATA:  Right ankle pain and swelling after fall yesterday. EXAM: RIGHT ANKLE - COMPLETE 3+ VIEW COMPARISON:  None. FINDINGS: There is no evidence of fracture, dislocation, or joint effusion. There is no evidence of arthropathy or other focal bone abnormality. Soft tissues are unremarkable. IMPRESSION: No acute abnormality seen in the right ankle. Electronically Signed   By: Lupita RaiderJames  Green Jr, M.D.   On: 04/28/2017 16:55      Eber HongMiller, Ezzie Senat, MD 05/09/17 [phone]

## 2017-05-06 ENCOUNTER — Ambulatory Visit (INDEPENDENT_AMBULATORY_CARE_PROVIDER_SITE_OTHER): Payer: Medicare Other | Admitting: Family Medicine

## 2017-05-06 ENCOUNTER — Other Ambulatory Visit: Payer: Self-pay

## 2017-05-06 ENCOUNTER — Encounter: Payer: Self-pay | Admitting: Family Medicine

## 2017-05-06 VITALS — BP 128/70 | HR 72 | Ht 65.0 in | Wt 202.0 lb

## 2017-05-06 DIAGNOSIS — M5442 Lumbago with sciatica, left side: Secondary | ICD-10-CM

## 2017-05-06 DIAGNOSIS — R5383 Other fatigue: Secondary | ICD-10-CM | POA: Diagnosis not present

## 2017-05-06 DIAGNOSIS — J302 Other seasonal allergic rhinitis: Secondary | ICD-10-CM

## 2017-05-06 DIAGNOSIS — M5441 Lumbago with sciatica, right side: Secondary | ICD-10-CM

## 2017-05-06 DIAGNOSIS — M158 Other polyosteoarthritis: Secondary | ICD-10-CM | POA: Diagnosis not present

## 2017-05-06 DIAGNOSIS — R32 Unspecified urinary incontinence: Secondary | ICD-10-CM | POA: Diagnosis not present

## 2017-05-06 DIAGNOSIS — G8929 Other chronic pain: Secondary | ICD-10-CM

## 2017-05-06 DIAGNOSIS — Z9181 History of falling: Secondary | ICD-10-CM

## 2017-05-06 DIAGNOSIS — Z730 Burn-out: Secondary | ICD-10-CM | POA: Diagnosis not present

## 2017-05-06 MED ORDER — MONTELUKAST SODIUM 10 MG PO TABS: 10.0000 mg | ORAL_TABLET | Freq: Every day | ORAL | 3 refills | Status: DC

## 2017-05-06 MED ORDER — KETOROLAC TROMETHAMINE 60 MG/2ML IM SOLN
60.0000 mg | Freq: Once | INTRAMUSCULAR | Status: AC
  Administered 2017-05-06: 60 mg via INTRAMUSCULAR

## 2017-05-06 MED ORDER — METHYLPREDNISOLONE ACETATE 80 MG/ML IJ SUSP
80.0000 mg | Freq: Once | INTRAMUSCULAR | Status: AC
  Administered 2017-05-06: 80 mg via INTRAMUSCULAR

## 2017-05-06 MED ORDER — PREDNISONE 5 MG PO TABS: ORAL_TABLET | ORAL | 0 refills | Status: DC

## 2017-05-06 NOTE — Patient Instructions (Addendum)
Wellness with nurse in 4.5 months, call if you need me before  You are getting 2 injections today for pain and allergies  You are starting a new additional medication singulair every day for allergies  I am giving you a prescription for a light wheelchair  Labs today are CBC, chem7

## 2017-05-06 NOTE — Progress Notes (Signed)
   Juanetta BeetsVelma M Wolin     MRN: [id number]      DOB: June 21, 1922   HPI Ms. [name] is here for follow up and re-evaluation of chronic medical conditions, medication management  [name] ia a High risk fall, she has increasing  stiffness in her spine, hips and knees , needs wheelchair for transport both inside and outside of her  Home, and her daughter is requesting  needs respite.requests  rsepite help in June   Last fall was 2 weeks ago, prior to this was 1 month , and then 5 to 6 months prior to that neeeds assistance with baths and food prep, daughter is sole caregiver , last relief was is Sept 2018 for 2 weeks experiencing burnout and is requesting respite care in a skilled nursing facility in the month of June In the interim is requesting hat she ger a lightweight wheelchair to help to transport her mother around when she takes her outside of the home , now she only has a walker  With a seat , which is certainly inadequate for hr needs. C/o un controled allergy symptoms with excess nasal congestion and clear drainage in past 3 to  4 weeks, denies fever or chills ROS Denies recent fever or chills. Denies sinus pressure, , ear pain or sore throat. Denies chest congestion, productive cough or wheezing. Denies chest pains, palpitations and leg swelling Denies abdominal pain, nausea, vomiting,diarrhea or constipation. Appetite is fair  Denies dysuria, frequency, hesitancy does have  incontinence. . Denies headaches, seizures C/o mild  depression, anxiety or insomnia. Denies skin break down or rash. C/o worsening hearing loss and memory loss   PE  BP 128/70 (BP Location: Left Arm, Patient Position: Sitting, Cuff Size: Large)   Pulse 72   Ht 5\' 5"  (1.651 m)   Wt 202 lb (91.6 kg)   SpO2 98%   BMI 33.61 kg/m   Patient alert and in no cardiopulmonary distress.  HEENT: No facial asymmetry, EOMI,   oropharynx pink and moist.  Neck decreased ROM no JVD, no mass.  Chest: Clear to  auscultation bilaterally.  CVS: S1, S2 no murmurs, no S3.Regular rate.  ABD: Soft non tender.   Ext: No edema  MS: Markedly decreased ROM spine, shoulders, hips and knees.  Skin: Intact, no ulcerations or rash noted.  Psych: Good eye contact, flat  affect.  not anxious or depressed appearing.  CNS: CN 2-12 intact, marked hearing impairment, power,  normal throughout.no focal deficits noted.   Assessment & Plan  Osteoarthritis Uncontrolled.Toradol and depo medrol administered IM in the office , to be followed by a short course of oral prednisone and prednisone x 5 days.   Urinary incontinence Unchanged , continue incontinence supplies and oral medication  Allergic rhinitis Depo Medrol 80 mg IM administered followed by short course of prednisone and singulair added  At high risk for falls Progressive arthritis with reduced mobility, needs wheelchair for improved safety both inside and outside of the home  Burnout of caregiver Will contact skilled nursing facility staff re request for Respit care during the month of June

## 2017-05-06 NOTE — Assessment & Plan Note (Signed)
Uncontrolled.Toradol and depo medrol administered IM in the office , to be followed by a short course of oral prednisone and prednisone x 5 days.

## 2017-05-07 LAB — CBC
HCT: 39.3 % (ref 35.0–45.0)
HEMOGLOBIN: 12.6 g/dL (ref 11.7–15.5)
MCH: 30.2 pg (ref 27.0–33.0)
MCHC: 32.1 g/dL (ref 32.0–36.0)
MCV: 94.2 fL (ref 80.0–100.0)
MPV: 10.7 fL (ref 7.5–12.5)
Platelets: 188 10*3/uL (ref 140–400)
RBC: 4.17 10*6/uL (ref 3.80–5.10)
RDW: 12.9 % (ref 11.0–15.0)
WBC: 4.2 10*3/uL (ref 3.8–10.8)

## 2017-05-07 LAB — COMPLETE METABOLIC PANEL WITH GFR
AG Ratio: 1.6 (calc) (ref 1.0–2.5)
ALBUMIN MSPROF: 3.8 g/dL (ref 3.6–5.1)
ALT: 32 U/L — ABNORMAL HIGH (ref 6–29)
AST: 18 U/L (ref 10–35)
Alkaline phosphatase (APISO): 115 U/L (ref 33–130)
BUN / CREAT RATIO: 25 (calc) — AB (ref 6–22)
BUN: 23 mg/dL (ref 7–25)
CALCIUM: 8.7 mg/dL (ref 8.6–10.4)
CO2: 30 mmol/L (ref 20–32)
CREATININE: 0.93 mg/dL — AB (ref 0.60–0.88)
Chloride: 109 mmol/L (ref 98–110)
GFR, EST AFRICAN AMERICAN: 61 mL/min/{1.73_m2} (ref >=60)
GFR, EST NON AFRICAN AMERICAN: 53 mL/min/{1.73_m2} — AB (ref >=60)
GLOBULIN: 2.4 g/dL (ref 1.9–3.7)
Glucose, Bld: 77 mg/dL (ref 65–139)
Potassium: 4.1 mmol/L (ref 3.5–5.3)
SODIUM: 145 mmol/L (ref 135–146)
TOTAL PROTEIN: 6.2 g/dL (ref 6.1–8.1)
Total Bilirubin: 0.4 mg/dL (ref 0.2–1.2)

## 2017-05-10 ENCOUNTER — Encounter: Payer: Self-pay | Admitting: Family Medicine

## 2017-05-10 MED ORDER — GABAPENTIN 300 MG PO CAPS: ORAL_CAPSULE | ORAL | 3 refills | Status: DC

## 2017-05-10 NOTE — Assessment & Plan Note (Signed)
Unchanged , continue incontinence supplies and oral medication

## 2017-05-10 NOTE — Assessment & Plan Note (Signed)
Progressive arthritis with reduced mobility, needs wheelchair for improved safety both inside and outside of the home

## 2017-05-10 NOTE — Assessment & Plan Note (Signed)
Depo Medrol 80 mg IM administered followed by short course of prednisone and singulair added

## 2017-05-10 NOTE — Assessment & Plan Note (Signed)
Will contact skilled nursing facility staff re request for Respit care during the month of June

## 2017-06-17 ENCOUNTER — Other Ambulatory Visit: Payer: Self-pay | Admitting: Family Medicine

## 2017-06-22 ENCOUNTER — Encounter (HOSPITAL_COMMUNITY): Payer: Self-pay | Admitting: Emergency Medicine

## 2017-06-22 ENCOUNTER — Other Ambulatory Visit: Payer: Self-pay

## 2017-06-22 ENCOUNTER — Emergency Department (HOSPITAL_COMMUNITY)
Admission: EM | Admit: 2017-06-22 | Discharge: 2017-06-22 | Disposition: A | Discharge status: Home / Self Care | Payer: Medicare Other | Attending: Emergency Medicine | Admitting: Emergency Medicine

## 2017-06-22 DIAGNOSIS — Z79899 Other long term (current) drug therapy: Secondary | ICD-10-CM | POA: Insufficient documentation

## 2017-06-22 DIAGNOSIS — Z96653 Presence of artificial knee joint, bilateral: Secondary | ICD-10-CM | POA: Diagnosis not present

## 2017-06-22 DIAGNOSIS — I2510 Atherosclerotic heart disease of native coronary artery without angina pectoris: Secondary | ICD-10-CM | POA: Insufficient documentation

## 2017-06-22 DIAGNOSIS — R410 Disorientation, unspecified: Secondary | ICD-10-CM | POA: Diagnosis not present

## 2017-06-22 DIAGNOSIS — R451 Restlessness and agitation: Secondary | ICD-10-CM | POA: Diagnosis not present

## 2017-06-22 DIAGNOSIS — R350 Frequency of micturition: Secondary | ICD-10-CM | POA: Diagnosis present

## 2017-06-22 DIAGNOSIS — J449 Chronic obstructive pulmonary disease, unspecified: Secondary | ICD-10-CM | POA: Insufficient documentation

## 2017-06-22 DIAGNOSIS — Z87891 Personal history of nicotine dependence: Secondary | ICD-10-CM | POA: Diagnosis not present

## 2017-06-22 DIAGNOSIS — N390 Urinary tract infection, site not specified: Secondary | ICD-10-CM | POA: Diagnosis not present

## 2017-06-22 DIAGNOSIS — I10 Essential (primary) hypertension: Secondary | ICD-10-CM | POA: Diagnosis not present

## 2017-06-22 LAB — COMPREHENSIVE METABOLIC PANEL
ALBUMIN: 3.3 g/dL — AB (ref 3.5–5.0)
ALK PHOS: 76 U/L (ref 38–126)
ALT: 16 U/L (ref 14–54)
ANION GAP: 6 (ref 5–15)
AST: 19 U/L (ref 15–41)
BUN: 21 mg/dL — AB (ref 6–20)
CALCIUM: 8.4 mg/dL — AB (ref 8.9–10.3)
CO2: 29 mmol/L (ref 22–32)
Chloride: 107 mmol/L (ref 101–111)
Creatinine, Ser: 0.98 mg/dL (ref 0.44–1.00)
GFR calc Af Amer: 56 mL/min — ABNORMAL LOW (ref >60)
GFR calc non Af Amer: 48 mL/min — ABNORMAL LOW (ref >60)
GLUCOSE: 84 mg/dL (ref 65–99)
Potassium: 4.2 mmol/L (ref 3.5–5.1)
SODIUM: 142 mmol/L (ref 135–145)
Total Bilirubin: 0.7 mg/dL (ref 0.3–1.2)
Total Protein: 6.5 g/dL (ref 6.5–8.1)

## 2017-06-22 LAB — URINALYSIS, ROUTINE W REFLEX MICROSCOPIC
Bilirubin Urine: NEGATIVE
GLUCOSE, UA: NEGATIVE mg/dL
KETONES UR: NEGATIVE mg/dL
NITRITE: POSITIVE — AB
PH: 5 (ref 5.0–8.0)
Protein, ur: 30 mg/dL — AB
SPECIFIC GRAVITY, URINE: 1.028 (ref 1.005–1.030)
WBC, UA: >50 WBC/hpf — ABNORMAL HIGH (ref 0–5)

## 2017-06-22 LAB — CBC WITH DIFFERENTIAL/PLATELET
BASOS ABS: 0 10*3/uL (ref 0.0–0.1)
BASOS PCT: 0 %
EOS ABS: 0.1 10*3/uL (ref 0.0–0.7)
Eosinophils Relative: 4 %
HCT: 39.5 % (ref 36.0–46.0)
HEMOGLOBIN: 12 g/dL (ref 12.0–15.0)
Lymphocytes Relative: 38 %
Lymphs Abs: 1.5 10*3/uL (ref 0.7–4.0)
MCH: 30.2 pg (ref 26.0–34.0)
MCHC: 30.4 g/dL (ref 30.0–36.0)
MCV: 99.5 fL (ref 78.0–100.0)
Monocytes Absolute: 0.4 10*3/uL (ref 0.1–1.0)
Monocytes Relative: 9 %
NEUTROS ABS: 1.9 10*3/uL (ref 1.7–7.7)
NEUTROS PCT: 49 %
Platelets: 176 10*3/uL (ref 150–400)
RBC: 3.97 MIL/uL (ref 3.87–5.11)
RDW: 14.2 % (ref 11.5–15.5)
WBC: 3.9 10*3/uL — AB (ref 4.0–10.5)

## 2017-06-22 MED ORDER — SULFAMETHOXAZOLE-TRIMETHOPRIM 800-160 MG PO TABS: 1.0000 | ORAL_TABLET | Freq: Two times a day (BID) | ORAL | 0 refills | Status: AC

## 2017-06-22 MED ORDER — CEFTRIAXONE SODIUM 1 G IJ SOLR
1.0000 g | Freq: Once | INTRAMUSCULAR | Status: AC
  Administered 2017-06-22: 1 g via INTRAMUSCULAR
  Filled 2017-06-22: qty 10

## 2017-06-22 MED ORDER — RISPERIDONE 0.25 MG PO TABS: 0.2500 mg | ORAL_TABLET | Freq: Two times a day (BID) | ORAL | 0 refills | Status: DC | PRN

## 2017-06-22 NOTE — ED Notes (Signed)
Dr Fayrene FearingJames informed of pts HR SB 50's.

## 2017-06-22 NOTE — ED Provider Notes (Signed)
[facility] EMERGENCY DEPARTMENT Provider Note   CSN: [id number] Arrival date & time: 06/22/17  4098     History   Chief Complaint Chief Complaint  Patient presents with  . Urinary Tract Infection    HPI Jennifer [name] is a 84 y.o. female.  Chief complaint is behavior change,?  Urinary tract infection per daughter  HPI: 84 year old female.  History of mild dementia.  Lives at home.  With daughter, who provides her care.  History is obtained via the daughter who accompanies her here.  Daughter states that for the last 2-3 nights she has been sleeping poorly at night.  She takes gabapentin before bed which normally puts her to sleep.  However she been waking up at night.  Frequently urinating.  At times is incontinent but has been getting up to the restroom more frequently.  No fall or injury.  The other complaints is a daughter states that her "osteoarthritis" in her knees and back has progressed and become more painful.  Daughter is concerned that her "dementia is getting worse".  She states "some days she hears fine, other days she is very hard of hearing".  Fever.  No change in medications.  No change in environment.  Past Medical History:  Diagnosis Date  . Anemia    iron deficiency post op.  Marland Kitchen CAD (coronary artery disease) 2008   Cath 40% mid LAD, 35% RCA  . Cellulitis of finger    right   . Chest pain 2006  . Chronic back pain   . COPD (chronic obstructive pulmonary disease) (HCC)   . Gastroesophageal reflux disease   . Hiatal hernia   . HTN (hypertension) 04/22/2014  . Hyperlipidemia   . Nicotine addiction   . Osteoarthritis    status post left TKA; surgery on the right is anticipated in the near future   . Pulmonary nodules    stable since 2006  . Shingles    right breast   . Skin infection   . Ulcer    gential  . Use of cane as ambulatory aid     Patient Active Problem List   Diagnosis Date Noted  . Burnout of caregiver 07/21/2016  . DNR no code (do not  resuscitate) 04/25/2014  . Urinary incontinence 04/04/2013  . Osteoporosis, post-menopausal 12/06/2012  . At high risk for falls 07/30/2012  . MCI (mild cognitive impairment) 08/19/2011  . COPD (chronic obstructive pulmonary disease) (HCC) 06/25/2011  . Allergic rhinitis 12/23/2010  . CEREBROVASCULAR DISEASE 08/05/2009  . Spinal stenosis 06/09/2007  . Coronary atherosclerosis 01/25/2007  . HIATAL HERNIA WITH REFLUX 01/25/2007  . Osteoarthritis 01/25/2007    Past Surgical History:  Procedure Laterality Date  . ABDOMINAL HYSTERECTOMY    . BREAST SURGERY N/A    bilateral  . CARDIAC CATHETERIZATION  2008   Mid LAD 40%, RCA 25%.   . CHOLECYSTECTOMY    . COMBINED HYSTERECTOMY ABDOMINAL W/ A&P REPAIR / OOPHORECTOMY  approx. 40 years ago   . EYE SURGERY    . MASTECTOMY  1998 & 2007    right -1998 / left 2007  . right knee replacement  09/2009   Dr. Romeo Apple  . TOTAL KNEE ARTHROPLASTY  4/08   left      OB History    Gravida      Para      Term      Preterm      AB      Living  1  SAB      TAB      Ectopic      Multiple      Live Births               Home Medications    Prior to Admission medications   Medication Sig Start Date End Date Taking? Authorizing Provider  acetaminophen (TYLENOL) 500 MG tablet Take 1,000 mg by mouth 2 (two) times daily. *May take an additional dose if needed for pain   Yes [provider]  celecoxib (CELEBREX) 100 MG capsule TAKE ONE CAPSULE BY MOUTH EVERY DAY 06/17/17  Yes [name], MD  cetirizine (ZYRTEC) 10 MG tablet TAKE 1 TABLET(10 MG) BY MOUTH DAILY 01/06/17  Yes [name], MD  donepezil (ARICEPT) 5 MG tablet TAKE 1 TABLET(5 MG) BY MOUTH AT BEDTIME 12/08/16  Yes [name], MD  gabapentin (NEURONTIN) 300 MG capsule Take one capsule in the morning an two at bedtime 05/10/17  Yes [name], MD  montelukast (SINGULAIR) 10 MG tablet Take 1 tablet (10 mg total) by mouth at  bedtime. 05/06/17  Yes [name], MD  oxybutynin (DITROPAN-XL) 5 MG 24 hr tablet Take 1 tablet (5 mg total) by mouth at bedtime. 12/08/16  Yes [name], MD  risperiDONE (RISPERDAL) 0.25 MG tablet Take 1 tablet (0.25 mg total) by mouth 3 times/day as needed-between meals & bedtime (sleep). 06/22/17   Rolland Porter, MD  sulfamethoxazole-trimethoprim (BACTRIM DS,SEPTRA DS) 800-160 MG tablet Take 1 tablet by mouth 2 (two) times daily for 3 days. 06/22/17 06/25/17  Rolland Porter, MD    Family History Family History  Problem Relation Age of Onset  . Cancer Mother 31       breast   . Cancer Father 25       prostate  . Hypertension Father   . Hypertension Sister   . Heart disease Sister   . Heart disease Sister   . Heart disease Sister     Social History Social History   Tobacco Use  . Smoking status: Former Smoker    Packs/day: 0.25    Years: 70.00    Pack years: 17.50    Types: Cigarettes    Last attempt to quit: 03/13/2015    Years since quitting: 2.2  . Smokeless tobacco: Never Used  Substance Use Topics  . Alcohol use: No    Alcohol/week: 0.0 oz  . Drug use: No     Allergies   Iohexol; Iron; and Penicillins   Review of Systems Review of Systems  Constitutional: Negative for appetite change, chills, diaphoresis, fatigue and fever.  HENT: Negative for mouth sores, sore throat and trouble swallowing.   Eyes: Negative for visual disturbance.  Respiratory: Negative for cough, chest tightness, shortness of breath and wheezing.   Cardiovascular: Negative for chest pain.  Gastrointestinal: Negative for abdominal distention, abdominal pain, diarrhea, nausea and vomiting.  Endocrine: Negative for polydipsia, polyphagia and polyuria.  Genitourinary: Positive for frequency. Negative for dysuria and hematuria.  Musculoskeletal: Negative for gait problem.  Skin: Negative for color change, pallor and rash.  Neurological: Negative for dizziness, syncope,  light-headedness and headaches.  Hematological: Does not bruise/bleed easily.  Psychiatric/Behavioral: Positive for agitation, confusion and sleep disturbance. Negative for behavioral problems.     Physical Exam Updated Vital Signs BP 140/88   Pulse (!) 56   Temp 97.9 F (36.6 C) (Oral)   Resp 16   SpO2 95%   Physical Exam  Constitutional: She appears well-developed and well-nourished. No distress.  HENT:  Head: Normocephalic.  Eyes: Pupils are equal, round, and reactive to light. Conjunctivae are normal. No scleral icterus.  Neck: Normal range of motion. Neck supple. No thyromegaly present.  Cardiovascular: Normal rate and regular rhythm. Exam reveals no gallop and no friction rub.  No murmur heard. Pulmonary/Chest: Effort normal and breath sounds normal. No respiratory distress. She has no wheezes. She has no rales.  Abdominal: Soft. Bowel sounds are normal. She exhibits no distension. There is no tenderness. There is no rebound.  Musculoskeletal: Normal range of motion.  Neurological: She is alert.  Skin: Skin is warm and dry. No rash noted.  Psychiatric: She has a normal mood and affect. Her behavior is normal.     ED Treatments / Results  Labs (all labs ordered are listed, but only abnormal results are displayed) Labs Reviewed  URINALYSIS, ROUTINE W REFLEX MICROSCOPIC - Abnormal; Notable for the following components:      Result Value   APPearance CLOUDY (*)    Hgb urine dipstick SMALL (*)    Protein, ur 30 (*)    Nitrite POSITIVE (*)    Leukocytes, UA MODERATE (*)    WBC, UA >50 (*)    Bacteria, UA MANY (*)    All other components within normal limits  CBC WITH DIFFERENTIAL/PLATELET - Abnormal; Notable for the following components:   WBC 3.9 (*)    All other components within normal limits  COMPREHENSIVE METABOLIC PANEL - Abnormal; Notable for the following components:   BUN 21 (*)    Calcium 8.4 (*)    Albumin 3.3 (*)    GFR calc non Af Amer 48 (*)    GFR  calc Af Amer 56 (*)    All other components within normal limits  URINE CULTURE    EKG None  Radiology No results found.  Procedures Procedures (including critical care time)  Medications Ordered in ED Medications  cefTRIAXone (ROCEPHIN) injection 1 g (1 g Intramuscular Given 06/22/17 1049)     Initial Impression / Assessment and Plan / ED Course  I have reviewed the triage vital signs and the nursing notes.  Pertinent labs & imaging results that were available during my care of the patient were reviewed by me and considered in my medical decision making (see chart for details).    No localizing neurological findings.  Afebrile.  Normal vital signs.  Not febrile, tachycardic, or tachypneic.  Plan lab evaluation and urinalysis.  Reevaluation after the above.  Given prescription for Bactrim DS.  Will be Risperdal nightly as needed for sleep.  Final Clinical Impressions(s) / ED Diagnoses   Final diagnoses:  Lower urinary tract infectious disease    ED Discharge Orders        Ordered    sulfamethoxazole-trimethoprim (BACTRIM DS,SEPTRA DS) 800-160 MG tablet  2 times daily     06/22/17 1244    risperiDONE (RISPERDAL) 0.25 MG tablet  2 times daily between meals and at bedtime PRN     06/22/17 1245       Rolland PorterJames, Tabithia Stroder, MD 06/22/17 1246

## 2017-06-22 NOTE — Discharge Instructions (Addendum)
Bactrim antibiotic 2 times per day for 3 days.  You may give Risperdal before bed in addition to Neurontin as needed for sleep

## 2017-06-22 NOTE — ED Triage Notes (Signed)
Patient presents with daughter. Patient is hard of hearing. Daughter states that Jennifer [name] has had urinary frequency and urgency x 2 days. History of dementia.

## 2017-06-25 LAB — URINE CULTURE

## 2017-06-26 ENCOUNTER — Telehealth: Payer: Self-pay

## 2017-06-26 NOTE — Telephone Encounter (Signed)
Post ED Visit - Positive Culture Follow-up  Culture report reviewed by antimicrobial stewardship pharmacist:  []  Jennifer [name], Pharm.D. []  Jennifer [name], Pharm.D., BCPS AQ-ID []  Jennifer [name], Pharm.D., BCPS []  Jennifer [name], [address]Pharm[address]D., BCPS []  Jennifer [name], [place] BCPS, AAHIVP []  Jennifer [name], Pharm.D., BCPS, AAHIVP [x]  Jennifer [name], PharmD, BCPS []  Jennifer [name], PharmD []  Jennifer [name], PharmD, BCPS  Positive urine culture Treated with Sulfamethoxazole- Trimethoprim, organism sensitive to the same and no further patient follow-up is required at this time.  Jennifer [name], Jennifer [name] 06/26/2017, 9:43 AM

## 2017-07-23 ENCOUNTER — Other Ambulatory Visit: Payer: Self-pay | Admitting: Family Medicine

## 2017-07-23 DIAGNOSIS — R32 Unspecified urinary incontinence: Secondary | ICD-10-CM

## 2017-08-09 ENCOUNTER — Ambulatory Visit: Payer: Medicare Other

## 2017-08-10 ENCOUNTER — Ambulatory Visit: Payer: Medicare Other | Admitting: Family Medicine

## 2017-08-19 ENCOUNTER — Ambulatory Visit (INDEPENDENT_AMBULATORY_CARE_PROVIDER_SITE_OTHER): Payer: Medicare Other | Admitting: Otolaryngology

## 2017-08-19 DIAGNOSIS — H6123 Impacted cerumen, bilateral: Secondary | ICD-10-CM

## 2017-08-26 ENCOUNTER — Other Ambulatory Visit: Payer: Self-pay | Admitting: Family Medicine

## 2017-08-30 ENCOUNTER — Ambulatory Visit (INDEPENDENT_AMBULATORY_CARE_PROVIDER_SITE_OTHER): Payer: Medicare Other | Admitting: Family Medicine

## 2017-08-30 ENCOUNTER — Encounter: Payer: Self-pay | Admitting: Family Medicine

## 2017-08-30 ENCOUNTER — Other Ambulatory Visit: Payer: Self-pay

## 2017-08-30 VITALS — BP 128/86 | HR 71 | Resp 12 | Ht 65.0 in | Wt 206.0 lb

## 2017-08-30 DIAGNOSIS — Z23 Encounter for immunization: Secondary | ICD-10-CM | POA: Diagnosis not present

## 2017-08-30 DIAGNOSIS — J449 Chronic obstructive pulmonary disease, unspecified: Secondary | ICD-10-CM

## 2017-08-30 DIAGNOSIS — F0281 Dementia in other diseases classified elsewhere with behavioral disturbance: Secondary | ICD-10-CM | POA: Diagnosis not present

## 2017-08-30 DIAGNOSIS — M159 Polyosteoarthritis, unspecified: Secondary | ICD-10-CM

## 2017-08-30 DIAGNOSIS — M150 Primary generalized (osteo)arthritis: Secondary | ICD-10-CM

## 2017-08-30 DIAGNOSIS — G301 Alzheimer's disease with late onset: Secondary | ICD-10-CM

## 2017-08-30 DIAGNOSIS — M4800 Spinal stenosis, site unspecified: Secondary | ICD-10-CM

## 2017-08-30 DIAGNOSIS — Z9181 History of falling: Secondary | ICD-10-CM

## 2017-08-30 DIAGNOSIS — F02818 Dementia in other diseases classified elsewhere, unspecified severity, with other behavioral disturbance: Secondary | ICD-10-CM

## 2017-08-30 DIAGNOSIS — R8290 Unspecified abnormal findings in urine: Secondary | ICD-10-CM

## 2017-08-30 MED ORDER — DONEPEZIL HCL 10 MG PO TABS: 10.0000 mg | ORAL_TABLET | Freq: Every day | ORAL | 5 refills | Status: DC

## 2017-08-30 NOTE — Patient Instructions (Addendum)
F/U in 2.5 to 3 months, call if you need me sooner  Flu vaccine today.    Urine being checked for infection, we will contact your daughter before the end of the week with the result and will prescribe an antibiotic only if you need it  Increase in aricept dose to 10 mg one at bedtime, which will help with memeory and connfusion   No other changes in medication doses  Be careful not to fall

## 2017-08-30 NOTE — Assessment & Plan Note (Addendum)
Reports increased agitation and confusion mainly at bedtime mMSE today, score is 13 and increase aricept dose with 3 month f/u

## 2017-08-30 NOTE — Progress Notes (Addendum)
   Jennifer BeetsVelma M [name]     MRN: [id number]      DOB: 1922-03-29   HPI [name] is here for follow up and re-evaluation of chronic medical conditions, medication management and review of any available recent lab and radiology data.  Preventive health is updated, specifically  Cancer screening and Immunization.   Questions or concerns regarding consultations or procedures which the PT has had in the interim are  addressed. The PT denies any adverse reactions to current medications since the last visit.  C/o increased agitation around 5 -pm very demanding, stating it is her house , daughter has to leave sometimes, daughter now interested in increasing the dose of her dementia medication in the hope that this will help to control her behavior Malodorous urine  Noted  ROS History is from her daughter as pt is unreliable historians Denies recent fever or chills. Denies sinus pressure, nasal congestion, ear pain or sore throat. Denies chest congestion, productive cough or wheezing. Denies chest pains, palpitations and leg swelling Denies abdominal pain, nausea, vomiting,diarrhea or constipation.    C/o joint pain, swelling and limitation in mobility. Denies headaches, seizures, numbness, or tingling.  Denies skin break down or rash.   PE  BP 128/86 (BP Location: Right Arm, Patient Position: Sitting, Cuff Size: Large)   Pulse 71   Resp 12   Ht 5\' 5"  (1.651 m)   Wt 206 lb 0.6 oz (93.5 kg)   SpO2 96% Comment: room air  BMI 34.29 kg/m   Patient alert and disoriented and in no cardiopulmonary distress.  HEENT: No facial asymmetry, EOMI,   oropharynx pink and moist.  Neck decreased ROM no JVD, no mass.  Chest: Clear to auscultation bilaterally.  CVS: S1, S2 no murmurs, no S3.Regular rate.  ABD: Soft non tender.   Ext: No edema  MS: Decreased ROM spine, shoulders, hips and knees.  Skin: Intact, no ulcerations or rash noted.  Psych: Good eye contact, normal affect. Memory loss  severe not anxious or depressed appearing.  CNS: CN 2-12 intact, power,  normal throughout.no focal deficits noted.   Assessment & Plan  Dementia with behavioral disturbance Reports increased agitation and confusion mainly at bedtime mMSE today, score is 13 and increase aricept dose with 3 month f/u  Malodorous urine Specimen to be submitted for c/s to lab, pt unable to void in the office  COPD (chronic obstructive pulmonary disease) (HCC) Stable no recent flare of breathing disturbance  At high risk for falls Home safety reviewed briefly with caregiver, pt uses assistive device at all times  Spinal stenosis Severe spinal stenosis with chronic back pain and lower extremity weakness resulting in marked impairment in mobility and recurrent falls and  Truncal weakness and unsteady gait make change in position difficult, and at times patient requires assistance in raising up and getting out of bed, hoyer lift for her safety and improved quality of life indicated and is  requested  Osteoarthritis Severe osteoarthritis of both knees , resulting in unsteady gait and high fall risk. Increased time being spent in bed as condition deteriorates overall, hoyer lift needed for patient safety and comfort and to assist caregiver in changing position of   [name] while in bed , her BMI is 34.2

## 2017-08-31 DIAGNOSIS — R8290 Unspecified abnormal findings in urine: Secondary | ICD-10-CM | POA: Diagnosis not present

## 2017-09-01 LAB — URINE CULTURE
MICRO NUMBER:: 90990500
SPECIMEN QUALITY:: ADEQUATE

## 2017-09-03 ENCOUNTER — Telehealth: Payer: Self-pay | Admitting: Family Medicine

## 2017-09-03 NOTE — Telephone Encounter (Signed)
Patient left voicemail requesting a call back to go over lab results. Cb@ [phone]

## 2017-09-03 NOTE — Telephone Encounter (Signed)
See result not , no UTI, 3 different bacteria, likely ormal flora, since no fever or chills no need to resubmit, and that would need to be catheter specimen, cant do that in the office for this pt, poor mobility, high fall/ injury risk, pls explain

## 2017-09-03 NOTE — Telephone Encounter (Signed)
Spoke with Ivette LoyalGwynn (daughter) and she has seen a big change in her mom since she was here. States she has been sleeping most of the day everyday and having a hard time walking and standing up straight. She took her for a ride today and it exhausted her just walking and getting in the car. Now that the urine is normal she wants to know what you think about this.  *I did advise her that if she noticed any alarming symptoms to take her to the ER- and that you wouldn't see this message before next week

## 2017-09-03 NOTE — Telephone Encounter (Signed)
I TOTALLY AGREE

## 2017-09-04 ENCOUNTER — Encounter: Payer: Self-pay | Admitting: Family Medicine

## 2017-09-04 NOTE — Assessment & Plan Note (Signed)
Stable no recent flare of breathing disturbance

## 2017-09-04 NOTE — Assessment & Plan Note (Signed)
Specimen to be submitted for c/s to lab, pt unable to void in the office

## 2017-09-04 NOTE — Assessment & Plan Note (Signed)
Home safety reviewed briefly with caregiver, pt uses assistive device at all times

## 2017-09-07 ENCOUNTER — Other Ambulatory Visit: Payer: Self-pay | Admitting: Family Medicine

## 2017-09-16 ENCOUNTER — Emergency Department (HOSPITAL_COMMUNITY)
Admission: EM | Admit: 2017-09-16 | Discharge: 2017-09-16 | Disposition: A | Discharge status: Home / Self Care | Payer: Medicare Other | Attending: Emergency Medicine | Admitting: Emergency Medicine

## 2017-09-16 ENCOUNTER — Other Ambulatory Visit: Payer: Self-pay

## 2017-09-16 ENCOUNTER — Encounter (HOSPITAL_COMMUNITY): Payer: Self-pay | Admitting: Emergency Medicine

## 2017-09-16 ENCOUNTER — Telehealth: Payer: Self-pay

## 2017-09-16 ENCOUNTER — Emergency Department (HOSPITAL_COMMUNITY): Payer: Medicare Other

## 2017-09-16 DIAGNOSIS — M25561 Pain in right knee: Secondary | ICD-10-CM | POA: Insufficient documentation

## 2017-09-16 DIAGNOSIS — I2510 Atherosclerotic heart disease of native coronary artery without angina pectoris: Secondary | ICD-10-CM | POA: Diagnosis not present

## 2017-09-16 DIAGNOSIS — J449 Chronic obstructive pulmonary disease, unspecified: Secondary | ICD-10-CM | POA: Diagnosis not present

## 2017-09-16 DIAGNOSIS — R5381 Other malaise: Secondary | ICD-10-CM | POA: Diagnosis not present

## 2017-09-16 DIAGNOSIS — S8991XA Unspecified injury of right lower leg, initial encounter: Secondary | ICD-10-CM | POA: Diagnosis not present

## 2017-09-16 DIAGNOSIS — Z96653 Presence of artificial knee joint, bilateral: Secondary | ICD-10-CM | POA: Diagnosis not present

## 2017-09-16 DIAGNOSIS — M545 Low back pain, unspecified: Secondary | ICD-10-CM

## 2017-09-16 DIAGNOSIS — W19XXXA Unspecified fall, initial encounter: Secondary | ICD-10-CM | POA: Diagnosis not present

## 2017-09-16 DIAGNOSIS — Z87891 Personal history of nicotine dependence: Secondary | ICD-10-CM | POA: Insufficient documentation

## 2017-09-16 DIAGNOSIS — Z79899 Other long term (current) drug therapy: Secondary | ICD-10-CM | POA: Diagnosis not present

## 2017-09-16 DIAGNOSIS — S8992XA Unspecified injury of left lower leg, initial encounter: Secondary | ICD-10-CM | POA: Diagnosis not present

## 2017-09-16 DIAGNOSIS — S3992XA Unspecified injury of lower back, initial encounter: Secondary | ICD-10-CM | POA: Diagnosis not present

## 2017-09-16 DIAGNOSIS — M25562 Pain in left knee: Secondary | ICD-10-CM | POA: Diagnosis not present

## 2017-09-16 DIAGNOSIS — F0390 Unspecified dementia without behavioral disturbance: Secondary | ICD-10-CM | POA: Diagnosis not present

## 2017-09-16 LAB — CBC
HCT: 40.2 % (ref 36.0–46.0)
Hemoglobin: 12.5 g/dL (ref 12.0–15.0)
MCH: 31.3 pg (ref 26.0–34.0)
MCHC: 31.1 g/dL (ref 30.0–36.0)
MCV: 100.8 fL — ABNORMAL HIGH (ref 78.0–100.0)
PLATELETS: 169 10*3/uL (ref 150–400)
RBC: 3.99 MIL/uL (ref 3.87–5.11)
RDW: 13.9 % (ref 11.5–15.5)
WBC: 4.9 10*3/uL (ref 4.0–10.5)

## 2017-09-16 LAB — COMPREHENSIVE METABOLIC PANEL
ALT: 14 U/L (ref 0–44)
AST: 18 U/L (ref 15–41)
Albumin: 3.2 g/dL — ABNORMAL LOW (ref 3.5–5.0)
Alkaline Phosphatase: 82 U/L (ref 38–126)
Anion gap: 4 — ABNORMAL LOW (ref 5–15)
BILIRUBIN TOTAL: 0.6 mg/dL (ref 0.3–1.2)
BUN: 21 mg/dL (ref 8–23)
CALCIUM: 8.2 mg/dL — AB (ref 8.9–10.3)
CO2: 29 mmol/L (ref 22–32)
Chloride: 110 mmol/L (ref 98–111)
Creatinine, Ser: 0.97 mg/dL (ref 0.44–1.00)
GFR, EST AFRICAN AMERICAN: 57 mL/min — AB (ref >60)
GFR, EST NON AFRICAN AMERICAN: 49 mL/min — AB (ref >60)
Glucose, Bld: 103 mg/dL — ABNORMAL HIGH (ref 70–99)
Potassium: 3.9 mmol/L (ref 3.5–5.1)
Sodium: 143 mmol/L (ref 135–145)
TOTAL PROTEIN: 6.6 g/dL (ref 6.5–8.1)

## 2017-09-16 NOTE — Care Management Note (Signed)
Case Management Note  Patient Details  Name: WAKITA LABERGE MRN: [id number] Date of Birth: 1922-04-05      In ED with falls. Has dementia, lives with daughter, has supervision most of the day but daughter does run out for errands. Pt has RW and WC. No inpt stay to qualify for SNF. Daughter okay with trying HH. No preference of provider. Okay with [facility], aware they have 48 hrs to make first visit. Daughter needs hoyer lift, okay with using [facility] also. Bonita Quin, [facility] rep given referrals. Daughter requests info about PD care. List provided via e-mail to daughter.                 Expected Discharge Date:     09/16/17             Expected Discharge Plan:  Home w Home Health Services  In-House Referral:  NA  Discharge planning Services  CM Consult  Post Acute Care Choice:  Home Health Choice offered to:  Adult Children  DME Arranged:  Other see comment(hoyer lift) DME Agency:  [facility]  HH Arranged:  RN, PT, Social Work [facility] Agency:  [facility]  Status of Service:  Completed, signed off  If discussed at [employer], dates discussed:    Additional Comments:  Malcolm Metro, RN 09/16/2017, 4:18 PM

## 2017-09-16 NOTE — Clinical Social Work Note (Signed)
LCSW met with patient and daughter and discussed that because patient did not have the required three night inpatient stay based on medicare guidelines insurance would not cover SNF.   LCSW discussed private pay. Daughter advised that they could not afford to private pay for SNF.   LCSW discussed HHPT as an option.    Case management met with family and will address needs as they are available.    Daryan Buell, Clydene Pugh, LCSW

## 2017-09-16 NOTE — Discharge Instructions (Addendum)
X-ray of the left knee shows a questionable very small separation of the covering of the bone of the fibula.  This is the bone on the outside of the leg.  Elevate, ice, follow-up with primary care doctor.

## 2017-09-16 NOTE — ED Triage Notes (Addendum)
Pt's daughter states pt has falling twice this week, increased generalized pain and confusion.  Daughter unaware of pt hitting her head

## 2017-09-16 NOTE — ED Provider Notes (Signed)
[facility] EMERGENCY DEPARTMENT Provider Note   CSN: [id number] Arrival date & time: 09/16/17  1259     History   Chief Complaint Chief Complaint  Patient presents with  . Fall    HPI Cherolyn Judie Petit Scalici is a 84 y.o. female.  Level 5 caveat for mild dementia.  Most of history obtained from daughter.  Patient has fallen twice this week.  Daughter describes a pattern of decline over the past several months with intermittent cognitive deficits and gait instability.  Daughter is the primary caregiver.  Patient has a good appetite.  No fever, sweats, chills, chest pain, dyspnea, dysuria.  No head or neck trauma.  Patient complains of left lower back and bilateral knee pain.     Past Medical History:  Diagnosis Date  . Anemia    iron deficiency post op.  Marland Kitchen CAD (coronary artery disease) 2008   Cath 40% mid LAD, 35% RCA  . Cellulitis of finger    right   . Chest pain 2006  . Chronic back pain   . COPD (chronic obstructive pulmonary disease) (HCC)   . Gastroesophageal reflux disease   . Hiatal hernia   . Hyperlipidemia   . Nicotine addiction   . Osteoarthritis    status post left TKA; surgery on the right is anticipated in the near future   . Pulmonary nodules    stable since 2006  . Shingles    right breast   . Skin infection   . Ulcer    gential  . Use of cane as ambulatory aid     Patient Active Problem List   Diagnosis Date Noted  . Malodorous urine 08/30/2017  . Burnout of caregiver 07/21/2016  . DNR no code (do not resuscitate) 04/25/2014  . Urinary incontinence 04/04/2013  . Osteoporosis, post-menopausal 12/06/2012  . At high risk for falls 07/30/2012  . Dementia with behavioral disturbance 08/19/2011  . COPD (chronic obstructive pulmonary disease) (HCC) 06/25/2011  . Allergic rhinitis 12/23/2010  . CEREBROVASCULAR DISEASE 08/05/2009  . Spinal stenosis 06/09/2007  . Coronary atherosclerosis 01/25/2007  . HIATAL HERNIA WITH REFLUX 01/25/2007  .  Osteoarthritis 01/25/2007    Past Surgical History:  Procedure Laterality Date  . ABDOMINAL HYSTERECTOMY    . BREAST SURGERY N/A    bilateral  . CARDIAC CATHETERIZATION  2008   Mid LAD 40%, RCA 25%.   . CHOLECYSTECTOMY    . COMBINED HYSTERECTOMY ABDOMINAL W/ A&P REPAIR / OOPHORECTOMY  approx. 40 years ago   . EYE SURGERY    . MASTECTOMY  1998 & 2007    right -1998 / left 2007  . right knee replacement  09/2009   Dr. Romeo Apple  . TOTAL KNEE ARTHROPLASTY  4/08   left      OB History    Gravida      Para      Term      Preterm      AB      Living  1     SAB      TAB      Ectopic      Multiple      Live Births               Home Medications    Prior to Admission medications   Medication Sig Start Date End Date Taking? Authorizing Provider  acetaminophen (TYLENOL) 500 MG tablet Take 1,000 mg by mouth 2 (two) times daily. *May take an additional dose if needed  for pain   Yes [provider]  celecoxib (CELEBREX) 100 MG capsule TAKE ONE CAPSULE BY MOUTH EVERY DAY 06/17/17  Yes [name], MD  cetirizine (ZYRTEC) 10 MG tablet TAKE 1 TABLET(10 MG) BY MOUTH DAILY 01/06/17  Yes [name], MD  donepezil (ARICEPT) 10 MG tablet Take 1 tablet (10 mg total) by mouth at bedtime. 08/30/17  Yes [name], MD  gabapentin (NEURONTIN) 300 MG capsule TAKE 1 CAPSULE BY MOUTH EVERY MORNING AND TAKE 2 CAPSULES BY MOUTH EVERY NIGHT AT BEDTIME 09/07/17  Yes [name], MD  montelukast (SINGULAIR) 10 MG tablet Take 1 tablet (10 mg total) by mouth at bedtime. 05/06/17  Yes [name], MD  oxybutynin (DITROPAN-XL) 5 MG 24 hr tablet TAKE 1 TABLET BY MOUTH EVERY NIGHT AT BEDTIME 07/23/17  Yes [name], MD    Family History Family History  Problem Relation Age of Onset  . Cancer Mother 45       breast   . Cancer Father 63       prostate  . Hypertension Father   . Hypertension Sister   . Heart disease Sister   . Heart  disease Sister   . Heart disease Sister     Social History Social History   Tobacco Use  . Smoking status: Former Smoker    Packs/day: 0.25    Years: 70.00    Pack years: 17.50    Types: Cigarettes    Last attempt to quit: 03/13/2015    Years since quitting: 2.5  . Smokeless tobacco: Never Used  Substance Use Topics  . Alcohol use: No    Alcohol/week: 0.0 standard drinks  . Drug use: No     Allergies   Iohexol; Iron; and Penicillins   Review of Systems Review of Systems  Unable to perform ROS: Dementia     Physical Exam Updated Vital Signs BP (!) 163/74 (BP Location: Right Arm)   Pulse (!) 57   Temp 98.4 F (36.9 C) (Oral)   Resp 14   Ht 5\' 5"  (1.651 m)   Wt 93.4 kg   SpO2 100%   BMI 34.28 kg/m   Physical Exam  Constitutional: She is oriented to person, place, and time.  Elevated BMI, hard of hearing, answers questions reasonably well.  HENT:  Head: Normocephalic and atraumatic.  Eyes: Conjunctivae are normal.  Neck: Neck supple.  Cardiovascular: Normal rate and regular rhythm.  Pulmonary/Chest: Effort normal and breath sounds normal.  Abdominal: Soft. Bowel sounds are normal.  Musculoskeletal:  Tender left lower back, bilateral anterior knees  Neurological: She is alert and oriented to person, place, and time.  Skin: Skin is warm and dry.  Psychiatric: She has a normal mood and affect. Her behavior is normal.  Nursing note and vitals reviewed.    ED Treatments / Results  Labs (all labs ordered are listed, but only abnormal results are displayed) Labs Reviewed  COMPREHENSIVE METABOLIC PANEL - Abnormal; Notable for the following components:      Result Value   Glucose, Bld 103 (*)    Calcium 8.2 (*)    Albumin 3.2 (*)    GFR calc non Af Amer 49 (*)    GFR calc Af Amer 57 (*)    Anion gap 4 (*)    All other components within normal limits  CBC - Abnormal; Notable for the following components:   MCV 100.8 (*)    All other components within  normal  limits  URINALYSIS, ROUTINE W REFLEX MICROSCOPIC    EKG None  Radiology Dg Lumbar Spine Complete  Result Date: 09/16/2017 CLINICAL DATA:  Fall with low back pain EXAM: LUMBAR SPINE - COMPLETE 4+ VIEW COMPARISON:  12/07/2014 FINDINGS: Lumbar alignment within normal limits. Vertebral body heights are maintained. Moderate-to-marked diffuse degenerative changes with disc space narrowing and bulky anterior osteophytes. Vacuum discs at multiple levels. Aortic atherosclerosis. IMPRESSION: Diffuse degenerative changes.  No acute osseous abnormality. Electronically Signed   By: Jasmine Pang M.D.   On: 09/16/2017 15:33   Dg Knee Complete 4 Views Left  Result Date: 09/16/2017 CLINICAL DATA:  Fall with knee pain EXAM: LEFT KNEE - COMPLETE 4+ VIEW COMPARISON:  None. FINDINGS: Status post left knee replacement. No malalignment. Questionable cortical avulsion off the posterior fibular head. No significant knee effusion. Vascular calcification. IMPRESSION: Status post left knee replacement. Questionable cortical avulsion off the posterior fibular head. Electronically Signed   By: Jasmine Pang M.D.   On: 09/16/2017 15:31   Dg Knee Complete 4 Views Right  Result Date: 09/16/2017 CLINICAL DATA:  Fall EXAM: RIGHT KNEE - COMPLETE 4+ VIEW COMPARISON:  09/10/2015 FINDINGS: Status post right knee replacement with intact hardware. No fracture seen. No large knee effusion. IMPRESSION: No acute osseous abnormality.  Right knee replacement. Electronically Signed   By: Jasmine Pang M.D.   On: 09/16/2017 15:29    Procedures Procedures (including critical care time)  Medications Ordered in ED Medications - No data to display   Initial Impression / Assessment and Plan / ED Course  I have reviewed the triage vital signs and the nursing notes.  Pertinent labs & imaging results that were available during my care of the patient were reviewed by me and considered in my medical decision making (see chart for  details).     Daughter reports a cognitive and physical decline over several months and recent falls.  Labs were acceptable.  Plain films of lumbar spine, right knee left knee obtained.  On the left knee x-ray there is a questionable cortical avulsion of the posterior superior fibula.  These findings were discussed with the daughter.  Social work consult was obtained for home health care.  Patient will follow-up with her primary care doctor  Final Clinical Impressions(s) / ED Diagnoses   Final diagnoses:  Fall, initial encounter  Acute pain of left knee  Acute pain of right knee  Acute left-sided low back pain without sciatica    ED Discharge Orders    None       Donnetta Hutching, MD 09/16/17 1700

## 2017-09-16 NOTE — Telephone Encounter (Signed)
I spoke with the daughter and had her understand that with multiple falls , her mother is not safe at home I recommended she either call SNF where she was expecting placement , or take her to the ED

## 2017-09-16 NOTE — Telephone Encounter (Signed)
Daughter was told her mother needed to be evaluated at the ER and she states it wasn't an acute problem but a gradual decline with her strength and her mental state and she became tearful and overwhelmed at what to do and asked to speak directly to you. I told her I would relay the message [phone]

## 2017-09-19 ENCOUNTER — Other Ambulatory Visit: Payer: Self-pay | Admitting: Family Medicine

## 2017-09-19 DIAGNOSIS — Z9181 History of falling: Secondary | ICD-10-CM | POA: Diagnosis not present

## 2017-09-19 DIAGNOSIS — R2681 Unsteadiness on feet: Secondary | ICD-10-CM | POA: Diagnosis not present

## 2017-09-19 DIAGNOSIS — Z79899 Other long term (current) drug therapy: Secondary | ICD-10-CM | POA: Diagnosis not present

## 2017-09-19 DIAGNOSIS — M1991 Primary osteoarthritis, unspecified site: Secondary | ICD-10-CM | POA: Diagnosis not present

## 2017-09-19 DIAGNOSIS — K219 Gastro-esophageal reflux disease without esophagitis: Secondary | ICD-10-CM | POA: Diagnosis not present

## 2017-09-19 DIAGNOSIS — M810 Age-related osteoporosis without current pathological fracture: Secondary | ICD-10-CM | POA: Diagnosis not present

## 2017-09-19 DIAGNOSIS — Z87891 Personal history of nicotine dependence: Secondary | ICD-10-CM | POA: Diagnosis not present

## 2017-09-19 DIAGNOSIS — Z96653 Presence of artificial knee joint, bilateral: Secondary | ICD-10-CM | POA: Diagnosis not present

## 2017-09-19 DIAGNOSIS — R32 Unspecified urinary incontinence: Secondary | ICD-10-CM | POA: Diagnosis not present

## 2017-09-19 DIAGNOSIS — K449 Diaphragmatic hernia without obstruction or gangrene: Secondary | ICD-10-CM | POA: Diagnosis not present

## 2017-09-19 DIAGNOSIS — W19XXXD Unspecified fall, subsequent encounter: Secondary | ICD-10-CM | POA: Diagnosis not present

## 2017-09-19 DIAGNOSIS — F0391 Unspecified dementia with behavioral disturbance: Secondary | ICD-10-CM | POA: Diagnosis not present

## 2017-09-19 DIAGNOSIS — R296 Repeated falls: Secondary | ICD-10-CM | POA: Diagnosis not present

## 2017-09-19 DIAGNOSIS — J449 Chronic obstructive pulmonary disease, unspecified: Secondary | ICD-10-CM | POA: Diagnosis not present

## 2017-09-19 DIAGNOSIS — Z9013 Acquired absence of bilateral breasts and nipples: Secondary | ICD-10-CM | POA: Diagnosis not present

## 2017-09-19 DIAGNOSIS — M4800 Spinal stenosis, site unspecified: Secondary | ICD-10-CM | POA: Diagnosis not present

## 2017-09-19 DIAGNOSIS — I679 Cerebrovascular disease, unspecified: Secondary | ICD-10-CM | POA: Diagnosis not present

## 2017-09-19 DIAGNOSIS — I2510 Atherosclerotic heart disease of native coronary artery without angina pectoris: Secondary | ICD-10-CM | POA: Diagnosis not present

## 2017-09-20 ENCOUNTER — Telehealth: Payer: Self-pay | Admitting: Family Medicine

## 2017-09-20 NOTE — Telephone Encounter (Signed)
Verify orders for skilled nursing 1 @ wk for 9 wks

## 2017-09-20 NOTE — Telephone Encounter (Signed)
Number is incorrect. Will send fax

## 2017-09-21 DIAGNOSIS — F0391 Unspecified dementia with behavioral disturbance: Secondary | ICD-10-CM | POA: Diagnosis not present

## 2017-09-21 DIAGNOSIS — M4800 Spinal stenosis, site unspecified: Secondary | ICD-10-CM | POA: Diagnosis not present

## 2017-09-21 DIAGNOSIS — R2681 Unsteadiness on feet: Secondary | ICD-10-CM | POA: Diagnosis not present

## 2017-09-21 DIAGNOSIS — J449 Chronic obstructive pulmonary disease, unspecified: Secondary | ICD-10-CM | POA: Diagnosis not present

## 2017-09-21 DIAGNOSIS — R296 Repeated falls: Secondary | ICD-10-CM | POA: Diagnosis not present

## 2017-09-21 DIAGNOSIS — I2510 Atherosclerotic heart disease of native coronary artery without angina pectoris: Secondary | ICD-10-CM | POA: Diagnosis not present

## 2017-09-28 DIAGNOSIS — K219 Gastro-esophageal reflux disease without esophagitis: Secondary | ICD-10-CM | POA: Diagnosis not present

## 2017-09-28 DIAGNOSIS — M4800 Spinal stenosis, site unspecified: Secondary | ICD-10-CM | POA: Diagnosis not present

## 2017-09-28 DIAGNOSIS — I679 Cerebrovascular disease, unspecified: Secondary | ICD-10-CM | POA: Diagnosis not present

## 2017-09-28 DIAGNOSIS — J449 Chronic obstructive pulmonary disease, unspecified: Secondary | ICD-10-CM | POA: Diagnosis not present

## 2017-09-28 DIAGNOSIS — W19XXXD Unspecified fall, subsequent encounter: Secondary | ICD-10-CM

## 2017-09-28 DIAGNOSIS — R32 Unspecified urinary incontinence: Secondary | ICD-10-CM | POA: Diagnosis not present

## 2017-09-28 DIAGNOSIS — R2681 Unsteadiness on feet: Secondary | ICD-10-CM | POA: Diagnosis not present

## 2017-09-28 DIAGNOSIS — K449 Diaphragmatic hernia without obstruction or gangrene: Secondary | ICD-10-CM | POA: Diagnosis not present

## 2017-09-28 DIAGNOSIS — R296 Repeated falls: Secondary | ICD-10-CM | POA: Diagnosis not present

## 2017-09-28 DIAGNOSIS — M1991 Primary osteoarthritis, unspecified site: Secondary | ICD-10-CM | POA: Diagnosis not present

## 2017-09-28 DIAGNOSIS — I2510 Atherosclerotic heart disease of native coronary artery without angina pectoris: Secondary | ICD-10-CM | POA: Diagnosis not present

## 2017-09-28 DIAGNOSIS — F0391 Unspecified dementia with behavioral disturbance: Secondary | ICD-10-CM | POA: Diagnosis not present

## 2017-09-28 DIAGNOSIS — M810 Age-related osteoporosis without current pathological fracture: Secondary | ICD-10-CM

## 2017-09-28 DIAGNOSIS — Z9181 History of falling: Secondary | ICD-10-CM

## 2017-09-29 ENCOUNTER — Telehealth: Payer: Self-pay | Admitting: Family Medicine

## 2017-09-29 NOTE — Telephone Encounter (Signed)
Jennifer StanleyStacey at [facility]  Left voicemail to verify plan of care.  2x a week for 4 weeks. She is also requesting a hoyer lift for patient for days that are harder to get her up & about and reduce the risk of falls. Fax# [phone] Phone# [phone] Confidential voicemail, may leave detailed messages.

## 2017-09-30 DIAGNOSIS — J449 Chronic obstructive pulmonary disease, unspecified: Secondary | ICD-10-CM | POA: Diagnosis not present

## 2017-09-30 DIAGNOSIS — F0391 Unspecified dementia with behavioral disturbance: Secondary | ICD-10-CM | POA: Diagnosis not present

## 2017-09-30 DIAGNOSIS — R296 Repeated falls: Secondary | ICD-10-CM | POA: Diagnosis not present

## 2017-09-30 DIAGNOSIS — R2681 Unsteadiness on feet: Secondary | ICD-10-CM | POA: Diagnosis not present

## 2017-09-30 DIAGNOSIS — M4800 Spinal stenosis, site unspecified: Secondary | ICD-10-CM | POA: Diagnosis not present

## 2017-09-30 DIAGNOSIS — I2510 Atherosclerotic heart disease of native coronary artery without angina pectoris: Secondary | ICD-10-CM | POA: Diagnosis not present

## 2017-10-01 DIAGNOSIS — R2681 Unsteadiness on feet: Secondary | ICD-10-CM | POA: Diagnosis not present

## 2017-10-01 DIAGNOSIS — M4800 Spinal stenosis, site unspecified: Secondary | ICD-10-CM | POA: Diagnosis not present

## 2017-10-01 DIAGNOSIS — I2510 Atherosclerotic heart disease of native coronary artery without angina pectoris: Secondary | ICD-10-CM | POA: Diagnosis not present

## 2017-10-01 DIAGNOSIS — F0391 Unspecified dementia with behavioral disturbance: Secondary | ICD-10-CM | POA: Diagnosis not present

## 2017-10-01 DIAGNOSIS — R296 Repeated falls: Secondary | ICD-10-CM | POA: Diagnosis not present

## 2017-10-01 DIAGNOSIS — J449 Chronic obstructive pulmonary disease, unspecified: Secondary | ICD-10-CM | POA: Diagnosis not present

## 2017-10-04 DIAGNOSIS — I2510 Atherosclerotic heart disease of native coronary artery without angina pectoris: Secondary | ICD-10-CM | POA: Diagnosis not present

## 2017-10-04 DIAGNOSIS — F0391 Unspecified dementia with behavioral disturbance: Secondary | ICD-10-CM | POA: Diagnosis not present

## 2017-10-04 DIAGNOSIS — R2681 Unsteadiness on feet: Secondary | ICD-10-CM | POA: Diagnosis not present

## 2017-10-04 DIAGNOSIS — M4800 Spinal stenosis, site unspecified: Secondary | ICD-10-CM | POA: Diagnosis not present

## 2017-10-04 DIAGNOSIS — R296 Repeated falls: Secondary | ICD-10-CM | POA: Diagnosis not present

## 2017-10-04 DIAGNOSIS — J449 Chronic obstructive pulmonary disease, unspecified: Secondary | ICD-10-CM | POA: Diagnosis not present

## 2017-10-06 DIAGNOSIS — M4800 Spinal stenosis, site unspecified: Secondary | ICD-10-CM | POA: Diagnosis not present

## 2017-10-06 DIAGNOSIS — J449 Chronic obstructive pulmonary disease, unspecified: Secondary | ICD-10-CM | POA: Diagnosis not present

## 2017-10-06 DIAGNOSIS — F0391 Unspecified dementia with behavioral disturbance: Secondary | ICD-10-CM | POA: Diagnosis not present

## 2017-10-06 DIAGNOSIS — R2681 Unsteadiness on feet: Secondary | ICD-10-CM | POA: Diagnosis not present

## 2017-10-06 DIAGNOSIS — R296 Repeated falls: Secondary | ICD-10-CM | POA: Diagnosis not present

## 2017-10-06 DIAGNOSIS — I2510 Atherosclerotic heart disease of native coronary artery without angina pectoris: Secondary | ICD-10-CM | POA: Diagnosis not present

## 2017-10-12 ENCOUNTER — Other Ambulatory Visit: Payer: Self-pay

## 2017-10-12 MED ORDER — UNABLE TO FIND: 0 refills | Status: DC

## 2017-10-12 NOTE — Telephone Encounter (Signed)
pls arrange for hoyer lift use last office note I will make any necessary addendum

## 2017-10-12 NOTE — Telephone Encounter (Signed)
pls approve plan of care and follow through with hoyer as before

## 2017-10-12 NOTE — Telephone Encounter (Signed)
[facility] to [facility]. They will need office notes addended to state need for hoyer. Will fax once complete

## 2017-10-13 DIAGNOSIS — M4800 Spinal stenosis, site unspecified: Secondary | ICD-10-CM | POA: Diagnosis not present

## 2017-10-13 DIAGNOSIS — I2510 Atherosclerotic heart disease of native coronary artery without angina pectoris: Secondary | ICD-10-CM | POA: Diagnosis not present

## 2017-10-13 DIAGNOSIS — R2681 Unsteadiness on feet: Secondary | ICD-10-CM | POA: Diagnosis not present

## 2017-10-13 DIAGNOSIS — J449 Chronic obstructive pulmonary disease, unspecified: Secondary | ICD-10-CM | POA: Diagnosis not present

## 2017-10-13 DIAGNOSIS — F0391 Unspecified dementia with behavioral disturbance: Secondary | ICD-10-CM | POA: Diagnosis not present

## 2017-10-13 DIAGNOSIS — R296 Repeated falls: Secondary | ICD-10-CM | POA: Diagnosis not present

## 2017-10-14 NOTE — Telephone Encounter (Signed)
Let me know when office note is addended for hoyer lift. [facility] is requesting it

## 2017-10-14 NOTE — Telephone Encounter (Signed)
[facility] is calling again, they need the notes faxed to [phone]

## 2017-10-17 NOTE — Assessment & Plan Note (Signed)
Severe spinal stenosis with chronic back pain and lower extremity weakness resulting in marked impairment in mobility and recurrent falls and  Truncal weakness and unsteady gait make change in position difficult, and at times patient requires assistance in raising up and getting out of bed, hoyer lift for her safety and improved quality of life indicated and is  requested

## 2017-10-17 NOTE — Assessment & Plan Note (Signed)
Severe osteoarthritis of both knees , resulting in unsteady gait and high fall risk. Increased time being spent in bed as condition deteriorates overall, hoyer lift needed for patient safety and comfort and to assist caregiver in changing position of   Jennifer [name] while in bed , her BMI is 34.2

## 2017-10-17 NOTE — Telephone Encounter (Signed)
Note is a addend ed, pls send to [facility]

## 2017-10-20 NOTE — Telephone Encounter (Signed)
sent 

## 2017-11-08 ENCOUNTER — Ambulatory Visit (INDEPENDENT_AMBULATORY_CARE_PROVIDER_SITE_OTHER): Payer: Medicare Other | Admitting: Family Medicine

## 2017-11-08 ENCOUNTER — Encounter: Payer: Self-pay | Admitting: Family Medicine

## 2017-11-08 VITALS — BP 124/68 | HR 84 | Resp 12 | Ht 65.0 in | Wt 206.0 lb

## 2017-11-08 DIAGNOSIS — W19XXXA Unspecified fall, initial encounter: Secondary | ICD-10-CM | POA: Diagnosis not present

## 2017-11-08 DIAGNOSIS — G301 Alzheimer's disease with late onset: Secondary | ICD-10-CM | POA: Diagnosis not present

## 2017-11-08 DIAGNOSIS — M48061 Spinal stenosis, lumbar region without neurogenic claudication: Secondary | ICD-10-CM

## 2017-11-08 DIAGNOSIS — J449 Chronic obstructive pulmonary disease, unspecified: Secondary | ICD-10-CM

## 2017-11-08 DIAGNOSIS — M549 Dorsalgia, unspecified: Secondary | ICD-10-CM | POA: Diagnosis not present

## 2017-11-08 DIAGNOSIS — F0281 Dementia in other diseases classified elsewhere with behavioral disturbance: Secondary | ICD-10-CM | POA: Diagnosis not present

## 2017-11-08 DIAGNOSIS — Y92009 Unspecified place in unspecified non-institutional (private) residence as the place of occurrence of the external cause: Secondary | ICD-10-CM

## 2017-11-08 DIAGNOSIS — R404 Transient alteration of awareness: Secondary | ICD-10-CM | POA: Diagnosis not present

## 2017-11-08 DIAGNOSIS — F02818 Dementia in other diseases classified elsewhere, unspecified severity, with other behavioral disturbance: Secondary | ICD-10-CM

## 2017-11-08 DIAGNOSIS — Z730 Burn-out: Secondary | ICD-10-CM | POA: Diagnosis not present

## 2017-11-08 MED ORDER — KETOROLAC TROMETHAMINE 60 MG/2ML IM SOLN
60.0000 mg | Freq: Once | INTRAMUSCULAR | Status: AC
  Administered 2017-11-08: 60 mg via INTRAMUSCULAR

## 2017-11-08 NOTE — Patient Instructions (Addendum)
F/U in 4.5 months, call if you need me before  Due to recurrent falls I will contact [facility] and call your daughter [name]   Meds as discussed  Toradol 60 mg Im in office todayfor back and generalized pain

## 2017-11-08 NOTE — Progress Notes (Signed)
   Jennifer [name]     MRN: [id number]      DOB: 07/10/1922   HPI Jennifer [name] is here for follow up and re-evaluation of chronic medical conditions, medication management and review of any available recent lab and radiology data.  Preventive health is updated, specifically  Cancer screening and Immunization.   Questions or concerns regarding consultations or procedures which the PT has had in the interim are  addressed. Daughter reports increased confusion when aricept dose was increased so she has topped the medication entirely , with no interest in resuming.  Falling on average 2 times per month, not  much success within home pT, fell this morning daughter considering I aggressive pT shor term EMS sent her here, no bruise, found flat on her back in bathroom at the sink, she fell at the sink  ROS Denies recent fever or chills. Denies sinus pressure, nasal congestion, ear pain or sore throat. Denies chest congestion, productive cough or wheezing. Denies chest pains, palpitations and leg swelling Denies abdominal pain, nausea, vomiting,diarrhea or constipation.   Denies dysuria, frequency, hesitancy or incontinence. C/o increased generalized joint pain, swelling and limitation in mobility. Denies headaches, seizures, numbness, or tingling. Denies depression, anxiety or insomnia. Denies skin break down or rash.   PE  BP 124/68 (BP Location: Left Arm, Patient Position: Sitting, Cuff Size: Large)   Pulse 84   Resp 12   Ht 5\' 5"  (1.651 m)   Wt 206 lb 0.6 oz (93.5 kg)   SpO2 93% Comment: room air  BMI 34.29 kg/m   Patient alert  and in no cardiopulmonary distress.  HEENT: No facial asymmetry, EOMI,   oropharynx pink and moist.  Neck supple no JVD, no mass.  Chest: Clear to auscultation bilaterally.Decreased air entry throughout, npo wheeze or crackles  CVS: S1, S2 no murmurs, no S3.Regular rate.  ABD: Soft non tender.   Ext: No edema  MS: Markedly decreased ROM spine,  shoulders, hips and knees.  Skin: Intact, no ulcerations or rash noted.  Psych: Good eye contact, normal affect. Memory loss not anxious or depressed appearing.  CNS: CN 2-12 intact, power,  normal throughout.no focal deficits noted.   Assessment & Plan Dementia with behavioral disturbance Reports adverse reaction to higher dose of aricept, and discontinued same. management is without any medication at this ime  Spinal stenosis Increased and uncontrolled back and gemneralized joint pain , including knees , s/p fall , toradol 60 mg IM  in office today  Fall at home, initial encounter Daughter reports Fall at home earlier in the day, requiring EMS to raise her from the ground. Landed on buttock, no bruise or skin breakdown or laceration noted. Home safety reviewed.  Remains at very high fall risk, will reach out to SNF for placement to improve strength, and gait hence reduce fall risk  Burnout of caregiver Caregiver, daughter reports burnout, needs Mother placed for strengthening and improvement in gait, will reach out to SNF , [facility] social worker to assist with this  COPD (chronic obstructive pulmonary disease) (HCC) Stable with no flare

## 2017-11-11 ENCOUNTER — Telehealth: Payer: Self-pay | Admitting: Family Medicine

## 2017-11-11 DIAGNOSIS — N3000 Acute cystitis without hematuria: Secondary | ICD-10-CM

## 2017-11-11 NOTE — Telephone Encounter (Signed)
Daughter wants her mom checked for a UTI. Forgot to ask you at her appt but she has a strong urine odor and has been more confused and not eating as much and wanting to sleep a lot. Wants to come collect the specimen cup and hat and collect at home and return if you agree.

## 2017-11-11 NOTE — Telephone Encounter (Signed)
Send urine for ccu and c/s order to be done at lab that is [facility], pls arrange

## 2017-11-11 NOTE — Telephone Encounter (Signed)
Pts daughter is requested a Urine test for possible UTI--Please advise

## 2017-11-12 DIAGNOSIS — N3000 Acute cystitis without hematuria: Secondary | ICD-10-CM | POA: Diagnosis not present

## 2017-11-12 NOTE — Telephone Encounter (Signed)
Patient daughter aware and will come collect cup and order

## 2017-11-12 NOTE — Telephone Encounter (Signed)
Sending this your way!

## 2017-11-12 NOTE — Addendum Note (Signed)
Addended by: Abner Greenspan on: 11/12/2017 08:48 AM   Modules accepted: Orders

## 2017-11-13 ENCOUNTER — Other Ambulatory Visit: Payer: Self-pay | Admitting: Family Medicine

## 2017-11-13 ENCOUNTER — Telehealth: Payer: Self-pay | Admitting: Family Medicine

## 2017-11-13 MED ORDER — CIPROFLOXACIN HCL 500 MG PO TABS: 500.0000 mg | ORAL_TABLET | Freq: Two times a day (BID) | ORAL | 0 refills | Status: DC

## 2017-11-13 NOTE — Telephone Encounter (Signed)
Message left on both telephone numbers to call back,ciprofloxacin is prescried

## 2017-11-13 NOTE — Progress Notes (Signed)
For presumed UTI

## 2017-11-14 ENCOUNTER — Encounter: Payer: Self-pay | Admitting: Family Medicine

## 2017-11-14 DIAGNOSIS — W19XXXA Unspecified fall, initial encounter: Secondary | ICD-10-CM | POA: Insufficient documentation

## 2017-11-14 DIAGNOSIS — Y92009 Unspecified place in unspecified non-institutional (private) residence as the place of occurrence of the external cause: Secondary | ICD-10-CM

## 2017-11-14 NOTE — Assessment & Plan Note (Signed)
Daughter reports Fall at home earlier in the day, requiring EMS to raise her from the ground. Landed on buttock, no bruise or skin breakdown or laceration noted. Home safety reviewed.  Remains at very high fall risk, will reach out to SNF for placement to improve strength, and gait hence reduce fall risk

## 2017-11-14 NOTE — Assessment & Plan Note (Signed)
Reports adverse reaction to higher dose of aricept, and discontinued same. management is without any medication at this ime

## 2017-11-14 NOTE — Assessment & Plan Note (Signed)
Caregiver, daughter reports burnout, needs Mother placed for strengthening and improvement in gait, will reach out to SNF , [facility] social worker to assist with this

## 2017-11-14 NOTE — Assessment & Plan Note (Signed)
Increased and uncontrolled back and gemneralized joint pain , including knees , s/p fall , toradol 60 mg IM  in office today

## 2017-11-14 NOTE — Assessment & Plan Note (Signed)
Stable with no flare

## 2017-11-15 ENCOUNTER — Other Ambulatory Visit: Payer: Self-pay | Admitting: Family Medicine

## 2017-11-15 DIAGNOSIS — R32 Unspecified urinary incontinence: Secondary | ICD-10-CM

## 2017-11-15 LAB — URINALYSIS
Bilirubin Urine: NEGATIVE
GLUCOSE, UA: NEGATIVE
Ketones, ur: NEGATIVE
NITRITE: POSITIVE — AB
Specific Gravity, Urine: 1.015 (ref 1.001–1.03)
pH: 7 (ref 5.0–8.0)

## 2017-11-15 LAB — URINE CULTURE
MICRO NUMBER:: 91318935
SPECIMEN QUALITY:: ADEQUATE

## 2017-11-16 ENCOUNTER — Other Ambulatory Visit: Payer: Self-pay | Admitting: Family Medicine

## 2017-11-16 ENCOUNTER — Telehealth: Payer: Self-pay | Admitting: Family Medicine

## 2017-11-16 MED ORDER — NITROFURANTOIN MACROCRYSTAL 100 MG PO CAPS: 100.0000 mg | ORAL_CAPSULE | Freq: Two times a day (BID) | ORAL | 0 refills | Status: DC

## 2017-11-16 NOTE — Telephone Encounter (Signed)
Advised that urine is resistant to cipro, needs to stop that and nitrofurantoin is prescribed, she understands

## 2017-11-22 ENCOUNTER — Other Ambulatory Visit: Payer: Self-pay | Admitting: Family Medicine

## 2017-11-28 ENCOUNTER — Other Ambulatory Visit: Payer: Self-pay | Admitting: Family Medicine

## 2017-11-30 ENCOUNTER — Ambulatory Visit: Payer: Medicare Other | Admitting: Family Medicine

## 2017-11-30 ENCOUNTER — Ambulatory Visit: Payer: Medicare Other

## 2017-12-13 ENCOUNTER — Other Ambulatory Visit: Payer: Self-pay | Admitting: Family Medicine

## 2017-12-13 DIAGNOSIS — R32 Unspecified urinary incontinence: Secondary | ICD-10-CM

## 2017-12-27 ENCOUNTER — Encounter: Payer: Self-pay | Admitting: *Deleted

## 2018-01-07 ENCOUNTER — Other Ambulatory Visit: Payer: Self-pay | Admitting: Family Medicine

## 2018-01-11 ENCOUNTER — Other Ambulatory Visit: Payer: Self-pay | Admitting: Family Medicine

## 2018-01-11 DIAGNOSIS — R32 Unspecified urinary incontinence: Secondary | ICD-10-CM

## 2018-01-23 ENCOUNTER — Encounter (HOSPITAL_COMMUNITY): Payer: Self-pay | Admitting: Emergency Medicine

## 2018-01-23 ENCOUNTER — Other Ambulatory Visit: Payer: Self-pay

## 2018-01-23 ENCOUNTER — Emergency Department (HOSPITAL_COMMUNITY): Payer: Medicare Other

## 2018-01-23 ENCOUNTER — Emergency Department (HOSPITAL_COMMUNITY)
Admission: EM | Admit: 2018-01-23 | Discharge: 2018-01-23 | Disposition: A | Discharge status: Home / Self Care | Payer: Medicare Other | Source: Home / Self Care | Attending: Emergency Medicine | Admitting: Emergency Medicine

## 2018-01-23 DIAGNOSIS — R296 Repeated falls: Secondary | ICD-10-CM | POA: Insufficient documentation

## 2018-01-23 DIAGNOSIS — Z87891 Personal history of nicotine dependence: Secondary | ICD-10-CM | POA: Insufficient documentation

## 2018-01-23 DIAGNOSIS — J449 Chronic obstructive pulmonary disease, unspecified: Secondary | ICD-10-CM

## 2018-01-23 DIAGNOSIS — G92 Toxic encephalopathy: Secondary | ICD-10-CM | POA: Diagnosis not present

## 2018-01-23 DIAGNOSIS — R509 Fever, unspecified: Secondary | ICD-10-CM | POA: Diagnosis not present

## 2018-01-23 DIAGNOSIS — S3992XA Unspecified injury of lower back, initial encounter: Secondary | ICD-10-CM | POA: Diagnosis not present

## 2018-01-23 DIAGNOSIS — J9811 Atelectasis: Secondary | ICD-10-CM | POA: Diagnosis not present

## 2018-01-23 DIAGNOSIS — R531 Weakness: Secondary | ICD-10-CM | POA: Diagnosis not present

## 2018-01-23 DIAGNOSIS — G8929 Other chronic pain: Secondary | ICD-10-CM

## 2018-01-23 DIAGNOSIS — M545 Low back pain: Secondary | ICD-10-CM

## 2018-01-23 DIAGNOSIS — J189 Pneumonia, unspecified organism: Secondary | ICD-10-CM | POA: Diagnosis not present

## 2018-01-23 DIAGNOSIS — Z79899 Other long term (current) drug therapy: Secondary | ICD-10-CM

## 2018-01-23 DIAGNOSIS — R4020 Unspecified coma: Secondary | ICD-10-CM | POA: Diagnosis not present

## 2018-01-23 DIAGNOSIS — Z515 Encounter for palliative care: Secondary | ICD-10-CM | POA: Diagnosis not present

## 2018-01-23 DIAGNOSIS — F0390 Unspecified dementia without behavioral disturbance: Secondary | ICD-10-CM | POA: Insufficient documentation

## 2018-01-23 DIAGNOSIS — I2510 Atherosclerotic heart disease of native coronary artery without angina pectoris: Secondary | ICD-10-CM | POA: Insufficient documentation

## 2018-01-23 DIAGNOSIS — R197 Diarrhea, unspecified: Secondary | ICD-10-CM | POA: Diagnosis not present

## 2018-01-23 DIAGNOSIS — R4182 Altered mental status, unspecified: Secondary | ICD-10-CM | POA: Diagnosis not present

## 2018-01-23 DIAGNOSIS — F0391 Unspecified dementia with behavioral disturbance: Secondary | ICD-10-CM | POA: Diagnosis not present

## 2018-01-23 DIAGNOSIS — I10 Essential (primary) hypertension: Secondary | ICD-10-CM | POA: Diagnosis not present

## 2018-01-23 DIAGNOSIS — J440 Chronic obstructive pulmonary disease with acute lower respiratory infection: Secondary | ICD-10-CM | POA: Diagnosis not present

## 2018-01-23 DIAGNOSIS — R410 Disorientation, unspecified: Secondary | ICD-10-CM | POA: Diagnosis not present

## 2018-01-23 LAB — CBC WITH DIFFERENTIAL/PLATELET
Abs Immature Granulocytes: 0.03 10*3/uL (ref 0.00–0.07)
Basophils Absolute: 0 10*3/uL (ref 0.0–0.1)
Basophils Relative: 0 %
Eosinophils Absolute: 0.2 10*3/uL (ref 0.0–0.5)
Eosinophils Relative: 3 %
HCT: 43.2 % (ref 36.0–46.0)
Hemoglobin: 12.7 g/dL (ref 12.0–15.0)
Immature Granulocytes: 0 %
LYMPHS ABS: 1.5 10*3/uL (ref 0.7–4.0)
Lymphocytes Relative: 18 %
MCH: 29.5 pg (ref 26.0–34.0)
MCHC: 29.4 g/dL — ABNORMAL LOW (ref 30.0–36.0)
MCV: 100.5 fL — AB (ref 80.0–100.0)
Monocytes Absolute: 0.6 10*3/uL (ref 0.1–1.0)
Monocytes Relative: 8 %
Neutro Abs: 5.8 10*3/uL (ref 1.7–7.7)
Neutrophils Relative %: 71 %
Platelets: 156 10*3/uL (ref 150–400)
RBC: 4.3 MIL/uL (ref 3.87–5.11)
RDW: 14.2 % (ref 11.5–15.5)
WBC: 8.2 10*3/uL (ref 4.0–10.5)
nRBC: 0 % (ref 0.0–0.2)

## 2018-01-23 LAB — COMPREHENSIVE METABOLIC PANEL
ALT: 12 U/L (ref 0–44)
AST: 16 U/L (ref 15–41)
Albumin: 3.4 g/dL — ABNORMAL LOW (ref 3.5–5.0)
Alkaline Phosphatase: 93 U/L (ref 38–126)
Anion gap: 7 (ref 5–15)
BUN: 16 mg/dL (ref 8–23)
CALCIUM: 8.4 mg/dL — AB (ref 8.9–10.3)
CO2: 28 mmol/L (ref 22–32)
Chloride: 109 mmol/L (ref 98–111)
Creatinine, Ser: 0.86 mg/dL (ref 0.44–1.00)
GFR calc Af Amer: >60 mL/min (ref >60)
GFR calc non Af Amer: 58 mL/min — ABNORMAL LOW (ref >60)
Glucose, Bld: 93 mg/dL (ref 70–99)
Potassium: 3.6 mmol/L (ref 3.5–5.1)
Sodium: 144 mmol/L (ref 135–145)
Total Bilirubin: 0.7 mg/dL (ref 0.3–1.2)
Total Protein: 6.7 g/dL (ref 6.5–8.1)

## 2018-01-23 LAB — URINALYSIS, ROUTINE W REFLEX MICROSCOPIC
Bilirubin Urine: NEGATIVE
Glucose, UA: NEGATIVE mg/dL
Ketones, ur: NEGATIVE mg/dL
Leukocytes, UA: NEGATIVE
Nitrite: NEGATIVE
Protein, ur: NEGATIVE mg/dL
Specific Gravity, Urine: 1.015 (ref 1.005–1.030)
pH: 5 (ref 5.0–8.0)

## 2018-01-23 LAB — LIPASE, BLOOD: Lipase: 17 U/L (ref 11–51)

## 2018-01-23 LAB — TROPONIN I: Troponin I: <0.03 ng/mL (ref <0.03)

## 2018-01-23 MED ORDER — SODIUM CHLORIDE 0.9 % IV BOLUS
1000.0000 mL | Freq: Once | INTRAVENOUS | Status: AC
  Administered 2018-01-23: 1000 mL via INTRAVENOUS

## 2018-01-23 NOTE — ED Provider Notes (Signed)
[facility] EMERGENCY DEPARTMENT Provider Note   CSN: [id number] Arrival date & time: 01/23/18  1253     History   Chief Complaint Chief Complaint  Patient presents with  . Fall    HPI Jennifer [name] is a 85 y.o. female.  She is presenting from home by ambulance for evaluation of back and knee pain.  Level 5 caveat secondary to dementia.  Patient says her back and knees always hurt but they hurt worse today.  She denies any recent falls.  Her daughter just arrived and filled in some details.  She says she has chronic back and knee pain from osteoarthritis.  She has had some recent falls and fell on Friday.  EMS had to help her back up into bed.  Saturday night she had 2 episodes of mucus diarrhea.  Daughter gave her 2 Imodium.  This morning while the patient was in the in the shower she experienced another fall.  Daughter was concerned that this increased weakness she might have an infection.  No known fever.  There is been a little bit of runny nose and nonproductive cough.  She had her flu shot and her pneumonia shot.  Gets frequent UTIs.  The history is provided by the patient and a relative.  Fall  This is a recurrent problem. The current episode started 1 to 2 hours ago. The problem has been resolved. Pertinent negatives include no chest pain, no abdominal pain, no headaches and no shortness of breath. Nothing aggravates the symptoms. Nothing relieves the symptoms. She has tried nothing for the symptoms. The treatment provided no relief.  Weakness  Severity:  Moderate Onset quality:  Gradual Timing:  Intermittent Progression:  Unchanged Chronicity:  Recurrent Relieved by:  Nothing Worsened by:  Nothing Ineffective treatments:  None tried Associated symptoms: arthralgias, cough, diarrhea and falls   Associated symptoms: no abdominal pain, no chest pain, no dysuria, no fever, no foul-smelling urine, no frequency, no headaches, no loss of consciousness, no nausea, no shortness of  breath, no syncope and no vomiting     Past Medical History:  Diagnosis Date  . Anemia    iron deficiency post op.  Marland Kitchen CAD (coronary artery disease) 2008   Cath 40% mid LAD, 35% RCA  . Cellulitis of finger    right   . Chest pain 2006  . Chronic back pain   . COPD (chronic obstructive pulmonary disease) (HCC)   . Gastroesophageal reflux disease   . Hiatal hernia   . Hyperlipidemia   . Nicotine addiction   . Osteoarthritis    status post left TKA; surgery on the right is anticipated in the near future   . Pulmonary nodules    stable since 2006  . Shingles    right breast   . Skin infection   . Ulcer    gential  . Use of cane as ambulatory aid     Patient Active Problem List   Diagnosis Date Noted  . Fall at home, initial encounter 11/14/2017  . Malodorous urine 08/30/2017  . Burnout of caregiver 07/21/2016  . DNR no code (do not resuscitate) 04/25/2014  . Urinary incontinence 04/04/2013  . Osteoporosis, post-menopausal 12/06/2012  . At high risk for falls 07/30/2012  . Dementia with behavioral disturbance (HCC) 08/19/2011  . COPD (chronic obstructive pulmonary disease) (HCC) 06/25/2011  . Allergic rhinitis 12/23/2010  . CEREBROVASCULAR DISEASE 08/05/2009  . Spinal stenosis 06/09/2007  . Coronary atherosclerosis 01/25/2007  . HIATAL HERNIA WITH REFLUX  01/25/2007  . Osteoarthritis 01/25/2007    Past Surgical History:  Procedure Laterality Date  . ABDOMINAL HYSTERECTOMY    . BREAST SURGERY N/A    bilateral  . CARDIAC CATHETERIZATION  2008   Mid LAD 40%, RCA 25%.   . CHOLECYSTECTOMY    . COMBINED HYSTERECTOMY ABDOMINAL W/ A&P REPAIR / OOPHORECTOMY  approx. 40 years ago   . EYE SURGERY    . MASTECTOMY  1998 & 2007    right -1998 / left 2007  . right knee replacement  09/2009   Dr. Romeo AppleHarrison  . TOTAL KNEE ARTHROPLASTY  4/08   left      OB History    Gravida      Para      Term      Preterm      AB      Living  1     SAB      TAB      Ectopic       Multiple      Live Births               Home Medications    Prior to Admission medications   Medication Sig Start Date End Date Taking? Authorizing Provider  acetaminophen (TYLENOL) 500 MG tablet Take 1,000 mg by mouth 2 (two) times daily. *May take an additional dose if needed for pain    [provider]  celecoxib (CELEBREX) 100 MG capsule TAKE ONE CAPSULE BY MOUTH EVERY DAY 09/20/17   Kerri PerchesSimpson, Margaret E, MD  cetirizine (ZYRTEC) 10 MG tablet TAKE 1 TABLET(10 MG) BY MOUTH DAILY 11/29/17   Kerri PerchesSimpson, Margaret E, MD  ciprofloxacin (CIPRO) 500 MG tablet Take 1 tablet (500 mg total) by mouth 2 (two) times daily. 11/13/17   Kerri PerchesSimpson, Margaret E, MD  gabapentin (NEURONTIN) 300 MG capsule TAKE 1 CAPSULE BY MOUTH EVERY MORNING AND TAKE 2 CAPSULES BY MOUTH EVERY NIGHT AT BEDTIME 01/10/18   Kerri PerchesSimpson, Margaret E, MD  nitrofurantoin (MACRODANTIN) 100 MG capsule Take 1 capsule (100 mg total) by mouth 2 (two) times daily. 11/16/17   Kerri PerchesSimpson, Margaret E, MD  oxybutynin (DITROPAN-XL) 5 MG 24 hr tablet TAKE 1 TABLET BY MOUTH EVERY NIGHT AT BEDTIME 01/11/18   Kerri PerchesSimpson, Margaret E, MD  UNABLE TO FIND Hoyer lift x 1 DX z91.81, g30.1, m48.00 10/12/17   Kerri PerchesSimpson, Margaret E, MD    Family History Family History  Problem Relation Age of Onset  . Cancer Mother 662       breast   . Cancer Father [id number]       prostate  . Hypertension Father   . Hypertension Sister   . Heart disease Sister   . Heart disease Sister   . Heart disease Sister     Social History Social History   Tobacco Use  . Smoking status: Former Smoker    Packs/day: 0.25    Years: 70.00    Pack years: 17.50    Types: Cigarettes    Last attempt to quit: 03/13/2015    Years since quitting: 2.8  . Smokeless tobacco: Never Used  Substance Use Topics  . Alcohol use: No    Alcohol/week: 0.0 standard drinks  . Drug use: No     Allergies   Aricept [donepezil hcl]; Iohexol; Iron; and Penicillins   Review of Systems Review of  Systems  Constitutional: Negative for fever.  HENT: Positive for sneezing. Negative for sore throat.   Eyes: Negative for visual disturbance.  Respiratory:  Positive for cough. Negative for shortness of breath.   Cardiovascular: Negative for chest pain and syncope.  Gastrointestinal: Positive for diarrhea. Negative for abdominal pain, nausea and vomiting.  Genitourinary: Negative for dysuria and frequency.  Musculoskeletal: Positive for arthralgias, back pain and falls. Negative for neck pain.  Skin: Negative for rash.  Neurological: Positive for weakness. Negative for loss of consciousness and headaches.     Physical Exam Updated Vital Signs BP (!) 156/77   Pulse 86   Temp 98.6 F (37 C) (Oral)   Resp 12   Ht 5\' 5"  (1.651 m)   Wt 86.2 kg   SpO2 94%   BMI 31.62 kg/m   Physical Exam Vitals signs and nursing note reviewed.  Constitutional:      General: She is not in acute distress.    Appearance: She is well-developed.  HENT:     Head: Normocephalic and atraumatic.  Eyes:     Conjunctiva/sclera: Conjunctivae normal.  Neck:     Musculoskeletal: Neck supple.  Cardiovascular:     Rate and Rhythm: Normal rate and regular rhythm.     Pulses: Normal pulses.     Heart sounds: No murmur.  Pulmonary:     Effort: Pulmonary effort is normal. No respiratory distress.     Breath sounds: Normal breath sounds.  Abdominal:     Palpations: Abdomen is soft.     Tenderness: There is no abdominal tenderness. There is no guarding or rebound.  Musculoskeletal: Normal range of motion.        General: No deformity.     Comments: She has diffuse lumbar pain.  She has surgical scars over both her knees.  Full range of motion.  Diffusely tender no obvious bruising swelling or wounds.  Skin:    General: Skin is warm and dry.     Capillary Refill: Capillary refill takes less than 2 seconds.  Neurological:     Mental Status: She is alert. Mental status is at baseline. She is disoriented.       ED Treatments / Results  Labs (all labs ordered are listed, but only abnormal results are displayed) Labs Reviewed  COMPREHENSIVE METABOLIC PANEL - Abnormal; Notable for the following components:      Result Value   Calcium 8.4 (*)    Albumin 3.4 (*)    GFR calc non Af Amer 58 (*)    All other components within normal limits  CBC WITH DIFFERENTIAL/PLATELET - Abnormal; Notable for the following components:   MCV 100.5 (*)    MCHC 29.4 (*)    All other components within normal limits  URINALYSIS, ROUTINE W REFLEX MICROSCOPIC - Abnormal; Notable for the following components:   APPearance CLOUDY (*)    Hgb urine dipstick SMALL (*)    Bacteria, UA RARE (*)    All other components within normal limits  URINE CULTURE  LIPASE, BLOOD  TROPONIN I    EKG EKG Interpretation  Date/Time:  Sunday January 23 2018 13:20:39 EST Ventricular Rate:  65 PR Interval:    QRS Duration: 94 QT Interval:  408 QTC Calculation: 425 R Axis:   29 Text Interpretation:  Sinus rhythm Abnormal R-wave progression, early transition Borderline repolarization abnormality similar pattern to prior 4/16 Confirmed by Meridee ScoreButler, Michael [phone]([id number]) on 01/23/2018 1:23:14 PM   Radiology Dg Chest 2 View  Result Date: 01/23/2018 CLINICAL DATA:  Initial evaluation for acute cough. EXAM: CHEST - 2 VIEW COMPARISON:  Prior radiograph from 03/23/2016. FINDINGS: Patient is markedly rotated  to the right. Allowing for rotation, cardiac and mediastinal silhouettes likely relatively unchanged. Heart size remains within normal limits. Lungs hypoinflated with elevation of the right hemidiaphragm. Diffuse bronchitic changes, compatible with COPD. Mild left basilar atelectasis. No consolidative opacity. No pulmonary edema or pleural effusion. No pneumothorax. No acute osseous abnormality. IMPRESSION: 1. COPD. No superimposed consolidative opacity to suggest pneumonia. 2. Mild left basilar atelectasis. Electronically Signed   By:  Rise Mu M.D.   On: 01/23/2018 14:43   Dg Lumbar Spine Complete  Result Date: 01/23/2018 CLINICAL DATA:  Initial evaluation for acute trauma, fall. EXAM: LUMBAR SPINE - COMPLETE 4+ VIEW COMPARISON:  Prior radiograph from 09/16/2017. FINDINGS: 5 non rib-bearing lumbar type vertebral bodies. Vertebral bodies normally aligned with preservation of the normal lumbar lordosis. Trace retrolisthesis of L1 on L2, stable. Vertebral body heights maintained without evidence for acute fracture. Minimal height loss at the superior endplate of T12 stable. Visualized sacrum and pelvis grossly intact. Moderate to advanced multilevel degenerative spondylolysis, most notable L3-4 and L4-5, similar. No acute soft tissue abnormality. Surgical clips noted within the right upper quadrant. Aortic atherosclerosis. IMPRESSION: 1. No radiographic evidence for acute abnormality within the lumbar spine. 2. Moderate to advanced multilevel degenerative spondylolysis, stable. Electronically Signed   By: Rise Mu M.D.   On: 01/23/2018 14:45    Procedures Procedures (including critical care time)  Medications Ordered in ED Medications  sodium chloride 0.9 % bolus 1,000 mL (0 mLs Intravenous Stopped 01/23/18 1627)     Initial Impression / Assessment and Plan / ED Course  I have reviewed the triage vital signs and the nursing notes.  Pertinent labs & imaging results that were available during my care of the patient were reviewed by me and considered in my medical decision making (see chart for details).  Clinical Course as of Jan 24 2132  Wynelle Link Jan 23, 2018  1635 Patient's lab work has been fairly unremarkable.  Her chest x-ray did not show any obvious pneumonia but her spine x-ray did show a lot of DJD no acute fractures.  She is received some IV fluids here.  Daughter said she has been falling a lot at home so we will put her in for case management face-to-face to see if she may be eligible to receive some  PT or other home care.   [MB]    Clinical Course User Index [MB] Terrilee Files, MD     Final Clinical Impressions(s) / ED Diagnoses   Final diagnoses:  Frequent falls  Chronic low back pain without sciatica, unspecified back pain laterality    ED Discharge Orders    None       Terrilee Files, MD 01/23/18 2135

## 2018-01-23 NOTE — ED Notes (Signed)
Pt removed IV from arm. Another RN at bedside to attempt placement.

## 2018-01-23 NOTE — ED Triage Notes (Signed)
Pt from home, c/o increase in generalized weakness x 2 weeks. States that when she stood her, her knees gave out and she fell to her bottom. Pt c/o back and knee pain. Pt states this pain is normal for her.

## 2018-01-23 NOTE — Discharge Instructions (Addendum)
You were evaluated in the emergency department for increasing falls over the past few weeks.  Your lab work and x-rays did not show an obvious cause of your symptoms.  It will be important for you to follow-up with the primary care doctor and let them know how things are going.  We put in a consult to case management to see if we can arrange some home services.  Please return if any worsening symptoms.

## 2018-01-23 NOTE — ED Notes (Signed)
EDP at bedside updating patient and family. 

## 2018-01-24 ENCOUNTER — Encounter (HOSPITAL_COMMUNITY): Payer: Self-pay

## 2018-01-24 ENCOUNTER — Emergency Department (HOSPITAL_COMMUNITY): Payer: Medicare Other

## 2018-01-24 ENCOUNTER — Observation Stay (HOSPITAL_COMMUNITY): Payer: Medicare Other

## 2018-01-24 ENCOUNTER — Inpatient Hospital Stay (HOSPITAL_COMMUNITY)
Admission: EM | Admit: 2018-01-24 | Discharge: 2018-01-28 | DRG: 193 | Disposition: A | Discharge status: Skilled Nursing Facility | Payer: Medicare Other | Attending: Internal Medicine | Admitting: Internal Medicine

## 2018-01-24 ENCOUNTER — Telehealth: Payer: Self-pay

## 2018-01-24 ENCOUNTER — Other Ambulatory Visit: Payer: Self-pay

## 2018-01-24 DIAGNOSIS — R509 Fever, unspecified: Secondary | ICD-10-CM

## 2018-01-24 DIAGNOSIS — J9811 Atelectasis: Secondary | ICD-10-CM | POA: Diagnosis present

## 2018-01-24 DIAGNOSIS — Z515 Encounter for palliative care: Secondary | ICD-10-CM

## 2018-01-24 DIAGNOSIS — Z79899 Other long term (current) drug therapy: Secondary | ICD-10-CM

## 2018-01-24 DIAGNOSIS — Z8249 Family history of ischemic heart disease and other diseases of the circulatory system: Secondary | ICD-10-CM

## 2018-01-24 DIAGNOSIS — J449 Chronic obstructive pulmonary disease, unspecified: Secondary | ICD-10-CM | POA: Diagnosis present

## 2018-01-24 DIAGNOSIS — R296 Repeated falls: Secondary | ICD-10-CM | POA: Diagnosis not present

## 2018-01-24 DIAGNOSIS — J189 Pneumonia, unspecified organism: Principal | ICD-10-CM | POA: Diagnosis present

## 2018-01-24 DIAGNOSIS — Z87440 Personal history of urinary (tract) infections: Secondary | ICD-10-CM

## 2018-01-24 DIAGNOSIS — R410 Disorientation, unspecified: Secondary | ICD-10-CM | POA: Diagnosis not present

## 2018-01-24 DIAGNOSIS — Z880 Allergy status to penicillin: Secondary | ICD-10-CM

## 2018-01-24 DIAGNOSIS — K219 Gastro-esophageal reflux disease without esophagitis: Secondary | ICD-10-CM | POA: Diagnosis present

## 2018-01-24 DIAGNOSIS — Z1611 Resistance to penicillins: Secondary | ICD-10-CM | POA: Diagnosis present

## 2018-01-24 DIAGNOSIS — F0390 Unspecified dementia without behavioral disturbance: Secondary | ICD-10-CM | POA: Diagnosis present

## 2018-01-24 DIAGNOSIS — Z7189 Other specified counseling: Secondary | ICD-10-CM

## 2018-01-24 DIAGNOSIS — K5730 Diverticulosis of large intestine without perforation or abscess without bleeding: Secondary | ICD-10-CM | POA: Diagnosis not present

## 2018-01-24 DIAGNOSIS — N390 Urinary tract infection, site not specified: Secondary | ICD-10-CM | POA: Diagnosis present

## 2018-01-24 DIAGNOSIS — R4182 Altered mental status, unspecified: Secondary | ICD-10-CM

## 2018-01-24 DIAGNOSIS — I10 Essential (primary) hypertension: Secondary | ICD-10-CM | POA: Diagnosis not present

## 2018-01-24 DIAGNOSIS — Z87891 Personal history of nicotine dependence: Secondary | ICD-10-CM

## 2018-01-24 DIAGNOSIS — G92 Toxic encephalopathy: Secondary | ICD-10-CM | POA: Diagnosis present

## 2018-01-24 DIAGNOSIS — R531 Weakness: Secondary | ICD-10-CM | POA: Diagnosis not present

## 2018-01-24 DIAGNOSIS — M4800 Spinal stenosis, site unspecified: Secondary | ICD-10-CM | POA: Diagnosis present

## 2018-01-24 DIAGNOSIS — H9190 Unspecified hearing loss, unspecified ear: Secondary | ICD-10-CM | POA: Diagnosis present

## 2018-01-24 DIAGNOSIS — B9689 Other specified bacterial agents as the cause of diseases classified elsewhere: Secondary | ICD-10-CM | POA: Diagnosis present

## 2018-01-24 DIAGNOSIS — R4020 Unspecified coma: Secondary | ICD-10-CM | POA: Diagnosis not present

## 2018-01-24 DIAGNOSIS — J440 Chronic obstructive pulmonary disease with acute lower respiratory infection: Secondary | ICD-10-CM | POA: Diagnosis present

## 2018-01-24 DIAGNOSIS — Z96651 Presence of right artificial knee joint: Secondary | ICD-10-CM | POA: Diagnosis present

## 2018-01-24 DIAGNOSIS — F0391 Unspecified dementia with behavioral disturbance: Secondary | ICD-10-CM | POA: Diagnosis present

## 2018-01-24 DIAGNOSIS — I2510 Atherosclerotic heart disease of native coronary artery without angina pectoris: Secondary | ICD-10-CM | POA: Diagnosis present

## 2018-01-24 DIAGNOSIS — E785 Hyperlipidemia, unspecified: Secondary | ICD-10-CM | POA: Diagnosis present

## 2018-01-24 DIAGNOSIS — Z66 Do not resuscitate: Secondary | ICD-10-CM | POA: Diagnosis present

## 2018-01-24 DIAGNOSIS — E876 Hypokalemia: Secondary | ICD-10-CM

## 2018-01-24 DIAGNOSIS — M1990 Unspecified osteoarthritis, unspecified site: Secondary | ICD-10-CM | POA: Diagnosis present

## 2018-01-24 DIAGNOSIS — Z888 Allergy status to other drugs, medicaments and biological substances status: Secondary | ICD-10-CM

## 2018-01-24 DIAGNOSIS — R404 Transient alteration of awareness: Secondary | ICD-10-CM | POA: Diagnosis not present

## 2018-01-24 LAB — CBC WITH DIFFERENTIAL/PLATELET
ABS IMMATURE GRANULOCYTES: 0.11 10*3/uL — AB (ref 0.00–0.07)
BASOS PCT: 0 %
Basophils Absolute: 0 10*3/uL (ref 0.0–0.1)
Eosinophils Absolute: 0 10*3/uL (ref 0.0–0.5)
Eosinophils Relative: 0 %
HCT: 41.5 % (ref 36.0–46.0)
Hemoglobin: 11.7 g/dL — ABNORMAL LOW (ref 12.0–15.0)
Immature Granulocytes: 1 %
Lymphocytes Relative: 7 %
Lymphs Abs: 1.1 10*3/uL (ref 0.7–4.0)
MCH: 29 pg (ref 26.0–34.0)
MCHC: 28.2 g/dL — ABNORMAL LOW (ref 30.0–36.0)
MCV: 103 fL — ABNORMAL HIGH (ref 80.0–100.0)
Monocytes Absolute: 0.9 10*3/uL (ref 0.1–1.0)
Monocytes Relative: 6 %
Neutro Abs: 12.5 10*3/uL — ABNORMAL HIGH (ref 1.7–7.7)
Neutrophils Relative %: 86 %
PLATELETS: 137 10*3/uL — AB (ref 150–400)
RBC: 4.03 MIL/uL (ref 3.87–5.11)
RDW: 14.2 % (ref 11.5–15.5)
WBC: 14.7 10*3/uL — AB (ref 4.0–10.5)
nRBC: 0 % (ref 0.0–0.2)

## 2018-01-24 LAB — LACTIC ACID, PLASMA
Lactic Acid, Venous: 1.4 mmol/L (ref 0.5–1.9)
Lactic Acid, Venous: 1.3 mmol/L (ref 0.5–1.9)

## 2018-01-24 LAB — COMPREHENSIVE METABOLIC PANEL
ALT: 13 U/L (ref 0–44)
AST: 20 U/L (ref 15–41)
Albumin: 3.1 g/dL — ABNORMAL LOW (ref 3.5–5.0)
Alkaline Phosphatase: 98 U/L (ref 38–126)
Anion gap: 9 (ref 5–15)
BUN: 13 mg/dL (ref 8–23)
CO2: 24 mmol/L (ref 22–32)
Calcium: 7.7 mg/dL — ABNORMAL LOW (ref 8.9–10.3)
Chloride: 105 mmol/L (ref 98–111)
Creatinine, Ser: 0.86 mg/dL (ref 0.44–1.00)
GFR calc Af Amer: >60 mL/min (ref >60)
GFR calc non Af Amer: 58 mL/min — ABNORMAL LOW (ref >60)
Glucose, Bld: 110 mg/dL — ABNORMAL HIGH (ref 70–99)
Potassium: 3.2 mmol/L — ABNORMAL LOW (ref 3.5–5.1)
Sodium: 138 mmol/L (ref 135–145)
Total Bilirubin: 0.9 mg/dL (ref 0.3–1.2)
Total Protein: 6.6 g/dL (ref 6.5–8.1)

## 2018-01-24 LAB — BRAIN NATRIURETIC PEPTIDE: B Natriuretic Peptide: 249 pg/mL — ABNORMAL HIGH (ref 0.0–100.0)

## 2018-01-24 LAB — URINALYSIS, ROUTINE W REFLEX MICROSCOPIC
Bilirubin Urine: NEGATIVE
Glucose, UA: NEGATIVE mg/dL
Ketones, ur: 20 mg/dL — AB
Leukocytes, UA: NEGATIVE
Nitrite: NEGATIVE
Protein, ur: 100 mg/dL — AB
Specific Gravity, Urine: 1.024 (ref 1.005–1.030)
pH: 5 (ref 5.0–8.0)

## 2018-01-24 LAB — TROPONIN I: TROPONIN I: 0.03 ng/mL — AB (ref <0.03)

## 2018-01-24 LAB — MAGNESIUM: Magnesium: 1.8 mg/dL (ref 1.7–2.4)

## 2018-01-24 LAB — INFLUENZA PANEL BY PCR (TYPE A & B)
Influenza A By PCR: NEGATIVE
Influenza B By PCR: NEGATIVE

## 2018-01-24 MED ORDER — POTASSIUM CHLORIDE IN NACL 40-0.9 MEQ/L-% IV SOLN
INTRAVENOUS | Status: AC
  Administered 2018-01-24: 100 mL/h via INTRAVENOUS
  Filled 2018-01-24 (×4): qty 1000

## 2018-01-24 MED ORDER — SODIUM CHLORIDE 0.9 % IV SOLN
1.0000 g | INTRAVENOUS | Status: DC
  Administered 2018-01-25 – 2018-01-26 (×3): 1 g via INTRAVENOUS
  Filled 2018-01-24: qty 1
  Filled 2018-01-24 (×3): qty 10

## 2018-01-24 MED ORDER — SODIUM CHLORIDE 0.9 % IV BOLUS
500.0000 mL | Freq: Once | INTRAVENOUS | Status: AC
  Administered 2018-01-24: 500 mL via INTRAVENOUS

## 2018-01-24 MED ORDER — ENOXAPARIN SODIUM 40 MG/0.4ML ~~LOC~~ SOLN
40.0000 mg | SUBCUTANEOUS | Status: DC
  Administered 2018-01-24 – 2018-01-25 (×2): 40 mg via SUBCUTANEOUS
  Filled 2018-01-24 (×2): qty 0.4

## 2018-01-24 MED ORDER — ALBUTEROL SULFATE (2.5 MG/3ML) 0.083% IN NEBU
2.5000 mg | INHALATION_SOLUTION | RESPIRATORY_TRACT | Status: DC | PRN
  Administered 2018-01-25: 2.5 mg via RESPIRATORY_TRACT
  Filled 2018-01-24: qty 3

## 2018-01-24 MED ORDER — ACETAMINOPHEN 325 MG PO TABS
650.0000 mg | ORAL_TABLET | Freq: Four times a day (QID) | ORAL | Status: DC | PRN
  Administered 2018-01-24 – 2018-01-27 (×3): 650 mg via ORAL
  Filled 2018-01-24 (×3): qty 2

## 2018-01-24 MED ORDER — ACETAMINOPHEN 650 MG RE SUPP
650.0000 mg | Freq: Four times a day (QID) | RECTAL | Status: DC | PRN
  Administered 2018-01-26: 650 mg via RECTAL
  Filled 2018-01-24: qty 1

## 2018-01-24 MED ORDER — GABAPENTIN 300 MG PO CAPS
600.0000 mg | ORAL_CAPSULE | Freq: Every day | ORAL | Status: DC
  Administered 2018-01-24 – 2018-01-25 (×2): 600 mg via ORAL
  Filled 2018-01-24 (×2): qty 2

## 2018-01-24 MED ORDER — POTASSIUM CHLORIDE 20 MEQ/15ML (10%) PO SOLN
40.0000 meq | Freq: Once | ORAL | Status: AC
  Administered 2018-01-24: 40 meq via ORAL
  Filled 2018-01-24: qty 30

## 2018-01-24 MED ORDER — GABAPENTIN 300 MG PO CAPS
300.0000 mg | ORAL_CAPSULE | Freq: Every day | ORAL | Status: DC
  Administered 2018-01-25: 300 mg via ORAL
  Filled 2018-01-24: qty 1

## 2018-01-24 MED ORDER — GABAPENTIN 300 MG PO CAPS: 300.0000 mg | ORAL_CAPSULE | ORAL | Status: DC

## 2018-01-24 MED ORDER — ACETAMINOPHEN 325 MG PO TABS
650.0000 mg | ORAL_TABLET | Freq: Once | ORAL | Status: AC
  Administered 2018-01-24: 650 mg via ORAL
  Filled 2018-01-24: qty 2

## 2018-01-24 MED ORDER — SODIUM CHLORIDE 0.9 % IV SOLN
500.0000 mg | INTRAVENOUS | Status: DC
  Administered 2018-01-25 – 2018-01-26 (×3): 500 mg via INTRAVENOUS
  Filled 2018-01-24 (×5): qty 500

## 2018-01-24 NOTE — H&P (Signed)
History and Physical    Jennifer [name] JOA:[id number] DOB: 1922-10-05 DOA: 01/24/2018  PCP: [name], MD  Patient coming from: Home  I have personally briefly reviewed patient's old medical records in [facility]  Chief Complaint: Altered mental status  HPI: Jennifer [name] is a 85 y.o. female with medical history significant for Dementia, COPD, HLD, CAD, osteoarthritis, spinal stenosis who presents to the ED with altered mental status.  Patient is unable to provide history due to AMS/dementia therefore entirety of history is obtained from daughter, EDP, and chart review.    Per daughter, patient has had 4 falls in the last 7 days.  She was seen in the ED yesterday without obvious acute issue on work-up and discharged home.  She had significant change in mental status this morning per daughter with inability to stand on her own (normally ambulates with use of walker) or communicate/recognize family members.  Normally she is able to recognize family and converse with simple questions.  Daughter states that patient had a large mucoid malodorous episode of diarrhea the evening of 01/22/2018.  Daughter has been treating with Imodium, therefore the patient has not had further frequent diarrhea.    Daughter states that patient has frequent UTIs and was last treated in early November, initially with ciprofloxacin which was switched to nitrofurantoin based on culture sensitivities.  She has not had antibiotics since then.  Daughter also states the patient has had recent poor appetite.  ED Course:  Initial vitals showed BP 161/62, pulse 74, RR 16, temp 101.9 Fahrenheit, SPO2 97% on room air.  Labs notable for WBC 14.7, hemoglobin 11.7, platelets 137, K 3.2, lactic acid 1.4, troponin I 0.03, BNP 249.0.  Urinalysis was negative for nitrites, l negative leukocytes, and with rare bacteria.  Influenza panel was negative.  2 view chest x-ray showed stable cardiomegaly without focal  consolidation, effusion, or infiltrates.  CT head without contrast showed chronic atrophic and ischemic changes without acute abnormality.  Patient was given 500 mL normal saline and the hospital service was consulted to admit for further evaluation and management.  Review of Systems: As per HPI otherwise 10 point review of systems negative.    Past Medical History:  Diagnosis Date  . Anemia    iron deficiency post op.  Marland Kitchen CAD (coronary artery disease) 2008   Cath 40% mid LAD, 35% RCA  . Cellulitis of finger    right   . Chest pain 2006  . Chronic back pain   . COPD (chronic obstructive pulmonary disease) (HCC)   . Gastroesophageal reflux disease   . Hiatal hernia   . Hyperlipidemia   . Nicotine addiction   . Osteoarthritis    status post left TKA; surgery on the right is anticipated in the near future   . Pulmonary nodules    stable since 2006  . Shingles    right breast   . Skin infection   . Ulcer    gential  . Use of cane as ambulatory aid     Past Surgical History:  Procedure Laterality Date  . ABDOMINAL HYSTERECTOMY    . BREAST SURGERY N/A    bilateral  . CARDIAC CATHETERIZATION  2008   Mid LAD 40%, RCA 25%.   . CHOLECYSTECTOMY    . COMBINED HYSTERECTOMY ABDOMINAL W/ A&P REPAIR / OOPHORECTOMY  approx. 40 years ago   . EYE SURGERY    . MASTECTOMY  1998 & 2007    right -1998 /  left 2007  . right knee replacement  09/2009   Dr. Romeo AppleHarrison  . TOTAL KNEE ARTHROPLASTY  4/08   left      reports that she quit smoking about 2 years ago. Her smoking use included cigarettes. She has a 17.50 pack-year smoking history. She has never used smokeless tobacco. She reports that she does not drink alcohol or use drugs.  Allergies  Allergen Reactions  . Aricept [Donepezil Hcl] Other (See Comments)    confusion  . Iohexol      Desc: PT ALLERGIC TO CONTRAST- SHE CAN'T BREATH   . Iron   . Penicillins     Has patient had a PCN reaction causing immediate rash,  facial/tongue/throat swelling, SOB or lightheadedness with hypotension:  unknown Has patient had a PCN reaction causing severe rash involving mucus membranes or skin necrosis: unknown Has patient had a PCN reaction that required hospitalization unknown Has patient had a PCN reaction occurring within the last 10 years: unknown If all of the above answers are "NO", then may proceed with Cephalosporin use.     Family History  Problem Relation Age of Onset  . Cancer Mother [id number]       breast   . Cancer Father [id number]       prostate  . Hypertension Father   . Hypertension Sister   . Heart disease Sister   . Heart disease Sister   . Heart disease Sister      Prior to Admission medications   Medication Sig Start Date End Date Taking? Authorizing Provider  acetaminophen (TYLENOL) 500 MG tablet Take 1,000 mg by mouth 2 (two) times daily. *May take an additional dose if needed for pain   Yes [provider]  celecoxib (CELEBREX) 100 MG capsule TAKE ONE CAPSULE BY MOUTH EVERY DAY 09/20/17  Yes Kerri PerchesSimpson, Margaret E, MD  cetirizine (ZYRTEC) 10 MG tablet TAKE 1 TABLET(10 MG) BY MOUTH DAILY 11/29/17  Yes Kerri PerchesSimpson, Margaret E, MD  gabapentin (NEURONTIN) 300 MG capsule TAKE 1 CAPSULE BY MOUTH EVERY MORNING AND TAKE 2 CAPSULES BY MOUTH EVERY NIGHT AT BEDTIME 01/10/18  Yes Kerri PerchesSimpson, Margaret E, MD  oxybutynin (DITROPAN-XL) 5 MG 24 hr tablet TAKE 1 TABLET BY MOUTH EVERY NIGHT AT BEDTIME 01/11/18  Yes Kerri PerchesSimpson, Margaret E, MD  ciprofloxacin (CIPRO) 500 MG tablet Take 1 tablet (500 mg total) by mouth 2 (two) times daily. 11/13/17   Kerri PerchesSimpson, Margaret E, MD  nitrofurantoin (MACRODANTIN) 100 MG capsule Take 1 capsule (100 mg total) by mouth 2 (two) times daily. Patient not taking: Reported on 01/24/2018 11/16/17   Kerri PerchesSimpson, Margaret E, MD    Physical Exam: Vitals:   01/24/18 1730 01/24/18 1800 01/24/18 1830 01/24/18 1900  BP: (!) 123/95 95/72 120/85 (!) 141/85  Pulse:  83 78 94  Resp:      Temp:      TempSrc:       SpO2:        Constitutional: Obese elderly woman resting in bed, NAD, calm, comfortable, hard of hearing Eyes: PERRL ENMT: Mucous membranes are moist. Posterior pharynx clear of any exudate or lesions. Neck: normal, supple, no masses. Respiratory: Bibasilar inspiratory crackles.. Normal respiratory effort. No accessory muscle use.  Cardiovascular: Regular rate and rhythm, systolic murmur present. No extremity edema. Abdomen: no tenderness, no masses palpated. No hepatosplenomegaly. Bowel sounds positive.  Musculoskeletal: no clubbing / cyanosis. No joint deformity upper and lower extremities.  Moving all extremities spontaneously.  Well-healed left knee surgical scar. Skin: no rashes, lesions,  ulcers. No induration Neurologic: CN 2-12 grossly intact. Sensation and strength intact. Psychiatric: Hard of hearing, alert but not participating much in conversation except for occasional yes/no answers   Labs on Admission: I have personally reviewed following labs and imaging studies  CBC: Recent Labs  Lab 01/23/18 1322 01/24/18 1444  WBC 8.2 14.7*  NEUTROABS 5.8 12.5*  HGB 12.7 11.7*  HCT 43.2 41.5  MCV 100.5* 103.0*  PLT 156 137*   Basic Metabolic Panel: Recent Labs  Lab 01/23/18 1322 01/24/18 1444  NA 144 138  K 3.6 3.2*  CL 109 105  CO2 28 24  GLUCOSE 93 110*  BUN 16 13  CREATININE 0.86 0.86  CALCIUM 8.4* 7.7*  MG  --  1.8   GFR: Estimated Creatinine Clearance: 44.3 mL/min (by C-G formula based on SCr of 0.86 mg/dL). Liver Function Tests: Recent Labs  Lab 01/23/18 1322 01/24/18 1444  AST 16 20  ALT 12 13  ALKPHOS 93 98  BILITOT 0.7 0.9  PROT 6.7 6.6  ALBUMIN 3.4* 3.1*   Recent Labs  Lab 01/23/18 1322  LIPASE 17   No results for input(s): AMMONIA in the last 168 hours. Coagulation Profile: No results for input(s): INR, PROTIME in the last 168 hours. Cardiac Enzymes: Recent Labs  Lab 01/23/18 1322 01/24/18 1444  TROPONINI <0.03 0.03*   BNP  (last 3 results) No results for input(s): PROBNP in the last 8760 hours. HbA1C: No results for input(s): HGBA1C in the last 72 hours. CBG: No results for input(s): GLUCAP in the last 168 hours. Lipid Profile: No results for input(s): CHOL, HDL, LDLCALC, TRIG, CHOLHDL, LDLDIRECT in the last 72 hours. Thyroid Function Tests: No results for input(s): TSH, T4TOTAL, FREET4, T3FREE, THYROIDAB in the last 72 hours. Anemia Panel: No results for input(s): VITAMINB12, FOLATE, FERRITIN, TIBC, IRON, RETICCTPCT in the last 72 hours. Urine analysis:    Component Value Date/Time   COLORURINE AMBER (A) 01/24/2018 1350   APPEARANCEUR CLOUDY (A) 01/24/2018 1350   LABSPEC 1.024 01/24/2018 1350   PHURINE 5.0 01/24/2018 1350   GLUCOSEU NEGATIVE 01/24/2018 1350   GLUCOSEU NEG mg/dL 16/10/960401/17/2008 [id number]   HGBUR MODERATE (A) 01/24/2018 1350   HGBUR negative 05/02/2008 1547   BILIRUBINUR NEGATIVE 01/24/2018 1350   BILIRUBINUR small 12/05/2012 1324   KETONESUR 20 (A) 01/24/2018 1350   PROTEINUR 100 (A) 01/24/2018 1350   UROBILINOGEN 1.0 12/05/2012 1324   UROBILINOGEN 0.2 05/02/2008 1547   NITRITE NEGATIVE 01/24/2018 1350   LEUKOCYTESUR NEGATIVE 01/24/2018 1350    Radiological Exams on Admission: Ct Abdomen Pelvis Wo Contrast  Result Date: 01/24/2018 CLINICAL DATA:  Fever, cough, confusion, abdominal pain EXAM: CT ABDOMEN AND PELVIS WITHOUT CONTRAST TECHNIQUE: Multidetector CT imaging of the abdomen and pelvis was performed following the standard protocol without IV contrast. COMPARISON:  10/20/2015 FINDINGS: Limited evaluation due to motion degradation, streak artifact from the patient's arms, and lack of intravenous contrast administration. Lower chest: Mild subpleural patchy opacity in the lateral right middle lobe and right lower lobe. Hepatobiliary: Unenhanced liver is unremarkable. Status post cholecystectomy. No intrahepatic or extrahepatic ductal dilatation. Pancreas: Fatty parenchymal atrophy. Spleen:  Within normal limits. Adrenals/Urinary Tract: Adrenal glands are within normal limits. Right upper pole renal cyst and left renal sinus cysts. No renal calculi or hydronephrosis. Bladder is mildly thick-walled although underdistended. Stomach/Bowel: Stomach is notable for a moderate hiatal hernia. No evidence of bowel obstruction. Appendix is not discretely visualized. Sigmoid diverticulosis, without evidence of diverticulitis. Vascular/Lymphatic: No evidence of abdominal aortic aneurysm.  Atherosclerotic calcifications of the abdominal aorta and branch vessels. No suspicious abdominopelvic lymphadenopathy. Reproductive: Status post hysterectomy. No adnexal masses. Other: No abdominopelvic ascites. Musculoskeletal: Degenerative changes of the visualized thoracolumbar spine. IMPRESSION: Limited evaluation, as described above. No evidence of bowel obstruction. Sigmoid diverticulosis, without evidence of diverticulitis. No CT findings to account for the patient's abdominal pain. Mild patchy subpleural opacity in the right middle and lower lobes, nonspecific and poorly evaluated, but suggesting mild infection/pneumonia. Electronically Signed   By: Charline Bills M.D.   On: 01/24/2018 20:23   Dg Chest 2 View  Result Date: 01/24/2018 CLINICAL DATA:  Altered level of consciousness. EXAM: CHEST - 2 VIEW COMPARISON:  Radiographs of January 23, 2018. FINDINGS: Stable cardiomegaly. No pneumothorax or pleural effusion is noted. No acute pulmonary disease is noted. Surgical clips are noted in both axillary regions. Bony thorax is unremarkable. IMPRESSION: No active cardiopulmonary disease. Electronically Signed   By: Lupita Raider, M.D.   On: 01/24/2018 15:27   Dg Chest 2 View  Result Date: 01/23/2018 CLINICAL DATA:  Initial evaluation for acute cough. EXAM: CHEST - 2 VIEW COMPARISON:  Prior radiograph from 03/23/2016. FINDINGS: Patient is markedly rotated to the right. Allowing for rotation, cardiac and mediastinal  silhouettes likely relatively unchanged. Heart size remains within normal limits. Lungs hypoinflated with elevation of the right hemidiaphragm. Diffuse bronchitic changes, compatible with COPD. Mild left basilar atelectasis. No consolidative opacity. No pulmonary edema or pleural effusion. No pneumothorax. No acute osseous abnormality. IMPRESSION: 1. COPD. No superimposed consolidative opacity to suggest pneumonia. 2. Mild left basilar atelectasis. Electronically Signed   By: Rise Mu M.D.   On: 01/23/2018 14:43   Dg Lumbar Spine Complete  Result Date: 01/23/2018 CLINICAL DATA:  Initial evaluation for acute trauma, fall. EXAM: LUMBAR SPINE - COMPLETE 4+ VIEW COMPARISON:  Prior radiograph from 09/16/2017. FINDINGS: 5 non rib-bearing lumbar type vertebral bodies. Vertebral bodies normally aligned with preservation of the normal lumbar lordosis. Trace retrolisthesis of L1 on L2, stable. Vertebral body heights maintained without evidence for acute fracture. Minimal height loss at the superior endplate of T12 stable. Visualized sacrum and pelvis grossly intact. Moderate to advanced multilevel degenerative spondylolysis, most notable L3-4 and L4-5, similar. No acute soft tissue abnormality. Surgical clips noted within the right upper quadrant. Aortic atherosclerosis. IMPRESSION: 1. No radiographic evidence for acute abnormality within the lumbar spine. 2. Moderate to advanced multilevel degenerative spondylolysis, stable. Electronically Signed   By: Rise Mu M.D.   On: 01/23/2018 14:45   Ct Head Wo Contrast  Result Date: 01/24/2018 CLINICAL DATA:  Recent falls with altered level of consciousness EXAM: CT HEAD WITHOUT CONTRAST TECHNIQUE: Contiguous axial images were obtained from the base of the skull through the vertex without intravenous contrast. COMPARISON:  12/07/2014 FINDINGS: Brain: Diffuse atrophic and chronic white matter ischemic changes are noted. No findings to suggest acute  hemorrhage, acute infarction or space-occupying mass lesion are noted. Vascular: No hyperdense vessel or unexpected calcification. Skull: Normal. Negative for fracture or focal lesion. Sinuses/Orbits: No acute finding. Other: None. IMPRESSION: Chronic atrophic and ischemic changes without acute abnormality. Electronically Signed   By: Alcide Clever M.D.   On: 01/24/2018 15:27    EKG: Independently reviewed. Normal sinus rhythm, rate 60 bpm, no acute ischemic changes.  Assessment/Plan Principal Problem:   Altered mental status Active Problems:   COPD (chronic obstructive pulmonary disease) (HCC)   Dementia with behavioral disturbance (HCC)   Frequent falls   Hypokalemia   Jennifer [name]  is a 85 y.o. female with medical history significant for Dementia, COPD, HLD, CAD, osteoarthritis, spinal stenosis who presents to the ED with altered mental status.  Altered mental status with fever and leukocytosis: No clear etiology on admission.  Report of large malodorous bowel movement 2 days prior to admission suggest possible gastroenteritis, would also consider C. difficile given antibiotic use for frequent UTIs (last antibiotics 2 months ago).  She is not had further bowel movements, this could be related to Imodium use versus resolution of gastroenteritis.  CT abdomen/pelvis is obtained and is negative for any obvious acute abdominal issue.  Nonspecific right middle and lower lobe patchy opacities is seen which may suggest possible mild infection/pneumonia. -Will start treatment with ceftriaxone/azithromycin for suspected pneumonia. -Continue IV fluids -Follow-up blood cultures -If having frequent diarrhea will check C. difficile panel  COPD: Chronic and stable without wheezing. -As needed albuterol nebulizers  Frequent falls: Likely combination of age, dementia, osteoarthritis of the knee, spinal stenosis, and acute illness. -Fall precautions -PT/OT eval  Hypokalemia: K 3.2  admission. -Repleting orally and with K added to IV fluids -Recheck in a.m.  Dementia with behavioral disturbance: Per daughter, mental status was slightly improving during my examination.  Patient is hard of hearing but following commands otherwise not really communicating at her baseline. -Delirium precautions   DVT prophylaxis: Lovenox Code Status: DNR, confirmed with daughter Family Communication: Daughter at bedside Disposition Plan: Pending clinical progress, PT/OT eval Consults called: None Admission status: Observation   Darreld Mclean MD [facility] Pager [phone]  If 7PM-7AM, please contact night-coverage [email or web address] Password [facility]  01/24/2018, 8:34 PM

## 2018-01-24 NOTE — Telephone Encounter (Signed)
Called back patient , fell twice this past weekend, poor oral intake , no more diarrhea , only ate , very confused, has cough, no fever documented, I explained that I will attempt to have her admitted, based on the history

## 2018-01-24 NOTE — ED Triage Notes (Signed)
EMS reports pt was seen here yesterday for altered LOC.  Reports worse today.  Pt unable to walk.  EMS says family says pt usually walks to and from the bathroom.  Reports feels warm to touch.  Family called Dr. Anthony Sar office and was told to return here.  Pt alert, answering yes and no.  Reports pt usually able to answer questions.  EMS reports pt has upper airway congestion.  CBG 124 with ems.

## 2018-01-24 NOTE — Telephone Encounter (Signed)
Pls call daughter, let her know I spoke with [facility], [facility], cANNOT be directly admitted, will need to be hospitalized for at least 3 days I also spoke with Dr Estell Harpin in the eD , explained the fact that report is of poor intake, increased confusion and repeated falls, she needs to take her Mother back to the ED today due to ongoing deterioration

## 2018-01-24 NOTE — ED Notes (Signed)
Pt is difficult stick. Unable to obtain blood. Lab aware

## 2018-01-24 NOTE — ED Provider Notes (Signed)
[facility] EMERGENCY DEPARTMENT Provider Note   CSN: [id number] Arrival date & time: 01/24/18  1246     History   Chief Complaint Chief Complaint  Patient presents with  . Altered Mental Status    HPI Jennifer [name] is a 85 y.o. female.  Level 5 caveat for altered mental status.  She was seen in the emergency department yesterday by me for frequent falls.  She had blood work chest x-ray urinalysis.  She was discharged back to home in the care of her daughter.  The daughter said she had some coughing last night but today found her mother to be altered and did not recognize family members.  They called the primary care doctor's office and were told to return to the emergency department.  Patient herself has no recollection of being here yesterday and is not making a lot of sense.  This is very different than yesterday.  There is not been any new trauma.  No new medications..  She is found to be febrile.  The history is provided by the patient.  Altered Mental Status  Presenting symptoms: confusion and disorientation   Severity:  Moderate Most recent episode:  Today Timing:  Constant Progression:  Unchanged Chronicity:  Recurrent Context: not drug use and not head injury     Past Medical History:  Diagnosis Date  . Anemia    iron deficiency post op.  Marland Kitchen. CAD (coronary artery disease) 2008   Cath 40% mid LAD, 35% RCA  . Cellulitis of finger    right   . Chest pain 2006  . Chronic back pain   . COPD (chronic obstructive pulmonary disease) (HCC)   . Gastroesophageal reflux disease   . Hiatal hernia   . Hyperlipidemia   . Nicotine addiction   . Osteoarthritis    status post left TKA; surgery on the right is anticipated in the near future   . Pulmonary nodules    stable since 2006  . Shingles    right breast   . Skin infection   . Ulcer    gential  . Use of cane as ambulatory aid     Patient Active Problem List   Diagnosis Date Noted  . Fall at home, initial  encounter 11/14/2017  . Malodorous urine 08/30/2017  . Burnout of caregiver 07/21/2016  . DNR no code (do not resuscitate) 04/25/2014  . Urinary incontinence 04/04/2013  . Osteoporosis, post-menopausal 12/06/2012  . At high risk for falls 07/30/2012  . Dementia with behavioral disturbance (HCC) 08/19/2011  . COPD (chronic obstructive pulmonary disease) (HCC) 06/25/2011  . Allergic rhinitis 12/23/2010  . CEREBROVASCULAR DISEASE 08/05/2009  . Spinal stenosis 06/09/2007  . Coronary atherosclerosis 01/25/2007  . HIATAL HERNIA WITH REFLUX 01/25/2007  . Osteoarthritis 01/25/2007    Past Surgical History:  Procedure Laterality Date  . ABDOMINAL HYSTERECTOMY    . BREAST SURGERY N/A    bilateral  . CARDIAC CATHETERIZATION  2008   Mid LAD 40%, RCA 25%.   . CHOLECYSTECTOMY    . COMBINED HYSTERECTOMY ABDOMINAL W/ A&P REPAIR / OOPHORECTOMY  approx. 40 years ago   . EYE SURGERY    . MASTECTOMY  1998 & 2007    right -1998 / left 2007  . right knee replacement  09/2009   Dr. Romeo AppleHarrison  . TOTAL KNEE ARTHROPLASTY  4/08   left      OB History    Gravida      Para      Term  Preterm      AB      Living  1     SAB      TAB      Ectopic      Multiple      Live Births               Home Medications    Prior to Admission medications   Medication Sig Start Date End Date Taking? Authorizing Provider  acetaminophen (TYLENOL) 500 MG tablet Take 1,000 mg by mouth 2 (two) times daily. *May take an additional dose if needed for pain    [provider]  celecoxib (CELEBREX) 100 MG capsule TAKE ONE CAPSULE BY MOUTH EVERY DAY 09/20/17   [name], MD  cetirizine (ZYRTEC) 10 MG tablet TAKE 1 TABLET(10 MG) BY MOUTH DAILY 11/29/17   [name], MD  ciprofloxacin (CIPRO) 500 MG tablet Take 1 tablet (500 mg total) by mouth 2 (two) times daily. 11/13/17   [name], MD  gabapentin (NEURONTIN) 300 MG capsule TAKE 1 CAPSULE BY MOUTH EVERY MORNING  AND TAKE 2 CAPSULES BY MOUTH EVERY NIGHT AT BEDTIME 01/10/18   [name], MD  nitrofurantoin (MACRODANTIN) 100 MG capsule Take 1 capsule (100 mg total) by mouth 2 (two) times daily. 11/16/17   [name], MD  oxybutynin (DITROPAN-XL) 5 MG 24 hr tablet TAKE 1 TABLET BY MOUTH EVERY NIGHT AT BEDTIME 01/11/18   [name], MD  UNABLE TO FIND Hoyer lift x 1 DX z91.81, g30.1, m48.00 10/12/17   [name], MD    Family History Family History  Problem Relation Age of Onset  . Cancer Mother 32       breast   . Cancer Father 17       prostate  . Hypertension Father   . Hypertension Sister   . Heart disease Sister   . Heart disease Sister   . Heart disease Sister     Social History Social History   Tobacco Use  . Smoking status: Former Smoker    Packs/day: 0.25    Years: 70.00    Pack years: 17.50    Types: Cigarettes    Last attempt to quit: 03/13/2015    Years since quitting: 2.8  . Smokeless tobacco: Never Used  Substance Use Topics  . Alcohol use: No    Alcohol/week: 0.0 standard drinks  . Drug use: No     Allergies   Aricept [donepezil hcl]; Iohexol; Iron; and Penicillins   Review of Systems Review of Systems  Unable to perform ROS: Mental status change  Psychiatric/Behavioral: Positive for confusion.     Physical Exam Updated Vital Signs BP (!) 161/62 (BP Location: Left Arm)   Pulse 74   Temp (!) 101.9 F (38.8 C) (Oral)   Resp 16   SpO2 97%   Physical Exam Vitals signs and nursing note reviewed.  Constitutional:      General: She is not in acute distress.    Appearance: She is well-developed.  HENT:     Head: Normocephalic and atraumatic.  Eyes:     Conjunctiva/sclera: Conjunctivae normal.  Neck:     Musculoskeletal: Neck supple.  Cardiovascular:     Rate and Rhythm: Normal rate and regular rhythm.     Heart sounds: No murmur.  Pulmonary:     Effort: Pulmonary effort is normal. No respiratory distress.     Breath  sounds: Normal breath sounds.  Abdominal:  Palpations: Abdomen is soft.     Tenderness: There is no abdominal tenderness.  Musculoskeletal:        General: No tenderness or signs of injury.  Skin:    General: Skin is warm and dry.     Capillary Refill: Capillary refill takes less than 2 seconds.  Neurological:     Mental Status: She is alert. She is disoriented.     Motor: No weakness.      ED Treatments / Results  Labs (all labs ordered are listed, but only abnormal results are displayed) Labs Reviewed  CBC WITH DIFFERENTIAL/PLATELET - Abnormal; Notable for the following components:      Result Value   WBC 14.7 (*)    Hemoglobin 11.7 (*)    MCV 103.0 (*)    MCHC 28.2 (*)    Platelets 137 (*)    Neutro Abs 12.5 (*)    Abs Immature Granulocytes 0.11 (*)    All other components within normal limits  COMPREHENSIVE METABOLIC PANEL - Abnormal; Notable for the following components:   Potassium 3.2 (*)    Glucose, Bld 110 (*)    Calcium 7.7 (*)    Albumin 3.1 (*)    GFR calc non Af Amer 58 (*)    All other components within normal limits  TROPONIN I - Abnormal; Notable for the following components:   Troponin I 0.03 (*)    All other components within normal limits  BRAIN NATRIURETIC PEPTIDE - Abnormal; Notable for the following components:   B Natriuretic Peptide 249.0 (*)    All other components within normal limits  URINALYSIS, ROUTINE W REFLEX MICROSCOPIC - Abnormal; Notable for the following components:   Color, Urine AMBER (*)    APPearance CLOUDY (*)    Hgb urine dipstick MODERATE (*)    Ketones, ur 20 (*)    Protein, ur 100 (*)    Bacteria, UA RARE (*)    All other components within normal limits  BASIC METABOLIC PANEL - Abnormal; Notable for the following components:   Glucose, Bld 127 (*)    Calcium 8.1 (*)    All other components within normal limits  CBC - Abnormal; Notable for the following components:   WBC 13.4 (*)    MCV 100.2 (*)    MCHC 29.8 (*)     Platelets 143 (*)    All other components within normal limits  CULTURE, BLOOD (ROUTINE X 2)  CULTURE, BLOOD (ROUTINE X 2)  URINE CULTURE  LACTIC ACID, PLASMA  LACTIC ACID, PLASMA  INFLUENZA PANEL BY PCR (TYPE A & B)  MAGNESIUM  BASIC METABOLIC PANEL  CBC    EKG None  Radiology Dg Chest 2 View  Result Date: 01/23/2018 CLINICAL DATA:  Initial evaluation for acute cough. EXAM: CHEST - 2 VIEW COMPARISON:  Prior radiograph from 03/23/2016. FINDINGS: Patient is markedly rotated to the right. Allowing for rotation, cardiac and mediastinal silhouettes likely relatively unchanged. Heart size remains within normal limits. Lungs hypoinflated with elevation of the right hemidiaphragm. Diffuse bronchitic changes, compatible with COPD. Mild left basilar atelectasis. No consolidative opacity. No pulmonary edema or pleural effusion. No pneumothorax. No acute osseous abnormality. IMPRESSION: 1. COPD. No superimposed consolidative opacity to suggest pneumonia. 2. Mild left basilar atelectasis. Electronically Signed   By: Rise Mu M.D.   On: 01/23/2018 14:43   Dg Lumbar Spine Complete  Result Date: 01/23/2018 CLINICAL DATA:  Initial evaluation for acute trauma, fall. EXAM: LUMBAR SPINE - COMPLETE 4+ VIEW COMPARISON:  Prior radiograph from 09/16/2017. FINDINGS: 5 non rib-bearing lumbar type vertebral bodies. Vertebral bodies normally aligned with preservation of the normal lumbar lordosis. Trace retrolisthesis of L1 on L2, stable. Vertebral body heights maintained without evidence for acute fracture. Minimal height loss at the superior endplate of T12 stable. Visualized sacrum and pelvis grossly intact. Moderate to advanced multilevel degenerative spondylolysis, most notable L3-4 and L4-5, similar. No acute soft tissue abnormality. Surgical clips noted within the right upper quadrant. Aortic atherosclerosis. IMPRESSION: 1. No radiographic evidence for acute abnormality within the lumbar spine. 2.  Moderate to advanced multilevel degenerative spondylolysis, stable. Electronically Signed   By: Rise MuBenjamin  McClintock M.D.   On: 01/23/2018 14:45    Procedures Procedures (including critical care time)  Medications Ordered in ED Medications  enoxaparin (LOVENOX) injection 40 mg (40 mg Subcutaneous Given 01/24/18 2218)  0.9 % NaCl with KCl 40 mEq / L  infusion (100 mL/hr Intravenous New Bag/Given 01/24/18 1857)  acetaminophen (TYLENOL) tablet 650 mg (650 mg Oral Given 01/25/18 1609)    Or  acetaminophen (TYLENOL) suppository 650 mg ( Rectal See Alternative 01/25/18 1609)  albuterol (PROVENTIL) (2.5 MG/3ML) 0.083% nebulizer solution 2.5 mg (2.5 mg Nebulization Given 01/25/18 0442)  gabapentin (NEURONTIN) capsule 300 mg (300 mg Oral Given 01/25/18 1041)    And  gabapentin (NEURONTIN) capsule 600 mg (600 mg Oral Given 01/24/18 2216)  cefTRIAXone (ROCEPHIN) 1 g in sodium chloride 0.9 % 100 mL IVPB (1 g Intravenous New Bag/Given 01/25/18 0031)  azithromycin (ZITHROMAX) 500 mg in sodium chloride 0.9 % 250 mL IVPB (500 mg Intravenous New Bag/Given 01/25/18 0256)  feeding supplement (BOOST / RESOURCE BREEZE) liquid 1 Container (1 Container Oral Given 01/25/18 1454)  QUEtiapine (SEROQUEL) tablet 25 mg (has no administration in time range)  trimethoprim-polymyxin b (POLYTRIM) ophthalmic solution 1 drop (has no administration in time range)  0.9 % NaCl with KCl 40 mEq / L  infusion (50 mL/hr Intravenous New Bag/Given 01/25/18 1827)  acetaminophen (TYLENOL) tablet 650 mg (650 mg Oral Given 01/24/18 1330)  sodium chloride 0.9 % bolus 500 mL (0 mLs Intravenous Stopped 01/24/18 1520)  potassium chloride 20 MEQ/15ML (10%) solution 40 mEq (40 mEq Oral Given 01/24/18 1856)  LORazepam (ATIVAN) injection 1 mg (1 mg Intravenous Given 01/25/18 0448)     Initial Impression / Assessment and Plan / ED Course  I have reviewed the triage vital signs and the nursing notes.  Pertinent labs & imaging results that were available  during my care of the patient were reviewed by me and considered in my medical decision making (see chart for details).  Clinical Course as of Jan 25 1834  Mon Jan 24, 2018  1607 Discussed with Dr. Allena KatzPatel from the hospitalist service who will evaluate the patient for admission.   [MB]    Clinical Course User Index [MB] Terrilee FilesButler, Audris Speaker C, MD    Final Clinical Impressions(s) / ED Diagnoses   Final diagnoses:  Altered mental status, unspecified altered mental status type  Fever, unspecified    ED Discharge Orders    None       Terrilee FilesButler, Jensyn Shave C, MD 01/25/18 [phone]

## 2018-01-24 NOTE — ED Notes (Signed)
CRITICAL VALUE ALERT  Critical Value:  Troponin 0.03  Date & Time Notied:  01/24/2017, 1558  Provider Notified: Dr. Charm Barges  Orders Received/Actions taken:see chart

## 2018-01-24 NOTE — Telephone Encounter (Signed)
Spoke with daughter [name], she is aware and agrees to have her Mother transported today to the ED at [facility]

## 2018-01-24 NOTE — Telephone Encounter (Signed)
Patient's daughter Jennifer [name] called and states that her mother has fallen four times in the past 10 days and that she has had episodes of uncontrolled diarrhea, was taken to the hospital ED by ambulance yesterday after the last fall and test were ran and they could find nothing and discharged her to home. This morning Jennifer [name] states that there is a marked decline in her Mother's cognitive ability, she reports not being able to get her Mother to respond to her, cant get her to walk, "she seems to not understand what I am saying to her". Daughter is at a loss of what to do at this point. Wonders about placement in a skilled nursing facility for rehab? Fears that if she takes her back to hospital, she won't get any more answers today than she did yesterday. Please advise

## 2018-01-24 NOTE — ED Notes (Signed)
Pt given dinner tray.

## 2018-01-24 NOTE — ED Notes (Signed)
ED TO INPATIENT HANDOFF REPORT  Name/Age/Gender Jennifer [name] 85 y.o. female  Code Status Code Status History    Date Active Date Inactive Code Status Order ID Comments User Context   03/23/2016 1506 03/27/2016 1928 DNR [id number]  Filbert SchilderKadolph, Alexandria U, MD Inpatient   04/22/2014 1615 04/23/2014 1552 Full Code [id number]  Henderson CloudHernandez Acosta, Estela Y, MD ED    Questions for Most Recent Historical Code Status (Order [id number])    Question Answer Comment   In the event of cardiac or respiratory ARREST Do not call a "code blue"    In the event of cardiac or respiratory ARREST Do not perform Intubation, CPR, defibrillation or ACLS    In the event of cardiac or respiratory ARREST Use medication by any route, position, wound care, and other measures to relive pain and suffering. May use oxygen, suction and manual treatment of airway obstruction as needed for comfort.         Advance Directive Documentation     Most Recent Value  Type of Advance Directive  Living will, Healthcare Power of Attorney  Pre-existing out of facility DNR order (yellow form or pink MOST form)  -  "MOST" Form in Place?  -      Home/SNF/Other Home  Chief Complaint lethargic  Level of Care/Admitting Diagnosis ED Disposition    ED Disposition Condition Comment   Admit  [facility] [[id number]]  Level of Care: Telemetry [5]  Diagnosis: Altered mental status [780.97.ICD-9-CM]  Admitting Physician: Charlsie QuestEL, VISHAL R [[id number]]  Attending Physician: Charlsie QuestPATEL, VISHAL R [[id number]]  PT Class (Do Not Modify): Observation [104]  PT Acc Code (Do Not Modify): Observation [10022]       Medical History Past Medical History:  Diagnosis Date  . Anemia    iron deficiency post op.  Marland Kitchen. CAD (coronary artery disease) 2008   Cath 40% mid LAD, 35% RCA  . Cellulitis of finger    right   . Chest pain 2006  . Chronic back pain   . COPD (chronic obstructive pulmonary disease) (HCC)   . Gastroesophageal reflux  disease   . Hiatal hernia   . Hyperlipidemia   . Nicotine addiction   . Osteoarthritis    status post left TKA; surgery on the right is anticipated in the near future   . Pulmonary nodules    stable since 2006  . Shingles    right breast   . Skin infection   . Ulcer    gential  . Use of cane as ambulatory aid     Allergies Allergies  Allergen Reactions  . Aricept [Donepezil Hcl] Other (See Comments)    confusion  . Iohexol      Desc: PT ALLERGIC TO CONTRAST- SHE CAN'T BREATH   . Iron   . Penicillins     Has patient had a PCN reaction causing immediate rash, facial/tongue/throat swelling, SOB or lightheadedness with hypotension:  unknown Has patient had a PCN reaction causing severe rash involving mucus membranes or skin necrosis: unknown Has patient had a PCN reaction that required hospitalization unknown Has patient had a PCN reaction occurring within the last 10 years: unknown If all of the above answers are "NO", then may proceed with Cephalosporin use.     IV Location/Drains/Wounds Patient Lines/Drains/Airways Status   Active Line/Drains/Airways    Name:   Placement date:   Placement time:   Site:   Days:   Peripheral IV 01/24/18 Left Hand   01/24/18  1351    Hand   less than 1          Labs/Imaging Results for orders placed or performed during the hospital encounter of 01/24/18 (from the past 48 hour(s))  Urinalysis, Routine w reflex microscopic     Status: Abnormal   Collection Time: 01/24/18  1:50 PM  Result Value Ref Range   Color, Urine AMBER (A) YELLOW    Comment: BIOCHEMICALS MAY BE AFFECTED BY COLOR   APPearance CLOUDY (A) CLEAR   Specific Gravity, Urine 1.024 1.005 - 1.030   pH 5.0 5.0 - 8.0   Glucose, UA NEGATIVE NEGATIVE mg/dL   Hgb urine dipstick MODERATE (A) NEGATIVE   Bilirubin Urine NEGATIVE NEGATIVE   Ketones, ur 20 (A) NEGATIVE mg/dL   Protein, ur 161 (A) NEGATIVE mg/dL   Nitrite NEGATIVE NEGATIVE   Leukocytes, UA NEGATIVE NEGATIVE    RBC / HPF 6-10 0 - 5 RBC/hpf   WBC, UA 0-5 0 - 5 WBC/hpf   Bacteria, UA RARE (A) NONE SEEN   Squamous Epithelial / LPF 11-20 0 - 5   Mucus PRESENT     Comment: Performed at [facility], [address], [place], [place] [address]  Culture, blood (routine x 2)     Status: None (Preliminary result)   Collection Time: 01/24/18  2:30 PM  Result Value Ref Range   Specimen Description RIGHT ANTECUBITAL    Special Requests      BOTTLES DRAWN AEROBIC AND ANAEROBIC Blood Culture adequate volume Performed at [facility], [address], [place], [place] [address]    Culture PENDING    Report Status PENDING   Brain natriuretic peptide     Status: Abnormal   Collection Time: 01/24/18  2:40 PM  Result Value Ref Range   B Natriuretic Peptide 249.0 (H) 0.0 - 100.0 pg/mL    Comment: Performed at [facility] [address], [place], [place] [address]  Culture, blood (routine x 2)     Status: None (Preliminary result)   Collection Time: 01/24/18  2:40 PM  Result Value Ref Range   Specimen Description BLOOD LEFT HAND    Special Requests      BOTTLES DRAWN AEROBIC ONLY Blood Culture results may not be optimal due to an inadequate volume of blood received in culture bottles Performed at [facility], [address], [place], [place] [address]    Culture PENDING    Report Status PENDING   CBC with Differential     Status: Abnormal   Collection Time: 01/24/18  2:44 PM  Result Value Ref Range   WBC 14.7 (H) 4.0 - 10.5 K/uL   RBC 4.03 3.87 - 5.11 MIL/uL   Hemoglobin 11.7 (L) 12.0 - 15.0 g/dL   HCT 62.1 30.8 - 65.7 %   MCV 103.0 (H) 80.0 - 100.0 fL   MCH 29.0 26.0 - 34.0 pg   MCHC 28.2 (L) 30.0 - 36.0 g/dL   RDW 84.6 96.2 - 95.2 %   Platelets 137 (L) 150 - 400 K/uL   nRBC 0.0 0.0 - 0.2 %   Neutrophils Relative % 86 %   Neutro Abs 12.5 (H) 1.7 - 7.7 K/uL   Lymphocytes Relative 7 %   Lymphs Abs 1.1 0.7 - 4.0 K/uL   Monocytes Relative 6 %   Monocytes Absolute 0.9 0.1 - 1.0 K/uL   Eosinophils  Relative 0 %   Eosinophils Absolute 0.0 0.0 - 0.5 K/uL   Basophils Relative 0 %   Basophils  Absolute 0.0 0.0 - 0.1 K/uL   Immature Granulocytes 1 %   Abs Immature Granulocytes 0.11 (H) 0.00 - 0.07 K/uL    Comment: Performed at [facility], [address] [place], [place] [address]  Comprehensive metabolic panel     Status: Abnormal   Collection Time: 01/24/18  2:44 PM  Result Value Ref Range   Sodium 138 135 - 145 mmol/L   Potassium 3.2 (L) 3.5 - 5.1 mmol/L   Chloride 105 98 - 111 mmol/L   CO2 24 22 - 32 mmol/L   Glucose, Bld 110 (H) 70 - 99 mg/dL   BUN 13 8 - 23 mg/dL   Creatinine, Ser 3.080.86 0.44 - 1.00 mg/dL   Calcium 7.7 (L) 8.9 - 10.3 mg/dL   Total Protein 6.6 6.5 - 8.1 g/dL   Albumin 3.1 (L) 3.5 - 5.0 g/dL   AST 20 15 - 41 U/L   ALT 13 0 - 44 U/L   Alkaline Phosphatase 98 38 - 126 U/L   Total Bilirubin 0.9 0.3 - 1.2 mg/dL   GFR calc non Af Amer 58 (L) >60 mL/min   GFR calc Af Amer >60 >60 mL/min   Anion gap 9 5 - 15    Comment: Performed at [facility], [address] [place], [place] [address]  Troponin I - Once     Status: Abnormal   Collection Time: 01/24/18  2:44 PM  Result Value Ref Range   Troponin I 0.03 (HH) <0.03 ng/mL    Comment: CRITICAL RESULT CALLED TO, READ BACK BY AND VERIFIED WITH: CARDWELL,L AT 1556 ON 1.13.20 BY ISLEY,B Performed at [facility], [address] [place], [place] [address]   Lactic acid, plasma     Status: None   Collection Time: 01/24/18  2:44 PM  Result Value Ref Range   Lactic Acid, Venous 1.4 0.5 - 1.9 mmol/L    Comment: Performed at [facility], [address] [place], [place] [address]  Lactic acid, plasma     Status: None   Collection Time: 01/24/18  3:44 PM  Result Value Ref Range   Lactic Acid, Venous 1.3 0.5 - 1.9 mmol/L    Comment: Performed at [facility], [address] [place], [place] [address]   Dg Chest 2 View  Result Date: 01/24/2018 CLINICAL DATA:  Altered level of consciousness. EXAM: CHEST - 2 VIEW  COMPARISON:  Radiographs of January 23, 2018. FINDINGS: Stable cardiomegaly. No pneumothorax or pleural effusion is noted. No acute pulmonary disease is noted. Surgical clips are noted in both axillary regions. Bony thorax is unremarkable. IMPRESSION: No active cardiopulmonary disease. Electronically Signed   By: Lupita RaiderJames  Green Jr, M.D.   On: 01/24/2018 15:27   Dg Chest 2 View  Result Date: 01/23/2018 CLINICAL DATA:  Initial evaluation for acute cough. EXAM: CHEST - 2 VIEW COMPARISON:  Prior radiograph from 03/23/2016. FINDINGS: Patient is markedly rotated to the right. Allowing for rotation, cardiac and mediastinal silhouettes likely relatively unchanged. Heart size remains within normal limits. Lungs hypoinflated with elevation of the right hemidiaphragm. Diffuse bronchitic changes, compatible with COPD. Mild left basilar atelectasis. No consolidative opacity. No pulmonary edema or pleural effusion. No pneumothorax. No acute osseous abnormality. IMPRESSION: 1. COPD. No superimposed consolidative opacity to suggest pneumonia. 2. Mild left basilar atelectasis. Electronically Signed   By: Rise MuBenjamin  McClintock M.D.   On: 01/23/2018 14:43   Dg Lumbar Spine Complete  Result Date: 01/23/2018 CLINICAL DATA:  Initial evaluation for acute trauma, fall. EXAM: LUMBAR  SPINE - COMPLETE 4+ VIEW COMPARISON:  Prior radiograph from 09/16/2017. FINDINGS: 5 non rib-bearing lumbar type vertebral bodies. Vertebral bodies normally aligned with preservation of the normal lumbar lordosis. Trace retrolisthesis of L1 on L2, stable. Vertebral body heights maintained without evidence for acute fracture. Minimal height loss at the superior endplate of T12 stable. Visualized sacrum and pelvis grossly intact. Moderate to advanced multilevel degenerative spondylolysis, most notable L3-4 and L4-5, similar. No acute soft tissue abnormality. Surgical clips noted within the right upper quadrant. Aortic atherosclerosis. IMPRESSION: 1. No  radiographic evidence for acute abnormality within the lumbar spine. 2. Moderate to advanced multilevel degenerative spondylolysis, stable. Electronically Signed   By: Rise Mu M.D.   On: 01/23/2018 14:45   Ct Head Wo Contrast  Result Date: 01/24/2018 CLINICAL DATA:  Recent falls with altered level of consciousness EXAM: CT HEAD WITHOUT CONTRAST TECHNIQUE: Contiguous axial images were obtained from the base of the skull through the vertex without intravenous contrast. COMPARISON:  12/07/2014 FINDINGS: Brain: Diffuse atrophic and chronic white matter ischemic changes are noted. No findings to suggest acute hemorrhage, acute infarction or space-occupying mass lesion are noted. Vascular: No hyperdense vessel or unexpected calcification. Skull: Normal. Negative for fracture or focal lesion. Sinuses/Orbits: No acute finding. Other: None. IMPRESSION: Chronic atrophic and ischemic changes without acute abnormality. Electronically Signed   By: Alcide Clever M.D.   On: 01/24/2018 15:27    Pending Labs Unresulted Labs (From admission, onward)    Start     Ordered   01/24/18 1509  Influenza panel by PCR (type A & B)  (Influenza PCR Panel)  Once,   R     01/24/18 1508   01/24/18 1350  Urine culture  ONCE - STAT,   STAT    Question:  Patient immune status  Answer:  Normal   01/24/18 1349          Vitals/Pain Today's Vitals   01/24/18 1259  BP: (!) 161/62  Pulse: 74  Resp: 16  Temp: (!) 101.9 F (38.8 C)  TempSrc: Oral  SpO2: 97%    Isolation Precautions Droplet precaution  Medications Medications  acetaminophen (TYLENOL) tablet 650 mg (650 mg Oral Given 01/24/18 1330)  sodium chloride 0.9 % bolus 500 mL (500 mLs Intravenous New Bag/Given 01/24/18 1418)    Mobility

## 2018-01-24 NOTE — ED Notes (Signed)
PT answering questions appropriately at this time.

## 2018-01-25 DIAGNOSIS — I2510 Atherosclerotic heart disease of native coronary artery without angina pectoris: Secondary | ICD-10-CM | POA: Diagnosis present

## 2018-01-25 DIAGNOSIS — R0902 Hypoxemia: Secondary | ICD-10-CM | POA: Diagnosis not present

## 2018-01-25 DIAGNOSIS — M4800 Spinal stenosis, site unspecified: Secondary | ICD-10-CM | POA: Diagnosis present

## 2018-01-25 DIAGNOSIS — F0391 Unspecified dementia with behavioral disturbance: Secondary | ICD-10-CM | POA: Diagnosis present

## 2018-01-25 DIAGNOSIS — Z880 Allergy status to penicillin: Secondary | ICD-10-CM | POA: Diagnosis not present

## 2018-01-25 DIAGNOSIS — Z7189 Other specified counseling: Secondary | ICD-10-CM | POA: Diagnosis not present

## 2018-01-25 DIAGNOSIS — G301 Alzheimer's disease with late onset: Secondary | ICD-10-CM

## 2018-01-25 DIAGNOSIS — J189 Pneumonia, unspecified organism: Secondary | ICD-10-CM | POA: Diagnosis present

## 2018-01-25 DIAGNOSIS — Z515 Encounter for palliative care: Secondary | ICD-10-CM | POA: Diagnosis present

## 2018-01-25 DIAGNOSIS — Z96651 Presence of right artificial knee joint: Secondary | ICD-10-CM | POA: Diagnosis present

## 2018-01-25 DIAGNOSIS — R4182 Altered mental status, unspecified: Secondary | ICD-10-CM | POA: Diagnosis not present

## 2018-01-25 DIAGNOSIS — N390 Urinary tract infection, site not specified: Secondary | ICD-10-CM | POA: Diagnosis present

## 2018-01-25 DIAGNOSIS — G92 Toxic encephalopathy: Secondary | ICD-10-CM | POA: Diagnosis present

## 2018-01-25 DIAGNOSIS — J449 Chronic obstructive pulmonary disease, unspecified: Secondary | ICD-10-CM | POA: Diagnosis not present

## 2018-01-25 DIAGNOSIS — B9689 Other specified bacterial agents as the cause of diseases classified elsewhere: Secondary | ICD-10-CM | POA: Diagnosis present

## 2018-01-25 DIAGNOSIS — Z87891 Personal history of nicotine dependence: Secondary | ICD-10-CM | POA: Diagnosis not present

## 2018-01-25 DIAGNOSIS — G9341 Metabolic encephalopathy: Secondary | ICD-10-CM | POA: Diagnosis not present

## 2018-01-25 DIAGNOSIS — B9620 Unspecified Escherichia coli [E. coli] as the cause of diseases classified elsewhere: Secondary | ICD-10-CM | POA: Diagnosis not present

## 2018-01-25 DIAGNOSIS — R410 Disorientation, unspecified: Secondary | ICD-10-CM | POA: Diagnosis not present

## 2018-01-25 DIAGNOSIS — F0281 Dementia in other diseases classified elsewhere with behavioral disturbance: Secondary | ICD-10-CM

## 2018-01-25 DIAGNOSIS — M6281 Muscle weakness (generalized): Secondary | ICD-10-CM | POA: Diagnosis not present

## 2018-01-25 DIAGNOSIS — H9190 Unspecified hearing loss, unspecified ear: Secondary | ICD-10-CM | POA: Diagnosis present

## 2018-01-25 DIAGNOSIS — K219 Gastro-esophageal reflux disease without esophagitis: Secondary | ICD-10-CM | POA: Diagnosis present

## 2018-01-25 DIAGNOSIS — Z66 Do not resuscitate: Secondary | ICD-10-CM | POA: Diagnosis present

## 2018-01-25 DIAGNOSIS — Z8249 Family history of ischemic heart disease and other diseases of the circulatory system: Secondary | ICD-10-CM | POA: Diagnosis not present

## 2018-01-25 DIAGNOSIS — R1312 Dysphagia, oropharyngeal phase: Secondary | ICD-10-CM | POA: Diagnosis not present

## 2018-01-25 DIAGNOSIS — R296 Repeated falls: Secondary | ICD-10-CM

## 2018-01-25 DIAGNOSIS — Z7401 Bed confinement status: Secondary | ICD-10-CM | POA: Diagnosis not present

## 2018-01-25 DIAGNOSIS — Z87440 Personal history of urinary (tract) infections: Secondary | ICD-10-CM | POA: Diagnosis not present

## 2018-01-25 DIAGNOSIS — Z9181 History of falling: Secondary | ICD-10-CM | POA: Diagnosis not present

## 2018-01-25 DIAGNOSIS — J440 Chronic obstructive pulmonary disease with acute lower respiratory infection: Secondary | ICD-10-CM | POA: Diagnosis present

## 2018-01-25 DIAGNOSIS — M1990 Unspecified osteoarthritis, unspecified site: Secondary | ICD-10-CM | POA: Diagnosis present

## 2018-01-25 DIAGNOSIS — Z888 Allergy status to other drugs, medicaments and biological substances status: Secondary | ICD-10-CM | POA: Diagnosis not present

## 2018-01-25 DIAGNOSIS — J9811 Atelectasis: Secondary | ICD-10-CM | POA: Diagnosis present

## 2018-01-25 DIAGNOSIS — R4020 Unspecified coma: Secondary | ICD-10-CM | POA: Diagnosis not present

## 2018-01-25 DIAGNOSIS — E785 Hyperlipidemia, unspecified: Secondary | ICD-10-CM | POA: Diagnosis present

## 2018-01-25 DIAGNOSIS — R262 Difficulty in walking, not elsewhere classified: Secondary | ICD-10-CM | POA: Diagnosis not present

## 2018-01-25 DIAGNOSIS — Z1611 Resistance to penicillins: Secondary | ICD-10-CM | POA: Diagnosis present

## 2018-01-25 DIAGNOSIS — E876 Hypokalemia: Secondary | ICD-10-CM

## 2018-01-25 DIAGNOSIS — R41841 Cognitive communication deficit: Secondary | ICD-10-CM | POA: Diagnosis not present

## 2018-01-25 LAB — BASIC METABOLIC PANEL
ANION GAP: 9 (ref 5–15)
BUN: 11 mg/dL (ref 8–23)
CO2: 24 mmol/L (ref 22–32)
Calcium: 8.1 mg/dL — ABNORMAL LOW (ref 8.9–10.3)
Chloride: 106 mmol/L (ref 98–111)
Creatinine, Ser: 0.71 mg/dL (ref 0.44–1.00)
GFR calc Af Amer: >60 mL/min (ref >60)
GFR calc non Af Amer: >60 mL/min (ref >60)
Glucose, Bld: 127 mg/dL — ABNORMAL HIGH (ref 70–99)
POTASSIUM: 3.9 mmol/L (ref 3.5–5.1)
Sodium: 139 mmol/L (ref 135–145)

## 2018-01-25 LAB — CBC
HCT: 42.9 % (ref 36.0–46.0)
HEMOGLOBIN: 12.8 g/dL (ref 12.0–15.0)
MCH: 29.9 pg (ref 26.0–34.0)
MCHC: 29.8 g/dL — ABNORMAL LOW (ref 30.0–36.0)
MCV: 100.2 fL — ABNORMAL HIGH (ref 80.0–100.0)
Platelets: 143 10*3/uL — ABNORMAL LOW (ref 150–400)
RBC: 4.28 MIL/uL (ref 3.87–5.11)
RDW: 14.1 % (ref 11.5–15.5)
WBC: 13.4 10*3/uL — ABNORMAL HIGH (ref 4.0–10.5)
nRBC: 0 % (ref 0.0–0.2)

## 2018-01-25 LAB — URINE CULTURE
Culture: 50000 — AB
Special Requests: NORMAL

## 2018-01-25 MED ORDER — BOOST / RESOURCE BREEZE PO LIQD CUSTOM
1.0000 | Freq: Two times a day (BID) | ORAL | Status: DC
  Administered 2018-01-25 – 2018-01-28 (×4): 1 via ORAL

## 2018-01-25 MED ORDER — QUETIAPINE FUMARATE 25 MG PO TABS
25.0000 mg | ORAL_TABLET | Freq: Every day | ORAL | Status: DC
  Administered 2018-01-25: 25 mg via ORAL
  Filled 2018-01-25: qty 1

## 2018-01-25 MED ORDER — POLYMYXIN B-TRIMETHOPRIM 10000-0.1 UNIT/ML-% OP SOLN
1.0000 [drp] | OPHTHALMIC | Status: DC
  Filled 2018-01-25: qty 10

## 2018-01-25 MED ORDER — POTASSIUM CHLORIDE IN NACL 40-0.9 MEQ/L-% IV SOLN
INTRAVENOUS | Status: DC
  Administered 2018-01-25 – 2018-01-26 (×2): 50 mL/h via INTRAVENOUS

## 2018-01-25 MED ORDER — LORAZEPAM 2 MG/ML IJ SOLN
1.0000 mg | Freq: Once | INTRAMUSCULAR | Status: AC
  Administered 2018-01-25: 1 mg via INTRAVENOUS
  Filled 2018-01-25: qty 1

## 2018-01-25 NOTE — [place] FL2 (Signed)
[place] MEDICA[place] FL2 LEVEL OF CARE SCREENING TOOL     IDENTIFICATION  Patient Name: Jennifer [name] Birthdate: 25-Jul-1922 Sex: female Admission Date (Current Location): 01/24/2018  [facility] [place]   Facility and Address:  [facility],  [address], [place] [address]      Provider Number: [id number]  Attending Physician Name and Address:  Vassie LollMadera, Carlos, MD  Relative Name and Phone Number:       Current Level of Care:   Recommended Level of Care:   Prior Approval Number:    Date Approved/Denied:   PASRR Number: [id number]  Discharge Plan: SNF    Current Diagnoses: Patient Active Problem List   Diagnosis Date Noted  . Altered mental status 01/24/2018  . Frequent falls 01/24/2018  . Hypokalemia 01/24/2018  . Fall at home, initial encounter 11/14/2017  . Malodorous urine 08/30/2017  . Burnout of caregiver 07/21/2016  . DNR no code (do not resuscitate) 04/25/2014  . Urinary incontinence 04/04/2013  . Osteoporosis, post-menopausal 12/06/2012  . At high risk for falls 07/30/2012  . Dementia with behavioral disturbance (HCC) 08/19/2011  . COPD (chronic obstructive pulmonary disease) (HCC) 06/25/2011  . Allergic rhinitis 12/23/2010  . CEREBROVASCULAR DISEASE 08/05/2009  . Spinal stenosis 06/09/2007  . Coronary atherosclerosis 01/25/2007  . HIATAL HERNIA WITH REFLUX 01/25/2007  . Osteoarthritis 01/25/2007    Orientation RESPIRATION BLADDER Height & Weight     Self  Normal Incontinent Weight: 202 lb 9.6 oz (91.9 kg) Height:  5\' 5"  (165.1 cm)  BEHAVIORAL SYMPTOMS/MOOD NEUROLOGICAL BOWEL NUTRITION STATUS      Incontinent Diet(regular )  AMBULATORY STATUS COMMUNICATION OF NEEDS Skin   Extensive Assist Verbally Normal                       Personal Care Assistance Level of Assistance  Bathing, Feeding, Dressing Bathing Assistance: Maximum assistance Feeding assistance: Independent Dressing Assistance: Maximum  assistance     Functional Limitations Info  Sight, Hearing, Speech Sight Info: Adequate Hearing Info: Impaired(HOH) Speech Info: Adequate    SPECIAL CARE FACTORS FREQUENCY  PT (By licensed PT)     PT Frequency: 5x/week              Contractures Contractures Info: Not present    Additional Factors Info  Code Status, Allergies, Isolation Precautions Code Status Info: DNR Allergies Info: Aricept, Iohexol, Iron, Penicillins     Isolation Precautions Info: droplet precautions      Current Medications (01/25/2018):  This is the current hospital active medication list Current Facility-Administered Medications  Medication Dose Route Frequency Provider Last Rate Last Dose  . acetaminophen (TYLENOL) tablet 650 mg  650 mg Oral Q6H PRN Charlsie QuestPatel, Vishal R, MD   650 mg at 01/24/18 2215   Or  . acetaminophen (TYLENOL) suppository 650 mg  650 mg Rectal Q6H PRN Darreld McleanPatel, Vishal R, MD      . albuterol (PROVENTIL) (2.5 MG/3ML) 0.083% nebulizer solution 2.5 mg  2.5 mg Nebulization Q2H PRN Darreld McleanPatel, Vishal R, MD   2.5 mg at 01/25/18 0442  . azithromycin (ZITHROMAX) 500 mg in sodium chloride 0.9 % 250 mL IVPB  500 mg Intravenous Q24H Darreld McleanPatel, Vishal R, MD 250 mL/hr at 01/25/18 0256 500 mg at 01/25/18 0256  . cefTRIAXone (ROCEPHIN) 1 g in sodium chloride 0.9 % 100 mL IVPB  1 g Intravenous Q24H Darreld McleanPatel, Vishal R, MD 200 mL/hr at 01/25/18 0031 1 g at 01/25/18 0031  . enoxaparin (LOVENOX)  injection 40 mg  40 mg Subcutaneous Q24H Charlsie Quest, MD   40 mg at 01/24/18 2218  . gabapentin (NEURONTIN) capsule 300 mg  300 mg Oral Daily Darreld Mclean R, MD   300 mg at 01/25/18 1041   And  . gabapentin (NEURONTIN) capsule 600 mg  600 mg Oral QHS Charlsie Quest, MD   600 mg at 01/24/18 2216     Discharge Medications: Please see discharge summary for a list of discharge medications.  Relevant Imaging Results:  Relevant Lab Results:   Additional Information SSN: [id number]  Jennifer Needy, LCSW

## 2018-01-25 NOTE — Clinical Social Work Note (Signed)
Clinical Social Work Assessment  Patient Details  Name: Jennifer [name] MRN: [id number] Date of Birth: [birth year]  Date of referral:  01/25/18               Reason for consult:  Discharge Planning                Permission sought to share information with:  Facility Industrial/product designer granted to share information::     Name::        Agency::  [facility]  Relationship::     Contact Information:     Housing/Transportation Living arrangements for the past 2 months:  Single Family Home Source of Information:  Adult Children Patient Interpreter Needed:  None Criminal Activity/Legal Involvement Pertinent to Current Situation/Hospitalization:    Significant Relationships:  Adult Children Lives with:  Adult Children Do you feel safe going back to the place where you live?  Yes Need for family participation in patient care:  Yes (Comment)  Care giving concerns: PT recommending SNF rehab.   Social Worker assessment / plan: Pt is a 85 year old female referred to CSW for SNF rehab placement. Attempted to meet with pt in her room this AM though she was very sound asleep and LCSW unable to awaken her. Spoke with pt's daughter by phone. Informed her of PT recommendation and also that Medicare SNF list left in pt's room. Daughter states she agrees pt needs SNF rehab and she requests referral to [facility]. Pt lives with daughter and grandson. Per daughter, pt was able to ambulate on Monday and she is fairly independent at home. Daughter would like pt to rehab and then return home. Pt is currently observation status and so would need the [facility] waiver unless she ends up having three inpatient midnights. DC timeframe not yet known.   Employment status:  Retired Health and safety inspector:  Medicare PT Recommendations:  Skilled Nursing Facility Information / Referral to community resources:  Skilled Nursing Facility  Patient/Family's Response to care: Pt and family accepting of care.  Patient/Family's  Understanding of and Emotional Response to Diagnosis, Current Treatment, and Prognosis: Pt unable to understand diagnosis and treatment recommendations at this time. Daughter appears to have a good understanding. No emotional distress identified at this time.  Emotional Assessment Appearance:  Appears stated age Attitude/Demeanor/Rapport:  Lethargic Affect (typically observed):  Unable to Assess Orientation:  Oriented to Self Alcohol / Substance use:  Not Applicable Psych involvement (Current and /or in the community):  No (Comment)  Discharge Needs  Concerns to be addressed:  Discharge Planning Concerns Readmission within the last 30 days:  No Current discharge risk:  Physical Impairment Barriers to Discharge:  Continued Medical Work up   [employer], LCSW 01/25/2018, 11:39 AM

## 2018-01-25 NOTE — Progress Notes (Signed)
Initial Nutrition Assessment  DOCUMENTATION CODES:  Obesity unspecified  INTERVENTION:  Boost Breeze po BID, each supplement provides 250 kcal and 9 grams of protein  Greatly appreciate nursings provision of feeding assistance  NUTRITION DIAGNOSIS:  Increased nutrient needs related to acute illness as evidenced by estimated nutrition requirements for this state  GOAL:  Patient will meet greater than or equal to 90% of their needs  MONITOR:  PO intake, Supplement acceptance, I & O's  REASON FOR ASSESSMENT:  Malnutrition Screening Tool    ASSESSMENT:  85 y/o female PMHx of dementia, COPD, HLD, CAD, osteoarthritis, spinal stenosis. Brought to ED d/t decline in mental status below her baseline. Has had 4 falls in <1 week. Workup shows no clear infectious etiology. Admitted for further evaluation.   Pt with severe dementia. All history obtained from the pts daughter at bedside.   She reports that the patient started deviating from her normal level of intake this past Saturday. She says since that time the patient has been eating <50% of what she normally does. For example, all the patient had yesterday to eat was a sandwich.   At baseline, the patient apparently eats well, atleast 3x a day. She is able to tolerate regular consistencies and textures. She is typically able to feed herself, but has not been able to during this acute illness. Pt does not take any vitamin or minerals. Supplement wise, the patient will receive an Ensure when the daughter feels the pt did not eat well at a meal, though she tries to avoid this as these often will then cause the pt to have an episode of diarrhea; she says the pt had her gallbladder taken out in past. Daughter gives patient imodium every few days as pt has some element of chronic diarrhea.  Weight wise, daughter reports the pts UBW is ~200 lbs. Todays bed weight is ~204 lbs. Per chart, the patient has been between 202-206 lbs since Spring 2019.    At this time, daughter says she was present at breakfast and the patient is still not eating as well as she usually does. As such, RD recommended an oral supplement. Recommended Boost Breeze given her reported intolerance to high fat supplements. Daughter was agreeable.   Labs: Glu:127, WBC:14.7-> 13.4 Meds: IV abx, IVF  Recent Labs  Lab 01/23/18 1322 01/24/18 1444 01/25/18 0540  NA 144 138 139  K 3.6 3.2* 3.9  CL 109 105 106  CO2 28 24 24   BUN 16 13 11   CREATININE 0.86 0.86 0.71  CALCIUM 8.4* 7.7* 8.1*  MG  --  1.8  --   GLUCOSE 93 110* 127*   NUTRITION - FOCUSED PHYSICAL EXAM:   Most Recent Value  Orbital Region  No depletion  Upper Arm Region  No depletion  Thoracic and Lumbar Region  No depletion  Buccal Region  No depletion  Temple Region  Mild depletion  Clavicle Bone Region  No depletion  Clavicle and Acromion Bone Region  No depletion  Scapular Bone Region  No depletion  Dorsal Hand  No depletion  Patellar Region  No depletion  Anterior Thigh Region  No depletion  Posterior Calf Region  No depletion  Edema (RD Assessment)  None  Hair  Reviewed  Eyes  Reviewed  Mouth  Reviewed  Skin  Reviewed  Nails  Reviewed     Diet Order:   Diet Order            Diet regular Room service appropriate? Yes;  Fluid consistency: Thin  Diet effective now             EDUCATION NEEDS:  No education needs have been identified at this time  Skin:  Skin Assessment: Reviewed RN Assessment  Last BM:  1/11  Height:  Ht Readings from Last 1 Encounters:  01/24/18 5\' 5"  (1.651 m)   Weight:  Wt Readings from Last 1 Encounters:  01/24/18 91.9 kg   Wt Readings from Last 10 Encounters:  01/24/18 91.9 kg  01/23/18 86.2 kg  11/08/17 93.5 kg  09/16/17 93.4 kg  08/30/17 93.5 kg  05/06/17 91.6 kg  04/28/17 88 kg  12/08/16 88 kg  07/16/16 96.6 kg  04/15/16 99.3 kg   Ideal Body Weight:  56.82 kg  BMI:  Body mass index is 33.71 kg/m.  Estimated Nutritional Needs:   Kcal:  1550-1750 (17-19 kcal/kg bw) Protein:  75-85 g Pro (1.3-1.5g/kg ibw) Fluid:  1.6-1.8 L fluid (+enough to replace stool losses)  Christophe Louis RD, LDN, CNSC Clinical Nutrition Available Tues-Sat via Pager: [phone] 01/25/2018 1:01 PM

## 2018-01-25 NOTE — Clinical Social Work Placement (Signed)
   CLINICAL SOCIAL WORK PLACEMENT  NOTE  Date:  01/25/2018  Patient Details  Name: Jennifer [name] MRN: [id number] Date of Birth: 26-Mar-1922  Clinical Social Work is seeking post-discharge placement for this patient at the Skilled  Nursing Facility level of care (*CSW will initial, date and re-position this form in  chart as items are completed):  Yes   Patient/family provided with [facility] Department's list of facilities offering this level of care within the geographic area requested by the patient (or if unable, by the patient's family).  Yes   Patient/family informed of their freedom to choose among providers that offer the needed level of care, that participate in Medicare, Medicaid or managed care program needed by the patient, have an available bed and are willing to accept the patient.  Yes   Patient/family informed of [facility]'s ownership interest in [facility] and [facility], as well as of the fact that they are under no obligation to receive care at these facilities.  PASRR submitted to EDS on       PASRR number received on       Existing PASRR number confirmed on 01/25/18     FL2 transmitted to all facilities in geographic area requested by pt/family on 01/25/18     FL2 transmitted to all facilities within larger geographic area on       Patient informed that his/her managed care company has contracts with or will negotiate with certain facilities, including the following:            Patient/family informed of bed offers received.  Patient chooses bed at       Physician recommends and patient chooses bed at      Patient to be transferred to   on  .  Patient to be transferred to facility by       Patient family notified on   of transfer.  Name of family member notified:        PHYSICIAN       Additional Comment:    _______________________________________________ Annice Needy, LCSW 01/25/2018, 12:56 PM

## 2018-01-25 NOTE — Evaluation (Signed)
Physical Therapy Evaluation Patient Details Name: Jennifer [name] MRN: [id number] DOB: [birth year] Today's Date: 01/25/2018   History of Present Illness  Jennifer [name] is a 85 y.o. female with medical history significant for Dementia, COPD, HLD, CAD, osteoarthritis, spinal stenosis who presents to the ED with altered mental status.  Patient is unable to provide history due to AMS/dementia therefore entirety of history is obtained from daughter, EDP, and chart review.  Per daughter, patient has had 4 falls in the last 7 days.  She was seen in the ED yesterday without obvious acute issue on work-up and discharged home.  She had significant change in mental status this morning per daughter with inability to stand on her own (normally ambulates with use of walker) or communicate/recognize family members.  Normally she is able to recognize family and converse with simple questions.  Daughter states that patient had a large mucoid malodorous episode of diarrhea the evening of 01/22/2018.  Daughter has been treating with Imodium, therefore the patient has not had further frequent diarrhea.       Clinical Impression  Patient alert, cooperative with therapy, but requires repeated verbal/tactile cueing possibly due to [facility], requires much assistance for sitting up at bedside, limited to partial standing using RW due to BLE weakness and required Max/total assist for repositioning when put back to bed.  Patient will benefit from continued physical therapy in hospital and recommended venue below to increase strength, balance, endurance for safe ADLs and gait.    Follow Up Recommendations SNF    Equipment Recommendations  None recommended by PT    Recommendations for Other Services       Precautions / Restrictions Precautions Precautions: Fall Restrictions Weight Bearing Restrictions: No      Mobility  Bed Mobility Overal bed mobility: Needs Assistance Bed Mobility: Supine to Sit;Sit to Supine      Supine to sit: Max assist Sit to supine: Max assist   General bed mobility comments: slow labored movement  Transfers Overall transfer level: Needs assistance Equipment used: Rolling walker (2 wheeled) Transfers: Sit to/from Stand Sit to Stand: Max assist         General transfer comment: able to lift bottom off bed, but unable to lock knees due to weakness  Ambulation/Gait                Stairs            Wheelchair Mobility    Modified Rankin (Stroke Patients Only)       Balance Overall balance assessment: Needs assistance Sitting-balance support: Feet supported;Bilateral upper extremity supported Sitting balance-Leahy Scale: Fair     Standing balance support: Bilateral upper extremity supported Standing balance-Leahy Scale: Poor Standing balance comment: using RW                             Pertinent Vitals/Pain Pain Assessment: Faces Faces Pain Scale: Hurts little more Pain Location: pressure to BLE Pain Descriptors / Indicators: Sore;Discomfort;Grimacing Pain Intervention(s): Limited activity within patient's tolerance;Monitored during session    Home Living Family/patient expects to be discharged to:: Private residence Living Arrangements: Children Available Help at Discharge: Family Type of Home: House       Home Layout: Multi-level;Able to live on main level with bedroom/bathroom Home Equipment: Dan Humphreys - 2 wheels;Wheelchair - manual Additional Comments: Patient is poor historian, info obtained previous admission notes    Prior Function Level of Independence: Needs assistance  Gait / Transfers Assistance Needed: household ambulator with RW  ADL's / Homemaking Assistance Needed: assisted by family        Hand Dominance   Dominant Hand: Right    Extremity/Trunk Assessment   Upper Extremity Assessment Upper Extremity Assessment: Defer to OT evaluation    Lower Extremity Assessment Lower Extremity Assessment:  Generalized weakness    Cervical / Trunk Assessment Cervical / Trunk Assessment: Kyphotic  Communication   Communication: HOH  Cognition Arousal/Alertness: Awake/alert Behavior During Therapy: WFL for tasks assessed/performed Overall Cognitive Status: History of cognitive impairments - at baseline                                 General Comments: patient requires Max verbal/tactile cueing possibly due to very [facility]      General Comments      Exercises     Assessment/Plan    PT Assessment Patient needs continued PT services  PT Problem List Decreased strength;Decreased activity tolerance;Decreased balance;Decreased mobility       PT Treatment Interventions Gait training;Stair training;Functional mobility training;Therapeutic activities;Patient/family education;Therapeutic exercise    PT Goals (Current goals can be found in the Care Plan section)  Acute Rehab PT Goals Patient Stated Goal: not stated PT Goal Formulation: With patient Time For Goal Achievement: 02/08/18 Potential to Achieve Goals: Fair    Frequency Min 3X/week   Barriers to discharge        Co-evaluation               AM-PAC PT "6 Clicks" Mobility  Outcome Measure Help needed turning from your back to your side while in a flat bed without using bedrails?: Total Help needed moving from lying on your back to sitting on the side of a flat bed without using bedrails?: Total Help needed moving to and from a bed to a chair (including a wheelchair)?: Total Help needed standing up from a chair using your arms (e.g., wheelchair or bedside chair)?: A Lot Help needed to walk in hospital room?: Total Help needed climbing 3-5 steps with a railing? : Total 6 Click Score: 7    End of Session Equipment Utilized During Treatment: Gait belt Activity Tolerance: Patient tolerated treatment well;Patient limited by fatigue Patient left: in bed;with call bell/phone within reach Nurse Communication:  Mobility status PT Visit Diagnosis: Unsteadiness on feet (R26.81);Other abnormalities of gait and mobility (R26.89);Muscle weakness (generalized) (M62.81)    Time: 9024-0973 PT Time Calculation (min) (ACUTE ONLY): 29 min   Charges:   PT Evaluation $PT Eval Moderate Complexity: 1 Mod PT Treatments $Therapeutic Activity: 23-37 mins        11:36 AM, 01/25/18 Ocie Bob, MPT Physical Therapist with [facility] [phone] office [phone] mobile phone

## 2018-01-25 NOTE — Progress Notes (Signed)
OT Cancellation Note  Patient Details Name: Jennifer [name] MRN: [id number] DOB: [birth year]   Cancelled Treatment:    Reason Eval/Treat Not Completed: Patient's level of consciousness. Attempted to see pt this am, pt sleeping and mumbling in her sleep constantly. Unable to be awakened or actively participate. Spoke with PT who reports also attempted and pt had medication early this am affecting level of consciousness.  Will attempt at a later time.   Ezra Sites, OTR/L  [phone] 01/25/2018, 8:41 AM

## 2018-01-25 NOTE — Plan of Care (Signed)
  Problem: Acute Rehab PT Goals(only PT should resolve) Goal: Pt Will Go Supine/Side To Sit Outcome: Progressing Flowsheets (Taken 01/25/2018 1137) Pt will go Supine/Side to Sit: with moderate assist Goal: Patient Will Transfer Sit To/From Stand Outcome: Progressing Flowsheets (Taken 01/25/2018 1137) Patient will transfer sit to/from stand: with moderate assist Goal: Pt Will Transfer Bed To Chair/Chair To Bed Outcome: Progressing Flowsheets (Taken 01/25/2018 1137) Pt will Transfer Bed to Chair/Chair to Bed: with max assist; with mod assist Goal: Pt Will Ambulate Outcome: Progressing Flowsheets (Taken 01/25/2018 1137) Pt will Ambulate: 10 feet; with moderate assist; with maximum assist; with rolling walker   11:38 AM, 01/25/18 Ocie Bob, MPT Physical Therapist with [facility] [phone] office [phone] mobile phone

## 2018-01-25 NOTE — Progress Notes (Signed)
PROGRESS NOTE    Jennifer [name]  HGD:[id number] DOB: 21-Dec-1922 DOA: 01/24/2018 PCP: [name], MD     Brief Narrative:  85 y.o. female with medical history significant for Dementia, COPD, HLD, CAD, osteoarthritis, spinal stenosis who presents to the ED with altered mental status.  Patient is unable to provide history due to AMS/dementia therefore entirety of history is obtained from daughter, EDP, and chart review.   Per daughter, patient has had 4 falls in the last 7 days.  She was seen in the ED yesterday without obvious acute issue on work-up and discharged home.  She had significant change in mental status this morning per daughter with inability to stand on her own (normally ambulates with use of walker) or communicate/recognize family members.  Normally she is able to recognize family and converse with simple questions.  Daughter states that patient had a large mucoid malodorous episode of diarrhea the evening of 01/22/2018.  Daughter has been treating with Imodium, therefore the patient has not had further frequent diarrhea.    Daughter states that patient has frequent UTIs and was last treated in early November, initially with ciprofloxacin which was switched to nitrofurantoin based on culture sensitivities.  She has not had antibiotics since then. Daughter also states the patient has had recent poor appetite.   Assessment & Plan: 1-acute toxic encephalopathy: In the setting of E. coli UTI and pneumonia.  -Patient with underlying history of dementia and presenting acute hospital-acquired delirium. -Minimize the use of benzodiazepines or sedative agents -Continue IV antibiotics -Provide supportive care -Patient will be started on low-dose Seroquel nightly -Follow clinical response.   -Continue as needed nebulizer treatment -She is afebrile and at this moment with good O2 sat on room air. -WBC is elevated.  2-E. coli UTI -Cultures demonstrating microorganism to be  resistant to ampicillin -Continue therapy with ceftriaxone as currently in place.  3-COPD (chronic obstructive pulmonary disease) (HCC) -Currently stable and compensated -Patient is no wheezing -Continue as needed nebulizer treatment.  4-Dementia with behavioral disturbance (HCC) and high risk for delirium -Continue supportive care -Started on Seroquel -Hopefully will be able to be stabilize soon for expedite discharge  5-Frequent falls and physical deconditioning -Physical therapy has evaluated patient and recommended skilled nursing facility for rehab and conditioning. -Daughter in agreement. -Social worker is aware and assisting with placement.  6-Hypokalemia -Repleted and within normal limits -Follow electrolytes trend intermittently.  DVT prophylaxis: Lovenox Code Status: DNR Family Communication: Daughter at bedside. Disposition Plan: Evaluated by physical therapy and found awfully weak and deconditioned.  Patient known ER her usual physical status at baseline and currently experiencing acute encephalopathy from active infection and hospital-acquired delirium.  Recommendations given for skilled nursing facility at discharge.  Consultants:   None  Procedures:   See below for x-ray report.  Antimicrobials:  Anti-infectives (From admission, onward)   Start     Dose/Rate Route Frequency Ordered Stop   01/24/18 2200  azithromycin (ZITHROMAX) 500 mg in sodium chloride 0.9 % 250 mL IVPB     500 mg 250 mL/hr over 60 Minutes Intravenous Every 24 hours 01/24/18 2030     01/24/18 2100  cefTRIAXone (ROCEPHIN) 1 g in sodium chloride 0.9 % 100 mL IVPB     1 g 200 mL/hr over 30 Minutes Intravenous Every 24 hours 01/24/18 2030        Subjective: Pleasantly confused, afebrile, able to follow simple commands.  Requiring assistant with feeding.  No nausea, no vomiting.  Objective: Vitals:  01/24/18 2315 01/25/18 0442 01/25/18 0638 01/25/18 1601  BP:   (!) 150/79 (!) 163/99    Pulse:   100 97  Resp:   18 20  Temp: 99.7 F (37.6 C)  99.6 F (37.6 C) (!) 101.5 F (38.6 C)  TempSrc: Axillary  Oral Oral  SpO2:  99%  96%  Weight: 91.9 kg     Height: 5\' 5"  (1.651 m)       Intake/Output Summary (Last 24 hours) at 01/25/2018 1747 Last data filed at 01/25/2018 1500 Gross per 24 hour  Intake 995.57 ml  Output 500 ml  Net 495.57 ml   Filed Weights   01/24/18 2129 01/24/18 2315  Weight: 91.9 kg 91.9 kg    Examination: General exam: Alert, disoriented, able to follow simple commands.  According to daughter at bedside patient mentation not at baseline yet.  She is afebrile and is currently denying chest pain or shortness of breath. Patient with pink eye and thick white discharge (right > Left). Respiratory system: No wheezing, positive scattered rhonchi. Cardiovascular system:RRR. No murmurs, rubs, gallops. Gastrointestinal system: Abdomen is nondistended, soft and nontender. No organomegaly or masses felt. Normal bowel sounds heard. Central nervous system: Alert and oriented. No focal neurological deficits. Extremities: No cyanosis or clubbing.  Trace edema bilaterally appreciated exam. Skin: No rashes, no petechiae, no open wounds. Psychiatry: Judgement and insight appear impair secondary to underlying dementia at baseline and acute encephalopathy with most likely hospital-acquired delirium.    Data Reviewed: I have personally reviewed following labs and imaging studies  CBC: Recent Labs  Lab 01/23/18 1322 01/24/18 1444 01/25/18 0540  WBC 8.2 14.7* 13.4*  NEUTROABS 5.8 12.5*  --   HGB 12.7 11.7* 12.8  HCT 43.2 41.5 42.9  MCV 100.5* 103.0* 100.2*  PLT 156 137* 143*   Basic Metabolic Panel: Recent Labs  Lab 01/23/18 1322 01/24/18 1444 01/25/18 0540  NA 144 138 139  K 3.6 3.2* 3.9  CL 109 105 106  CO2 28 24 24   GLUCOSE 93 110* 127*  BUN 16 13 11   CREATININE 0.86 0.86 0.71  CALCIUM 8.4* 7.7* 8.1*  MG  --  1.8  --    GFR: Estimated  Creatinine Clearance: 49.2 mL/min (by C-G formula based on SCr of 0.71 mg/dL).   Liver Function Tests: Recent Labs  Lab 01/23/18 1322 01/24/18 1444  AST 16 20  ALT 12 13  ALKPHOS 93 98  BILITOT 0.7 0.9  PROT 6.7 6.6  ALBUMIN 3.4* 3.1*   Recent Labs  Lab 01/23/18 1322  LIPASE 17   Cardiac Enzymes: Recent Labs  Lab 01/23/18 1322 01/24/18 1444  TROPONINI <0.03 0.03*   Urine analysis:    Component Value Date/Time   COLORURINE AMBER (A) 01/24/2018 1350   APPEARANCEUR CLOUDY (A) 01/24/2018 1350   LABSPEC 1.024 01/24/2018 1350   PHURINE 5.0 01/24/2018 1350   GLUCOSEU NEGATIVE 01/24/2018 1350   GLUCOSEU NEG mg/dL 13/08/657801/17/2008 [id number]   HGBUR MODERATE (A) 01/24/2018 1350   HGBUR negative 05/02/2008 1547   BILIRUBINUR NEGATIVE 01/24/2018 1350   BILIRUBINUR small 12/05/2012 1324   KETONESUR 20 (A) 01/24/2018 1350   PROTEINUR 100 (A) 01/24/2018 1350   UROBILINOGEN 1.0 12/05/2012 1324   UROBILINOGEN 0.2 05/02/2008 1547   NITRITE NEGATIVE 01/24/2018 1350   LEUKOCYTESUR NEGATIVE 01/24/2018 1350    Recent Results (from the past 240 hour(s))  Urine culture     Status: Abnormal   Collection Time: 01/23/18  3:51 PM  Result Value Ref Range  Status   Specimen Description   Final    URINE, CATHETERIZED Performed at [facility], [address], [place], [place] [address]    Special Requests   Final    Normal Performed at [facility], [address] [place], [place] [address]    Culture 50,000 COLONIES/mL ESCHERICHIA COLI (A)  Final   Report Status 01/25/2018 FINAL  Final   Organism ID, Bacteria ESCHERICHIA COLI (A)  Final      Susceptibility   Escherichia coli - MIC*    AMPICILLIN >=32 RESISTANT Resistant     CEFAZOLIN <=4 SENSITIVE Sensitive     CEFTRIAXONE <=1 SENSITIVE Sensitive     CIPROFLOXACIN >=4 RESISTANT Resistant     GENTAMICIN <=1 SENSITIVE Sensitive     IMIPENEM <=0.25 SENSITIVE Sensitive     NITROFURANTOIN <=16 SENSITIVE Sensitive     TRIMETH/SULFA <=20  SENSITIVE Sensitive     AMPICILLIN/SULBACTAM >=32 RESISTANT Resistant     PIP/TAZO <=4 SENSITIVE Sensitive     Extended ESBL NEGATIVE Sensitive     * 50,000 COLONIES/mL ESCHERICHIA COLI  Culture, blood (routine x 2)     Status: None (Preliminary result)   Collection Time: 01/24/18  2:30 PM  Result Value Ref Range Status   Specimen Description RIGHT ANTECUBITAL  Final   Special Requests   Final    BOTTLES DRAWN AEROBIC AND ANAEROBIC Blood Culture adequate volume   Culture   Final    NO GROWTH < 24 HOURS Performed at [facility], [address], [place], [place] [address]    Report Status PENDING  Incomplete  Culture, blood (routine x 2)     Status: None (Preliminary result)   Collection Time: 01/24/18  2:40 PM  Result Value Ref Range Status   Specimen Description BLOOD LEFT HAND  Final   Special Requests   Final    BOTTLES DRAWN AEROBIC ONLY Blood Culture results may not be optimal due to an inadequate volume of blood received in culture bottles   Culture   Final    NO GROWTH < 24 HOURS Performed at [facility], [address] [place], [place] [address]    Report Status PENDING  Incomplete     Radiology Studies: Ct Abdomen Pelvis Wo Contrast  Result Date: 01/24/2018 CLINICAL DATA:  Fever, cough, confusion, abdominal pain EXAM: CT ABDOMEN AND PELVIS WITHOUT CONTRAST TECHNIQUE: Multidetector CT imaging of the abdomen and pelvis was performed following the standard protocol without IV contrast. COMPARISON:  10/20/2015 FINDINGS: Limited evaluation due to motion degradation, streak artifact from the patient's arms, and lack of intravenous contrast administration. Lower chest: Mild subpleural patchy opacity in the lateral right middle lobe and right lower lobe. Hepatobiliary: Unenhanced liver is unremarkable. Status post cholecystectomy. No intrahepatic or extrahepatic ductal dilatation. Pancreas: Fatty parenchymal atrophy. Spleen: Within normal limits. Adrenals/Urinary Tract: Adrenal  glands are within normal limits. Right upper pole renal cyst and left renal sinus cysts. No renal calculi or hydronephrosis. Bladder is mildly thick-walled although underdistended. Stomach/Bowel: Stomach is notable for a moderate hiatal hernia. No evidence of bowel obstruction. Appendix is not discretely visualized. Sigmoid diverticulosis, without evidence of diverticulitis. Vascular/Lymphatic: No evidence of abdominal aortic aneurysm. Atherosclerotic calcifications of the abdominal aorta and branch vessels. No suspicious abdominopelvic lymphadenopathy. Reproductive: Status post hysterectomy. No adnexal masses. Other: No abdominopelvic ascites. Musculoskeletal: Degenerative changes of the visualized thoracolumbar spine. IMPRESSION: Limited evaluation, as described above. No evidence of bowel obstruction. Sigmoid diverticulosis, without evidence of diverticulitis. No CT findings to account for  the patient's abdominal pain. Mild patchy subpleural opacity in the right middle and lower lobes, nonspecific and poorly evaluated, but suggesting mild infection/pneumonia. Electronically Signed   By: Charline BillsSriyesh  Krishnan M.D.   On: 01/24/2018 20:23   Dg Chest 2 View  Result Date: 01/24/2018 CLINICAL DATA:  Altered level of consciousness. EXAM: CHEST - 2 VIEW COMPARISON:  Radiographs of January 23, 2018. FINDINGS: Stable cardiomegaly. No pneumothorax or pleural effusion is noted. No acute pulmonary disease is noted. Surgical clips are noted in both axillary regions. Bony thorax is unremarkable. IMPRESSION: No active cardiopulmonary disease. Electronically Signed   By: Lupita RaiderJames  Green Jr, M.D.   On: 01/24/2018 15:27   Ct Head Wo Contrast  Result Date: 01/24/2018 CLINICAL DATA:  Recent falls with altered level of consciousness EXAM: CT HEAD WITHOUT CONTRAST TECHNIQUE: Contiguous axial images were obtained from the base of the skull through the vertex without intravenous contrast. COMPARISON:  12/07/2014 FINDINGS: Brain: Diffuse  atrophic and chronic white matter ischemic changes are noted. No findings to suggest acute hemorrhage, acute infarction or space-occupying mass lesion are noted. Vascular: No hyperdense vessel or unexpected calcification. Skull: Normal. Negative for fracture or focal lesion. Sinuses/Orbits: No acute finding. Other: None. IMPRESSION: Chronic atrophic and ischemic changes without acute abnormality. Electronically Signed   By: Alcide CleverMark  Lukens M.D.   On: 01/24/2018 15:27    Scheduled Meds: . enoxaparin (LOVENOX) injection  40 mg Subcutaneous Q24H  . feeding supplement  1 Container Oral BID BM  . gabapentin  300 mg Oral Daily   And  . gabapentin  600 mg Oral QHS  . QUEtiapine  25 mg Oral QHS  . trimethoprim-polymyxin b  1 drop Both Eyes Q4H   Continuous Infusions: . 0.9 % NaCl with KCl 40 mEq / L    . azithromycin 500 mg (01/25/18 0256)  . cefTRIAXone (ROCEPHIN)  IV 1 g (01/25/18 0031)     LOS: 0 days    Time spent: 30 minutes   Vassie Lollarlos Broedy Osbourne, MD [facility] Pager [phone]  If 7PM-7AM, please contact night-coverage [email or web address] Password [email or web address] 01/25/2018, 5:47 PM

## 2018-01-26 ENCOUNTER — Inpatient Hospital Stay (HOSPITAL_COMMUNITY): Payer: Medicare Other

## 2018-01-26 LAB — BASIC METABOLIC PANEL
ANION GAP: 7 (ref 5–15)
BUN: 14 mg/dL (ref 8–23)
CO2: 23 mmol/L (ref 22–32)
Calcium: 8 mg/dL — ABNORMAL LOW (ref 8.9–10.3)
Chloride: 110 mmol/L (ref 98–111)
Creatinine, Ser: 0.9 mg/dL (ref 0.44–1.00)
GFR calc Af Amer: >60 mL/min (ref >60)
GFR calc non Af Amer: 55 mL/min — ABNORMAL LOW (ref >60)
Glucose, Bld: 113 mg/dL — ABNORMAL HIGH (ref 70–99)
Potassium: 4.4 mmol/L (ref 3.5–5.1)
Sodium: 140 mmol/L (ref 135–145)

## 2018-01-26 LAB — CBC
HEMATOCRIT: 39.4 % (ref 36.0–46.0)
Hemoglobin: 11.8 g/dL — ABNORMAL LOW (ref 12.0–15.0)
MCH: 29 pg (ref 26.0–34.0)
MCHC: 29.9 g/dL — ABNORMAL LOW (ref 30.0–36.0)
MCV: 96.8 fL (ref 80.0–100.0)
Platelets: 155 10*3/uL (ref 150–400)
RBC: 4.07 MIL/uL (ref 3.87–5.11)
RDW: 14.4 % (ref 11.5–15.5)
WBC: 11.1 10*3/uL — ABNORMAL HIGH (ref 4.0–10.5)
nRBC: 0 % (ref 0.0–0.2)

## 2018-01-26 LAB — URINE CULTURE: Special Requests: NORMAL

## 2018-01-26 LAB — TSH: TSH: 1.581 u[IU]/mL (ref 0.350–4.500)

## 2018-01-26 LAB — AMMONIA: Ammonia: <9 umol/L — ABNORMAL LOW (ref 9–35)

## 2018-01-26 MED ORDER — NEOMYCIN-POLYMYXIN-DEXAMETH 3.5-10000-0.1 OP SUSP
1.0000 [drp] | OPHTHALMIC | Status: DC
  Administered 2018-01-26 – 2018-01-27 (×8): 1 [drp] via OPHTHALMIC
  Filled 2018-01-26: qty 5

## 2018-01-26 MED ORDER — IBUPROFEN 100 MG/5ML PO SUSP
400.0000 mg | Freq: Three times a day (TID) | ORAL | Status: DC | PRN
  Administered 2018-01-26: 400 mg via ORAL
  Filled 2018-01-26: qty 20

## 2018-01-26 MED ORDER — IBUPROFEN 100 MG/5ML PO SUSP: 800.0000 mg | Freq: Four times a day (QID) | ORAL | Status: DC | PRN

## 2018-01-26 NOTE — Progress Notes (Addendum)
PROGRESS NOTE    Jennifer [name]  UJW:[id number] DOB: 1922/05/06 DOA: 01/24/2018 PCP: [name], MD   Brief Narrative:  85 y.o.femalewith medical history significant forDementia, COPD, HLD, CAD,osteoarthritis, spinal stenosis who presents to the ED with altered mental status. Patient is unable to provide history due to AMS/dementia therefore entirety of history is obtained from daughter, EDP, and chart review.  Per daughter, patient has had 4 falls in the last 7 days. She was seen in the ED yesterday without obvious acute issue on work-up and discharged home. She had significant change in mental status this morning per daughter with inability to stand on her own (normally ambulates with use of walker) or communicate/recognize family members. Normally she is able to recognize family and converse with simple questions. Daughter states that patient had a large mucoid malodorous episode of diarrhea the evening of 01/22/2018. Daughter has been treating with Imodium, therefore the patient has not had further frequent diarrhea.   Daughter states that patient has frequent UTIs and was last treated in early November, initially with ciprofloxacin which was switched to nitrofurantoin based on culture sensitivities. She has not had antibiotics since then. Daughter also states the patient has had recent poor appetite.   Assessment & Plan:   Principal Problem:   Altered mental status Active Problems:   COPD (chronic obstructive pulmonary disease) (HCC)   Dementia with behavioral disturbance (HCC)   Frequent falls   Hypokalemia  1-acute toxic encephalopathy: In the setting of E. coli UTI and pneumonia.   -Patient with underlying history of dementia and presenting acute hospital-acquired delirium. -Minimize the use of benzodiazepines or sedative agents -Hold gabapentin and Zyrtec as well as Seroquel today -Continue IV antibiotics -Provide supportive care -Clinical response has  been quite poor, and therefore I will recheck head CT, TSH, and ammonia levels  -Continue as needed nebulizer treatment -She is afebrile and at this moment with good O2 sat on room air. -WBC is elevated -Small subarachnoid hemorrhage noted on repeat head CT today with no clinical significance noted.  Discussed case with Dr. Yetta Barre of neurosurgery with no plans for further intervention.  2-E. coli UTI -Cultures demonstrating microorganism to be resistant to ampicillin -Continue therapy with ceftriaxone as currently in place.  3-COPD (chronic obstructive pulmonary disease) (HCC) -Currently stable and compensated -Patient is no wheezing -Continue as needed nebulizer treatment.  4-Dementia with behavioral disturbance (HCC) and high risk for delirium -Continue supportive care -Holding Seroquel for now -Hopefully will be able to be stabilize soon for expedite discharge  5-Frequent falls and physical deconditioning -Physical therapy has evaluated patient and recommended skilled nursing facility for rehab and conditioning. -Daughter in agreement. -Social worker is aware and assisting with placement.  6-Hypokalemia-resolved -Repleted and within normal limits -Follow electrolytes trend intermittently.  DVT prophylaxis: Lovenox to SCDs Code Status: DNR Family Communication: Daughter at bedside. Disposition Plan: Evaluated by physical therapy and found awfully weak and deconditioned.  Patient known ER her usual physical status at baseline and currently experiencing acute encephalopathy from active infection and hospital-acquired delirium.  Recommendations given for skilled nursing facility at discharge.  Head CT as well as lab studies pending today due to persistence of altered mentation.  Consultants:   None  Procedures:   See below for x-ray report.  Antimicrobials:   Azithromycin and Rocephin 1/13->   Subjective: Patient seen and evaluated today with ongoing somnolence  and altered mentation that has not improved in the last 24 to 48 hours according to daughter  at bedside.  No other acute events noted overnight.  Objective: Vitals:   01/25/18 1601 01/25/18 2126 01/25/18 2232 01/26/18 0614  BP: (!) 163/99  (!) 153/66 131/77  Pulse: 97  86 93  Resp: 20  16 17   Temp: (!) 101.5 F (38.6 C)  (!) 100.6 F (38.1 C) 99.6 F (37.6 C)  TempSrc: Oral  Oral Oral  SpO2: 96% 96% 94% 95%  Weight:      Height:        Intake/Output Summary (Last 24 hours) at 01/26/2018 1047 Last data filed at 01/26/2018 0700 Gross per 24 hour  Intake 973.6 ml  Output 500 ml  Net 473.6 ml   Filed Weights   01/24/18 2129 01/24/18 2315  Weight: 91.9 kg 91.9 kg    Examination:  General exam: Lethargic Respiratory system: Clear to auscultation. Respiratory effort normal. Cardiovascular system: S1 & S2 heard, RRR. No JVD, murmurs, rubs, gallops or clicks. No pedal edema. Gastrointestinal system: Abdomen is nondistended, soft and nontender. No organomegaly or masses felt. Normal bowel sounds heard. Central nervous system: Somnolent and exam could not be completed. Extremities: Without edema. Skin: No rashes, lesions or ulcers Psychiatry: Cannot be assessed    Data Reviewed: I have personally reviewed following labs and imaging studies  CBC: Recent Labs  Lab 01/23/18 1322 01/24/18 1444 01/25/18 0540 01/26/18 0550  WBC 8.2 14.7* 13.4* 11.1*  NEUTROABS 5.8 12.5*  --   --   HGB 12.7 11.7* 12.8 11.8*  HCT 43.2 41.5 42.9 39.4  MCV 100.5* 103.0* 100.2* 96.8  PLT 156 137* 143* 155   Basic Metabolic Panel: Recent Labs  Lab 01/23/18 1322 01/24/18 1444 01/25/18 0540 01/26/18 0550  NA 144 138 139 140  K 3.6 3.2* 3.9 4.4  CL 109 105 106 110  CO2 28 24 24 23   GLUCOSE 93 110* 127* 113*  BUN 16 13 11 14   CREATININE 0.86 0.86 0.71 0.90  CALCIUM 8.4* 7.7* 8.1* 8.0*  MG  --  1.8  --   --    GFR: Estimated Creatinine Clearance: 43.8 mL/min (by C-G formula based on SCr  of 0.9 mg/dL). Liver Function Tests: Recent Labs  Lab 01/23/18 1322 01/24/18 1444  AST 16 20  ALT 12 13  ALKPHOS 93 98  BILITOT 0.7 0.9  PROT 6.7 6.6  ALBUMIN 3.4* 3.1*   Recent Labs  Lab 01/23/18 1322  LIPASE 17   No results for input(s): AMMONIA in the last 168 hours. Coagulation Profile: No results for input(s): INR, PROTIME in the last 168 hours. Cardiac Enzymes: Recent Labs  Lab 01/23/18 1322 01/24/18 1444  TROPONINI <0.03 0.03*   BNP (last 3 results) No results for input(s): PROBNP in the last 8760 hours. HbA1C: No results for input(s): HGBA1C in the last 72 hours. CBG: No results for input(s): GLUCAP in the last 168 hours. Lipid Profile: No results for input(s): CHOL, HDL, LDLCALC, TRIG, CHOLHDL, LDLDIRECT in the last 72 hours. Thyroid Function Tests: No results for input(s): TSH, T4TOTAL, FREET4, T3FREE, THYROIDAB in the last 72 hours. Anemia Panel: No results for input(s): VITAMINB12, FOLATE, FERRITIN, TIBC, IRON, RETICCTPCT in the last 72 hours. Sepsis Labs: Recent Labs  Lab 01/24/18 1444 01/24/18 1544  LATICACIDVEN 1.4 1.3    Recent Results (from the past 240 hour(s))  Urine culture     Status: Abnormal   Collection Time: 01/23/18  3:51 PM  Result Value Ref Range Status   Specimen Description   Final    URINE,  CATHETERIZED Performed at [facility], [address] [place], [place] [address]    Special Requests   Final    Normal Performed at [facility], [address] [place], [place] [address]    Culture 50,000 COLONIES/mL ESCHERICHIA COLI (A)  Final   Report Status 01/25/2018 FINAL  Final   Organism ID, Bacteria ESCHERICHIA COLI (A)  Final      Susceptibility   Escherichia coli - MIC*    AMPICILLIN >=32 RESISTANT Resistant     CEFAZOLIN <=4 SENSITIVE Sensitive     CEFTRIAXONE <=1 SENSITIVE Sensitive     CIPROFLOXACIN >=4 RESISTANT Resistant     GENTAMICIN <=1 SENSITIVE Sensitive     IMIPENEM <=0.25 SENSITIVE Sensitive      NITROFURANTOIN <=16 SENSITIVE Sensitive     TRIMETH/SULFA <=20 SENSITIVE Sensitive     AMPICILLIN/SULBACTAM >=32 RESISTANT Resistant     PIP/TAZO <=4 SENSITIVE Sensitive     Extended ESBL NEGATIVE Sensitive     * 50,000 COLONIES/mL ESCHERICHIA COLI  Urine culture     Status: None   Collection Time: 01/24/18  1:50 PM  Result Value Ref Range Status   Specimen Description   Final    URINE, CATHETERIZED Performed at [facility], [address] [place], [place] [address]    Special Requests   Final    Normal Performed at [facility], [address] [place], [place] [address]    Culture   Final    Multiple bacterial morphotypes present, none predominant. Suggest appropriate recollection if clinically indicated.   Report Status 01/26/2018 FINAL  Final  Culture, blood (routine x 2)     Status: None (Preliminary result)   Collection Time: 01/24/18  2:30 PM  Result Value Ref Range Status   Specimen Description RIGHT ANTECUBITAL  Final   Special Requests   Final    BOTTLES DRAWN AEROBIC AND ANAEROBIC Blood Culture adequate volume   Culture   Final    NO GROWTH 2 DAYS Performed at [facility], [address] [place], [place] [address]    Report Status PENDING  Incomplete  Culture, blood (routine x 2)     Status: None (Preliminary result)   Collection Time: 01/24/18  2:40 PM  Result Value Ref Range Status   Specimen Description BLOOD LEFT HAND  Final   Special Requests   Final    BOTTLES DRAWN AEROBIC ONLY Blood Culture results may not be optimal due to an inadequate volume of blood received in culture bottles   Culture   Final    NO GROWTH 2 DAYS Performed at [facility], [address] [place], [place] [address]    Report Status PENDING  Incomplete         Radiology Studies: Ct Abdomen Pelvis Wo Contrast  Result Date: 01/24/2018 CLINICAL DATA:  Fever, cough, confusion, abdominal pain EXAM: CT ABDOMEN AND PELVIS WITHOUT CONTRAST TECHNIQUE: Multidetector CT imaging of the  abdomen and pelvis was performed following the standard protocol without IV contrast. COMPARISON:  10/20/2015 FINDINGS: Limited evaluation due to motion degradation, streak artifact from the patient's arms, and lack of intravenous contrast administration. Lower chest: Mild subpleural patchy opacity in the lateral right middle lobe and right lower lobe. Hepatobiliary: Unenhanced liver is unremarkable. Status post cholecystectomy. No intrahepatic or extrahepatic ductal dilatation. Pancreas: Fatty parenchymal atrophy. Spleen: Within normal limits. Adrenals/Urinary Tract: Adrenal glands are within normal limits. Right upper pole renal cyst and left renal sinus cysts. No renal calculi or hydronephrosis. Bladder is mildly thick-walled  although underdistended. Stomach/Bowel: Stomach is notable for a moderate hiatal hernia. No evidence of bowel obstruction. Appendix is not discretely visualized. Sigmoid diverticulosis, without evidence of diverticulitis. Vascular/Lymphatic: No evidence of abdominal aortic aneurysm. Atherosclerotic calcifications of the abdominal aorta and branch vessels. No suspicious abdominopelvic lymphadenopathy. Reproductive: Status post hysterectomy. No adnexal masses. Other: No abdominopelvic ascites. Musculoskeletal: Degenerative changes of the visualized thoracolumbar spine. IMPRESSION: Limited evaluation, as described above. No evidence of bowel obstruction. Sigmoid diverticulosis, without evidence of diverticulitis. No CT findings to account for the patient's abdominal pain. Mild patchy subpleural opacity in the right middle and lower lobes, nonspecific and poorly evaluated, but suggesting mild infection/pneumonia. Electronically Signed   By: Charline BillsSriyesh  Krishnan M.D.   On: 01/24/2018 20:23   Dg Chest 2 View  Result Date: 01/24/2018 CLINICAL DATA:  Altered level of consciousness. EXAM: CHEST - 2 VIEW COMPARISON:  Radiographs of January 23, 2018. FINDINGS: Stable cardiomegaly. No pneumothorax or  pleural effusion is noted. No acute pulmonary disease is noted. Surgical clips are noted in both axillary regions. Bony thorax is unremarkable. IMPRESSION: No active cardiopulmonary disease. Electronically Signed   By: Lupita RaiderJames  Green Jr, M.D.   On: 01/24/2018 15:27   Ct Head Wo Contrast  Result Date: 01/24/2018 CLINICAL DATA:  Recent falls with altered level of consciousness EXAM: CT HEAD WITHOUT CONTRAST TECHNIQUE: Contiguous axial images were obtained from the base of the skull through the vertex without intravenous contrast. COMPARISON:  12/07/2014 FINDINGS: Brain: Diffuse atrophic and chronic white matter ischemic changes are noted. No findings to suggest acute hemorrhage, acute infarction or space-occupying mass lesion are noted. Vascular: No hyperdense vessel or unexpected calcification. Skull: Normal. Negative for fracture or focal lesion. Sinuses/Orbits: No acute finding. Other: None. IMPRESSION: Chronic atrophic and ischemic changes without acute abnormality. Electronically Signed   By: Alcide CleverMark  Lukens M.D.   On: 01/24/2018 15:27        Scheduled Meds: . enoxaparin (LOVENOX) injection  40 mg Subcutaneous Q24H  . feeding supplement  1 Container Oral BID BM  . neomycin-polymyxin b-dexamethasone  1 drop Both Eyes Q4H   Continuous Infusions: . azithromycin 500 mg (01/25/18 2148)  . cefTRIAXone (ROCEPHIN)  IV 1 g (01/25/18 2334)     LOS: 1 day    Time spent: 30 minutes    Saleema Weppler Hoover Brunette Nathalee Smarr, DO [facility] Pager [phone]  If 7PM-7AM, please contact night-coverage [email or web address] Password [email or web address] 01/26/2018, 10:47 AM

## 2018-01-26 NOTE — Clinical Social Work Note (Signed)
Patient's Daughter, [name], who advised that she did not know of any other facilities that she was interested in patient going to and request that she be called back tomorrow.     Kadyn Guild, Juleen China, LCSW

## 2018-01-26 NOTE — Progress Notes (Signed)
Pt temp 101.9 tylenol suppository given per PRN order. Recheck temp one hour later 102. MD paged to make aware. New orders received will continue to monitor and update family at bedside.

## 2018-01-26 NOTE — Progress Notes (Signed)
Patient had 9  beats of VT and another 6 beats of VT unsustained. On re-assessment, patient was resting quietly with her eyes closed, respirations were even and unlabored. Dr. Sharl Ma notified without any new order. Will continue to monitor.

## 2018-01-26 NOTE — Progress Notes (Signed)
OT Cancellation Note  Patient Details Name: Jennifer [name] MRN: [id number] DOB: Apr 23, 1922   Cancelled Treatment:    Reason Eval/Treat Not Completed: Patient's level of consciousness. Pt sleeping soundly, unable to be awakened for OT evaluation with verbal or tactile cuing. Pt with movement of eyelids, however never fully opened eyes or awakened. Will attempt evaluation again at a later time when pt is able to participate.   Ezra Sites, OTR/L  [phone] 01/26/2018, 8:35 AM

## 2018-01-26 NOTE — Progress Notes (Signed)
Pt's daughter informed of pt's temp and order for cooling blanket. Daughter does not want cooling blanket to be placed. Daughter states pt will be uncomfortable. Will continue to monitor. Agreeable for pt to have medication.

## 2018-01-27 LAB — CBC
HCT: 43 % (ref 36.0–46.0)
Hemoglobin: 12.7 g/dL (ref 12.0–15.0)
MCH: 29.2 pg (ref 26.0–34.0)
MCHC: 29.5 g/dL — ABNORMAL LOW (ref 30.0–36.0)
MCV: 98.9 fL (ref 80.0–100.0)
Platelets: 167 10*3/uL (ref 150–400)
RBC: 4.35 MIL/uL (ref 3.87–5.11)
RDW: 14.5 % (ref 11.5–15.5)
WBC: 11.1 10*3/uL — ABNORMAL HIGH (ref 4.0–10.5)
nRBC: 0 % (ref 0.0–0.2)

## 2018-01-27 LAB — BASIC METABOLIC PANEL
Anion gap: 10 (ref 5–15)
BUN: 12 mg/dL (ref 8–23)
CO2: 21 mmol/L — ABNORMAL LOW (ref 22–32)
Calcium: 8.2 mg/dL — ABNORMAL LOW (ref 8.9–10.3)
Chloride: 109 mmol/L (ref 98–111)
Creatinine, Ser: 0.74 mg/dL (ref 0.44–1.00)
GFR calc Af Amer: >60 mL/min (ref >60)
GFR calc non Af Amer: >60 mL/min (ref >60)
Glucose, Bld: 91 mg/dL (ref 70–99)
Potassium: 4.1 mmol/L (ref 3.5–5.1)
Sodium: 140 mmol/L (ref 135–145)

## 2018-01-27 MED ORDER — LIDOCAINE 5 % EX PTCH
1.0000 | MEDICATED_PATCH | CUTANEOUS | Status: DC
  Administered 2018-01-27 – 2018-01-28 (×2): 1 via TRANSDERMAL
  Filled 2018-01-27 (×2): qty 1

## 2018-01-27 MED ORDER — CEFDINIR 300 MG PO CAPS
300.0000 mg | ORAL_CAPSULE | Freq: Two times a day (BID) | ORAL | Status: DC
  Administered 2018-01-27 – 2018-01-28 (×2): 300 mg via ORAL
  Filled 2018-01-27 (×2): qty 1

## 2018-01-27 MED ORDER — CELECOXIB 100 MG PO CAPS
100.0000 mg | ORAL_CAPSULE | Freq: Every day | ORAL | Status: DC
  Administered 2018-01-27 – 2018-01-28 (×2): 100 mg via ORAL
  Filled 2018-01-27 (×2): qty 1

## 2018-01-27 MED ORDER — FLUTICASONE PROPIONATE 50 MCG/ACT NA SUSP
1.0000 | Freq: Every day | NASAL | Status: DC
  Administered 2018-01-27 – 2018-01-28 (×2): 1 via NASAL
  Filled 2018-01-27: qty 16

## 2018-01-27 MED ORDER — FAMOTIDINE 20 MG PO TABS
20.0000 mg | ORAL_TABLET | Freq: Two times a day (BID) | ORAL | Status: DC
  Administered 2018-01-27 – 2018-01-28 (×4): 20 mg via ORAL
  Filled 2018-01-27 (×4): qty 1

## 2018-01-27 MED ORDER — ARTIFICIAL TEARS OPHTHALMIC OINT
TOPICAL_OINTMENT | Freq: Four times a day (QID) | OPHTHALMIC | Status: DC
  Administered 2018-01-27 – 2018-01-28 (×3): via OPHTHALMIC
  Filled 2018-01-27 (×2): qty 3.5

## 2018-01-27 MED ORDER — GABAPENTIN 100 MG PO CAPS
100.0000 mg | ORAL_CAPSULE | Freq: Every day | ORAL | Status: DC
  Administered 2018-01-27: 100 mg via ORAL
  Filled 2018-01-27: qty 1

## 2018-01-27 MED ORDER — MUSCLE RUB 10-15 % EX CREA
TOPICAL_CREAM | CUTANEOUS | Status: DC | PRN
  Filled 2018-01-27: qty 85

## 2018-01-27 MED ORDER — ONDANSETRON HCL 4 MG PO TABS
4.0000 mg | ORAL_TABLET | Freq: Once | ORAL | Status: AC
  Administered 2018-01-27: 4 mg via ORAL
  Filled 2018-01-27: qty 1

## 2018-01-27 MED ORDER — ONDANSETRON HCL 4 MG/2ML IJ SOLN: 4.0000 mg | Freq: Four times a day (QID) | INTRAMUSCULAR | Status: DC | PRN

## 2018-01-27 MED ORDER — AZITHROMYCIN 250 MG PO TABS
250.0000 mg | ORAL_TABLET | Freq: Every day | ORAL | Status: DC
  Administered 2018-01-27 – 2018-01-28 (×2): 250 mg via ORAL
  Filled 2018-01-27 (×2): qty 1

## 2018-01-27 MED ORDER — AZITHROMYCIN 250 MG PO TABS
250.0000 mg | ORAL_TABLET | Freq: Once | ORAL | Status: AC
  Administered 2018-01-27: 250 mg via ORAL
  Filled 2018-01-27: qty 1

## 2018-01-27 NOTE — Evaluation (Signed)
Occupational Therapy Evaluation Patient Details Name: Jennifer [name] MRN: [id number] DOB: 07-04-1922 Today's Date: 01/27/2018    History of Present Illness Jennifer [name] is a 85 y.o. female with medical history significant for Dementia, COPD, HLD, CAD, osteoarthritis, spinal stenosis who presents to the ED with altered mental status.  Patient is unable to provide history due to AMS/dementia therefore entirety of history is obtained from daughter, EDP, and chart review.  Per daughter, patient has had 4 falls in the last 7 days.  She was seen in the ED yesterday without obvious acute issue on work-up and discharged home.  She had significant change in mental status this morning per daughter with inability to stand on her own (normally ambulates with use of walker) or communicate/recognize family members.  Normally she is able to recognize family and converse with simple questions.  Daughter states that patient had a large mucoid malodorous episode of diarrhea the evening of 01/22/2018.  Daughter has been treating with Imodium, therefore the patient has not had further frequent diarrhea.      Clinical Impression   Pt received supine in bed, awake and marginally agreeable to OT evaluation. Pt limited due to fatigue and weakness, therefore bed level evaluation completed this am. Pt with significant weakness in BUE limiting ability to assist in bed mobility and ADLs using BUE strength. Pt able to complete tasks at bed level with set-up and increased time. Pt reports independence in ADLs at home, however per chart review family provides assistance-unclear how much. Recommend SNF on discharge to improve pt safety and independence in ADL completion, as well as improve strength required for ADL completion. No further acute services at this time, will defer further treatment to OT staff at [facility].     Follow Up Recommendations  SNF    Equipment Recommendations  None recommended by OT       Precautions /  Restrictions Precautions Precautions: Fall Restrictions Weight Bearing Restrictions: No      Mobility Bed Mobility               General bed mobility comments: Not completed  Transfers                 General transfer comment: Not completed        ADL either performed or assessed with clinical judgement   ADL Overall ADL's : Needs assistance/impaired Eating/Feeding: Set up;Bed level Eating/Feeding Details (indicate cue type and reason): Pt unable to open containers or prepare food due to BUE weakness. Pt able to feed self with approximately 50% spillage Grooming: Set up;Bed level                                       Vision Baseline Vision/History: Wears glasses Wears Glasses: At all times Patient Visual Report: No change from baseline Vision Assessment?: No apparent visual deficits            Pertinent Vitals/Pain Pain Assessment: No/denies pain     Hand Dominance Right   Extremity/Trunk Assessment Upper Extremity Assessment Upper Extremity Assessment: Generalized weakness(BUE grossly 3-/5 throughout)   Lower Extremity Assessment Lower Extremity Assessment: Defer to PT evaluation   Cervical / Trunk Assessment Cervical / Trunk Assessment: Kyphotic   Communication Communication Communication: HOH   Cognition Arousal/Alertness: Awake/alert Behavior During Therapy: WFL for tasks assessed/performed Overall Cognitive Status: History of cognitive impairments - at baseline  Home Living Family/patient expects to be discharged to:: Private residence Living Arrangements: Children Available Help at Discharge: Family Type of Home: House       Home Layout: Multi-level;Able to live on main level with bedroom/bathroom               Home Equipment: Dan Humphreys - 2 wheels;Wheelchair - manual   Additional Comments: Patient is poor historian, info obtained previous  admission notes      Prior Functioning/Environment Level of Independence: Needs assistance  Gait / Transfers Assistance Needed: household ambulator with RW ADL's / Homemaking Assistance Needed: assisted by family            OT Problem List: Decreased strength;Decreased activity tolerance;Impaired balance (sitting and/or standing);Decreased safety awareness;Impaired UE functional use       End of Session    Activity Tolerance: Patient limited by fatigue Patient left: in bed;with call bell/phone within reach;with bed alarm set  OT Visit Diagnosis: Muscle weakness (generalized) (M62.81);Repeated falls (R29.6)                Time: 9509-3267 OT Time Calculation (min): 24 min Charges:  OT General Charges $OT Visit: 1 Visit OT Evaluation $OT Eval Moderate Complexity: 1 [address] OTR/L  [phone] 01/27/2018, 8:47 AM

## 2018-01-27 NOTE — Progress Notes (Signed)
PROGRESS NOTE    Jennifer [name]  TAV:[id number] DOB: 04-Dec-1922 DOA: 01/24/2018 PCP: [name], MD   Brief Narrative:  85 y.o.femalewith medical history significant forDementia, COPD, HLD, CAD,osteoarthritis, spinal stenosis who presents to the ED with altered mental status. Patient is unable to provide history due to AMS/dementia therefore entirety of history is obtained from daughter, EDP, and chart review.  Per daughter, patient has had 4 falls in the last 7 days. She was seen in the ED yesterday without obvious acute issue on work-up and discharged home. She had significant change in mental status this morning per daughter with inability to stand on her own (normally ambulates with use of walker) or communicate/recognize family members. Normally she is able to recognize family and converse with simple questions. Daughter states that patient had a large mucoid malodorous episode of diarrhea the evening of 01/22/2018. Daughter has been treating with Imodium, therefore the patient has not had further frequent diarrhea.   Daughter states that patient has frequent UTIs and was last treated in early November, initially with ciprofloxacin which was switched to nitrofurantoin based on culture sensitivities. She has not had antibiotics since then. Daughter also states the patient has had recent poor appetite.   Assessment & Plan:   Principal Problem:   Altered mental status Active Problems:   COPD (chronic obstructive pulmonary disease) (HCC)   Dementia with behavioral disturbance (HCC)   Frequent falls   Hypokalemia  1-acute toxic encephalopathy with polypharmacy: In the setting of E. coli UTI and pneumonia. -Patient with underlying history of dementia and presenting acute hospital-acquired delirium. -Minimize the use of benzodiazepines or sedative agents -Hold continue to hold Zyrtec and Seroquel along with other sedating agents, but will resume gabapentin at  low-dose at bedtime. -Continue IV antibiotics -Provide supportive care -Clinically patient is much improved with normal TSH and ammonia levels.  Subarachnoid bleed noted on head CT. -Continue as needed nebulizer treatment -She is afebrile and at this moment with good O2 sat on room air. -WBC is elevated, but no growth noted on repeat blood cultures. -Small subarachnoid hemorrhage noted on repeat head CT today with no clinical significance noted.  Discussed case with Dr. Yetta Barre of neurosurgery with no plans for further intervention.  2-E. coli UTI -Cultures demonstrating microorganism to be resistant to ampicillin -Continue therapy with Omnicef at this point.  3-COPD (chronic obstructive pulmonary disease) (HCC) -Currently stable and compensated -Patient is no wheezing -Continue as needed nebulizer treatment. -Continue oral azithromycin.  4-Dementia with behavioral disturbance (HCC)and high risk for delirium -Continue supportive care -Holding Seroquel for now -Hopefully will be able to be stabilizesoonfor expedite discharge  5-Frequent fallsand physical deconditioning -Physical therapy has evaluated patient and recommended skilled nursing facility for rehab and conditioning. -Daughter in agreement. -Social worker is aware and assisting with placement.  6-Hypokalemia-resolved -Repleted and within normal limits -Follow electrolytes trend intermittently.  DVT prophylaxis:SCDs Code Status:DNR Family Communication:Daughter at bedside. Disposition Plan:Evaluated by physical therapy and found awfully weak and deconditioned. Patient known ER her usual physical status at baseline and currently experiencing acute encephalopathy from active infection and hospital-acquired delirium. Recommendations given for skilled nursing facility at discharge.   Head CT with subarachnoid bleed noted, but of unknown significance.  She is clinically better this morning and will resume some  gabapentin at bedtime.  Likely will be stable for discharge to SNF tomorrow.  Consultants:  None  Procedures:  See below for x-ray report.  Antimicrobials:   Azithromycin and Rocephin IV 1/13->1/16  Oral azithromycin and Omnicef 1/16->  Subjective: Patient seen and evaluated today with no new acute complaints or concerns. No acute concerns or events noted overnight.  She is doing much better this morning and is conversational and alert and awake.  Daughter is at bedside and quite pleased with her return to baseline level of functioning.  Objective: Vitals:   01/26/18 1823 01/26/18 2104 01/26/18 2250 01/27/18 0547  BP:   (!) 152/78 (!) 158/88  Pulse:   84 77  Resp:   20 18  Temp: 97.8 F (36.6 C)  99.1 F (37.3 C) 99.2 F (37.3 C)  TempSrc: Oral  Oral Oral  SpO2:  93% 97% 97%  Weight:      Height:        Intake/Output Summary (Last 24 hours) at 01/27/2018 1142 Last data filed at 01/26/2018 1700 Gross per 24 hour  Intake 1477.32 ml  Output -  Net 1477.32 ml   Filed Weights   01/24/18 2129 01/24/18 2315  Weight: 91.9 kg 91.9 kg    Examination:  General exam: Appears calm and comfortable  Respiratory system: Clear to auscultation. Respiratory effort normal. Cardiovascular system: S1 & S2 heard, RRR. No JVD, murmurs, rubs, gallops or clicks. No pedal edema. Gastrointestinal system: Abdomen is nondistended, soft and nontender. No organomegaly or masses felt. Normal bowel sounds heard. Central nervous system: Alert and oriented. No focal neurological deficits. Extremities: Symmetric 5 x 5 power. Skin: No rashes, lesions or ulcers Psychiatry: Cannot be evaluated.    Data Reviewed: I have personally reviewed following labs and imaging studies  CBC: Recent Labs  Lab 01/23/18 1322 01/24/18 1444 01/25/18 0540 01/26/18 0550  WBC 8.2 14.7* 13.4* 11.1*  NEUTROABS 5.8 12.5*  --   --   HGB 12.7 11.7* 12.8 11.8*  HCT 43.2 41.5 42.9 39.4  MCV 100.5* 103.0*  100.2* 96.8  PLT 156 137* 143* 155   Basic Metabolic Panel: Recent Labs  Lab 01/23/18 1322 01/24/18 1444 01/25/18 0540 01/26/18 0550 01/27/18 1000  NA 144 138 139 140 140  K 3.6 3.2* 3.9 4.4 4.1  CL 109 105 106 110 109  CO2 28 24 24 23  21*  GLUCOSE 93 110* 127* 113* 91  BUN 16 13 11 14 12   CREATININE 0.86 0.86 0.71 0.90 0.74  CALCIUM 8.4* 7.7* 8.1* 8.0* 8.2*  MG  --  1.8  --   --   --    GFR: Estimated Creatinine Clearance: 49.2 mL/min (by C-G formula based on SCr of 0.74 mg/dL). Liver Function Tests: Recent Labs  Lab 01/23/18 1322 01/24/18 1444  AST 16 20  ALT 12 13  ALKPHOS 93 98  BILITOT 0.7 0.9  PROT 6.7 6.6  ALBUMIN 3.4* 3.1*   Recent Labs  Lab 01/23/18 1322  LIPASE 17   Recent Labs  Lab 01/26/18 1311  AMMONIA <9*   Coagulation Profile: No results for input(s): INR, PROTIME in the last 168 hours. Cardiac Enzymes: Recent Labs  Lab 01/23/18 1322 01/24/18 1444  TROPONINI <0.03 0.03*   BNP (last 3 results) No results for input(s): PROBNP in the last 8760 hours. HbA1C: No results for input(s): HGBA1C in the last 72 hours. CBG: No results for input(s): GLUCAP in the last 168 hours. Lipid Profile: No results for input(s): CHOL, HDL, LDLCALC, TRIG, CHOLHDL, LDLDIRECT in the last 72 hours. Thyroid Function Tests: Recent Labs    01/26/18 1311  TSH 1.581   Anemia Panel: No results for input(s): VITAMINB12, FOLATE, FERRITIN, TIBC, IRON,  RETICCTPCT in the last 72 hours. Sepsis Labs: Recent Labs  Lab 01/24/18 1444 01/24/18 1544  LATICACIDVEN 1.4 1.3    Recent Results (from the past 240 hour(s))  Urine culture     Status: Abnormal   Collection Time: 01/23/18  3:51 PM  Result Value Ref Range Status   Specimen Description   Final    URINE, CATHETERIZED Performed at [facility], [address] [place], [place] [address]    Special Requests   Final    Normal Performed at [facility], [address] [place], [place] [address]    Culture  50,000 COLONIES/mL ESCHERICHIA COLI (A)  Final   Report Status 01/25/2018 FINAL  Final   Organism ID, Bacteria ESCHERICHIA COLI (A)  Final      Susceptibility   Escherichia coli - MIC*    AMPICILLIN >=32 RESISTANT Resistant     CEFAZOLIN <=4 SENSITIVE Sensitive     CEFTRIAXONE <=1 SENSITIVE Sensitive     CIPROFLOXACIN >=4 RESISTANT Resistant     GENTAMICIN <=1 SENSITIVE Sensitive     IMIPENEM <=0.25 SENSITIVE Sensitive     NITROFURANTOIN <=16 SENSITIVE Sensitive     TRIMETH/SULFA <=20 SENSITIVE Sensitive     AMPICILLIN/SULBACTAM >=32 RESISTANT Resistant     PIP/TAZO <=4 SENSITIVE Sensitive     Extended ESBL NEGATIVE Sensitive     * 50,000 COLONIES/mL ESCHERICHIA COLI  Urine culture     Status: None   Collection Time: 01/24/18  1:50 PM  Result Value Ref Range Status   Specimen Description   Final    URINE, CATHETERIZED Performed at [facility], [address], [place], [place] [address]    Special Requests   Final    Normal Performed at [facility], [address], [place], [place] [address]    Culture   Final    Multiple bacterial morphotypes present, none predominant. Suggest appropriate recollection if clinically indicated.   Report Status 01/26/2018 FINAL  Final  Culture, blood (routine x 2)     Status: None (Preliminary result)   Collection Time: 01/24/18  2:30 PM  Result Value Ref Range Status   Specimen Description RIGHT ANTECUBITAL  Final   Special Requests   Final    BOTTLES DRAWN AEROBIC AND ANAEROBIC Blood Culture adequate volume   Culture   Final    NO GROWTH 3 DAYS Performed at [facility], [address] [place], [place] [address]    Report Status PENDING  Incomplete  Culture, blood (routine x 2)     Status: None (Preliminary result)   Collection Time: 01/24/18  2:40 PM  Result Value Ref Range Status   Specimen Description BLOOD LEFT HAND  Final   Special Requests   Final    BOTTLES DRAWN AEROBIC ONLY Blood Culture results may not be optimal due to an  inadequate volume of blood received in culture bottles   Culture   Final    NO GROWTH 3 DAYS Performed at [facility], [address], [place], [place] [address]    Report Status PENDING  Incomplete  Culture, blood (routine x 2)     Status: None (Preliminary result)   Collection Time: 01/26/18  5:10 PM  Result Value Ref Range Status   Specimen Description LEFT ANTECUBITAL  Final   Special Requests   Final    BOTTLES DRAWN AEROBIC AND ANAEROBIC Blood Culture adequate volume   Culture   Final    NO GROWTH < 24 HOURS Performed at [facility], [address] [place], [place] [address]    Report Status PENDING  Incomplete  Culture, blood (routine x 2)     Status: None (Preliminary result)   Collection Time: 01/26/18  5:17 PM  Result Value Ref Range Status   Specimen Description BLOOD LEFT HAND  Final   Special Requests   Final    BOTTLES DRAWN AEROBIC AND ANAEROBIC Blood Culture adequate volume   Culture   Final    NO GROWTH < 24 HOURS Performed at [facility], [address], [place], [place] [address]    Report Status PENDING  Incomplete         Radiology Studies: Ct Head Wo Contrast  Result Date: 01/26/2018 CLINICAL DATA:  Altered level of consciousness. EXAM: CT HEAD WITHOUT CONTRAST TECHNIQUE: Contiguous axial images were obtained from the base of the skull through the vertex without intravenous contrast. COMPARISON:  01/24/2018 FINDINGS: Brain: Linear high-density area noted in the posterior right parietal region concerning for a small amount of subarachnoid hemorrhage. This is new since prior study. There is atrophy and chronic small vessel disease changes. No hydrocephalus. No intraparenchymal hemorrhage or acute infarction. Vascular: No hyperdense vessel or unexpected calcification. Skull: No acute calvarial abnormality. Sinuses/Orbits: Visualized paranasal sinuses and mastoids clear. Orbital soft tissues unremarkable. Other: None IMPRESSION: Findings concerning for small  subarachnoid bleed in the right posterior parietal region, new since prior study. Atrophy, chronic small vessel disease. These results will be called to the ordering clinician or representative by the Radiologist Assistant, and communication documented in the PACS or zVision Dashboard. Electronically Signed   By: Charlett Nose M.D.   On: 01/26/2018 12:08        Scheduled Meds: . celecoxib  100 mg Oral Daily  . famotidine  20 mg Oral BID  . feeding supplement  1 Container Oral BID BM  . gabapentin  100 mg Oral QHS  . lidocaine  1 patch Transdermal Q24H  . neomycin-polymyxin b-dexamethasone  1 drop Both Eyes Q4H   Continuous Infusions:   LOS: 2 days    Time spent: 30 minutes    Pratik Hoover Brunette, DO [facility] Pager [phone]  If 7PM-7AM, please contact night-coverage [email or web address] Password [facility] 01/27/2018, 11:42 AM

## 2018-01-27 NOTE — Progress Notes (Signed)
Physical Therapy Treatment Patient Details Name: Jennifer [name] MRN: [id number] DOB: November 15, 1922 Today's Date: 01/27/2018    History of Present Illness Jennifer [name] is a 85 y.o. female with medical history significant for Dementia, COPD, HLD, CAD, osteoarthritis, spinal stenosis who presents to the ED with altered mental status.  Patient is unable to provide history due to AMS/dementia therefore entirety of history is obtained from daughter, EDP, and chart review.  Per daughter, patient has had 4 falls in the last 7 days.  She was seen in the ED yesterday without obvious acute issue on work-up and discharged home.  She had significant change in mental status this morning per daughter with inability to stand on her own (normally ambulates with use of walker) or communicate/recognize family members.  Normally she is able to recognize family and converse with simple questions.  Daughter states that patient had a large mucoid malodorous episode of diarrhea the evening of 01/22/2018.  Daughter has been treating with Imodium, therefore the patient has not had further frequent diarrhea.       PT Comments    Patient presents much more alert and able to follow directions consistently with repeated verbal/tactile cueing more likely due to patient very hard of hearing.  Patient demonstrates labored movement for sitting up at bedside and limited to a few steps to transfer to chair due to BLE weakness/poor standing balance.  Patient tolerated sitting up in chair after therapy - RN notified.  Patient will benefit from continued physical therapy in hospital and recommended venue below to increase strength, balance, endurance for safe ADLs and gait.    Follow Up Recommendations  SNF     Equipment Recommendations  None recommended by PT    Recommendations for Other Services       Precautions / Restrictions Precautions Precautions: Fall Restrictions Weight Bearing Restrictions: No    Mobility  Bed  Mobility Overal bed mobility: Needs Assistance Bed Mobility: Supine to Sit     Supine to sit: Min assist     General bed mobility comments: slow labored movement  Transfers Overall transfer level: Needs assistance Equipment used: Rolling walker (2 wheeled) Transfers: Sit to/from [employer] Sit to Stand: Mod assist Stand pivot transfers: Mod assist;Max assist       General transfer comment: difficulty with sit to stands due to BLE weakness  Ambulation/Gait Ambulation/Gait assistance: Max assist Gait Distance (Feet): 2 Feet Assistive device: Rolling walker (2 wheeled) Gait Pattern/deviations: Decreased step length - right;Decreased step length - left;Decreased stride length Gait velocity: slow   General Gait Details: limited to 3-4 unsteady labored steps at bedside during transfer to chair due to BLE weakness with poor standing balance   Stairs             Wheelchair Mobility    Modified Rankin (Stroke Patients Only)       Balance Overall balance assessment: Needs assistance Sitting-balance support: Feet supported;No upper extremity supported Sitting balance-Leahy Scale: Fair Sitting balance - Comments: fair/good   Standing balance support: Bilateral upper extremity supported;During functional activity Standing balance-Leahy Scale: Poor Standing balance comment: fair/poor using RW                            Cognition Arousal/Alertness: Awake/alert Behavior During Therapy: WFL for tasks assessed/performed Overall Cognitive Status: History of cognitive impairments - at baseline  General Comments: requires repeated verbal/tactile due to [facility]      Exercises      General Comments        Pertinent Vitals/Pain Pain Assessment: No/denies pain    Home Living Family/patient expects to be discharged to:: Private residence Living Arrangements: Children Available Help at Discharge:  Family Type of Home: House     Home Layout: Multi-level;Able to live on main level with bedroom/bathroom Home Equipment: Dan Humphreys - 2 wheels;Wheelchair - manual Additional Comments: Patient is poor historian, info obtained previous admission notes    Prior Function Level of Independence: Needs assistance  Gait / Transfers Assistance Needed: household ambulator with RW ADL's / Homemaking Assistance Needed: assisted by family     PT Goals (current goals can now be found in the care plan section) Acute Rehab PT Goals Patient Stated Goal: return home PT Goal Formulation: With patient Time For Goal Achievement: 02/08/18 Potential to Achieve Goals: Good Progress towards PT goals: Progressing toward goals    Frequency    Min 3X/week      PT Plan Current plan remains appropriate    Co-evaluation              AM-PAC PT "6 Clicks" Mobility   Outcome Measure  Help needed turning from your back to your side while in a flat bed without using bedrails?: A Lot Help needed moving from lying on your back to sitting on the side of a flat bed without using bedrails?: A Lot Help needed moving to and from a bed to a chair (including a wheelchair)?: A Lot Help needed standing up from a chair using your arms (e.g., wheelchair or bedside chair)?: A Lot Help needed to walk in hospital room?: Total Help needed climbing 3-5 steps with a railing? : Total 6 Click Score: 10    End of Session Equipment Utilized During Treatment: Gait belt Activity Tolerance: Patient tolerated treatment well;Patient limited by fatigue Patient left: in chair;with call bell/phone within reach;with chair alarm set Nurse Communication: Mobility status PT Visit Diagnosis: Unsteadiness on feet (R26.81);Other abnormalities of gait and mobility (R26.89);Muscle weakness (generalized) (M62.81)     Time: 8416-6063 PT Time Calculation (min) (ACUTE ONLY): 26 min  Charges:  $Therapeutic Exercise: 8-22 mins $Therapeutic  Activity: 8-22 mins                     11:56 AM, 01/27/18 Ocie Bob, MPT Physical Therapist with [facility], [place] [phone] office [phone] mobile phone

## 2018-01-27 NOTE — Progress Notes (Signed)
Physical Therapy Treatment Patient Details Name: Jennifer BeetsVelma M [name] MRN: [id number] DOB: Apr 25, 1922 Today's Date: 01/27/2018    History of Present Illness Jennifer [name] is a 85 y.o. female with medical history significant for Dementia, COPD, HLD, CAD, osteoarthritis, spinal stenosis who presents to the ED with altered mental status.  Patient is unable to provide history due to AMS/dementia therefore entirety of history is obtained from daughter, EDP, and chart review.  Per daughter, patient has had 4 falls in the last 7 days.  She was seen in the ED yesterday without obvious acute issue on work-up and discharged home.  She had significant change in mental status this morning per daughter with inability to stand on her own (normally ambulates with use of walker) or communicate/recognize family members.  Normally she is able to recognize family and converse with simple questions.  Daughter states that patient had a large mucoid malodorous episode of diarrhea the evening of 01/22/2018.  Daughter has been treating with Imodium, therefore the patient has not had further frequent diarrhea.       PT Comments    Patient presents in PM slightly confused with perservering on touching gown and trying to take off gait belt requiring constant verbal/tactile cueing to refocus on functional tasks.  Patient initially resisted movement when attempting sit to stands, but became compliant with another person in room and able to take a few unsteady labored steps to transfer back to bed.  Patient left to be cleaned by nursing staff in room.  Patient will benefit from continued physical therapy in hospital and recommended venue below to increase strength, balance, endurance for safe ADLs and gait.   Follow Up Recommendations  SNF     Equipment Recommendations  None recommended by PT    Recommendations for Other Services       Precautions / Restrictions Precautions Precautions: Fall Restrictions Weight Bearing  Restrictions: No    Mobility  Bed Mobility Overal bed mobility: Needs Assistance Bed Mobility: Sit to Supine     Supine to sit: Min assist Sit to supine: Mod assist;Max assist   General bed mobility comments: required assistance to move BLE back onto bed  Transfers Overall transfer level: Needs assistance Equipment used: Rolling walker (2 wheeled) Transfers: Sit to/from [employer];Stand Pivot Transfers Sit to Stand: Mod assist;Max assist Stand pivot transfers: Max assist       General transfer comment: fatigued after sitting up to for greater than 3 hours  Ambulation/Gait Ambulation/Gait assistance: Max assist Gait Distance (Feet): 2 Feet Assistive device: Rolling walker (2 wheeled) Gait Pattern/deviations: Decreased step length - right;Decreased step length - left;Decreased stride length Gait velocity: slow   General Gait Details: limited to 3-4 unsteady labored steps at bedside during transfer to back to bed due to BLE weakness with poor standing balance   Stairs             Wheelchair Mobility    Modified Rankin (Stroke Patients Only)       Balance Overall balance assessment: Needs assistance Sitting-balance support: Feet supported;No upper extremity supported Sitting balance-Leahy Scale: Fair Sitting balance - Comments: fair/good   Standing balance support: Bilateral upper extremity supported;During functional activity Standing balance-Leahy Scale: Poor Standing balance comment: using RW                            Cognition Arousal/Alertness: Awake/alert Behavior During Therapy: Restless;WFL for tasks assessed/performed Overall Cognitive Status: History of cognitive impairments -  at baseline                                 General Comments: requires repeated verbal/tactile [facility]      Exercises      General Comments        Pertinent Vitals/Pain Pain Assessment: No/denies pain    Home Living                       Prior Function            PT Goals (current goals can now be found in the care plan section) Acute Rehab PT Goals Patient Stated Goal: return home PT Goal Formulation: With patient Time For Goal Achievement: 02/08/18 Potential to Achieve Goals: Good Progress towards PT goals: Progressing toward goals    Frequency    Min 3X/week      PT Plan Current plan remains appropriate    Co-evaluation              AM-PAC PT "6 Clicks" Mobility   Outcome Measure  Help needed turning from your back to your side while in a flat bed without using bedrails?: A Lot Help needed moving from lying on your back to sitting on the side of a flat bed without using bedrails?: A Lot Help needed moving to and from a bed to a chair (including a wheelchair)?: A Lot Help needed standing up from a chair using your arms (e.g., wheelchair or bedside chair)?: A Lot Help needed to walk in hospital room?: Total Help needed climbing 3-5 steps with a railing? : Total 6 Click Score: 10    End of Session Equipment Utilized During Treatment: Gait belt Activity Tolerance: Patient tolerated treatment well;Patient limited by fatigue Patient left: in bed;with call bell/phone within reach;with nursing/sitter in room Nurse Communication: Mobility status PT Visit Diagnosis: Unsteadiness on feet (R26.81);Other abnormalities of gait and mobility (R26.89);Muscle weakness (generalized) (M62.81)     Time: [id number] PT Time Calculation (min) (ACUTE ONLY): 25 min  Charges:  $Therapeutic Exercise: 8-22 mins $Therapeutic Activity: 23-37 mins                     3:32 PM, 01/27/18 Ocie BobJames Juaquina Machnik, MPT Physical Therapist [facility] [phone] office [phone] mobile phone

## 2018-01-27 NOTE — Progress Notes (Addendum)
Patient's IV infiltrated while half way  receiving IV  Azithromycin. Daughter refused to let this RN put another IV, stated her mom is going through a whole lot with sticking and needle draws. Dr Robb Matar notified with a new order for oral azithromycin, Zofran and pepcid. Will implement orders and continue to monitor.

## 2018-01-28 DIAGNOSIS — Z7189 Other specified counseling: Secondary | ICD-10-CM | POA: Diagnosis not present

## 2018-01-28 DIAGNOSIS — Z515 Encounter for palliative care: Secondary | ICD-10-CM | POA: Diagnosis not present

## 2018-01-28 DIAGNOSIS — I2510 Atherosclerotic heart disease of native coronary artery without angina pectoris: Secondary | ICD-10-CM | POA: Diagnosis not present

## 2018-01-28 DIAGNOSIS — G9341 Metabolic encephalopathy: Secondary | ICD-10-CM | POA: Diagnosis not present

## 2018-01-28 DIAGNOSIS — R41841 Cognitive communication deficit: Secondary | ICD-10-CM | POA: Diagnosis not present

## 2018-01-28 DIAGNOSIS — F0391 Unspecified dementia with behavioral disturbance: Secondary | ICD-10-CM | POA: Diagnosis not present

## 2018-01-28 DIAGNOSIS — N390 Urinary tract infection, site not specified: Secondary | ICD-10-CM | POA: Diagnosis not present

## 2018-01-28 DIAGNOSIS — E785 Hyperlipidemia, unspecified: Secondary | ICD-10-CM | POA: Diagnosis not present

## 2018-01-28 DIAGNOSIS — R1312 Dysphagia, oropharyngeal phase: Secondary | ICD-10-CM | POA: Diagnosis not present

## 2018-01-28 DIAGNOSIS — M6281 Muscle weakness (generalized): Secondary | ICD-10-CM | POA: Diagnosis not present

## 2018-01-28 DIAGNOSIS — J449 Chronic obstructive pulmonary disease, unspecified: Secondary | ICD-10-CM | POA: Diagnosis not present

## 2018-01-28 DIAGNOSIS — J189 Pneumonia, unspecified organism: Secondary | ICD-10-CM | POA: Diagnosis not present

## 2018-01-28 DIAGNOSIS — R4182 Altered mental status, unspecified: Secondary | ICD-10-CM | POA: Diagnosis not present

## 2018-01-28 DIAGNOSIS — M4800 Spinal stenosis, site unspecified: Secondary | ICD-10-CM | POA: Diagnosis not present

## 2018-01-28 DIAGNOSIS — F0281 Dementia in other diseases classified elsewhere with behavioral disturbance: Secondary | ICD-10-CM | POA: Diagnosis not present

## 2018-01-28 DIAGNOSIS — Z9181 History of falling: Secondary | ICD-10-CM | POA: Diagnosis not present

## 2018-01-28 DIAGNOSIS — E876 Hypokalemia: Secondary | ICD-10-CM | POA: Diagnosis not present

## 2018-01-28 DIAGNOSIS — G301 Alzheimer's disease with late onset: Secondary | ICD-10-CM | POA: Diagnosis not present

## 2018-01-28 DIAGNOSIS — R262 Difficulty in walking, not elsewhere classified: Secondary | ICD-10-CM | POA: Diagnosis not present

## 2018-01-28 DIAGNOSIS — R0902 Hypoxemia: Secondary | ICD-10-CM | POA: Diagnosis not present

## 2018-01-28 DIAGNOSIS — Z7401 Bed confinement status: Secondary | ICD-10-CM | POA: Diagnosis not present

## 2018-01-28 DIAGNOSIS — M1990 Unspecified osteoarthritis, unspecified site: Secondary | ICD-10-CM | POA: Diagnosis not present

## 2018-01-28 LAB — CBC
HCT: 38.6 % (ref 36.0–46.0)
Hemoglobin: 11.7 g/dL — ABNORMAL LOW (ref 12.0–15.0)
MCH: 29.4 pg (ref 26.0–34.0)
MCHC: 30.3 g/dL (ref 30.0–36.0)
MCV: 97 fL (ref 80.0–100.0)
NRBC: 0 % (ref 0.0–0.2)
Platelets: 176 10*3/uL (ref 150–400)
RBC: 3.98 MIL/uL (ref 3.87–5.11)
RDW: 14.1 % (ref 11.5–15.5)
WBC: 8.7 10*3/uL (ref 4.0–10.5)

## 2018-01-28 MED ORDER — GABAPENTIN 100 MG PO CAPS: 300.0000 mg | ORAL_CAPSULE | Freq: Every day | ORAL | 0 refills | Status: DC

## 2018-01-28 MED ORDER — AZITHROMYCIN 250 MG PO TABS: 250.0000 mg | ORAL_TABLET | Freq: Every day | ORAL | 0 refills | Status: AC

## 2018-01-28 MED ORDER — LORAZEPAM 2 MG/ML IJ SOLN: 0.5000 mg | Freq: Once | INTRAMUSCULAR | Status: DC

## 2018-01-28 MED ORDER — ALPRAZOLAM 0.5 MG PO TABS
0.5000 mg | ORAL_TABLET | Freq: Once | ORAL | Status: AC
  Administered 2018-01-28: 0.5 mg via ORAL
  Filled 2018-01-28: qty 1

## 2018-01-28 MED ORDER — LIDOCAINE 5 % EX PTCH: 1.0000 | MEDICATED_PATCH | CUTANEOUS | 0 refills | Status: DC

## 2018-01-28 MED ORDER — CEFDINIR 300 MG PO CAPS: 300.0000 mg | ORAL_CAPSULE | Freq: Two times a day (BID) | ORAL | 0 refills | Status: AC

## 2018-01-28 MED ORDER — FLUTICASONE PROPIONATE 50 MCG/ACT NA SUSP: 1.0000 | Freq: Every day | NASAL | 0 refills | Status: DC

## 2018-01-28 MED ORDER — ARTIFICIAL TEARS OPHTHALMIC OINT: TOPICAL_OINTMENT | Freq: Four times a day (QID) | OPHTHALMIC | 0 refills | Status: AC

## 2018-01-28 NOTE — Consult Note (Addendum)
Consultation Note Date: 01/28/2018   Patient Name: Jennifer [name]  DOB: 08/17/1922  MRN: [id number]  Age / Sex: 85 y.o., female  PCP: Fayrene Helper, MD Referring Physician: Rodena Goldmann, DO  Reason for Consultation: Establishing goals of care  HPI/Patient Profile: 85 y.o. female  with past medical history of dementia, COPD, osteo-arthritis, and spinal stenosis admitted on 01/24/2018 with UTI and pneumonia.  She was also found to have a set small subarachnoid bleed which did not require any surgical intervention.  Palliative medicine consulted for goals of care and at the request of patient's daughter to discuss hospice versus nursing facility.  Clinical Assessment and Goals of Care:  I have reviewed medical records including EPIC notes, labs and imaging,, assessed the patient and then met at the bedside along with [facility], patient's daughter to discuss diagnosis prognosis, GOC, EOL wishes, disposition and options.  Patient in bed- arouses easily to voice. She answers all questions with "God is Good". Her daughter feeds her bites of lunch- but patient does not appear to be chewing and swallowing.   I introduced Palliative Medicine as specialized medical care for people living with serious illness. It focuses on providing relief from the symptoms and stress of a serious illness. The goal is to improve quality of life for both the patient and the family.  As far as functional and nutritional status prior to this admission patient required some assistance for ADLs, able to ambulate in the home, able to feed herself, and could dress herself.  However, her daughter had noticed that she had started to decline in the amounts that she was eating, and she had begun to experience several falls.  We discussed her current illness and what it means in the larger context of her on-going co-morbidities.  Natural  disease trajectory and expectations at EOL were discussed.  We discussed the effect that acute infections has on dementia and overall functional status.  Discussed that patient may not be able to return to the functional status that she was prior to this infection and admission.  Daughter is hopeful that patient can resume some of her functional status and be able to return home.  I attempted to elicit values and goals of care important to the patient.  Gwendolyn notes that patient states that she has had a good life and that she is done living this life.  She states that patient will sometimes lay in her bed and ask out why she has not taken her yet.  The difference between aggressive medical intervention and comfort care was explained considered in light of the patient's goals of care.   Advanced directives, concepts specific to code status, artifical feeding and hydration, and rehospitalization were considered and discussed.  Patient has current DNR status.  Gwendolyn would not want patient to have artificial feeding.  She is uncertain about recurrent hospitalization.  She notes that she may possibly consider a transition to comfort only measures after patient discharges from rehab.  She is concerned that patient  will not improve at rehab.  We discussed that hospice services at home would be very beneficial for support if comfort measures only were to become the goals of care.  Hospice and Palliative Care services outpatient were explained and offered.  Questions and concerns were addressed.  Hard Choices booklet left for review. The family was encouraged to call with questions or concerns.   Primary Decision Maker NEXT OF KIN- daughterNicki Reaper    SUMMARY OF RECOMMENDATIONS -D/C to rehab -Patient's daughter considering Hospice in the home after patient is discharged from rehab -Recommend outpatient Palliative referral- apparantly- [facility] - primary care provider at facility would need to make referral for services -SLP eval at rehab may be helpful- patient does not appear to be chewing and swallowing food- some holding of food noted while she was being fed  Code Status/Advance Care Planning:  DNR   Palliative Prophylaxis:   Aspiration and Frequent Pain Assessment  Additional Recommendations (Limitations, Scope, Preferences):  Full Scope Treatment  Prognosis:    Unable to determine  Discharge Planning: [place] for rehab with Palliative care service follow-up  Primary Diagnoses: Present on Admission: . Altered mental status . COPD (chronic obstructive pulmonary disease) ([place]) . Dementia with behavioral disturbance ([place])   I have reviewed the medical record, interviewed the patient and family, and examined the patient. The following aspects are pertinent.  Past Medical History:  Diagnosis Date  . Anemia    iron deficiency post op.  [place] CAD (coronary artery disease) 2008   Cath 40% mid LAD, 35% RCA  . Cellulitis of finger    right   . Chest pain 2006  . Chronic back pain   . COPD (chronic obstructive pulmonary disease) ([place])   . Gastroesophageal reflux disease   . Hiatal hernia   . Hyperlipidemia   . Nicotine addiction   . Osteoarthritis    status post left TKA; surgery on the right is anticipated in the near future   . Pulmonary nodules    stable since 2006  . Shingles    right breast   . Skin infection   . Ulcer    gential  . Use of cane as ambulatory aid    Social History   Socioeconomic History  . Marital status: Widowed    Spouse name: Not on file  . Number of children: 2  . Years of education: 12th grade  . Highest education level: Not on file  Occupational History  . Occupation: retired from [employer] in [place]  . Financial resource strain: Not on file  . Food insecurity:    Worry: Not on file    Inability: Not on file  . Transportation  needs:    Medical: Not on file    Non-medical: Not on file  Tobacco Use  . Smoking status: Former Smoker    Packs/day: 0.25    Years: 70.00    Pack years: 17.50    Types: Cigarettes    Last attempt to quit: 03/13/2015    Years since quitting: 2.8  . Smokeless tobacco: Never Used  Substance and Sexual Activity  . Alcohol use: No    Alcohol/week: 0.0 standard drinks  . Drug use: No  . Sexual activity: Never    Birth control/protection: Surgical  Lifestyle  . Physical activity:    Days per week: Not on file    Minutes per session: Not on file  .  Stress: Not on file  Relationships  . Social connections:    Talks on phone: Not on file    Gets together: Not on file    Attends religious service: Not on file    Active member of club or organization: Not on file    Attends meetings of clubs or organizations: Not on file    Relationship status: Not on file  Other Topics Concern  . Not on file  Social History Narrative  . Not on file   Family History  Problem Relation Age of Onset  . Cancer Mother 82       breast   . Cancer Father 54       prostate  . Hypertension Father   . Hypertension Sister   . Heart disease Sister   . Heart disease Sister   . Heart disease Sister    Scheduled Meds: . artificial tears   Both Eyes QID  . azithromycin  250 mg Oral Daily  . cefdinir  300 mg Oral Q12H  . celecoxib  100 mg Oral Daily  . famotidine  20 mg Oral BID  . feeding supplement  1 Container Oral BID BM  . fluticasone  1 spray Each Nare Daily  . gabapentin  100 mg Oral QHS  . lidocaine  1 patch Transdermal Q24H   Continuous Infusions: PRN Meds:.acetaminophen **OR** acetaminophen, albuterol, ibuprofen, MUSCLE RUB, ondansetron (ZOFRAN) IV Medications Prior to Admission:  Prior to Admission medications   Medication Sig Start Date End Date Taking? Authorizing Provider  acetaminophen (TYLENOL) 500 MG tablet Take 1,000 mg by mouth 2 (two) times daily. *May take an additional dose if  needed for pain   Yes [provider]  celecoxib (CELEBREX) 100 MG capsule TAKE ONE CAPSULE BY MOUTH EVERY DAY Patient taking differently: Take 100 mg by mouth daily.  09/20/17  Yes Fayrene Helper, MD  cetirizine (ZYRTEC) 10 MG tablet TAKE 1 TABLET(10 MG) BY MOUTH DAILY Patient taking differently: Take 10 mg by mouth daily.  11/29/17  Yes Fayrene Helper, MD  gabapentin (NEURONTIN) 300 MG capsule TAKE 1 CAPSULE BY MOUTH EVERY MORNING AND TAKE 2 CAPSULES BY MOUTH EVERY NIGHT AT BEDTIME Patient taking differently: Take 300-600 mg by mouth See admin instructions. TAKE 1 CAPSULE BY MOUTH EVERY MORNING AND TAKE 2 CAPSULES BY MOUTH EVERY NIGHT AT BEDTIME 01/10/18  Yes Fayrene Helper, MD  Glycerin-Hypromellose-PEG 400 (ARTIFICIAL TEARS) 0.2-0.2-1 % SOLN Apply 1-2 drops to eye daily as needed (for dry eye relief).   Yes [provider]  loperamide (IMODIUM) 2 MG capsule Take 2 mg by mouth daily.   Yes [provider]  oxybutynin (DITROPAN-XL) 5 MG 24 hr tablet TAKE 1 TABLET BY MOUTH EVERY NIGHT AT BEDTIME Patient taking differently: Take 5 mg by mouth at bedtime.  01/11/18  Yes Fayrene Helper, MD  artificial tears (LACRILUBE) OINT ophthalmic ointment Place into both eyes 4 (four) times daily for 30 days. 01/28/18 02/27/18  Manuella Ghazi, Pratik D, DO  azithromycin (ZITHROMAX) 250 MG tablet Take 1 tablet (250 mg total) by mouth daily for 3 days. 01/28/18 01/31/18  Manuella Ghazi, Pratik D, DO  cefdinir (OMNICEF) 300 MG capsule Take 1 capsule (300 mg total) by mouth every 12 (twelve) hours for 3 days. 01/28/18 01/31/18  Manuella Ghazi, Pratik D, DO  ciprofloxacin (CIPRO) 500 MG tablet Take 1 tablet (500 mg total) by mouth 2 (two) times daily. Patient not taking: Reported on 01/24/2018 11/13/17   Fayrene Helper, MD  fluticasone (FLONASE) 50 MCG/ACT nasal spray Place 1 spray into both nostrils daily for 30 days. 01/29/18 02/28/18  Manuella Ghazi, Pratik D, DO  gabapentin (NEURONTIN) 100 MG capsule Take 3  capsules (300 mg total) by mouth at bedtime for 30 days. 01/28/18 02/27/18  Manuella Ghazi, Pratik D, DO  lidocaine (LIDODERM) 5 % Place 1 patch onto the skin daily. Remove & Discard patch within 12 hours or as directed by MD 01/29/18   Manuella Ghazi, Pratik D, DO  nitrofurantoin (MACRODANTIN) 100 MG capsule Take 1 capsule (100 mg total) by mouth 2 (two) times daily. Patient not taking: Reported on 01/24/2018 11/16/17   Fayrene Helper, MD   Allergies  Allergen Reactions  . Aricept [Donepezil Hcl] Other (See Comments)    confusion  . Iohexol      Desc: PT ALLERGIC TO CONTRAST- SHE CAN'T BREATH   . Iron   . Penicillins     Has patient had a PCN reaction causing immediate rash, facial/tongue/throat swelling, SOB or lightheadedness with hypotension:  unknown Has patient had a PCN reaction causing severe rash involving mucus membranes or skin necrosis: unknown Has patient had a PCN reaction that required hospitalization unknown Has patient had a PCN reaction occurring within the last 10 years: unknown If all of the above answers are "NO", then may proceed with Cephalosporin use.    Review of Systems  Unable to perform ROS: Dementia    Physical Exam Vitals signs and nursing note reviewed.  Eyes:     Comments: Bilateral injected conjuctiva  Cardiovascular:     Rate and Rhythm: Normal rate.  Pulmonary:     Effort: Pulmonary effort is normal.  Skin:    General: Skin is warm and dry.  Neurological:     Comments: Only verbal response to questions- "God is good"  Psychiatric:     Comments: Flat affect     Vital Signs: BP (!) 150/83   Pulse 84   Temp 98.5 F (36.9 C) (Oral)   Resp 18   Ht 5' 5"  (1.651 m)   Wt 91.9 kg   SpO2 92%   BMI 33.71 kg/m  Pain Scale: 0-10   Pain Score: 0-No pain   SpO2: SpO2: 92 % O2 Device:SpO2: 92 % O2 Flow Rate: .   IO: Intake/output summary:   Intake/Output Summary (Last 24 hours) at 01/28/2018 1314 Last data filed at 01/28/2018 0552 Gross per 24 hour    Intake 240 ml  Output 750 ml  Net -510 ml    LBM: Last BM Date: 01/22/18 Baseline Weight: Weight: 91.9 kg Most recent weight: Weight: 91.9 kg     Palliative Assessment/Data: PPS: 30%     Thank you for this consult. Palliative medicine will continue to follow and assist as needed.   Time In:1200 Time Out: 1315 Time Total: 75 minutes Greater than 50%  of this time was spent counseling and coordinating care related to the above assessment and plan.  Signed by: Mariana Kaufman, AGNP-C Palliative Medicine    Please contact Palliative Medicine Team phone at [phone] for questions and concerns.  For individual provider: See Shea Evans

## 2018-01-28 NOTE — Discharge Summary (Signed)
Physician Discharge Summary  Jennifer [name] GEX:[id number] DOB: 03-May-1922 DOA: 01/24/2018  PCP: Kerri PerchesSimpson, Jennifer E, MD  Admit date: 01/24/2018  Discharge date: 01/28/2018  Admitted From:Home  Disposition:  SNF  Recommendations for Outpatient Follow-up:  1. Follow up with PCP in 1-2 weeks 2. Continue on azithromycin and Omnicef as prescribed for 3 more days to complete 7-day course of treatment of pneumonia and UTI 3. Mentation has further improved this morning and will increase gabapentin dose to 300 mg at bedtime.  Please note patient used to take gabapentin 300-600 mg twice daily and may be titrated up based on tolerance.  Home Health: None  Equipment/Devices: None  Discharge Condition: Stable  CODE STATUS: DNR  Diet recommendation: Heart Healthy  Brief/Interim Summary: Per HPI: 85 y.o.femalewith medical history significant forDementia, COPD, HLD, CAD,osteoarthritis, spinal stenosis who presents to the ED with altered mental status. Patient is unable to provide history due to AMS/dementia therefore entirety of history is obtained from daughter, EDP, and chart review.  Per daughter, patient has had 4 falls in the last 7 days. She was seen in the ED yesterday without obvious acute issue on work-up and discharged home. She had significant change in mental status this morning per daughter with inability to stand on her own (normally ambulates with use of walker) or communicate/recognize family members. Normally she is able to recognize family and converse with simple questions. Daughter states that patient had a large mucoid malodorous episode of diarrhea the evening of 01/22/2018. Daughter has been treating with Imodium, therefore the patient has not had further frequent diarrhea.   Daughter states that patient has frequent UTIs and was last treated in early November, initially with ciprofloxacin which was switched to nitrofurantoin based on culture sensitivities. She has  not had antibiotics since then. Daughter also states the patient has had recent poor appetite.  Patient was admitted with acute encephalopathy initially suspected secondary to E. coli UTI as well as pneumonia, but this was also noted to be complicated with polypharmacy.  She had had some periods of agitation during her hospitalization requiring doses of Ativan which appears to have had a significant impact on her sedation as well as combined use of sedating agents to include Zyrtec, Seroquel, and gabapentin.  Patient continued to receive treatment with IV antibiotics and she was gradually switched to oral antibiotics with azithromycin and Omnicef which she is tolerating well.  Her sedating medications were removed for approximately 24 hours and she had noted significant improvement.  During that time further work-up revealed a small subarachnoid bleed on her head CT of no clinical significance as discussed with neurosurgery.  Additionally, ammonia levels as well as TSH were found to be within normal limits.  She has had no other acute events during the course of this hospitalization and has returned back to her baseline level of functioning.  She is to continue on Omnicef as well as azithromycin for 3 more days to complete a 7-day course of treatment and will require inpatient rehabilitation based on PT evaluation during this hospitalization.  Discharge Diagnoses:  Principal Problem:   Altered mental status Active Problems:   COPD (chronic obstructive pulmonary disease) (HCC)   Dementia with behavioral disturbance (HCC)   Frequent falls   Hypokalemia  Principal discharge diagnosis: Acute metabolic encephalopathy secondary to pneumonia and E. coli UTI along with polypharmacy.  Discharge Instructions  Discharge Instructions    Diet - low sodium heart healthy   Complete by:  As directed  Increase activity slowly   Complete by:  As directed      Allergies as of 01/28/2018      Reactions    Aricept [donepezil Hcl] Other (See Comments)   confusion   Iohexol     Desc: PT ALLERGIC TO CONTRAST- SHE CAN'T BREATH   Iron    Penicillins    Has patient had a PCN reaction causing immediate rash, facial/tongue/throat swelling, SOB or lightheadedness with hypotension:  unknown Has patient had a PCN reaction causing severe rash involving mucus membranes or skin necrosis: unknown Has patient had a PCN reaction that required hospitalization unknown Has patient had a PCN reaction occurring within the last 10 years: unknown If all of the above answers are "NO", then may proceed with Cephalosporin use.      Medication List    STOP taking these medications   ARTIFICIAL TEARS 0.2-0.2-1 % Soln Generic drug:  Glycerin-Hypromellose-PEG 400   cetirizine 10 MG tablet Commonly known as:  ZYRTEC   ciprofloxacin 500 MG tablet Commonly known as:  CIPRO   nitrofurantoin 100 MG capsule Commonly known as:  MACRODANTIN     TAKE these medications   acetaminophen 500 MG tablet Commonly known as:  TYLENOL Take 1,000 mg by mouth 2 (two) times daily. *May take an additional dose if needed for pain   artificial tears Oint ophthalmic ointment Commonly known as:  LACRILUBE Place into both eyes 4 (four) times daily for 30 days.   azithromycin 250 MG tablet Commonly known as:  ZITHROMAX Take 1 tablet (250 mg total) by mouth daily for 3 days.   cefdinir 300 MG capsule Commonly known as:  OMNICEF Take 1 capsule (300 mg total) by mouth every 12 (twelve) hours for 3 days.   celecoxib 100 MG capsule Commonly known as:  CELEBREX TAKE ONE CAPSULE BY MOUTH EVERY DAY   fluticasone 50 MCG/ACT nasal spray Commonly known as:  FLONASE Place 1 spray into both nostrils daily for 30 days. Start taking on:  January 29, 2018   gabapentin 100 MG capsule Commonly known as:  NEURONTIN Take 3 capsules (300 mg total) by mouth at bedtime for 30 days. What changed:    medication strength  See the new  instructions.   lidocaine 5 % Commonly known as:  LIDODERM Place 1 patch onto the skin daily. Remove & Discard patch within 12 hours or as directed by MD Start taking on:  January 29, 2018   loperamide 2 MG capsule Commonly known as:  IMODIUM Take 2 mg by mouth daily.   oxybutynin 5 MG 24 hr tablet Commonly known as:  DITROPAN-XL TAKE 1 TABLET BY MOUTH EVERY NIGHT AT BEDTIME      Follow-up Information    [name], MD Follow up in 2 week(s).   Specialty:  Family Medicine Contact information: [address] [place] [address] [phone]          Allergies  Allergen Reactions  . Aricept [Donepezil Hcl] Other (See Comments)    confusion  . Iohexol      Desc: PT ALLERGIC TO CONTRAST- SHE CAN'T BREATH   . Iron   . Penicillins     Has patient had a PCN reaction causing immediate rash, facial/tongue/throat swelling, SOB or lightheadedness with hypotension:  unknown Has patient had a PCN reaction causing severe rash involving mucus membranes or skin necrosis: unknown Has patient had a PCN reaction that required hospitalization unknown Has patient had a PCN reaction occurring  within the last 10 years: unknown If all of the above answers are "NO", then may proceed with Cephalosporin use.     Consultations:  None   Procedures/Studies: Ct Abdomen Pelvis Wo Contrast  Result Date: 01/24/2018 CLINICAL DATA:  Fever, cough, confusion, abdominal pain EXAM: CT ABDOMEN AND PELVIS WITHOUT CONTRAST TECHNIQUE: Multidetector CT imaging of the abdomen and pelvis was performed following the standard protocol without IV contrast. COMPARISON:  10/20/2015 FINDINGS: Limited evaluation due to motion degradation, streak artifact from the patient's arms, and lack of intravenous contrast administration. Lower chest: Mild subpleural patchy opacity in the lateral right middle lobe and right lower lobe. Hepatobiliary: Unenhanced liver is unremarkable. Status post  cholecystectomy. No intrahepatic or extrahepatic ductal dilatation. Pancreas: Fatty parenchymal atrophy. Spleen: Within normal limits. Adrenals/Urinary Tract: Adrenal glands are within normal limits. Right upper pole renal cyst and left renal sinus cysts. No renal calculi or hydronephrosis. Bladder is mildly thick-walled although underdistended. Stomach/Bowel: Stomach is notable for a moderate hiatal hernia. No evidence of bowel obstruction. Appendix is not discretely visualized. Sigmoid diverticulosis, without evidence of diverticulitis. Vascular/Lymphatic: No evidence of abdominal aortic aneurysm. Atherosclerotic calcifications of the abdominal aorta and branch vessels. No suspicious abdominopelvic lymphadenopathy. Reproductive: Status post hysterectomy. No adnexal masses. Other: No abdominopelvic ascites. Musculoskeletal: Degenerative changes of the visualized thoracolumbar spine. IMPRESSION: Limited evaluation, as described above. No evidence of bowel obstruction. Sigmoid diverticulosis, without evidence of diverticulitis. No CT findings to account for the patient's abdominal pain. Mild patchy subpleural opacity in the right middle and lower lobes, nonspecific and poorly evaluated, but suggesting mild infection/pneumonia. Electronically Signed   By: Charline Bills M.D.   On: 01/24/2018 20:23   Dg Chest 2 View  Result Date: 01/24/2018 CLINICAL DATA:  Altered level of consciousness. EXAM: CHEST - 2 VIEW COMPARISON:  Radiographs of January 23, 2018. FINDINGS: Stable cardiomegaly. No pneumothorax or pleural effusion is noted. No acute pulmonary disease is noted. Surgical clips are noted in both axillary regions. Bony thorax is unremarkable. IMPRESSION: No active cardiopulmonary disease. Electronically Signed   By: Lupita Raider, M.D.   On: 01/24/2018 15:27   Dg Chest 2 View  Result Date: 01/23/2018 CLINICAL DATA:  Initial evaluation for acute cough. EXAM: CHEST - 2 VIEW COMPARISON:  Prior radiograph  from 03/23/2016. FINDINGS: Patient is markedly rotated to the right. Allowing for rotation, cardiac and mediastinal silhouettes likely relatively unchanged. Heart size remains within normal limits. Lungs hypoinflated with elevation of the right hemidiaphragm. Diffuse bronchitic changes, compatible with COPD. Mild left basilar atelectasis. No consolidative opacity. No pulmonary edema or pleural effusion. No pneumothorax. No acute osseous abnormality. IMPRESSION: 1. COPD. No superimposed consolidative opacity to suggest pneumonia. 2. Mild left basilar atelectasis. Electronically Signed   By: Rise Mu M.D.   On: 01/23/2018 14:43   Dg Lumbar Spine Complete  Result Date: 01/23/2018 CLINICAL DATA:  Initial evaluation for acute trauma, fall. EXAM: LUMBAR SPINE - COMPLETE 4+ VIEW COMPARISON:  Prior radiograph from 09/16/2017. FINDINGS: 5 non rib-bearing lumbar type vertebral bodies. Vertebral bodies normally aligned with preservation of the normal lumbar lordosis. Trace retrolisthesis of L1 on L2, stable. Vertebral body heights maintained without evidence for acute fracture. Minimal height loss at the superior endplate of T12 stable. Visualized sacrum and pelvis grossly intact. Moderate to advanced multilevel degenerative spondylolysis, most notable L3-4 and L4-5, similar. No acute soft tissue abnormality. Surgical clips noted within the right upper quadrant. Aortic atherosclerosis. IMPRESSION: 1. No radiographic evidence for acute abnormality within the lumbar  spine. 2. Moderate to advanced multilevel degenerative spondylolysis, stable. Electronically Signed   By: Rise Mu M.D.   On: 01/23/2018 14:45   Ct Head Wo Contrast  Result Date: 01/26/2018 CLINICAL DATA:  Altered level of consciousness. EXAM: CT HEAD WITHOUT CONTRAST TECHNIQUE: Contiguous axial images were obtained from the base of the skull through the vertex without intravenous contrast. COMPARISON:  01/24/2018 FINDINGS: Brain:  Linear high-density area noted in the posterior right parietal region concerning for a small amount of subarachnoid hemorrhage. This is new since prior study. There is atrophy and chronic small vessel disease changes. No hydrocephalus. No intraparenchymal hemorrhage or acute infarction. Vascular: No hyperdense vessel or unexpected calcification. Skull: No acute calvarial abnormality. Sinuses/Orbits: Visualized paranasal sinuses and mastoids clear. Orbital soft tissues unremarkable. Other: None IMPRESSION: Findings concerning for small subarachnoid bleed in the right posterior parietal region, new since prior study. Atrophy, chronic small vessel disease. These results will be called to the ordering clinician or representative by the Radiologist Assistant, and communication documented in the PACS or zVision Dashboard. Electronically Signed   By: Charlett Nose M.D.   On: 01/26/2018 12:08   Ct Head Wo Contrast  Result Date: 01/24/2018 CLINICAL DATA:  Recent falls with altered level of consciousness EXAM: CT HEAD WITHOUT CONTRAST TECHNIQUE: Contiguous axial images were obtained from the base of the skull through the vertex without intravenous contrast. COMPARISON:  12/07/2014 FINDINGS: Brain: Diffuse atrophic and chronic white matter ischemic changes are noted. No findings to suggest acute hemorrhage, acute infarction or space-occupying mass lesion are noted. Vascular: No hyperdense vessel or unexpected calcification. Skull: Normal. Negative for fracture or focal lesion. Sinuses/Orbits: No acute finding. Other: None. IMPRESSION: Chronic atrophic and ischemic changes without acute abnormality. Electronically Signed   By: Alcide Clever M.D.   On: 01/24/2018 15:27     Discharge Exam: Vitals:   01/28/18 0545 01/28/18 0738  BP: (!) 150/83   Pulse: 84   Resp: 18   Temp: 98.5 F (36.9 C)   SpO2: 98% 92%   Vitals:   01/27/18 0547 01/27/18 2315 01/28/18 0545 01/28/18 0738  BP: (!) 158/88 (!) 159/104 (!) 150/83    Pulse: 77 100 84   Resp: 18 (!) 21 18   Temp: 99.2 F (37.3 C) 98.1 F (36.7 C) 98.5 F (36.9 C)   TempSrc: Oral Oral Oral   SpO2: 97% 92% 98% 92%  Weight:      Height:        General: Pt is alert, awake, not in acute distress Cardiovascular: RRR, S1/S2 +, no rubs, no gallops Respiratory: CTA bilaterally, no wheezing, no rhonchi Abdominal: Soft, NT, ND, bowel sounds + Extremities: no edema, no cyanosis    The results of significant diagnostics from this hospitalization (including imaging, microbiology, ancillary and laboratory) are listed below for reference.     Microbiology: Recent Results (from the past 240 hour(s))  Urine culture     Status: Abnormal   Collection Time: 01/23/18  3:51 PM  Result Value Ref Range Status   Specimen Description   Final    URINE, CATHETERIZED Performed at [facility], [address], [place], [place] [address]    Special Requests   Final    Normal Performed at [facility], [address] [place], [place] [address]    Culture 50,000 COLONIES/mL ESCHERICHIA COLI (A)  Final   Report Status 01/25/2018 FINAL  Final   Organism ID, Bacteria ESCHERICHIA COLI (A)  Final      Susceptibility  Escherichia coli - MIC*    AMPICILLIN >=32 RESISTANT Resistant     CEFAZOLIN <=4 SENSITIVE Sensitive     CEFTRIAXONE <=1 SENSITIVE Sensitive     CIPROFLOXACIN >=4 RESISTANT Resistant     GENTAMICIN <=1 SENSITIVE Sensitive     IMIPENEM <=0.25 SENSITIVE Sensitive     NITROFURANTOIN <=16 SENSITIVE Sensitive     TRIMETH/SULFA <=20 SENSITIVE Sensitive     AMPICILLIN/SULBACTAM >=32 RESISTANT Resistant     PIP/TAZO <=4 SENSITIVE Sensitive     Extended ESBL NEGATIVE Sensitive     * 50,000 COLONIES/mL ESCHERICHIA COLI  Urine culture     Status: None   Collection Time: 01/24/18  1:50 PM  Result Value Ref Range Status   Specimen Description   Final    URINE, CATHETERIZED Performed at [facility], [address], [place], [place] [address]    Special  Requests   Final    Normal Performed at [facility], [address], [place], [place] [address]    Culture   Final    Multiple bacterial morphotypes present, none predominant. Suggest appropriate recollection if clinically indicated.   Report Status 01/26/2018 FINAL  Final  Culture, blood (routine x 2)     Status: None (Preliminary result)   Collection Time: 01/24/18  2:30 PM  Result Value Ref Range Status   Specimen Description RIGHT ANTECUBITAL  Final   Special Requests   Final    BOTTLES DRAWN AEROBIC AND ANAEROBIC Blood Culture adequate volume   Culture   Final    NO GROWTH 4 DAYS Performed at [facility], [address] [place], [place] [address]    Report Status PENDING  Incomplete  Culture, blood (routine x 2)     Status: None (Preliminary result)   Collection Time: 01/24/18  2:40 PM  Result Value Ref Range Status   Specimen Description BLOOD LEFT HAND  Final   Special Requests   Final    BOTTLES DRAWN AEROBIC ONLY Blood Culture results may not be optimal due to an inadequate volume of blood received in culture bottles   Culture   Final    NO GROWTH 4 DAYS Performed at [facility], [address] [place], [place] [address]    Report Status PENDING  Incomplete  Culture, blood (routine x 2)     Status: None (Preliminary result)   Collection Time: 01/26/18  5:10 PM  Result Value Ref Range Status   Specimen Description LEFT ANTECUBITAL  Final   Special Requests   Final    BOTTLES DRAWN AEROBIC AND ANAEROBIC Blood Culture adequate volume   Culture   Final    NO GROWTH 2 DAYS Performed at [facility], [address] [place], [place] [address]    Report Status PENDING  Incomplete  Culture, blood (routine x 2)     Status: None (Preliminary result)   Collection Time: 01/26/18  5:17 PM  Result Value Ref Range Status   Specimen Description BLOOD LEFT HAND  Final   Special Requests   Final    BOTTLES DRAWN AEROBIC AND ANAEROBIC Blood Culture adequate volume   Culture    Final    NO GROWTH 2 DAYS Performed at [facility], [address], [place], [place] [address]    Report Status PENDING  Incomplete     Labs: BNP (last 3 results) Recent Labs    01/24/18 1440  BNP 249.0*   Basic Metabolic Panel: Recent Labs  Lab 01/23/18 1322 01/24/18 1444 01/25/18 0540 01/26/18 0550  01/27/18 1000  NA 144 138 139 140 140  K 3.6 3.2* 3.9 4.4 4.1  CL 109 105 106 110 109  CO2 28 24 24 23  21*  GLUCOSE 93 110* 127* 113* 91  BUN 16 13 11 14 12   CREATININE 0.86 0.86 0.71 0.90 0.74  CALCIUM 8.4* 7.7* 8.1* 8.0* 8.2*  MG  --  1.8  --   --   --    Liver Function Tests: Recent Labs  Lab 01/23/18 1322 01/24/18 1444  AST 16 20  ALT 12 13  ALKPHOS 93 98  BILITOT 0.7 0.9  PROT 6.7 6.6  ALBUMIN 3.4* 3.1*   Recent Labs  Lab 01/23/18 1322  LIPASE 17   Recent Labs  Lab 01/26/18 1311  AMMONIA <9*   CBC: Recent Labs  Lab 01/23/18 1322 01/24/18 1444 01/25/18 0540 01/26/18 0550 01/27/18 1253 01/28/18 0550  WBC 8.2 14.7* 13.4* 11.1* 11.1* 8.7  NEUTROABS 5.8 12.5*  --   --   --   --   HGB 12.7 11.7* 12.8 11.8* 12.7 11.7*  HCT 43.2 41.5 42.9 39.4 43.0 38.6  MCV 100.5* 103.0* 100.2* 96.8 98.9 97.0  PLT 156 137* 143* 155 167 176   Cardiac Enzymes: Recent Labs  Lab 01/23/18 1322 01/24/18 1444  TROPONINI <0.03 0.03*   BNP: Invalid input(s): POCBNP CBG: No results for input(s): GLUCAP in the last 168 hours. D-Dimer No results for input(s): DDIMER in the last 72 hours. Hgb A1c No results for input(s): HGBA1C in the last 72 hours. Lipid Profile No results for input(s): CHOL, HDL, LDLCALC, TRIG, CHOLHDL, LDLDIRECT in the last 72 hours. Thyroid function studies Recent Labs    01/26/18 1311  TSH 1.581   Anemia work up No results for input(s): VITAMINB12, FOLATE, FERRITIN, TIBC, IRON, RETICCTPCT in the last 72 hours. Urinalysis    Component Value Date/Time   COLORURINE AMBER (A) 01/24/2018 1350   APPEARANCEUR CLOUDY (A) 01/24/2018 1350    LABSPEC 1.024 01/24/2018 1350   PHURINE 5.0 01/24/2018 1350   GLUCOSEU NEGATIVE 01/24/2018 1350   GLUCOSEU NEG mg/dL 16/10/960401/17/2008 [id number]   HGBUR MODERATE (A) 01/24/2018 1350   HGBUR negative 05/02/2008 1547   BILIRUBINUR NEGATIVE 01/24/2018 1350   BILIRUBINUR small 12/05/2012 1324   KETONESUR 20 (A) 01/24/2018 1350   PROTEINUR 100 (A) 01/24/2018 1350   UROBILINOGEN 1.0 12/05/2012 1324   UROBILINOGEN 0.2 05/02/2008 1547   NITRITE NEGATIVE 01/24/2018 1350   LEUKOCYTESUR NEGATIVE 01/24/2018 1350   Sepsis Labs Invalid input(s): PROCALCITONIN,  WBC,  LACTICIDVEN Microbiology Recent Results (from the past 240 hour(s))  Urine culture     Status: Abnormal   Collection Time: 01/23/18  3:51 PM  Result Value Ref Range Status   Specimen Description   Final    URINE, CATHETERIZED Performed at [facility], [address] [place], [place] [address]    Special Requests   Final    Normal Performed at [facility], [address] [place], [place] [address]    Culture 50,000 COLONIES/mL ESCHERICHIA COLI (A)  Final   Report Status 01/25/2018 FINAL  Final   Organism ID, Bacteria ESCHERICHIA COLI (A)  Final      Susceptibility   Escherichia coli - MIC*    AMPICILLIN >=32 RESISTANT Resistant     CEFAZOLIN <=4 SENSITIVE Sensitive     CEFTRIAXONE <=1 SENSITIVE Sensitive     CIPROFLOXACIN >=4 RESISTANT Resistant     GENTAMICIN <=1 SENSITIVE Sensitive     IMIPENEM <=0.25 SENSITIVE Sensitive  NITROFURANTOIN <=16 SENSITIVE Sensitive     TRIMETH/SULFA <=20 SENSITIVE Sensitive     AMPICILLIN/SULBACTAM >=32 RESISTANT Resistant     PIP/TAZO <=4 SENSITIVE Sensitive     Extended ESBL NEGATIVE Sensitive     * 50,000 COLONIES/mL ESCHERICHIA COLI  Urine culture     Status: None   Collection Time: 01/24/18  1:50 PM  Result Value Ref Range Status   Specimen Description   Final    URINE, CATHETERIZED Performed at [facility], [address], [place], [place] [address]    Special Requests   Final     Normal Performed at [facility], [address] [place], [place] [address]    Culture   Final    Multiple bacterial morphotypes present, none predominant. Suggest appropriate recollection if clinically indicated.   Report Status 01/26/2018 FINAL  Final  Culture, blood (routine x 2)     Status: None (Preliminary result)   Collection Time: 01/24/18  2:30 PM  Result Value Ref Range Status   Specimen Description RIGHT ANTECUBITAL  Final   Special Requests   Final    BOTTLES DRAWN AEROBIC AND ANAEROBIC Blood Culture adequate volume   Culture   Final    NO GROWTH 4 DAYS Performed at [facility], [address], [place], [place] [address]    Report Status PENDING  Incomplete  Culture, blood (routine x 2)     Status: None (Preliminary result)   Collection Time: 01/24/18  2:40 PM  Result Value Ref Range Status   Specimen Description BLOOD LEFT HAND  Final   Special Requests   Final    BOTTLES DRAWN AEROBIC ONLY Blood Culture results may not be optimal due to an inadequate volume of blood received in culture bottles   Culture   Final    NO GROWTH 4 DAYS Performed at [facility], [address], [place], [place] [address]    Report Status PENDING  Incomplete  Culture, blood (routine x 2)     Status: None (Preliminary result)   Collection Time: 01/26/18  5:10 PM  Result Value Ref Range Status   Specimen Description LEFT ANTECUBITAL  Final   Special Requests   Final    BOTTLES DRAWN AEROBIC AND ANAEROBIC Blood Culture adequate volume   Culture   Final    NO GROWTH 2 DAYS Performed at [facility], [address] [place], [place] [address]    Report Status PENDING  Incomplete  Culture, blood (routine x 2)     Status: None (Preliminary result)   Collection Time: 01/26/18  5:17 PM  Result Value Ref Range Status   Specimen Description BLOOD LEFT HAND  Final   Special Requests   Final    BOTTLES DRAWN AEROBIC AND ANAEROBIC Blood Culture adequate volume   Culture   Final    NO GROWTH 2  DAYS Performed at [facility], [address] [place], [place] [address]    Report Status PENDING  Incomplete     Time coordinating discharge: 35 minutes  SIGNED:   Erick Blinks, DO [facility] 01/28/2018, 11:51 AM Pager [phone]  If 7PM-7AM, please contact night-coverage [email or web address] Password [email or web address]

## 2018-01-28 NOTE — Clinical Social Work Placement (Signed)
   CLINICAL SOCIAL WORK PLACEMENT  NOTE  Date:  01/28/2018  Patient Details  Name: Jennifer [name] MRN: [id number] Date of Birth: 06-04-1922  Clinical Social Work is seeking post-discharge placement for this patient at the Skilled  Nursing Facility level of care (*CSW will initial, date and re-position this form in  chart as items are completed):  Yes   Patient/family provided with [facility] Clinical Social Work Department's list of facilities offering this level of care within the geographic area requested by the patient (or if unable, by the patient's family).  Yes   Patient/family informed of their freedom to choose among providers that offer the needed level of care, that participate in Medicare, Medicaid or managed care program needed by the patient, have an available bed and are willing to accept the patient.  Yes   Patient/family informed of [facility]'s ownership interest in [facility] and [facility], as well as of the fact that they are under no obligation to receive care at these facilities.  PASRR submitted to EDS on       PASRR number received on       Existing PASRR number confirmed on 01/25/18     FL2 transmitted to all facilities in geographic area requested by pt/family on 01/25/18     FL2 transmitted to all facilities within larger geographic area on       Patient informed that his/her managed care company has contracts with or will negotiate with certain facilities, including the following:        Yes   Patient/family informed of bed offers received.  Patient chooses bed at [facility]     Physician recommends and patient chooses bed at      Patient to be transferred to [facility] on 01/28/18.  Patient to be transferred to facility by [facility]     Patient family notified on 01/28/18 of transfer.  Name of family member notified:  DAUGHTER     PHYSICIAN       Additional Comment:  Discharge clinicals sent.  Department administrative  to call EMS when RN is ready for transport after 3:00p.m. LCSW signing off.   _______________________________________________ Annice Needy, LCSW 01/28/2018, 1:56 PM

## 2018-01-28 NOTE — Care Management Important Message (Signed)
Important Message  Patient Details  Name: Jennifer [name] MRN: [id number] Date of Birth: January 29, 1922   Medicare Important Message Given:  Yes    Renie Ora 01/28/2018, 1:21 PM

## 2018-01-28 NOTE — Progress Notes (Signed)
Physical Therapy Treatment Patient Details Name: Jennifer SIELOFF MRN: [id number] DOB: 08/03/1922 Today's Date: 01/28/2018    History of Present Illness Jennifer [name] is a 85 y.o. female with medical history significant for Dementia, COPD, HLD, CAD, osteoarthritis, spinal stenosis who presents to the ED with altered mental status.  Patient is unable to provide history due to AMS/dementia therefore entirety of history is obtained from daughter, EDP, and chart review.  Per daughter, patient has had 4 falls in the last 7 days.  She was seen in the ED yesterday without obvious acute issue on work-up and discharged home.  She had significant change in mental status this morning per daughter with inability to stand on her own (normally ambulates with use of walker) or communicate/recognize family members.  Normally she is able to recognize family and converse with simple questions.  Daughter states that patient had a large mucoid malodorous episode of diarrhea the evening of 01/22/2018.  Daughter has been treating with Imodium, therefore the patient has not had further frequent diarrhea.       PT Comments    Patient required much verbal/tactile cueing to follow instructions and able to complete most exercises with demonstration, had difficulty with seated hip flexion due to BLE weakness, limited to a few steps at bedside secondary to BLE weakness with buckling of knees and poor standing balance.  Patient tolerated sitting up in chair for approximately 3 hours before put back to bed.  Patient will benefit from continued physical therapy in hospital and recommended venue below to increase strength, balance, endurance for safe ADLs and gait.    Follow Up Recommendations  SNF     Equipment Recommendations  None recommended by PT    Recommendations for Other Services       Precautions / Restrictions Precautions Precautions: Fall Restrictions Weight Bearing Restrictions: No    Mobility  Bed  Mobility Overal bed mobility: Needs Assistance Bed Mobility: Sit to Supine     Supine to sit: Mod assist Sit to supine: Max assist   General bed mobility comments: Requires more assistance once fatigued to get back in bed  Transfers Overall transfer level: Needs assistance Equipment used: Rolling walker (2 wheeled) Transfers: Sit to/from [employer] Sit to Stand: Mod assist Stand pivot transfers: Mod assist;Max assist       General transfer comment: slow labored movement with much verbal/tactile cueing to complete  Ambulation/Gait Ambulation/Gait assistance: Max assist Gait Distance (Feet): 2 Feet Assistive device: Rolling walker (2 wheeled) Gait Pattern/deviations: Decreased step length - right;Decreased step length - left;Decreased stride length Gait velocity: slow   General Gait Details: limited to 3-4 unsteady labored steps with buckling of knees due to weakness and fatigue   Stairs             Wheelchair Mobility    Modified Rankin (Stroke Patients Only)       Balance Overall balance assessment: Needs assistance Sitting-balance support: Feet supported;No upper extremity supported Sitting balance-Leahy Scale: Fair     Standing balance support: Bilateral upper extremity supported;During functional activity Standing balance-Leahy Scale: Poor Standing balance comment: using RW                            Cognition Arousal/Alertness: Awake/alert Behavior During Therapy: WFL for tasks assessed/performed Overall Cognitive Status: Within Functional Limits for tasks assessed  Exercises General Exercises - Lower Extremity Long Arc Quad: Seated;AROM;Strengthening;Both;5 reps Hip Flexion/Marching: Seated;AROM;Strengthening;Both;5 reps Toe Raises: Seated;Strengthening;AROM;Both;10 reps Heel Raises: Seated;AROM;Strengthening;Both;10 reps    General Comments         Pertinent Vitals/Pain Pain Assessment: Faces Faces Pain Scale: Hurts little more Pain Location: pressure to BLE Pain Descriptors / Indicators: Sore;Discomfort;Grimacing Pain Intervention(s): Limited activity within patient's tolerance;Monitored during session    Home Living                      Prior Function            PT Goals (current goals can now be found in the care plan section) Acute Rehab PT Goals Patient Stated Goal: return home PT Goal Formulation: With patient/family Time For Goal Achievement: 02/08/18 Potential to Achieve Goals: Good Progress towards PT goals: Progressing toward goals    Frequency    Min 3X/week      PT Plan Current plan remains appropriate    Co-evaluation              AM-PAC PT "6 Clicks" Mobility   Outcome Measure  Help needed turning from your back to your side while in a flat bed without using bedrails?: A Lot Help needed moving from lying on your back to sitting on the side of a flat bed without using bedrails?: A Lot Help needed moving to and from a bed to a chair (including a wheelchair)?: A Lot Help needed standing up from a chair using your arms (e.g., wheelchair or bedside chair)?: A Lot Help needed to walk in hospital room?: Total Help needed climbing 3-5 steps with a railing? : Total 6 Click Score: 10    End of Session Equipment Utilized During Treatment: Gait belt Activity Tolerance: Patient tolerated treatment well;Patient limited by fatigue Patient left: in bed;with call bell/phone within reach;with bed alarm set;with family/visitor present Nurse Communication: Mobility status PT Visit Diagnosis: Unsteadiness on feet (R26.81);Other abnormalities of gait and mobility (R26.89);Muscle weakness (generalized) (M62.81)     Time: [id number] PT Time Calculation (min) (ACUTE ONLY): 33 min  Charges:  $Therapeutic Exercise: 8-22 mins $Therapeutic Activity: 8-22 mins                     12:29 PM,  01/28/18 Ocie BobJames Draycen Leichter, MPT Physical Therapist with [facility] [phone] office [phone] mobile phone

## 2018-01-29 LAB — CULTURE, BLOOD (ROUTINE X 2)
Culture: NO GROWTH
Culture: NO GROWTH
Special Requests: ADEQUATE

## 2018-01-31 ENCOUNTER — Telehealth: Payer: Self-pay

## 2018-01-31 DIAGNOSIS — J449 Chronic obstructive pulmonary disease, unspecified: Secondary | ICD-10-CM | POA: Diagnosis not present

## 2018-01-31 DIAGNOSIS — J189 Pneumonia, unspecified organism: Secondary | ICD-10-CM | POA: Diagnosis not present

## 2018-01-31 DIAGNOSIS — I2510 Atherosclerotic heart disease of native coronary artery without angina pectoris: Secondary | ICD-10-CM | POA: Diagnosis not present

## 2018-01-31 DIAGNOSIS — M1990 Unspecified osteoarthritis, unspecified site: Secondary | ICD-10-CM | POA: Diagnosis not present

## 2018-01-31 LAB — CULTURE, BLOOD (ROUTINE X 2)
Culture: NO GROWTH
Culture: NO GROWTH
Special Requests: ADEQUATE
Special Requests: ADEQUATE

## 2018-01-31 NOTE — Telephone Encounter (Signed)
Spoke with patient's daughter Ivette Loyal and she reports that her Mother was admitted to [facility] in [place] for rehabilitation for the next 20 days. States that her mother was in the hospital for pneumonia, UTI and that she had had a small stroke. She would like for you to call her when you have a moment to discuss her mother's care after she leaves the nursing facility. Thank-you

## 2018-02-01 ENCOUNTER — Encounter: Payer: Self-pay | Admitting: Family Medicine

## 2018-02-07 NOTE — Telephone Encounter (Signed)
[facility] wants to have a discussion, please schedule an appointment for a consultation

## 2018-02-07 NOTE — Telephone Encounter (Signed)
Spoke with Jennifer [name]. She said it is estimated Feb 17, 2018 for release not definite. We did set up appt for 02/24/18 at 3pm. I told her if something came up and she did not get to come home on the 6 to call and we would reschedule.

## 2018-02-11 DIAGNOSIS — G9341 Metabolic encephalopathy: Secondary | ICD-10-CM | POA: Diagnosis not present

## 2018-02-11 DIAGNOSIS — J189 Pneumonia, unspecified organism: Secondary | ICD-10-CM | POA: Diagnosis not present

## 2018-02-11 DIAGNOSIS — F0391 Unspecified dementia with behavioral disturbance: Secondary | ICD-10-CM | POA: Diagnosis not present

## 2018-02-11 DIAGNOSIS — J449 Chronic obstructive pulmonary disease, unspecified: Secondary | ICD-10-CM | POA: Diagnosis not present

## 2018-02-14 DIAGNOSIS — F0391 Unspecified dementia with behavioral disturbance: Secondary | ICD-10-CM | POA: Diagnosis not present

## 2018-02-14 DIAGNOSIS — J189 Pneumonia, unspecified organism: Secondary | ICD-10-CM | POA: Diagnosis not present

## 2018-02-14 DIAGNOSIS — J449 Chronic obstructive pulmonary disease, unspecified: Secondary | ICD-10-CM | POA: Diagnosis not present

## 2018-02-14 DIAGNOSIS — G9341 Metabolic encephalopathy: Secondary | ICD-10-CM | POA: Diagnosis not present

## 2018-02-15 ENCOUNTER — Ambulatory Visit: Payer: Medicare Other | Admitting: Family Medicine

## 2018-02-16 DIAGNOSIS — F0391 Unspecified dementia with behavioral disturbance: Secondary | ICD-10-CM | POA: Diagnosis not present

## 2018-02-16 DIAGNOSIS — M1990 Unspecified osteoarthritis, unspecified site: Secondary | ICD-10-CM | POA: Diagnosis not present

## 2018-02-16 DIAGNOSIS — I2510 Atherosclerotic heart disease of native coronary artery without angina pectoris: Secondary | ICD-10-CM | POA: Diagnosis not present

## 2018-02-16 DIAGNOSIS — J449 Chronic obstructive pulmonary disease, unspecified: Secondary | ICD-10-CM | POA: Diagnosis not present

## 2018-02-21 ENCOUNTER — Telehealth: Payer: Self-pay | Admitting: *Deleted

## 2018-02-21 DIAGNOSIS — G309 Alzheimer's disease, unspecified: Secondary | ICD-10-CM | POA: Diagnosis not present

## 2018-02-21 DIAGNOSIS — I10 Essential (primary) hypertension: Secondary | ICD-10-CM | POA: Diagnosis not present

## 2018-02-21 DIAGNOSIS — G8929 Other chronic pain: Secondary | ICD-10-CM | POA: Diagnosis not present

## 2018-02-21 DIAGNOSIS — Z9013 Acquired absence of bilateral breasts and nipples: Secondary | ICD-10-CM | POA: Diagnosis not present

## 2018-02-21 DIAGNOSIS — J440 Chronic obstructive pulmonary disease with acute lower respiratory infection: Secondary | ICD-10-CM | POA: Diagnosis not present

## 2018-02-21 DIAGNOSIS — Z96653 Presence of artificial knee joint, bilateral: Secondary | ICD-10-CM | POA: Diagnosis not present

## 2018-02-21 DIAGNOSIS — Z791 Long term (current) use of non-steroidal anti-inflammatories (NSAID): Secondary | ICD-10-CM | POA: Diagnosis not present

## 2018-02-21 DIAGNOSIS — Z87891 Personal history of nicotine dependence: Secondary | ICD-10-CM | POA: Diagnosis not present

## 2018-02-21 DIAGNOSIS — F0281 Dementia in other diseases classified elsewhere with behavioral disturbance: Secondary | ICD-10-CM | POA: Diagnosis not present

## 2018-02-21 DIAGNOSIS — I2510 Atherosclerotic heart disease of native coronary artery without angina pectoris: Secondary | ICD-10-CM | POA: Diagnosis not present

## 2018-02-21 DIAGNOSIS — J189 Pneumonia, unspecified organism: Secondary | ICD-10-CM | POA: Diagnosis not present

## 2018-02-21 DIAGNOSIS — Z87440 Personal history of urinary (tract) infections: Secondary | ICD-10-CM | POA: Diagnosis not present

## 2018-02-21 DIAGNOSIS — M48061 Spinal stenosis, lumbar region without neurogenic claudication: Secondary | ICD-10-CM | POA: Diagnosis not present

## 2018-02-21 DIAGNOSIS — Z9181 History of falling: Secondary | ICD-10-CM | POA: Diagnosis not present

## 2018-02-21 NOTE — Telephone Encounter (Signed)
Amy physical therapist with [facility] called wanting to get verbal orders for [facility] Physical Therapy 2 times a week for 4 weeks Nursing 2 times a week for 2 weeks and then 1 week 1 and a home health aide for 3 weeks 3. She can be reached at [phone] or the order can be faxed to [phone]

## 2018-02-21 NOTE — Telephone Encounter (Signed)
Do you agree?

## 2018-02-21 NOTE — Telephone Encounter (Signed)
Called Amy with [facility], left a detailed message giving verbal orders for Ms. Fazel physical and home health aide care

## 2018-02-21 NOTE — Telephone Encounter (Signed)
Agree pls give the orders

## 2018-02-22 ENCOUNTER — Telehealth: Payer: Self-pay | Admitting: *Deleted

## 2018-02-22 DIAGNOSIS — Z87440 Personal history of urinary (tract) infections: Secondary | ICD-10-CM | POA: Diagnosis not present

## 2018-02-22 DIAGNOSIS — M48061 Spinal stenosis, lumbar region without neurogenic claudication: Secondary | ICD-10-CM | POA: Diagnosis not present

## 2018-02-22 DIAGNOSIS — F0281 Dementia in other diseases classified elsewhere with behavioral disturbance: Secondary | ICD-10-CM | POA: Diagnosis not present

## 2018-02-22 DIAGNOSIS — J440 Chronic obstructive pulmonary disease with acute lower respiratory infection: Secondary | ICD-10-CM | POA: Diagnosis not present

## 2018-02-22 DIAGNOSIS — J189 Pneumonia, unspecified organism: Secondary | ICD-10-CM | POA: Diagnosis not present

## 2018-02-22 DIAGNOSIS — G309 Alzheimer's disease, unspecified: Secondary | ICD-10-CM | POA: Diagnosis not present

## 2018-02-22 NOTE — Telephone Encounter (Signed)
Cat Occupational Therapist with [facility] called wanting verbal orders to treat with home health occupational therapy twice a week for 4 weeks. Can be reached at [phone]

## 2018-02-22 NOTE — Telephone Encounter (Signed)
Spoke with Cat from [facility], gave verbal order for Jennifer [name] to receive occupational therapy twice a week for four weeks.

## 2018-02-23 DIAGNOSIS — M48061 Spinal stenosis, lumbar region without neurogenic claudication: Secondary | ICD-10-CM | POA: Diagnosis not present

## 2018-02-23 DIAGNOSIS — F0281 Dementia in other diseases classified elsewhere with behavioral disturbance: Secondary | ICD-10-CM | POA: Diagnosis not present

## 2018-02-23 DIAGNOSIS — J189 Pneumonia, unspecified organism: Secondary | ICD-10-CM | POA: Diagnosis not present

## 2018-02-23 DIAGNOSIS — G309 Alzheimer's disease, unspecified: Secondary | ICD-10-CM | POA: Diagnosis not present

## 2018-02-23 DIAGNOSIS — Z87440 Personal history of urinary (tract) infections: Secondary | ICD-10-CM | POA: Diagnosis not present

## 2018-02-23 DIAGNOSIS — J440 Chronic obstructive pulmonary disease with acute lower respiratory infection: Secondary | ICD-10-CM | POA: Diagnosis not present

## 2018-02-24 ENCOUNTER — Ambulatory Visit (INDEPENDENT_AMBULATORY_CARE_PROVIDER_SITE_OTHER): Payer: Medicare Other | Admitting: Family Medicine

## 2018-02-24 ENCOUNTER — Encounter: Payer: Self-pay | Admitting: Family Medicine

## 2018-02-24 VITALS — BP 100/70 | HR 77 | Resp 15 | Ht 64.5 in

## 2018-02-24 DIAGNOSIS — J189 Pneumonia, unspecified organism: Secondary | ICD-10-CM | POA: Diagnosis not present

## 2018-02-24 DIAGNOSIS — M159 Polyosteoarthritis, unspecified: Secondary | ICD-10-CM

## 2018-02-24 DIAGNOSIS — F0281 Dementia in other diseases classified elsewhere with behavioral disturbance: Secondary | ICD-10-CM | POA: Diagnosis not present

## 2018-02-24 DIAGNOSIS — F0280 Dementia in other diseases classified elsewhere without behavioral disturbance: Secondary | ICD-10-CM | POA: Diagnosis not present

## 2018-02-24 DIAGNOSIS — M150 Primary generalized (osteo)arthritis: Secondary | ICD-10-CM | POA: Diagnosis not present

## 2018-02-24 DIAGNOSIS — G301 Alzheimer's disease with late onset: Secondary | ICD-10-CM | POA: Diagnosis not present

## 2018-02-24 DIAGNOSIS — Z7189 Other specified counseling: Secondary | ICD-10-CM

## 2018-02-24 DIAGNOSIS — J449 Chronic obstructive pulmonary disease, unspecified: Secondary | ICD-10-CM

## 2018-02-24 DIAGNOSIS — J440 Chronic obstructive pulmonary disease with acute lower respiratory infection: Secondary | ICD-10-CM | POA: Diagnosis not present

## 2018-02-24 DIAGNOSIS — Z87440 Personal history of urinary (tract) infections: Secondary | ICD-10-CM | POA: Diagnosis not present

## 2018-02-24 DIAGNOSIS — M48061 Spinal stenosis, lumbar region without neurogenic claudication: Secondary | ICD-10-CM | POA: Diagnosis not present

## 2018-02-24 DIAGNOSIS — G309 Alzheimer's disease, unspecified: Secondary | ICD-10-CM | POA: Diagnosis not present

## 2018-02-24 MED ORDER — LIDOCAINE 5 % EX PTCH: 1.0000 | MEDICATED_PATCH | CUTANEOUS | 5 refills | Status: DC

## 2018-02-24 NOTE — Patient Instructions (Signed)
F/U in 4.5 months, call iof you need me before   Lidoderm patch is prescribed  Be careful not to fall  Enjoy the candy!!  We LOVE you Jennifer [name] AllanVelma, and your daughter LOVES you so  Much more!

## 2018-02-25 ENCOUNTER — Encounter: Payer: Self-pay | Admitting: Family Medicine

## 2018-02-25 DIAGNOSIS — F0281 Dementia in other diseases classified elsewhere with behavioral disturbance: Secondary | ICD-10-CM | POA: Diagnosis not present

## 2018-02-25 DIAGNOSIS — M48061 Spinal stenosis, lumbar region without neurogenic claudication: Secondary | ICD-10-CM | POA: Diagnosis not present

## 2018-02-25 DIAGNOSIS — Z87440 Personal history of urinary (tract) infections: Secondary | ICD-10-CM | POA: Diagnosis not present

## 2018-02-25 DIAGNOSIS — G309 Alzheimer's disease, unspecified: Secondary | ICD-10-CM | POA: Diagnosis not present

## 2018-02-25 DIAGNOSIS — J189 Pneumonia, unspecified organism: Secondary | ICD-10-CM | POA: Diagnosis not present

## 2018-02-25 DIAGNOSIS — J440 Chronic obstructive pulmonary disease with acute lower respiratory infection: Secondary | ICD-10-CM | POA: Diagnosis not present

## 2018-02-25 NOTE — Assessment & Plan Note (Signed)
Stable continue current medication

## 2018-02-25 NOTE — Assessment & Plan Note (Signed)
Thirty minutes spent with daughter , discussing health care she desires for her Mom moving forward. She is at the point where she recognizes and states that quality rather than quantity is most important where life is concerned States she never wants to put her mother through what she went through during her recent hospitalization Has decided on comfort care, not yet ready to consider hospice

## 2018-02-25 NOTE — Assessment & Plan Note (Signed)
Reports at visit, that Mother is  Less verbal and more docile since recent hospitalization, however overall less interactive and more somnolent

## 2018-02-25 NOTE — Assessment & Plan Note (Signed)
Severe arthritis of spine,  And knees, management changed to lidoderm patch with success

## 2018-02-25 NOTE — Progress Notes (Signed)
   Jennifer [name]     MRN: [id number]      DOB: 1922-03-02   HPI Jennifer [name] is here for follow up and re-evaluation of chronic medical conditions, medication management and review of any available recent lab and radiology data.  She was hospitALIZED FROM 01/13 TO 01/28/2018 WITH PNEUMONIA AND UTI , AND [facility] , RECENTLY DISCHARGED HOME TO THE CARE OF HER DAUGHTER. Daughter states that at one point during hospitalization she refused further blood draws, as she felt that the pain and suffering was greater than the benefit She is getting to the point where she is accepting that quality of life, rather than quantity is always more important and is considering comfort care/ hospice rather than current level of care. Also realizes increasingly that facilities available to her Mother are very linmited. Noted to be less interactive , hoiwever , states that her appetite is good Trying to get in home care for her mom part time  ROS Denies recent fever or chills. Denies sinus pressure, nasal congestion, ear pain or sore throat. Denies chest congestion, productive cough or wheezing. Denies chest pains, palpitations and leg swelling Denies abdominal pain, nausea, vomiting,diarrhea or constipation.   Denies dysuria, frequency, hesitancy or incontinence. Denies joint pain, swelling and limitation in mobility. Denies headaches, seizures, numbness, or tingling. Denies depression, anxiety or insomnia. Denies skin break down or rash.   PE  BP 100/70   Pulse 77   Resp 15   SpO2 98%   Patient sitting  in no cardiopulmonary distress.bitemporal wasting, conjunctival erythema, less alert , and less interactive than in the past  HEENT: No facial asymmetry, EOMI,   oropharynx pink and moist.  Neck decreased ROM no JVD, no mass.  Chest: Clear to auscultation bilaterally.  CVS: S1, S2 no murmurs, no S3.Regular rate.  ABD: Soft non tender.   Ext: No edema  MS:  Markedly reduced ROM spine, shoulders, hips and knees.  Skin: Intact, no ulcerations or rash noted.on visible skin and no report of skin breakdown  CNS: CN 2-12 intact,marked hearing loss,  power,  normal throughout.no .   Assessment & Plan  Advanced care planning/counseling discussion Thirty minutes spent with daughter , discussing health care she desires for her Mom moving forward. She is at the point where she recognizes and states that quality rather than quantity is most important where life is concerned States she never wants to put her mother through what she went through during her recent hospitalization Has decided on comfort care, not yet ready to consider hospice  Dementia without behavioral disturbance (HCC) Reports at visit, that Mother is  Less verbal and more docile since recent hospitalization, however overall less interactive and more somnolent  COPD (chronic obstructive pulmonary disease) (HCC) Stable continue current medication  Osteoarthritis Severe arthritis of spine,  And knees, management changed to lidoderm patch with success

## 2018-02-28 ENCOUNTER — Telehealth: Payer: Self-pay | Admitting: Family Medicine

## 2018-02-28 NOTE — Telephone Encounter (Signed)
Jennifer [name] is calling from [facility] [phone] to advise she went out for Sp Therapy eval only, gave lots of Education to the Daughter. However they only want PT & OT, no Speech

## 2018-02-28 NOTE — Telephone Encounter (Signed)
Noted  

## 2018-03-01 DIAGNOSIS — M48061 Spinal stenosis, lumbar region without neurogenic claudication: Secondary | ICD-10-CM | POA: Diagnosis not present

## 2018-03-01 DIAGNOSIS — J189 Pneumonia, unspecified organism: Secondary | ICD-10-CM | POA: Diagnosis not present

## 2018-03-01 DIAGNOSIS — G309 Alzheimer's disease, unspecified: Secondary | ICD-10-CM | POA: Diagnosis not present

## 2018-03-01 DIAGNOSIS — F0281 Dementia in other diseases classified elsewhere with behavioral disturbance: Secondary | ICD-10-CM | POA: Diagnosis not present

## 2018-03-01 DIAGNOSIS — J440 Chronic obstructive pulmonary disease with acute lower respiratory infection: Secondary | ICD-10-CM | POA: Diagnosis not present

## 2018-03-01 DIAGNOSIS — Z87440 Personal history of urinary (tract) infections: Secondary | ICD-10-CM | POA: Diagnosis not present

## 2018-03-02 ENCOUNTER — Telehealth: Payer: Self-pay | Admitting: Family Medicine

## 2018-03-02 ENCOUNTER — Other Ambulatory Visit: Payer: Self-pay | Admitting: Family Medicine

## 2018-03-02 ENCOUNTER — Other Ambulatory Visit: Payer: Self-pay

## 2018-03-02 DIAGNOSIS — F0281 Dementia in other diseases classified elsewhere with behavioral disturbance: Secondary | ICD-10-CM | POA: Diagnosis not present

## 2018-03-02 DIAGNOSIS — Z87440 Personal history of urinary (tract) infections: Secondary | ICD-10-CM | POA: Diagnosis not present

## 2018-03-02 DIAGNOSIS — J189 Pneumonia, unspecified organism: Secondary | ICD-10-CM | POA: Diagnosis not present

## 2018-03-02 DIAGNOSIS — M48061 Spinal stenosis, lumbar region without neurogenic claudication: Secondary | ICD-10-CM | POA: Diagnosis not present

## 2018-03-02 DIAGNOSIS — G309 Alzheimer's disease, unspecified: Secondary | ICD-10-CM | POA: Diagnosis not present

## 2018-03-02 DIAGNOSIS — J440 Chronic obstructive pulmonary disease with acute lower respiratory infection: Secondary | ICD-10-CM | POA: Diagnosis not present

## 2018-03-02 MED ORDER — DOXYCYCLINE HYCLATE 100 MG PO TABS: 100.0000 mg | ORAL_TABLET | Freq: Two times a day (BID) | ORAL | 0 refills | Status: DC

## 2018-03-02 MED ORDER — LIDOCAINE 5 % EX PTCH: 1.0000 | MEDICATED_PATCH | CUTANEOUS | 5 refills | Status: AC

## 2018-03-02 NOTE — Progress Notes (Signed)
Doxycycline

## 2018-03-02 NOTE — Telephone Encounter (Signed)
Please advise 

## 2018-03-02 NOTE — Telephone Encounter (Signed)
Pt is calling possible UTI via the Nurse foul smelling oder --wants something called in.

## 2018-03-02 NOTE — Telephone Encounter (Signed)
Called and spoke with daughter, [name], She reports no fever, no changes in mental state or behavior, no changes in appetite, appetite is good. Aide just reported strong smelling urine from her pad when giving her a bath. Advised daughter to keep her Mother well hydrated and watch for any changes. If she had any concerns to call us back. Daughter agreed and was satisfied with this response

## 2018-03-02 NOTE — Telephone Encounter (Signed)
noted 

## 2018-03-02 NOTE — Telephone Encounter (Signed)
Pls encourage urine to be sent for c/s ,also increase water intake since no fever, or change in appetite or mental state I do not generally just "treat with antibiotic" If unable to get specimen , this time I will send in 3 day antibiotic course of doxycycline

## 2018-03-03 ENCOUNTER — Telehealth: Payer: Self-pay

## 2018-03-03 DIAGNOSIS — Z87440 Personal history of urinary (tract) infections: Secondary | ICD-10-CM | POA: Diagnosis not present

## 2018-03-03 DIAGNOSIS — F0281 Dementia in other diseases classified elsewhere with behavioral disturbance: Secondary | ICD-10-CM | POA: Diagnosis not present

## 2018-03-03 DIAGNOSIS — J189 Pneumonia, unspecified organism: Secondary | ICD-10-CM | POA: Diagnosis not present

## 2018-03-03 DIAGNOSIS — J440 Chronic obstructive pulmonary disease with acute lower respiratory infection: Secondary | ICD-10-CM | POA: Diagnosis not present

## 2018-03-03 DIAGNOSIS — G309 Alzheimer's disease, unspecified: Secondary | ICD-10-CM | POA: Diagnosis not present

## 2018-03-03 DIAGNOSIS — M48061 Spinal stenosis, lumbar region without neurogenic claudication: Secondary | ICD-10-CM | POA: Diagnosis not present

## 2018-03-03 NOTE — Telephone Encounter (Signed)
Called and spoke with patient's daughter Roselind Rily and she stated that [facility] had called her and informed her that the antibiotic was ready there at the pharmacy, she went and picked it up and started giving it to her Mother last night. I explained that I had misunderstood the message from Dr. Lodema Hong that was sent and apologized.

## 2018-03-07 DIAGNOSIS — J440 Chronic obstructive pulmonary disease with acute lower respiratory infection: Secondary | ICD-10-CM | POA: Diagnosis not present

## 2018-03-07 DIAGNOSIS — M48061 Spinal stenosis, lumbar region without neurogenic claudication: Secondary | ICD-10-CM | POA: Diagnosis not present

## 2018-03-07 DIAGNOSIS — J189 Pneumonia, unspecified organism: Secondary | ICD-10-CM | POA: Diagnosis not present

## 2018-03-07 DIAGNOSIS — Z87440 Personal history of urinary (tract) infections: Secondary | ICD-10-CM | POA: Diagnosis not present

## 2018-03-07 DIAGNOSIS — G309 Alzheimer's disease, unspecified: Secondary | ICD-10-CM | POA: Diagnosis not present

## 2018-03-07 DIAGNOSIS — F0281 Dementia in other diseases classified elsewhere with behavioral disturbance: Secondary | ICD-10-CM | POA: Diagnosis not present

## 2018-03-08 DIAGNOSIS — J189 Pneumonia, unspecified organism: Secondary | ICD-10-CM | POA: Diagnosis not present

## 2018-03-08 DIAGNOSIS — G8929 Other chronic pain: Secondary | ICD-10-CM | POA: Diagnosis not present

## 2018-03-08 DIAGNOSIS — F0281 Dementia in other diseases classified elsewhere with behavioral disturbance: Secondary | ICD-10-CM | POA: Diagnosis not present

## 2018-03-08 DIAGNOSIS — Z9181 History of falling: Secondary | ICD-10-CM | POA: Diagnosis not present

## 2018-03-08 DIAGNOSIS — I10 Essential (primary) hypertension: Secondary | ICD-10-CM | POA: Diagnosis not present

## 2018-03-08 DIAGNOSIS — G309 Alzheimer's disease, unspecified: Secondary | ICD-10-CM | POA: Diagnosis not present

## 2018-03-08 DIAGNOSIS — Z9013 Acquired absence of bilateral breasts and nipples: Secondary | ICD-10-CM | POA: Diagnosis not present

## 2018-03-08 DIAGNOSIS — Z87891 Personal history of nicotine dependence: Secondary | ICD-10-CM

## 2018-03-08 DIAGNOSIS — J440 Chronic obstructive pulmonary disease with acute lower respiratory infection: Secondary | ICD-10-CM | POA: Diagnosis not present

## 2018-03-08 DIAGNOSIS — Z96653 Presence of artificial knee joint, bilateral: Secondary | ICD-10-CM | POA: Diagnosis not present

## 2018-03-08 DIAGNOSIS — Z87440 Personal history of urinary (tract) infections: Secondary | ICD-10-CM | POA: Diagnosis not present

## 2018-03-08 DIAGNOSIS — M48061 Spinal stenosis, lumbar region without neurogenic claudication: Secondary | ICD-10-CM | POA: Diagnosis not present

## 2018-03-08 DIAGNOSIS — I2510 Atherosclerotic heart disease of native coronary artery without angina pectoris: Secondary | ICD-10-CM | POA: Diagnosis not present

## 2018-03-08 DIAGNOSIS — Z791 Long term (current) use of non-steroidal anti-inflammatories (NSAID): Secondary | ICD-10-CM

## 2018-03-10 ENCOUNTER — Telehealth: Payer: Self-pay | Admitting: Family Medicine

## 2018-03-10 DIAGNOSIS — F0281 Dementia in other diseases classified elsewhere with behavioral disturbance: Secondary | ICD-10-CM | POA: Diagnosis not present

## 2018-03-10 DIAGNOSIS — J189 Pneumonia, unspecified organism: Secondary | ICD-10-CM | POA: Diagnosis not present

## 2018-03-10 DIAGNOSIS — G309 Alzheimer's disease, unspecified: Secondary | ICD-10-CM | POA: Diagnosis not present

## 2018-03-10 DIAGNOSIS — Z87440 Personal history of urinary (tract) infections: Secondary | ICD-10-CM | POA: Diagnosis not present

## 2018-03-10 DIAGNOSIS — J440 Chronic obstructive pulmonary disease with acute lower respiratory infection: Secondary | ICD-10-CM | POA: Diagnosis not present

## 2018-03-10 DIAGNOSIS — M48061 Spinal stenosis, lumbar region without neurogenic claudication: Secondary | ICD-10-CM | POA: Diagnosis not present

## 2018-03-10 NOTE — Telephone Encounter (Signed)
Pls call daughter Marthe Patch, explain lidocaine patch is denied because reason for use is primarily osteoarthritis , which is not covered. Please ask her and document if her nother has ever had shingles to her knowledge   If the answer is yes, and she states that her mother's pain is definitely not controlled without the patch, or worse without it, then I will re send rx with new dx. She will need to provide an appt year/ date that pt had  shingles , please send me a response, paperwork from [facility] is in your box with further detail  thanks

## 2018-03-11 ENCOUNTER — Encounter: Payer: Self-pay | Admitting: Family Medicine

## 2018-03-11 DIAGNOSIS — B0223 Postherpetic polyneuropathy: Secondary | ICD-10-CM | POA: Insufficient documentation

## 2018-03-11 NOTE — Telephone Encounter (Signed)
pls resubmit request for the lidoderm patch with dx code  Of B02 23, which is shingles polyneuropathy, I have added that to her problem list

## 2018-03-11 NOTE — Telephone Encounter (Signed)
Unsure date but daughter said she has definitely had shingles

## 2018-03-11 NOTE — Telephone Encounter (Signed)
Called patient and left message for them to return call at the office   

## 2018-03-14 ENCOUNTER — Ambulatory Visit (INDEPENDENT_AMBULATORY_CARE_PROVIDER_SITE_OTHER): Payer: Medicare Other | Admitting: Otolaryngology

## 2018-03-14 DIAGNOSIS — H6123 Impacted cerumen, bilateral: Secondary | ICD-10-CM

## 2018-03-15 DIAGNOSIS — F0281 Dementia in other diseases classified elsewhere with behavioral disturbance: Secondary | ICD-10-CM | POA: Diagnosis not present

## 2018-03-15 DIAGNOSIS — J189 Pneumonia, unspecified organism: Secondary | ICD-10-CM | POA: Diagnosis not present

## 2018-03-15 DIAGNOSIS — J440 Chronic obstructive pulmonary disease with acute lower respiratory infection: Secondary | ICD-10-CM | POA: Diagnosis not present

## 2018-03-15 DIAGNOSIS — Z87440 Personal history of urinary (tract) infections: Secondary | ICD-10-CM | POA: Diagnosis not present

## 2018-03-15 DIAGNOSIS — M48061 Spinal stenosis, lumbar region without neurogenic claudication: Secondary | ICD-10-CM | POA: Diagnosis not present

## 2018-03-15 DIAGNOSIS — G309 Alzheimer's disease, unspecified: Secondary | ICD-10-CM | POA: Diagnosis not present

## 2018-03-15 NOTE — Telephone Encounter (Signed)
Appeal submitted with updated diagnosis code. Will await decision

## 2018-03-21 ENCOUNTER — Telehealth: Payer: Self-pay | Admitting: Family Medicine

## 2018-03-21 NOTE — Telephone Encounter (Signed)
pls do give verbal ok for request , thanks

## 2018-03-21 NOTE — Telephone Encounter (Signed)
Amy with [facility] is callig she wants to extended PT 2 x a week for 3 weeks   Can leave verbal on her machine

## 2018-03-21 NOTE — Telephone Encounter (Signed)
Verbal ok given.

## 2018-03-21 NOTE — Telephone Encounter (Signed)
Sending.

## 2018-03-21 NOTE — Telephone Encounter (Signed)
If this is OK with you I will call

## 2018-03-23 ENCOUNTER — Telehealth: Payer: Self-pay | Admitting: *Deleted

## 2018-03-23 DIAGNOSIS — I2510 Atherosclerotic heart disease of native coronary artery without angina pectoris: Secondary | ICD-10-CM | POA: Diagnosis not present

## 2018-03-23 DIAGNOSIS — Z96653 Presence of artificial knee joint, bilateral: Secondary | ICD-10-CM | POA: Diagnosis not present

## 2018-03-23 DIAGNOSIS — F0281 Dementia in other diseases classified elsewhere with behavioral disturbance: Secondary | ICD-10-CM | POA: Diagnosis not present

## 2018-03-23 DIAGNOSIS — Z9181 History of falling: Secondary | ICD-10-CM | POA: Diagnosis not present

## 2018-03-23 DIAGNOSIS — M48061 Spinal stenosis, lumbar region without neurogenic claudication: Secondary | ICD-10-CM | POA: Diagnosis not present

## 2018-03-23 DIAGNOSIS — J189 Pneumonia, unspecified organism: Secondary | ICD-10-CM | POA: Diagnosis not present

## 2018-03-23 DIAGNOSIS — I10 Essential (primary) hypertension: Secondary | ICD-10-CM | POA: Diagnosis not present

## 2018-03-23 DIAGNOSIS — Z791 Long term (current) use of non-steroidal anti-inflammatories (NSAID): Secondary | ICD-10-CM | POA: Diagnosis not present

## 2018-03-23 DIAGNOSIS — Z87891 Personal history of nicotine dependence: Secondary | ICD-10-CM | POA: Diagnosis not present

## 2018-03-23 DIAGNOSIS — N3000 Acute cystitis without hematuria: Secondary | ICD-10-CM

## 2018-03-23 DIAGNOSIS — J440 Chronic obstructive pulmonary disease with acute lower respiratory infection: Secondary | ICD-10-CM | POA: Diagnosis not present

## 2018-03-23 DIAGNOSIS — G8929 Other chronic pain: Secondary | ICD-10-CM | POA: Diagnosis not present

## 2018-03-23 DIAGNOSIS — G309 Alzheimer's disease, unspecified: Secondary | ICD-10-CM | POA: Diagnosis not present

## 2018-03-23 DIAGNOSIS — Z87440 Personal history of urinary (tract) infections: Secondary | ICD-10-CM | POA: Diagnosis not present

## 2018-03-23 DIAGNOSIS — Z9013 Acquired absence of bilateral breasts and nipples: Secondary | ICD-10-CM | POA: Diagnosis not present

## 2018-03-23 NOTE — Telephone Encounter (Signed)
Pts daughter [name] (on dpr) called stating that she thought Ms. Bellmore had another UTI. She has a lot of odor to her urine. Also her urine is cloudy. Not extremely dark but cloudy. She wanted to know if she could get the supplies to catch her urine as even if she brought her into the office she probably would not be able to catch it here. Pt has appt on 3/16 wanted to know what she could do.

## 2018-03-24 DIAGNOSIS — Z87440 Personal history of urinary (tract) infections: Secondary | ICD-10-CM | POA: Diagnosis not present

## 2018-03-24 DIAGNOSIS — J189 Pneumonia, unspecified organism: Secondary | ICD-10-CM | POA: Diagnosis not present

## 2018-03-24 DIAGNOSIS — F0281 Dementia in other diseases classified elsewhere with behavioral disturbance: Secondary | ICD-10-CM | POA: Diagnosis not present

## 2018-03-24 DIAGNOSIS — J440 Chronic obstructive pulmonary disease with acute lower respiratory infection: Secondary | ICD-10-CM | POA: Diagnosis not present

## 2018-03-24 DIAGNOSIS — M48061 Spinal stenosis, lumbar region without neurogenic claudication: Secondary | ICD-10-CM | POA: Diagnosis not present

## 2018-03-24 DIAGNOSIS — G309 Alzheimer's disease, unspecified: Secondary | ICD-10-CM | POA: Diagnosis not present

## 2018-03-24 NOTE — Telephone Encounter (Signed)
Yes pls have her , [name] , collect CCUAaaSAP , dip here , iof possible, if not just straight to the lab with reflex c/s please

## 2018-03-24 NOTE — Telephone Encounter (Signed)
[name] aware and will take her to lab to leave urine

## 2018-03-25 DIAGNOSIS — N3000 Acute cystitis without hematuria: Secondary | ICD-10-CM | POA: Diagnosis not present

## 2018-03-26 LAB — URINALYSIS W MICROSCOPIC + REFLEX CULTURE
Bilirubin Urine: NEGATIVE
GLUCOSE, UA: NEGATIVE
HGB URINE DIPSTICK: NEGATIVE
HYALINE CAST: NONE SEEN /LPF
Ketones, ur: NEGATIVE
Leukocyte Esterase: NEGATIVE
Nitrites, Initial: NEGATIVE
Specific Gravity, Urine: 1.032 (ref 1.001–1.03)
pH: 6 (ref 5.0–8.0)

## 2018-03-26 LAB — NO CULTURE INDICATED

## 2018-03-28 ENCOUNTER — Telehealth: Payer: Self-pay

## 2018-03-28 ENCOUNTER — Ambulatory Visit: Payer: Medicare Other | Admitting: Family Medicine

## 2018-03-28 DIAGNOSIS — F0281 Dementia in other diseases classified elsewhere with behavioral disturbance: Secondary | ICD-10-CM | POA: Diagnosis not present

## 2018-03-28 DIAGNOSIS — Z87440 Personal history of urinary (tract) infections: Secondary | ICD-10-CM | POA: Diagnosis not present

## 2018-03-28 DIAGNOSIS — J440 Chronic obstructive pulmonary disease with acute lower respiratory infection: Secondary | ICD-10-CM | POA: Diagnosis not present

## 2018-03-28 DIAGNOSIS — J189 Pneumonia, unspecified organism: Secondary | ICD-10-CM | POA: Diagnosis not present

## 2018-03-28 DIAGNOSIS — G309 Alzheimer's disease, unspecified: Secondary | ICD-10-CM | POA: Diagnosis not present

## 2018-03-28 DIAGNOSIS — M48061 Spinal stenosis, lumbar region without neurogenic claudication: Secondary | ICD-10-CM | POA: Diagnosis not present

## 2018-03-28 NOTE — Telephone Encounter (Signed)
I recommend tylenol ES one ce or twice daily. All of the other pain medications are in the same catgory as the celebrex. I would add the tylenol , this is oTC  500 mg tablet

## 2018-03-28 NOTE — Telephone Encounter (Signed)
States her mother has been on celebrex for a long time and her PT was telling them how celebrex becomes less effective the longer you take it and Jennifer [name] wants to know if it can be changed to something else. Please advise

## 2018-03-30 DIAGNOSIS — J189 Pneumonia, unspecified organism: Secondary | ICD-10-CM | POA: Diagnosis not present

## 2018-03-30 DIAGNOSIS — M48061 Spinal stenosis, lumbar region without neurogenic claudication: Secondary | ICD-10-CM | POA: Diagnosis not present

## 2018-03-30 DIAGNOSIS — J440 Chronic obstructive pulmonary disease with acute lower respiratory infection: Secondary | ICD-10-CM | POA: Diagnosis not present

## 2018-03-30 DIAGNOSIS — F0281 Dementia in other diseases classified elsewhere with behavioral disturbance: Secondary | ICD-10-CM | POA: Diagnosis not present

## 2018-03-30 DIAGNOSIS — G309 Alzheimer's disease, unspecified: Secondary | ICD-10-CM | POA: Diagnosis not present

## 2018-03-30 DIAGNOSIS — Z87440 Personal history of urinary (tract) infections: Secondary | ICD-10-CM | POA: Diagnosis not present

## 2018-03-30 NOTE — Telephone Encounter (Signed)
Patient daughter aware. °

## 2018-04-04 DIAGNOSIS — M48061 Spinal stenosis, lumbar region without neurogenic claudication: Secondary | ICD-10-CM | POA: Diagnosis not present

## 2018-04-04 DIAGNOSIS — J189 Pneumonia, unspecified organism: Secondary | ICD-10-CM | POA: Diagnosis not present

## 2018-04-04 DIAGNOSIS — J440 Chronic obstructive pulmonary disease with acute lower respiratory infection: Secondary | ICD-10-CM | POA: Diagnosis not present

## 2018-04-04 DIAGNOSIS — F0281 Dementia in other diseases classified elsewhere with behavioral disturbance: Secondary | ICD-10-CM | POA: Diagnosis not present

## 2018-04-04 DIAGNOSIS — G309 Alzheimer's disease, unspecified: Secondary | ICD-10-CM | POA: Diagnosis not present

## 2018-04-04 DIAGNOSIS — Z87440 Personal history of urinary (tract) infections: Secondary | ICD-10-CM | POA: Diagnosis not present

## 2018-04-07 ENCOUNTER — Other Ambulatory Visit: Payer: Self-pay | Admitting: Family Medicine

## 2018-04-07 ENCOUNTER — Telehealth: Payer: Self-pay | Admitting: *Deleted

## 2018-04-07 DIAGNOSIS — F0281 Dementia in other diseases classified elsewhere with behavioral disturbance: Secondary | ICD-10-CM | POA: Diagnosis not present

## 2018-04-07 DIAGNOSIS — G309 Alzheimer's disease, unspecified: Secondary | ICD-10-CM | POA: Diagnosis not present

## 2018-04-07 DIAGNOSIS — J440 Chronic obstructive pulmonary disease with acute lower respiratory infection: Secondary | ICD-10-CM | POA: Diagnosis not present

## 2018-04-07 DIAGNOSIS — M150 Primary generalized (osteo)arthritis: Principal | ICD-10-CM

## 2018-04-07 DIAGNOSIS — M48061 Spinal stenosis, lumbar region without neurogenic claudication: Secondary | ICD-10-CM | POA: Diagnosis not present

## 2018-04-07 DIAGNOSIS — Z87440 Personal history of urinary (tract) infections: Secondary | ICD-10-CM | POA: Diagnosis not present

## 2018-04-07 DIAGNOSIS — M159 Polyosteoarthritis, unspecified: Secondary | ICD-10-CM

## 2018-04-07 DIAGNOSIS — J189 Pneumonia, unspecified organism: Secondary | ICD-10-CM | POA: Diagnosis not present

## 2018-04-07 MED ORDER — TRAMADOL HCL 50 MG PO TABS: 50.0000 mg | ORAL_TABLET | Freq: Two times a day (BID) | ORAL | 0 refills | Status: DC | PRN

## 2018-04-07 NOTE — Progress Notes (Signed)
Ms Lares's daughter called in to ask about use of pain medication for physical therapy. She had discussed it with the physical therapist and they thought a low dose of something may help her when she needs to do physical therapy.   After reviewing chart, I have ordered a one time prescription of Tramadol 50 mg to be taken twice daily as needed. With instructions to take prior to PT.   Daughter advised that a office visit will be needed if this medication will be continued.   Freddy Finner, DNP, AGNP-BC [facility] [address], [address] [place], [place] [address] Office Hours: Mon-Thurs 8 am-5 pm; Fri 8 am-12 pm Office Phone:  [phone]  Office Fax: [phone]

## 2018-04-07 NOTE — Telephone Encounter (Signed)
Spoke with daughter and advised of response with verbal understanding

## 2018-04-07 NOTE — Telephone Encounter (Signed)
Please advise 

## 2018-04-07 NOTE — Telephone Encounter (Signed)
Pts daughter called wanting to know if she could try her mother on tramedol low dose as she is in pain and its preventing her mother from being very active and doing physical therapy. She had discussed it with the physical therapist and they thought a low dose may help her when she needs to do physical therapy. Just wanted to see what Dr. Lodema Hong thought and recommended

## 2018-04-13 DIAGNOSIS — W19XXXA Unspecified fall, initial encounter: Secondary | ICD-10-CM | POA: Diagnosis not present

## 2018-04-13 DIAGNOSIS — R5381 Other malaise: Secondary | ICD-10-CM | POA: Diagnosis not present

## 2018-04-13 DIAGNOSIS — R69 Illness, unspecified: Secondary | ICD-10-CM | POA: Diagnosis not present

## 2018-04-21 ENCOUNTER — Telehealth: Payer: Self-pay | Admitting: *Deleted

## 2018-04-21 NOTE — Telephone Encounter (Signed)
[name] daughter (on [place]) called stated she was concerned about Jennifer [name] as she was sleeping a lot. She has no desire to get up she has not eat or drank anything and has not had a bm since a week and a half. This has been going on since Tuesday evening. This also happened last Thursday and Friday but over the weekend she was ok. She hasnt walked since Sunday night. She did use the wheelchair Tuesday night because she wouldn't walk but she did get up after going to bed to go to the bathroom. That was the last time she walked. She just wanted to know what she needed to do.

## 2018-04-21 NOTE — Telephone Encounter (Signed)
Since her level of responsiveness and activity are markedly reduced , I recommend she have her evaluated in the ED

## 2018-04-24 ENCOUNTER — Emergency Department (HOSPITAL_COMMUNITY)
Admission: EM | Admit: 2018-04-24 | Discharge: 2018-04-24 | Disposition: A | Discharge status: Home / Self Care | Payer: Medicare Other | Attending: Emergency Medicine | Admitting: Emergency Medicine

## 2018-04-24 ENCOUNTER — Emergency Department (HOSPITAL_COMMUNITY): Payer: Medicare Other

## 2018-04-24 ENCOUNTER — Other Ambulatory Visit: Payer: Self-pay

## 2018-04-24 ENCOUNTER — Encounter (HOSPITAL_COMMUNITY): Payer: Self-pay | Admitting: Emergency Medicine

## 2018-04-24 DIAGNOSIS — F0390 Unspecified dementia without behavioral disturbance: Secondary | ICD-10-CM | POA: Insufficient documentation

## 2018-04-24 DIAGNOSIS — J181 Lobar pneumonia, unspecified organism: Secondary | ICD-10-CM

## 2018-04-24 DIAGNOSIS — R4182 Altered mental status, unspecified: Secondary | ICD-10-CM | POA: Insufficient documentation

## 2018-04-24 DIAGNOSIS — R5381 Other malaise: Secondary | ICD-10-CM | POA: Diagnosis not present

## 2018-04-24 DIAGNOSIS — J189 Pneumonia, unspecified organism: Secondary | ICD-10-CM | POA: Diagnosis not present

## 2018-04-24 DIAGNOSIS — N390 Urinary tract infection, site not specified: Secondary | ICD-10-CM | POA: Diagnosis not present

## 2018-04-24 DIAGNOSIS — I517 Cardiomegaly: Secondary | ICD-10-CM | POA: Diagnosis not present

## 2018-04-24 DIAGNOSIS — R918 Other nonspecific abnormal finding of lung field: Secondary | ICD-10-CM | POA: Diagnosis not present

## 2018-04-24 DIAGNOSIS — R350 Frequency of micturition: Secondary | ICD-10-CM | POA: Diagnosis present

## 2018-04-24 DIAGNOSIS — J449 Chronic obstructive pulmonary disease, unspecified: Secondary | ICD-10-CM | POA: Diagnosis not present

## 2018-04-24 DIAGNOSIS — Z96652 Presence of left artificial knee joint: Secondary | ICD-10-CM | POA: Insufficient documentation

## 2018-04-24 DIAGNOSIS — R011 Cardiac murmur, unspecified: Secondary | ICD-10-CM | POA: Insufficient documentation

## 2018-04-24 DIAGNOSIS — R69 Illness, unspecified: Secondary | ICD-10-CM | POA: Diagnosis not present

## 2018-04-24 DIAGNOSIS — R930 Abnormal findings on diagnostic imaging of skull and head, not elsewhere classified: Secondary | ICD-10-CM | POA: Diagnosis not present

## 2018-04-24 DIAGNOSIS — Z7401 Bed confinement status: Secondary | ICD-10-CM | POA: Diagnosis not present

## 2018-04-24 DIAGNOSIS — Z880 Allergy status to penicillin: Secondary | ICD-10-CM | POA: Insufficient documentation

## 2018-04-24 DIAGNOSIS — E860 Dehydration: Secondary | ICD-10-CM | POA: Insufficient documentation

## 2018-04-24 DIAGNOSIS — Z9013 Acquired absence of bilateral breasts and nipples: Secondary | ICD-10-CM | POA: Insufficient documentation

## 2018-04-24 HISTORY — DX: Other amnesia: R41.3

## 2018-04-24 LAB — CBC WITH DIFFERENTIAL/PLATELET
Abs Immature Granulocytes: 0.04 10*3/uL (ref 0.00–0.07)
Basophils Absolute: 0 10*3/uL (ref 0.0–0.1)
Basophils Relative: 0 %
Eosinophils Absolute: 0 10*3/uL (ref 0.0–0.5)
Eosinophils Relative: 1 %
HCT: 47.4 % — ABNORMAL HIGH (ref 36.0–46.0)
Hemoglobin: 14.4 g/dL (ref 12.0–15.0)
Immature Granulocytes: 1 %
Lymphocytes Relative: 23 %
Lymphs Abs: 1.5 10*3/uL (ref 0.7–4.0)
MCH: 29.8 pg (ref 26.0–34.0)
MCHC: 30.4 g/dL (ref 30.0–36.0)
MCV: 98.1 fL (ref 80.0–100.0)
Monocytes Absolute: 0.5 10*3/uL (ref 0.1–1.0)
Monocytes Relative: 7 %
Neutro Abs: 4.3 10*3/uL (ref 1.7–7.7)
Neutrophils Relative %: 68 %
Platelets: 176 10*3/uL (ref 150–400)
RBC: 4.83 MIL/uL (ref 3.87–5.11)
RDW: 14.3 % (ref 11.5–15.5)
WBC: 6.3 10*3/uL (ref 4.0–10.5)
nRBC: 0 % (ref 0.0–0.2)

## 2018-04-24 LAB — COMPREHENSIVE METABOLIC PANEL
ALT: 14 U/L (ref 0–44)
AST: 25 U/L (ref 15–41)
Albumin: 4 g/dL (ref 3.5–5.0)
Alkaline Phosphatase: 86 U/L (ref 38–126)
Anion gap: 13 (ref 5–15)
BUN: 25 mg/dL — ABNORMAL HIGH (ref 8–23)
CO2: 25 mmol/L (ref 22–32)
Calcium: 9.7 mg/dL (ref 8.9–10.3)
Chloride: 111 mmol/L (ref 98–111)
Creatinine, Ser: 1.08 mg/dL — ABNORMAL HIGH (ref 0.44–1.00)
GFR calc Af Amer: 51 mL/min — ABNORMAL LOW (ref >60)
GFR calc non Af Amer: 44 mL/min — ABNORMAL LOW (ref >60)
Glucose, Bld: 101 mg/dL — ABNORMAL HIGH (ref 70–99)
Potassium: 4.9 mmol/L (ref 3.5–5.1)
Sodium: 149 mmol/L — ABNORMAL HIGH (ref 135–145)
Total Bilirubin: 1.3 mg/dL — ABNORMAL HIGH (ref 0.3–1.2)
Total Protein: 7.6 g/dL (ref 6.5–8.1)

## 2018-04-24 LAB — URINALYSIS, ROUTINE W REFLEX MICROSCOPIC
Glucose, UA: NEGATIVE mg/dL
Hgb urine dipstick: NEGATIVE
Ketones, ur: 80 mg/dL — AB
Leukocytes,Ua: NEGATIVE
Nitrite: NEGATIVE
Protein, ur: 30 mg/dL — AB
Specific Gravity, Urine: 1.027 (ref 1.005–1.030)
pH: 5 (ref 5.0–8.0)

## 2018-04-24 LAB — TROPONIN I: Troponin I: <0.03 ng/mL (ref <0.03)

## 2018-04-24 LAB — AMMONIA: Ammonia: 13 umol/L (ref 9–35)

## 2018-04-24 MED ORDER — LEVOFLOXACIN 500 MG PO TABS: 500.0000 mg | ORAL_TABLET | Freq: Every day | ORAL | 0 refills | Status: DC

## 2018-04-24 MED ORDER — SODIUM CHLORIDE 0.9 % IV BOLUS (SEPSIS)
500.0000 mL | Freq: Once | INTRAVENOUS | Status: AC
  Administered 2018-04-24: 500 mL via INTRAVENOUS

## 2018-04-24 MED ORDER — SODIUM CHLORIDE 0.9 % IV SOLN
1000.0000 mL | INTRAVENOUS | Status: DC
  Administered 2018-04-24: 1000 mL via INTRAVENOUS

## 2018-04-24 MED ORDER — LEVOFLOXACIN IN D5W 500 MG/100ML IV SOLN
500.0000 mg | Freq: Once | INTRAVENOUS | Status: AC
  Administered 2018-04-24: 500 mg via INTRAVENOUS
  Filled 2018-04-24: qty 100

## 2018-04-24 NOTE — ED Triage Notes (Signed)
Lives at home c daughter  Multiple complaints  AMS  "think she has a UTI"  "No BM in 14 days"   No emena, disempaction but has taken ,miralix  Called Dr Lodema Hong but no call back per grandson  Pt makes repetitive remarks

## 2018-04-24 NOTE — ED Notes (Signed)
Ems arrived to pick pt up, instructions given to daughter via phone,

## 2018-04-24 NOTE — ED Provider Notes (Signed)
Medical screening examination/treatment/procedure(s) were conducted as a shared visit with non-physician practitioner(s) and myself.  I personally evaluated the patient during the encounter.  EKG Interpretation  Date/Time:  Sunday April 24 2018 14:01:35 EDT Ventricular Rate:  108 PR Interval:    QRS Duration: 87 QT Interval:  369 QTC Calculation: 429 R Axis:   -13 Text Interpretation:  Sinus tachycardia Multiform ventricular premature complexes LVH with secondary repolarization abnormality Inferior infarct, old Anterior Q waves, possibly due to LVH Baseline wander in multiple leads Confirmed by Raeford Razor [phone]) on 04/24/2018 2:26:39 PM  89yF with AMS. UTI and questionable pneumonia. Levaquin should adequately cover for both possibilities. Mild dehydration. Given IVF. She is afebrile. Nontoxic. o2 sats normal on RA. Given the current climate, I think outpt treatment may be best option. Discussed with her daughter via phone. Encourage fluids. Continue miralax. Return for worsening symptoms. Otherwise FU with Dr Lodema Hong.    Raeford Razor, MD 04/24/18 Ebony Cargo

## 2018-04-24 NOTE — ED Triage Notes (Signed)
Pt is confused and does not follow commands  Is lifted by grandson to stretcher   When asked, grandson states she "usually knows my name"    Pt asks same questions multiple times

## 2018-04-24 NOTE — Discharge Instructions (Signed)
You received a dose of IV antibiotics in the emergency room. The antibiotics is once a day dosing an you do not have to start taking then again until tomorrow (04/25/18).

## 2018-04-24 NOTE — ED Provider Notes (Signed)
[facility] EMERGENCY DEPARTMENT Provider Note   CSN: [id number] Arrival date & time: 04/24/18  1337    History   Chief Complaint Chief Complaint  Patient presents with  . Urinary Frequency  . Constipation  . Altered Mental Status    HPI Secret Jennifer [name] is a 85 y.o. female.     Patient is a 85 year old female who presents to the emergency department with multiple complaints.  The history is obtained from the grandson.  LEVEL V caveat due to altered mental status.  The grandson reports that the patient has been noted recently to have a foul-smelling urine and the family is questioning whether or not she has a urinary tract infection.  There was also concern that the patient was up and about with her walker on last week, now she can hardly get out of bed according to family.  There is been a change in the mental status.  The patient usually recognizes the daughter and grandson, and female she does not recall their name.  The patient has been noted to be repetitive or repeating the same statement over and over again.  There was also concern that had been no bowel movement over the last 7 to 10 days.  There is been no reported fever.  The patient is not swallowing as usual according to family.  This problem is been going on for the last 2 days.  She presents now for evaluation of these multiple problems.     Past Medical History:  Diagnosis Date  . Anemia    iron deficiency post op.  Marland Kitchen. CAD (coronary artery disease) 2008   Cath 40% mid LAD, 35% RCA  . Cellulitis of finger    right   . Chest pain 2006  . Chronic back pain   . COPD (chronic obstructive pulmonary disease) (HCC)   . Gastroesophageal reflux disease   . Hiatal hernia   . Hyperlipidemia   . Memory deficit   . Nicotine addiction   . Osteoarthritis    status post left TKA; surgery on the right is anticipated in the near future   . Pulmonary nodules    stable since 2006  . Shingles    right breast   . Skin  infection   . Ulcer    gential  . Use of cane as ambulatory aid     Patient Active Problem List   Diagnosis Date Noted  . Shingles (herpes zoster) polyneuropathy 03/11/2018  . Advanced care planning/counseling discussion   . Palliative care by specialist   . Goals of care, counseling/discussion   . Altered mental status 01/24/2018  . Frequent falls 01/24/2018  . Burnout of caregiver 07/21/2016  . DNR no code (do not resuscitate) 04/25/2014  . Urinary incontinence 04/04/2013  . Osteoporosis, post-menopausal 12/06/2012  . At high risk for falls 07/30/2012  . Dementia without behavioral disturbance (HCC) 08/19/2011  . COPD (chronic obstructive pulmonary disease) (HCC) 06/25/2011  . Allergic rhinitis 12/23/2010  . CEREBROVASCULAR DISEASE 08/05/2009  . Spinal stenosis 06/09/2007  . Coronary atherosclerosis 01/25/2007  . HIATAL HERNIA WITH REFLUX 01/25/2007  . Osteoarthritis 01/25/2007    Past Surgical History:  Procedure Laterality Date  . ABDOMINAL HYSTERECTOMY    . BREAST SURGERY N/A    bilateral  . CARDIAC CATHETERIZATION  2008   Mid LAD 40%, RCA 25%.   . CHOLECYSTECTOMY    . COMBINED HYSTERECTOMY ABDOMINAL W/ A&P REPAIR / OOPHORECTOMY  approx. 40 years ago   . EYE SURGERY    .  MASTECTOMY  1998 & 2007    right -1998 / left 2007  . right knee replacement  09/2009   Dr. Romeo Apple  . TOTAL KNEE ARTHROPLASTY  4/08   left      OB History    Gravida      Para      Term      Preterm      AB      Living  1     SAB      TAB      Ectopic      Multiple      Live Births               Home Medications    Prior to Admission medications   Medication Sig Start Date End Date Taking? Authorizing Provider  acetaminophen (TYLENOL) 500 MG tablet Take 1,000 mg by mouth 2 (two) times daily. *May take an additional dose if needed for pain    [provider]  celecoxib (CELEBREX) 100 MG capsule TAKE ONE CAPSULE BY MOUTH EVERY DAY Patient taking  differently: Take 100 mg by mouth daily.  09/20/17   [name], MD  doxycycline (VIBRA-TABS) 100 MG tablet Take 1 tablet (100 mg total) by mouth 2 (two) times daily. 03/02/18   [name], MD  fluticasone (FLONASE) 50 MCG/ACT nasal spray Place 1 spray into both nostrils daily for 30 days. 01/29/18 02/28/18  Sherryll Burger, Pratik D, DO  lidocaine (LIDODERM) 5 % Place 1 patch onto the skin daily. Remove & Discard patch within 12 hours or as directed by MD 03/02/18   [name], MD  loperamide (IMODIUM) 2 MG capsule Take 2 mg by mouth daily.    [provider]  oxybutynin (DITROPAN-XL) 5 MG 24 hr tablet TAKE 1 TABLET BY MOUTH EVERY NIGHT AT BEDTIME Patient taking differently: Take 5 mg by mouth at bedtime.  01/11/18   [name], MD  traMADol (ULTRAM) 50 MG tablet Take 1 tablet (50 mg total) by mouth 2 (two) times daily as needed for moderate pain (take 45-60 minutes prior to PT). 04/07/18   Freddy Finner, NP    Family History Family History  Problem Relation Age of Onset  . Cancer Mother 20       breast   . Cancer Father 29       prostate  . Hypertension Father   . Hypertension Sister   . Heart disease Sister   . Heart disease Sister   . Heart disease Sister     Social History Social History   Tobacco Use  . Smoking status: Former Smoker    Packs/day: 0.25    Years: 70.00    Pack years: 17.50    Types: Cigarettes    Last attempt to quit: 03/13/2015    Years since quitting: 3.1  . Smokeless tobacco: Never Used  Substance Use Topics  . Alcohol use: No    Alcohol/week: 0.0 standard drinks  . Drug use: No     Allergies   Aricept [donepezil hcl]; Iohexol; Iron; and Penicillins   Review of Systems Review of Systems  Constitutional: Positive for activity change and appetite change. Negative for diaphoresis and fever.       All ROS Neg except as noted in HPI  HENT: Negative for nosebleeds.   Eyes: Negative for photophobia and discharge.   Respiratory: Negative for cough, shortness of breath and wheezing.   Cardiovascular: Negative for chest pain and palpitations.  Gastrointestinal: Positive for constipation. Negative for abdominal pain and blood in stool.  Genitourinary: Positive for frequency. Negative for dysuria and hematuria.  Musculoskeletal: Negative for arthralgias, back pain and neck pain.  Skin: Negative.   Neurological: Negative for dizziness, seizures and speech difficulty.  Psychiatric/Behavioral: Positive for confusion. Negative for hallucinations.     Physical Exam Updated Vital Signs Ht 5\' 4"  (1.626 m)   Wt 98.9 kg   BMI 37.43 kg/m   Physical Exam Vitals signs and nursing note reviewed.  Constitutional:      Appearance: She is well-developed. She is not toxic-appearing.  HENT:     Head: Normocephalic.     Right Ear: Tympanic membrane and external ear normal.     Left Ear: Tympanic membrane and external ear normal.     Mouth/Throat:     Comments: Oral mucous membranes are dry. Eyes:     General: Lids are normal.     Pupils: Pupils are equal, round, and reactive to light.  Neck:     Musculoskeletal: Normal range of motion and neck supple.     Vascular: No carotid bruit.  Cardiovascular:     Rate and Rhythm: Normal rate and regular rhythm.     Pulses: Normal pulses.     Heart sounds: Murmur present. Systolic murmur present with a grade of 2/6.  Pulmonary:     Effort: No accessory muscle usage, respiratory distress or retractions.     Breath sounds: Rhonchi present.     Comments: Decrease in breath sounds on the right compared to the left. Abdominal:     General: Bowel sounds are normal.     Palpations: Abdomen is soft.     Tenderness: There is no abdominal tenderness. There is no guarding.  Musculoskeletal: Normal range of motion.  Lymphadenopathy:     Head:     Right side of head: No submandibular adenopathy.     Left side of head: No submandibular adenopathy.     Cervical: No cervical  adenopathy.  Skin:    General: Skin is warm and dry.     Comments: Poor to borderline skin turgor.  Neurological:     Mental Status: She is alert and oriented to person, place, and time.     Sensory: No sensory deficit.     Comments: Grip is symmetrical.  Patient unable to follow instructions to examine the lower extremities. Patient unable to cooperate with coordination testing.  Psychiatric:        Speech: Speech normal.      ED Treatments / Results  Labs (all labs ordered are listed, but only abnormal results are displayed) Labs Reviewed - No data to display  EKG None  Radiology No results found.  Procedures Procedures (including critical care time)  Medications Ordered in ED Medications - No data to display   Initial Impression / Assessment and Plan / ED Course  I have reviewed the triage vital signs and the nursing notes.  Pertinent labs & imaging results that were available during my care of the patient were reviewed by me and considered in my medical decision making (see chart for details).          Final Clinical Impressions(s) / ED Diagnoses MDM  Vital signs wnl. Pulse Ox 99% on room air. Patient is awake and alert.  Does not appear to be in distress.  Comprehensive metabolic panel shows the BUN to be elevated at 25 with a creatinine elevated at 1.08.  Patient has previous elevations  in the BUN and creatinine from review of previous records.  The sodium is elevated at 149.  Patient may be mildly dry with some dehydration.  IV fluids started.  The troponin is less than 0.03.  The electrocardiogram was mostly unchanged from previous electrocardiograms and does not show evidence of an acute STEMI. Complete blood count was within normal limits.  The urine analysis showed a cloudy amber specimen with specific gravity 1.027.  There are 80 units of ketones and 30 units of protein present.  The nitrates and the leukocyte esterase were negative however the  microscopic view showed a 11-20 red blood cells and 11-20 white blood cells.  A culture has been sent to the lab.  Chest x-ray shows right upper lobe airspace opacity concerning for possible pneumonia.  Patient started on IV Levaquin. I attempted to discuss the findings with the patient's grandson, however he is out of the room at the moment.  We will try again later.  CT scan of the head pending.  Patient's care to be continued by Dr. Juleen China.   Final diagnoses:  Lower urinary tract infectious disease  Dehydration  Community acquired pneumonia of right upper lobe of lung [facility])    ED Discharge Orders         Ordered    levofloxacin (LEVAQUIN) 500 MG tablet  Daily     04/24/18 1902           Ivery Quale, PA-C 04/25/18 1610    Raeford Razor, MD 05/02/18 1221

## 2018-04-25 ENCOUNTER — Telehealth (HOSPITAL_BASED_OUTPATIENT_CLINIC_OR_DEPARTMENT_OTHER): Payer: Self-pay | Admitting: Emergency Medicine

## 2018-04-25 LAB — BLOOD CULTURE ID PANEL (REFLEXED)

## 2018-04-25 LAB — URINE CULTURE

## 2018-04-26 ENCOUNTER — Encounter (HOSPITAL_COMMUNITY): Payer: Self-pay

## 2018-04-26 ENCOUNTER — Telehealth: Payer: Self-pay | Admitting: *Deleted

## 2018-04-26 ENCOUNTER — Emergency Department (HOSPITAL_COMMUNITY): Payer: Medicare Other

## 2018-04-26 ENCOUNTER — Inpatient Hospital Stay (HOSPITAL_COMMUNITY)
Admission: EM | Admit: 2018-04-26 | Discharge: 2018-04-29 | DRG: 641 | Disposition: A | Discharge status: Skilled Nursing Facility | Payer: Medicare Other | Attending: Internal Medicine | Admitting: Internal Medicine

## 2018-04-26 ENCOUNTER — Other Ambulatory Visit: Payer: Self-pay

## 2018-04-26 DIAGNOSIS — M150 Primary generalized (osteo)arthritis: Secondary | ICD-10-CM

## 2018-04-26 DIAGNOSIS — R627 Adult failure to thrive: Secondary | ICD-10-CM | POA: Diagnosis not present

## 2018-04-26 DIAGNOSIS — G301 Alzheimer's disease with late onset: Secondary | ICD-10-CM

## 2018-04-26 DIAGNOSIS — R7881 Bacteremia: Secondary | ICD-10-CM

## 2018-04-26 DIAGNOSIS — I10 Essential (primary) hypertension: Secondary | ICD-10-CM | POA: Diagnosis present

## 2018-04-26 DIAGNOSIS — G9349 Other encephalopathy: Secondary | ICD-10-CM | POA: Diagnosis not present

## 2018-04-26 DIAGNOSIS — G8929 Other chronic pain: Secondary | ICD-10-CM | POA: Diagnosis present

## 2018-04-26 DIAGNOSIS — M159 Polyosteoarthritis, unspecified: Secondary | ICD-10-CM

## 2018-04-26 DIAGNOSIS — F0280 Dementia in other diseases classified elsewhere without behavioral disturbance: Secondary | ICD-10-CM | POA: Diagnosis not present

## 2018-04-26 DIAGNOSIS — G9340 Encephalopathy, unspecified: Secondary | ICD-10-CM | POA: Diagnosis not present

## 2018-04-26 DIAGNOSIS — M810 Age-related osteoporosis without current pathological fracture: Secondary | ICD-10-CM | POA: Diagnosis present

## 2018-04-26 DIAGNOSIS — Z79899 Other long term (current) drug therapy: Secondary | ICD-10-CM

## 2018-04-26 DIAGNOSIS — H04129 Dry eye syndrome of unspecified lacrimal gland: Secondary | ICD-10-CM | POA: Diagnosis present

## 2018-04-26 DIAGNOSIS — E860 Dehydration: Secondary | ICD-10-CM | POA: Diagnosis not present

## 2018-04-26 DIAGNOSIS — Z96653 Presence of artificial knee joint, bilateral: Secondary | ICD-10-CM | POA: Diagnosis present

## 2018-04-26 DIAGNOSIS — Z791 Long term (current) use of non-steroidal anti-inflammatories (NSAID): Secondary | ICD-10-CM

## 2018-04-26 DIAGNOSIS — J449 Chronic obstructive pulmonary disease, unspecified: Secondary | ICD-10-CM | POA: Diagnosis not present

## 2018-04-26 DIAGNOSIS — R5381 Other malaise: Secondary | ICD-10-CM | POA: Diagnosis not present

## 2018-04-26 DIAGNOSIS — Z66 Do not resuscitate: Secondary | ICD-10-CM | POA: Diagnosis present

## 2018-04-26 DIAGNOSIS — Z87891 Personal history of nicotine dependence: Secondary | ICD-10-CM

## 2018-04-26 DIAGNOSIS — R0689 Other abnormalities of breathing: Secondary | ICD-10-CM | POA: Diagnosis not present

## 2018-04-26 DIAGNOSIS — R079 Chest pain, unspecified: Secondary | ICD-10-CM | POA: Diagnosis not present

## 2018-04-26 DIAGNOSIS — R52 Pain, unspecified: Secondary | ICD-10-CM | POA: Diagnosis not present

## 2018-04-26 DIAGNOSIS — R239 Unspecified skin changes: Secondary | ICD-10-CM | POA: Diagnosis not present

## 2018-04-26 DIAGNOSIS — R5383 Other fatigue: Secondary | ICD-10-CM | POA: Diagnosis not present

## 2018-04-26 DIAGNOSIS — F0390 Unspecified dementia without behavioral disturbance: Secondary | ICD-10-CM | POA: Diagnosis not present

## 2018-04-26 DIAGNOSIS — I2510 Atherosclerotic heart disease of native coronary artery without angina pectoris: Secondary | ICD-10-CM | POA: Diagnosis present

## 2018-04-26 DIAGNOSIS — Z880 Allergy status to penicillin: Secondary | ICD-10-CM

## 2018-04-26 DIAGNOSIS — M549 Dorsalgia, unspecified: Secondary | ICD-10-CM | POA: Diagnosis present

## 2018-04-26 DIAGNOSIS — R030 Elevated blood-pressure reading, without diagnosis of hypertension: Secondary | ICD-10-CM | POA: Diagnosis not present

## 2018-04-26 DIAGNOSIS — E876 Hypokalemia: Secondary | ICD-10-CM | POA: Diagnosis not present

## 2018-04-26 DIAGNOSIS — Z6837 Body mass index (BMI) 37.0-37.9, adult: Secondary | ICD-10-CM

## 2018-04-26 DIAGNOSIS — R29810 Facial weakness: Secondary | ICD-10-CM | POA: Diagnosis present

## 2018-04-26 DIAGNOSIS — Z888 Allergy status to other drugs, medicaments and biological substances status: Secondary | ICD-10-CM

## 2018-04-26 DIAGNOSIS — R109 Unspecified abdominal pain: Secondary | ICD-10-CM | POA: Diagnosis not present

## 2018-04-26 DIAGNOSIS — M170 Bilateral primary osteoarthritis of knee: Secondary | ICD-10-CM | POA: Diagnosis present

## 2018-04-26 DIAGNOSIS — J309 Allergic rhinitis, unspecified: Secondary | ICD-10-CM | POA: Diagnosis present

## 2018-04-26 DIAGNOSIS — Z91041 Radiographic dye allergy status: Secondary | ICD-10-CM

## 2018-04-26 DIAGNOSIS — R1310 Dysphagia, unspecified: Secondary | ICD-10-CM | POA: Diagnosis present

## 2018-04-26 LAB — COMPREHENSIVE METABOLIC PANEL
ALT: 12 U/L (ref 0–44)
AST: 19 U/L (ref 15–41)
Albumin: 3.7 g/dL (ref 3.5–5.0)
Alkaline Phosphatase: 87 U/L (ref 38–126)
Anion gap: 12 (ref 5–15)
BUN: 26 mg/dL — ABNORMAL HIGH (ref 8–23)
CO2: 22 mmol/L (ref 22–32)
Calcium: 8.9 mg/dL (ref 8.9–10.3)
Chloride: 110 mmol/L (ref 98–111)
Creatinine, Ser: 0.78 mg/dL (ref 0.44–1.00)
GFR calc Af Amer: >60 mL/min (ref >60)
GFR calc non Af Amer: >60 mL/min (ref >60)
Glucose, Bld: 82 mg/dL (ref 70–99)
Potassium: 3.5 mmol/L (ref 3.5–5.1)
Sodium: 144 mmol/L (ref 135–145)
Total Bilirubin: 0.9 mg/dL (ref 0.3–1.2)
Total Protein: 7.2 g/dL (ref 6.5–8.1)

## 2018-04-26 LAB — URINALYSIS, ROUTINE W REFLEX MICROSCOPIC
Bilirubin Urine: NEGATIVE
Glucose, UA: NEGATIVE mg/dL
Hgb urine dipstick: NEGATIVE
Ketones, ur: 20 mg/dL — AB
Leukocytes,Ua: NEGATIVE
Nitrite: NEGATIVE
Protein, ur: 30 mg/dL — AB
Specific Gravity, Urine: 1.028 (ref 1.005–1.030)
pH: 5 (ref 5.0–8.0)

## 2018-04-26 LAB — CULTURE, BLOOD (ROUTINE X 2): Special Requests: ADEQUATE

## 2018-04-26 LAB — CBC WITH DIFFERENTIAL/PLATELET
Abs Immature Granulocytes: 0.03 10*3/uL (ref 0.00–0.07)
Basophils Absolute: 0 10*3/uL (ref 0.0–0.1)
Basophils Relative: 1 %
Eosinophils Absolute: 0.2 10*3/uL (ref 0.0–0.5)
Eosinophils Relative: 3 %
HCT: 43.9 % (ref 36.0–46.0)
Hemoglobin: 13.5 g/dL (ref 12.0–15.0)
Immature Granulocytes: 0 %
Lymphocytes Relative: 24 %
Lymphs Abs: 1.7 10*3/uL (ref 0.7–4.0)
MCH: 30.2 pg (ref 26.0–34.0)
MCHC: 30.8 g/dL (ref 30.0–36.0)
MCV: 98.2 fL (ref 80.0–100.0)
Monocytes Absolute: 0.5 10*3/uL (ref 0.1–1.0)
Monocytes Relative: 8 %
Neutro Abs: 4.5 10*3/uL (ref 1.7–7.7)
Neutrophils Relative %: 64 %
Platelets: 164 10*3/uL (ref 150–400)
RBC: 4.47 MIL/uL (ref 3.87–5.11)
RDW: 14.6 % (ref 11.5–15.5)
WBC: 7 10*3/uL (ref 4.0–10.5)
nRBC: 0 % (ref 0.0–0.2)

## 2018-04-26 LAB — TROPONIN I: Troponin I: <0.03 ng/mL (ref <0.03)

## 2018-04-26 LAB — VITAMIN B12: Vitamin B-12: 871 pg/mL (ref 180–914)

## 2018-04-26 LAB — LACTIC ACID, PLASMA: Lactic Acid, Venous: 1.3 mmol/L (ref 0.5–1.9)

## 2018-04-26 LAB — TSH: TSH: 1.041 u[IU]/mL (ref 0.350–4.500)

## 2018-04-26 MED ORDER — VANCOMYCIN HCL 10 G IV SOLR
1500.0000 mg | INTRAVENOUS | Status: DC
  Filled 2018-04-26: qty 1500

## 2018-04-26 MED ORDER — ACETAMINOPHEN 325 MG PO TABS
650.0000 mg | ORAL_TABLET | Freq: Four times a day (QID) | ORAL | Status: DC | PRN
  Filled 2018-04-26: qty 2

## 2018-04-26 MED ORDER — ACETAMINOPHEN 650 MG RE SUPP: 650.0000 mg | Freq: Four times a day (QID) | RECTAL | Status: DC | PRN

## 2018-04-26 MED ORDER — ONDANSETRON HCL 4 MG/2ML IJ SOLN: 4.0000 mg | Freq: Four times a day (QID) | INTRAMUSCULAR | Status: DC | PRN

## 2018-04-26 MED ORDER — VANCOMYCIN HCL IN DEXTROSE 1-5 GM/200ML-% IV SOLN
1000.0000 mg | INTRAVENOUS | Status: AC
  Administered 2018-04-26: 1000 mg via INTRAVENOUS
  Filled 2018-04-26 (×2): qty 200

## 2018-04-26 MED ORDER — ONDANSETRON HCL 4 MG PO TABS: 4.0000 mg | ORAL_TABLET | Freq: Four times a day (QID) | ORAL | Status: DC | PRN

## 2018-04-26 MED ORDER — HYDRALAZINE HCL 20 MG/ML IJ SOLN: 10.0000 mg | INTRAMUSCULAR | Status: DC | PRN

## 2018-04-26 MED ORDER — IBUPROFEN 400 MG PO TABS: 400.0000 mg | ORAL_TABLET | Freq: Four times a day (QID) | ORAL | Status: DC | PRN

## 2018-04-26 MED ORDER — SODIUM CHLORIDE 0.9 % IV SOLN
INTRAVENOUS | Status: AC
  Administered 2018-04-26: 22:00:00 via INTRAVENOUS

## 2018-04-26 MED ORDER — LIDOCAINE 5 % EX PTCH
1.0000 | MEDICATED_PATCH | CUTANEOUS | Status: DC
  Administered 2018-04-27 – 2018-04-29 (×3): 1 via TRANSDERMAL
  Filled 2018-04-26 (×3): qty 1

## 2018-04-26 MED ORDER — SODIUM CHLORIDE 0.9 % IV SOLN
INTRAVENOUS | Status: DC
  Administered 2018-04-26: 20:00:00 via INTRAVENOUS

## 2018-04-26 MED ORDER — DICLOFENAC SODIUM 1 % TD GEL
2.0000 g | Freq: Four times a day (QID) | TRANSDERMAL | Status: DC
  Administered 2018-04-26 – 2018-04-29 (×11): 2 g via TOPICAL
  Filled 2018-04-26 (×2): qty 100

## 2018-04-26 MED ORDER — ALBUTEROL SULFATE (2.5 MG/3ML) 0.083% IN NEBU: 3.0000 mL | INHALATION_SOLUTION | Freq: Four times a day (QID) | RESPIRATORY_TRACT | Status: DC | PRN

## 2018-04-26 MED ORDER — ENOXAPARIN SODIUM 40 MG/0.4ML ~~LOC~~ SOLN
40.0000 mg | SUBCUTANEOUS | Status: DC
  Administered 2018-04-26 – 2018-04-28 (×3): 40 mg via SUBCUTANEOUS
  Filled 2018-04-26 (×3): qty 0.4

## 2018-04-26 MED ORDER — BISACODYL 5 MG PO TBEC: 5.0000 mg | DELAYED_RELEASE_TABLET | Freq: Every day | ORAL | Status: DC | PRN

## 2018-04-26 MED ORDER — POLYETHYLENE GLYCOL 3350 17 G PO PACK: 17.0000 g | PACK | Freq: Every day | ORAL | Status: DC | PRN

## 2018-04-26 NOTE — Care Management Obs Status (Signed)
MEDICARE OBSERVATION STATUS NOTIFICATION   Patient Details  Name: Jennifer [name] MRN: [id number] Date of Birth: May 14, 1922   Medicare Observation Status Notification Given:  Other (see comment)(Pt not oriented to place and time. Observation status information provided to emergency contact via telephone. )    Carlynn Leduc Sherryle Lis, LCSW 04/26/2018, 8:53 PM

## 2018-04-26 NOTE — ED Notes (Signed)
Patient's daughter called for update. Told daughter we were currently working on getting patient admitted and that the provider may call and obtain information from her.

## 2018-04-26 NOTE — ED Triage Notes (Signed)
Pt brought to ED by [facility] for positive blood cultures. Per EMS, pt has had no fever per daughter just lethargic.

## 2018-04-26 NOTE — Progress Notes (Signed)
Pharmacy Antibiotic Note  Jennifer [name] is a 85 y.o. female admitted on 04/26/2018 with bacteremia.  Pharmacy has been consulted for Vancomycin dosing.  Patient had coag neg staph in blood culture on 4/12 which is likely contaminant but patient lethargic with no clear cause so repeat cultures being done.  Plan: Vancomycin 1500 mg IV every 24 hours.  Goal trough 15-20 mcg/mL.  Follow-up new cultures as coag negative staph is likely a contaminant.  Height: 5\' 4"  (162.6 cm) Weight: 218 lb 0.6 oz (98.9 kg) IBW/kg (Calculated) : 54.7  Temp (24hrs), Avg:98.1 F (36.7 C), Min:97.8 F (36.6 C), Max:98.4 F (36.9 C)  Recent Labs  Lab 04/24/18 1452 04/26/18 1440  WBC 6.3 7.0  CREATININE 1.08* 0.78  LATICACIDVEN  --  1.3    Estimated Creatinine Clearance: 49.1 mL/min (by C-G formula based on SCr of 0.78 mg/dL).    Allergies  Allergen Reactions  . Aricept [Donepezil Hcl] Other (See Comments)    confusion  . Iohexol      Desc: PT ALLERGIC TO CONTRAST- SHE CAN'T BREATH   . Iron   . Penicillins     Has patient had a PCN reaction causing immediate rash, facial/tongue/throat swelling, SOB or lightheadedness with hypotension:  unknown Has patient had a PCN reaction causing severe rash involving mucus membranes or skin necrosis: unknown Has patient had a PCN reaction that required hospitalization unknown Has patient had a PCN reaction occurring within the last 10 years: unknown If all of the above answers are "NO", then may proceed with Cephalosporin use.     Antimicrobials this admission: Vanco 4/14 >  Microbiology results: 4/14 BCx: pending 4/12 BCx: coag neg staph 4/14 UCx: pending    Thank you for allowing pharmacy to be a part of this patient's care.  Tad Moore 04/26/2018 9:10 PM

## 2018-04-26 NOTE — Telephone Encounter (Signed)
Pts daughter Jennifer [name] called concerned as her mother had been in the ER (she does have follow up app 4-16). The ER called to tell her she had some bacteria in her urine and they wanted her to bring her back in however she would have to get EMS to take her mother. They recommended she follow up with her primary Dr. So she is reaching out expecting a call back.

## 2018-04-26 NOTE — Telephone Encounter (Signed)
Spoke with Ms. Waldo Laine she stated Ms. Reyburn was already transported to the ER today

## 2018-04-26 NOTE — TOC Initial Note (Signed)
Transition of Care [facility]) - Initial/Assessment Note    Patient Details  Name: Jennifer [name] MRN: [id number] Date of Birth: July 29, 1922  Transition of Care [facility]) CM/SW Contact:    Elissia Spiewak Sherryle Lis, LCSW Phone Number: 04/26/2018, 10:26 PM  Clinical Narrative:    CSW contacted Pt's daughter [name],  Ph: [phone]. Pt's daughter reports that Pt has been staying with her since 2012. Pt's daughter, [name] goes into detail and explains that Pt receives total care in a home setting provided by her.   Pt has the following DME in her home to assist with ambulation, toileting, and bathing: standard walker, shower chair, hospital bed, raised toilet seat and hoyer lift.   Pt's daughter verbalizes that while at home, Pt is unable to ambulate and is also incontinent. Pt wears adult diapers while at home.               CSW inquired about Pt's status and orientation. Pt's daughter explains that although Pt has dementia, she is usually oriented to person, place and situation; but as of recently, Pt is "somewhere else", and talks to her deceased loved ones.   Pt's daughter expresses that she is exhausted and could benefit from respite/in home-aid services.    Expected Discharge Plan: Home w Home Health Services     Patient Goals and CMS Choice Patient states their goals for this hospitalization and ongoing recovery are:: Pt's daughter expressed that her goal was for Pt to return home under her care CMS Medicare.gov Compare Post Acute Care list provided to:: Patient Represenative (must comment) Choice offered to / list presented to : Adult Children  Expected Discharge Plan and Services Expected Discharge Plan: Home w Home Health Services In-house Referral: Clinical Social Work(Pt's daugter interested in in-home aid services )     Living arrangements for the past 2 months: Single Family Home(under the care and supervision of her daughter [name] )                           Prior Living Arrangements/Services Living arrangements for the past 2 months: Single Family Home(under the care and supervision of her daughter [name] ) Lives with:: Adult Children Patient language and need for interpreter reviewed:: Yes Do you feel safe going back to the place where you live?: Yes      Need for Family Participation in Patient Care: Yes (Comment) Care giver support system in place?: Yes (comment)   Criminal Activity/Legal Involvement Pertinent to Current Situation/Hospitalization: No - Comment as needed  Activities of Daily Living Home Assistive Devices/Equipment: Environmental consultant (specify type), Shower chair without back, Dentures (specify type) ADL Screening (condition at time of admission) Patient's cognitive ability adequate to safely complete daily activities?: No Is the patient deaf or have difficulty hearing?: Yes Does the patient have difficulty seeing, even when wearing glasses/contacts?: Yes Does the patient have difficulty concentrating, remembering, or making decisions?: Yes Patient able to express need for assistance with ADLs?: No Does the patient have difficulty dressing or bathing?: Yes Independently performs ADLs?: No Communication: Independent Dressing (OT): Needs assistance Is this a change from baseline?: Pre-admission baseline Grooming: Needs assistance Is this a change from baseline?: Pre-admission baseline Feeding: Needs assistance Is this a change from baseline?: Pre-admission baseline Bathing: Needs assistance Is this a change from baseline?: Pre-admission baseline Toileting: Needs assistance Is this a change from baseline?: Pre-admission baseline In/Out Bed: Needs assistance Is this a change from baseline?: Pre-admission baseline  Walks in Home: Needs assistance Is this a change from baseline?: Pre-admission baseline Does the patient have difficulty walking or climbing stairs?: Yes Weakness of Legs: Both Weakness of Arms/Hands:  None  Permission Sought/Granted Permission sought to share information with : Guardian                Emotional Assessment   Attitude/Demeanor/Rapport: Unable to Assess Affect (typically observed): Unable to Assess Orientation: : Oriented to Self   Psych Involvement: No (comment)  Admission diagnosis:  Pain [R52] Acute encephalopathy [G93.40] Patient Active Problem List   Diagnosis Date Noted  . Acute encephalopathy 04/26/2018  . Elevated blood pressure, situational 04/26/2018  . Positive blood culture 04/26/2018  . Shingles (herpes zoster) polyneuropathy 03/11/2018  . Advanced care planning/counseling discussion   . Palliative care by specialist   . Goals of care, counseling/discussion   . Altered mental status 01/24/2018  . Frequent falls 01/24/2018  . Burnout of caregiver 07/21/2016  . DNR no code (do not resuscitate) 04/25/2014  . Urinary incontinence 04/04/2013  . Osteoporosis, post-menopausal 12/06/2012  . At high risk for falls 07/30/2012  . Dementia without behavioral disturbance (HCC) 08/19/2011  . COPD (chronic obstructive pulmonary disease) (HCC) 06/25/2011  . Allergic rhinitis 12/23/2010  . CEREBROVASCULAR DISEASE 08/05/2009  . Spinal stenosis 06/09/2007  . Coronary atherosclerosis 01/25/2007  . HIATAL HERNIA WITH REFLUX 01/25/2007  . Osteoarthritis 01/25/2007   PCP:  Kerri PerchesSimpson, Margaret E, MD Pharmacy:   [facility] [phone] - [place], [place] - [address] [place] [id number] Phone: [phone] Fax: [phone]     Social Determinants of Health (SDOH) Interventions    Readmission Risk Interventions No flowsheet data found.

## 2018-04-26 NOTE — ED Provider Notes (Signed)
[facility] EMERGENCY DEPARTMENT Provider Note   CSN: [id number] Arrival date & time: 04/26/18  1249    History   Chief Complaint Chief Complaint  Patient presents with  . Abnormal Lab    HPI Jennifer [name] is a 85 y.o. female.     The history is provided by the EMS personnel and a relative. The history is limited by the condition of the patient (Hx dementia).  Abnormal Lab   Pt was seen at 1310. Per EMS and pt's family report: Pt sent to ED today for "positive blood culture" from ED visit 04/24/2018.  Pt was seen in ED 04/24/18 for foul smelling urine, decreased PO intake, increased confusion from baseline, increased sleeping and generalized weakness. Pt apparently is usually ambulatory, but has been unable to walk for the past several days due to generalized weakness. Pt's family states pt has not had a BM in "a week and a 1/2."  Pt was dx dehydration, UTI, CAP; given IVF and rx levaquin. Pt's family states pt continues to be "lethargic," and "isn't getting better." Denies fevers. No reported cough, vomiting/diarrhea, SOB, CP, abd pain, falls, focal motor weakness, rash.    Past Medical History:  Diagnosis Date  . Anemia    iron deficiency post op.  Marland Kitchen CAD (coronary artery disease) 2008   Cath 40% mid LAD, 35% RCA  . Cellulitis of finger    right   . Chest pain 2006  . Chronic back pain   . COPD (chronic obstructive pulmonary disease) (HCC)   . Gastroesophageal reflux disease   . Hiatal hernia   . Hyperlipidemia   . Memory deficit   . Nicotine addiction   . Osteoarthritis    status post left TKA; surgery on the right is anticipated in the near future   . Pulmonary nodules    stable since 2006  . Shingles    right breast   . Skin infection   . Ulcer    gential  . Use of cane as ambulatory aid     Patient Active Problem List   Diagnosis Date Noted  . Shingles (herpes zoster) polyneuropathy 03/11/2018  . Advanced care planning/counseling discussion   . Palliative  care by specialist   . Goals of care, counseling/discussion   . Altered mental status 01/24/2018  . Frequent falls 01/24/2018  . Burnout of caregiver 07/21/2016  . DNR no code (do not resuscitate) 04/25/2014  . Urinary incontinence 04/04/2013  . Osteoporosis, post-menopausal 12/06/2012  . At high risk for falls 07/30/2012  . Dementia without behavioral disturbance (HCC) 08/19/2011  . COPD (chronic obstructive pulmonary disease) (HCC) 06/25/2011  . Allergic rhinitis 12/23/2010  . CEREBROVASCULAR DISEASE 08/05/2009  . Spinal stenosis 06/09/2007  . Coronary atherosclerosis 01/25/2007  . HIATAL HERNIA WITH REFLUX 01/25/2007  . Osteoarthritis 01/25/2007    Past Surgical History:  Procedure Laterality Date  . ABDOMINAL HYSTERECTOMY    . BREAST SURGERY N/A    bilateral  . CARDIAC CATHETERIZATION  2008   Mid LAD 40%, RCA 25%.   . CHOLECYSTECTOMY    . COMBINED HYSTERECTOMY ABDOMINAL W/ A&P REPAIR / OOPHORECTOMY  approx. 40 years ago   . EYE SURGERY    . MASTECTOMY  1998 & 2007    right -1998 / left 2007  . right knee replacement  09/2009   Dr. Romeo Apple  . TOTAL KNEE ARTHROPLASTY  4/08   left      OB History    Gravida  Para      Term      Preterm      AB      Living  1     SAB      TAB      Ectopic      Multiple      Live Births               Home Medications    Prior to Admission medications   Medication Sig Start Date End Date Taking? Authorizing Provider  acetaminophen (TYLENOL) 500 MG tablet Take 1,000 mg by mouth 2 (two) times daily. *May take an additional dose if needed for pain    [provider]  celecoxib (CELEBREX) 100 MG capsule TAKE ONE CAPSULE BY MOUTH EVERY DAY Patient taking differently: Take 100 mg by mouth daily.  09/20/17   [name], MD  doxycycline (VIBRA-TABS) 100 MG tablet Take 1 tablet (100 mg total) by mouth 2 (two) times daily. 03/02/18   [name], MD  fluticasone (FLONASE) 50 MCG/ACT nasal  spray Place 1 spray into both nostrils daily for 30 days. 01/29/18 04/24/18  Sherryll Burger, Pratik D, DO  gabapentin (NEURONTIN) 100 MG capsule Take 100 mg by mouth daily.    [provider]  levofloxacin (LEVAQUIN) 500 MG tablet Take 1 tablet (500 mg total) by mouth daily. 04/24/18   Raeford Razor, MD  lidocaine (LIDODERM) 5 % Place 1 patch onto the skin daily. Remove & Discard patch within 12 hours or as directed by MD 03/02/18   [name], MD  loperamide (IMODIUM) 2 MG capsule Take 2 mg by mouth daily.    [provider]  oxybutynin (DITROPAN-XL) 5 MG 24 hr tablet TAKE 1 TABLET BY MOUTH EVERY NIGHT AT BEDTIME Patient taking differently: Take 5 mg by mouth at bedtime.  01/11/18   [name], MD  Polyethyl Glycol-Propyl Glycol (SYSTANE) 0.4-0.3 % SOLN Apply 1 drop to eye every 6 (six) hours as needed (dry eyes).     [provider]  traMADol (ULTRAM) 50 MG tablet Take 1 tablet (50 mg total) by mouth 2 (two) times daily as needed for moderate pain (take 45-60 minutes prior to PT). Patient not taking: Reported on 04/24/2018 04/07/18   Freddy Finner, NP    Family History Family History  Problem Relation Age of Onset  . Cancer Mother 60       breast   . Cancer Father 40       prostate  . Hypertension Father   . Hypertension Sister   . Heart disease Sister   . Heart disease Sister   . Heart disease Sister     Social History Social History   Tobacco Use  . Smoking status: Former Smoker    Packs/day: 0.25    Years: 70.00    Pack years: 17.50    Types: Cigarettes    Last attempt to quit: 03/13/2015    Years since quitting: 3.1  . Smokeless tobacco: Never Used  Substance Use Topics  . Alcohol use: No    Alcohol/week: 0.0 standard drinks  . Drug use: No     Allergies   Aricept [donepezil hcl]; Iohexol; Iron; and Penicillins   Review of Systems Review of Systems  Unable to perform ROS: Dementia     Physical Exam Updated Vital Signs BP (!)  157/83 (BP Location: Right Arm)   Pulse 71   Temp 98.4 F (36.9 C) (Oral)   Resp  18   Ht  (1.626 m)   Wt 98.9 kg   SpO2 98%   BMI 37.43 kg/m    Patient Vitals for the past 24 hrs:  BP Temp Temp src Pulse Resp SpO2 Height Weight  04/26/18 1651 - - - - 16 - - -  04/26/18 1648 (!) 157/102 - - 79 - 97 % - -  04/26/18 1551 - - - 65 - 100 % - -  04/26/18 1434 - 98.1 F (36.7 C) Rectal - - - - -  04/26/18 1400 - - - 69 17 99 % - -  04/26/18 1315 - - - 66 19 99 % - -  04/26/18 1300 137/78 - - - 18 - - -  04/26/18 1255 (!) 157/83 98.4 F (36.9 C) Oral 71 18 98 % - -  04/26/18 1253 - - - - - -  (1.626 m) 98.9 kg     Physical Exam 1315: Physical examination:  Nursing notes reviewed; Vital signs and O2 SAT reviewed;  Constitutional: Well developed, Well nourished, In no acute distress; Head:  Normocephalic, atraumatic; Eyes: EOMI, PERRL, No scleral icterus; ENMT: Mouth and pharynx normal, Mucous membranes dry; Neck: Supple, Full range of motion, No lymphadenopathy; Cardiovascular: Regular rate and rhythm, No gallop; Respiratory: Breath sounds clear & equal bilaterally, No wheezes. Normal respiratory effort/excursion; Chest: Nontender, Movement normal; Abdomen: Soft, Nontender, Nondistended, Normal bowel sounds; Genitourinary: No CVA tenderness; Extremities: Peripheral pulses normal, No tenderness, No edema, No calf edema or asymmetry.; Neuro: Awake, alert, answers "what?" to questions. Does not follow commands. No facial droop. Grips equal. Pt moves extremities spontaneously on stretcher without apparent gross focal motor deficits in extremities.; Skin: Color normal, Warm, Dry.   ED Treatments / Results  Labs (all labs ordered are listed, but only abnormal results are displayed)   EKG EKG Interpretation  Date/Time:  Tuesday April 26 2018 13:04:43 EDT Ventricular Rate:  69 PR Interval:    QRS Duration: 82 QT Interval:  433 QTC Calculation: 464 R Axis:   11 Text  Interpretation:  Sinus rhythm Repol abnrm suggests ischemia, lateral leads Baseline wander Artifact When compared with ECG of 01/23/2018 No significant change was found Confirmed by Samuel Jester [id number]) on 04/26/2018 1:21:02 PM   Radiology   Procedures Procedures (including critical care time)  Medications Ordered in ED Medications - No data to display   Initial Impression / Assessment and Plan / ED Course  I have reviewed the triage vital signs and the nursing notes.  Pertinent labs & imaging results that were available during my care of the patient were reviewed by me and considered in my medical decision making (see chart for details).     MDM Reviewed: previous chart, nursing note and vitals Reviewed previous: labs and ECG Interpretation: labs, ECG, x-ray and CT scan   Results for orders placed or performed during the hospital encounter of 04/26/18  Culture, blood (routine x 2)  Result Value Ref Range   Specimen Description RIGHT ANTECUBITAL    Special Requests      BOTTLES DRAWN AEROBIC AND ANAEROBIC Blood Culture adequate volume Performed at [facility], [address] [place], [place] [address]    Culture PENDING    Report Status PENDING   Culture, blood (routine x 2)  Result Value Ref Range   Specimen Description BLOOD RIGHT HAND    Special Requests      BOTTLES DRAWN AEROBIC ONLY Blood Culture adequate volume Performed at [facility],  [address] [place], [place] [id number]    Culture PENDING    Report Status PENDING   Urinalysis, Routine w reflex microscopic  Result Value Ref Range   Color, Urine AMBER (A) YELLOW   APPearance HAZY (A) CLEAR   Specific Gravity, Urine 1.028 1.005 - 1.030   pH 5.0 5.0 - 8.0   Glucose, UA NEGATIVE NEGATIVE mg/dL   Hgb urine dipstick NEGATIVE NEGATIVE   Bilirubin Urine NEGATIVE NEGATIVE   Ketones, ur 20 (A) NEGATIVE mg/dL   Protein, ur 30 (A) NEGATIVE mg/dL   Nitrite NEGATIVE NEGATIVE   Leukocytes,Ua NEGATIVE  NEGATIVE   RBC / HPF 6-10 0 - 5 RBC/hpf   WBC, UA 0-5 0 - 5 WBC/hpf   Bacteria, UA FEW (A) NONE SEEN   Squamous Epithelial / LPF 0-5 0 - 5   Mucus PRESENT    Hyaline Casts, UA PRESENT   CBC with Differential  Result Value Ref Range   WBC 7.0 4.0 - 10.5 K/uL   RBC 4.47 3.87 - 5.11 MIL/uL   Hemoglobin 13.5 12.0 - 15.0 g/dL   HCT 09.843.9 11.936.0 - 14.746.0 %   MCV 98.2 80.0 - 100.0 fL   MCH 30.2 26.0 - 34.0 pg   MCHC 30.8 30.0 - 36.0 g/dL   RDW 82.914.6 56.211.5 - 13.015.5 %   Platelets 164 150 - 400 K/uL   nRBC 0.0 0.0 - 0.2 %   Neutrophils Relative % 64 %   Neutro Abs 4.5 1.7 - 7.7 K/uL   Lymphocytes Relative 24 %   Lymphs Abs 1.7 0.7 - 4.0 K/uL   Monocytes Relative 8 %   Monocytes Absolute 0.5 0.1 - 1.0 K/uL   Eosinophils Relative 3 %   Eosinophils Absolute 0.2 0.0 - 0.5 K/uL   Basophils Relative 1 %   Basophils Absolute 0.0 0.0 - 0.1 K/uL   Immature Granulocytes 0 %   Abs Immature Granulocytes 0.03 0.00 - 0.07 K/uL  Lactic acid, plasma  Result Value Ref Range   Lactic Acid, Venous 1.3 0.5 - 1.9 mmol/L  Troponin I - Once  Result Value Ref Range   Troponin I <0.03 <0.03 ng/mL  Comprehensive metabolic panel  Result Value Ref Range   Sodium 144 135 - 145 mmol/L   Potassium 3.5 3.5 - 5.1 mmol/L   Chloride 110 98 - 111 mmol/L   CO2 22 22 - 32 mmol/L   Glucose, Bld 82 70 - 99 mg/dL   BUN 26 (H) 8 - 23 mg/dL   Creatinine, Ser 8.650.78 0.44 - 1.00 mg/dL   Calcium 8.9 8.9 - 78.410.3 mg/dL   Total Protein 7.2 6.5 - 8.1 g/dL   Albumin 3.7 3.5 - 5.0 g/dL   AST 19 15 - 41 U/L   ALT 12 0 - 44 U/L   Alkaline Phosphatase 87 38 - 126 U/L   Total Bilirubin 0.9 0.3 - 1.2 mg/dL   GFR calc non Af Amer >60 >60 mL/min   GFR calc Af Amer >60 >60 mL/min   Anion gap 12 5 - 15   Ct Head Wo Contrast Result Date: 04/26/2018 CLINICAL DATA:  Encephalopathy. EXAM: CT HEAD WITHOUT CONTRAST TECHNIQUE: Contiguous axial images were obtained from the base of the skull through the vertex without intravenous contrast.  COMPARISON:  04/24/2018 FINDINGS: Brain: There is no evidence of acute infarct, intracranial hemorrhage, mass, midline shift, or extra-axial fluid collection. Disc proportionate mesial temporal lobe atrophy is again seen bilaterally. Patchy cerebral white matter hypodensities are unchanged  and nonspecific but compatible with moderate chronic small vessel ischemic disease. Vascular: Calcified atherosclerosis at the skull base. No hyperdense vessel. Skull: No fracture or focal osseous lesion. Sinuses/Orbits: Visualized paranasal sinuses are clear. Persistent bilateral mastoid effusions and left cataract extraction are noted. Other: Resolution of venous gas at the skull base and in the upper neck on the prior CT. IMPRESSION: 1. No evidence of acute intracranial abnormality. 2. Moderate chronic small vessel ischemic disease. Electronically Signed   By: Sebastian Ache M.D.   On: 04/26/2018 14:36    Dg Abd Acute 2+v W 1v Chest Result Date: 04/26/2018 CLINICAL DATA:  Pain, fatigue and positive blood cultures. EXAM: DG ABDOMEN ACUTE W/ 1V CHEST COMPARISON:  04/24/2018 and prior radiographs FINDINGS: The chest radiograph is rotated and leaning towards the RIGHT. No focal airspace disease, pleural effusion or pneumothorax. Mild RIGHT basilar atelectasis/scarring again noted. Bowel gas pattern is unremarkable. No dilated bowel loops or pneumoperitoneum. No suspicious calcifications are present. No acute bony abnormalities are present. Degenerative changes in the lumbar spine again noted. IMPRESSION: 1. No evidence of acute abnormality. Unremarkable bowel gas pattern. 2. No evidence of acute cardiopulmonary disease. Electronically Signed   By: Harmon Pier M.D.   On: 04/26/2018 14:35     1330:  One BC from 04/24/18 ED visit: +coag neg staph.   1920:  Pt unable to stand for orthostatic VS due to generalized weakness. Per Epic chart review, it appears pt does walk per her baseline. Remains afebrile while in the ED. T/C  returned from [facility]. Antionette Char, case discussed, including:  HPI, pertinent PM/SHx, VS/PE, dx testing, ED course and treatment:  Agreeable to come to ED for evaluation for admission.       Final Clinical Impressions(s) / ED Diagnoses   Final diagnoses:  None    ED Discharge Orders    None       Samuel Jester, DO 04/29/18 1601

## 2018-04-26 NOTE — ED Notes (Signed)
Patient alert to self only.  Pt thinks she is at her daughters house. President is Midwife. And year is 28.

## 2018-04-26 NOTE — Telephone Encounter (Signed)
Pt went to ED

## 2018-04-26 NOTE — Telephone Encounter (Signed)
I see in the encounter notes that daughter was contacted by ER yesterday and advised to bring her mother back due to positive blood culture results (not urine). Daughter concerned because she cannot get her mother to the hospital without EMS. Wants to know what she needs to do

## 2018-04-26 NOTE — H&P (Signed)
History and Physical    Jennifer [name] XWR:[id number] DOB: 03/16/1922 DOA: 04/26/2018  PCP: [name], MD   Patient coming from: Home   Chief Complaint: Sleeping more, not eating or drinking, more confused  HPI: Jennifer [name] is a 85 y.o. female with medical history significant for dementia, CAD, COPD, osteoarthritis with chronic back and bilateral knee pain, now presenting to the emergency department for evaluation of lethargy, increased confusion, and anorexia.  History is obtained from the patient's daughter by phone, discussion with ED personnel, and review of the EMR.  Patient has been sleeping more, eating less, and more confused for close to 2 weeks now.  She was previously ambulatory, but has not ambulated since 04/17/2018.  She has been eating very little, also drinking very little for the past week and daughter has had difficulty getting the patient to swallow her pills.  She has not been coughing, has not been febrile, and has not complained of anything other than her chronic back and bilateral knee pain.  No vomiting or diarrhea.  She was seen in the emergency department on 04/24/2018, there was some concern that she may have a UTI or pneumonia and she was treated with Levaquin.  Single set of blood cultures drawn on 04/24/2018 is growing coagulase-negative Staphylococcus.  ED Course: Upon arrival to the ED, patient is found to be afebrile, hypertensive to 180/110, and vitals otherwise normal.  EKG features a sinus rhythm with nonspecific repolarization abnormality.  Noncontrast head CT is negative for acute intracranial abnormality.  Chest x-ray is negative for acute cardiopulmonary disease and KUB reveals an unremarkable bowel gas pattern.  Chemistry panel is notable for an elevated BUN to creatinine ratio and CBC is unremarkable.  Lactic acid is normal and troponin undetectable.  Blood and urine cultures were repeated in the emergency department and the patient was hydrated with  normal saline.  Review of Systems:  All other systems reviewed and apart from HPI, are negative.  Past Medical History:  Diagnosis Date   Anemia    iron deficiency post op.   CAD (coronary artery disease) 2008   Cath 40% mid LAD, 35% RCA   Cellulitis of finger    right    Chest pain 2006   Chronic back pain    COPD (chronic obstructive pulmonary disease) (HCC)    Gastroesophageal reflux disease    Hiatal hernia    Hyperlipidemia    Memory deficit    Nicotine addiction    Osteoarthritis    status post left TKA; surgery on the right is anticipated in the near future    Pulmonary nodules    stable since 2006   Shingles    right breast    Skin infection    Ulcer    gential   Use of cane as ambulatory aid     Past Surgical History:  Procedure Laterality Date   ABDOMINAL HYSTERECTOMY     BREAST SURGERY N/A    bilateral   CARDIAC CATHETERIZATION  2008   Mid LAD 40%, RCA 25%.    CHOLECYSTECTOMY     COMBINED HYSTERECTOMY ABDOMINAL W/ A&P REPAIR / OOPHORECTOMY  approx. 40 years ago    EYE SURGERY     MASTECTOMY  1998 & 2007    right -1998 / left 2007   right knee replacement  09/2009   Dr. Romeo Apple   TOTAL KNEE ARTHROPLASTY  4/08   left      reports that she quit smoking  about 3 years ago. Her smoking use included cigarettes. She has a 17.50 pack-year smoking history. She has never used smokeless tobacco. She reports that she does not drink alcohol or use drugs.  Allergies  Allergen Reactions   Aricept [Donepezil Hcl] Other (See Comments)    confusion   Iohexol      Desc: PT ALLERGIC TO CONTRAST- SHE CAN'T BREATH    Iron    Penicillins     Has patient had a PCN reaction causing immediate rash, facial/tongue/throat swelling, SOB or lightheadedness with hypotension:  unknown Has patient had a PCN reaction causing severe rash involving mucus membranes or skin necrosis: unknown Has patient had a PCN reaction that required hospitalization  unknown Has patient had a PCN reaction occurring within the last 10 years: unknown If all of the above answers are "NO", then may proceed with Cephalosporin use.     Family History  Problem Relation Age of Onset   Cancer Mother 49       breast    Cancer Father 58       prostate   Hypertension Father    Hypertension Sister    Heart disease Sister    Heart disease Sister    Heart disease Sister      Prior to Admission medications   Medication Sig Start Date End Date Taking? Authorizing Provider  acetaminophen (TYLENOL) 500 MG tablet Take 1,000 mg by mouth 2 (two) times daily. *May take an additional dose if needed for pain   Yes [provider]  celecoxib (CELEBREX) 100 MG capsule TAKE ONE CAPSULE BY MOUTH EVERY DAY Patient taking differently: Take 100 mg by mouth daily.  09/20/17  Yes [name], MD  doxycycline (VIBRA-TABS) 100 MG tablet Take 1 tablet (100 mg total) by mouth 2 (two) times daily. 03/02/18  Yes [name], MD  fluticasone (FLONASE) 50 MCG/ACT nasal spray Place 1 spray into both nostrils daily for 30 days. 01/29/18 04/26/18 Yes Shah, Pratik D, DO  gabapentin (NEURONTIN) 100 MG capsule Take 100 mg by mouth daily. Take 3 capsules by mouth at bedtime   Yes [provider]  levofloxacin (LEVAQUIN) 500 MG tablet Take 1 tablet (500 mg total) by mouth daily. 04/24/18  Yes Raeford Razor, MD  lidocaine (LIDODERM) 5 % Place 1 patch onto the skin daily. Remove & Discard patch within 12 hours or as directed by MD 03/02/18  Yes [name], MD  loperamide (IMODIUM) 2 MG capsule Take 2 mg by mouth daily.   Yes [provider]  oxybutynin (DITROPAN-XL) 5 MG 24 hr tablet TAKE 1 TABLET BY MOUTH EVERY NIGHT AT BEDTIME Patient taking differently: Take 5 mg by mouth at bedtime.  01/11/18  Yes [name], MD  Polyethyl Glycol-Propyl Glycol (SYSTANE) 0.4-0.3 % SOLN Apply 1 drop to eye every 6 (six) hours as needed (dry eyes).     Yes [provider]  traMADol (ULTRAM) 50 MG tablet Take 1 tablet (50 mg total) by mouth 2 (two) times daily as needed for moderate pain (take 45-60 minutes prior to PT). 04/07/18  Yes Freddy Finner, NP    Physical Exam: Vitals:   04/26/18 1648 04/26/18 1651 04/26/18 1800 04/26/18 1828  BP: (!) 157/102  (!) 180/112 (!) 165/104  Pulse: 79  64   Resp:  16    Temp:      TempSrc:      SpO2: 97%  96%   Weight:  Height:        Constitutional: NAD, calm  Eyes: PERTLA, lids and conjunctivae normal ENMT: Mucous membranes are moist. Posterior pharynx clear of any exudate or lesions.   Neck: normal, supple, no masses, no thyromegaly Respiratory: clear to auscultation bilaterally, no wheezing, no crackles. Normal respiratory effort.    Cardiovascular: S1 & S2 heard, regular rate and rhythm. No extremity edema.   Abdomen: No distension, no tenderness, soft. Bowel sounds active.  Musculoskeletal: no clubbing / cyanosis. No joint deformity upper and lower extremities.    Skin: no significant rashes, lesions, ulcers. Warm, dry, well-perfused. Neurologic: PERRL, EOMI. Gross hearing deficit. Right lower facial weakness. Sensation intact. Moving all extremities.  Psychiatric: Alert. Oriented to self. Calm, cooperative.    Labs on Admission: I have personally reviewed following labs and imaging studies  CBC: Recent Labs  Lab 04/24/18 1452 04/26/18 1440  WBC 6.3 7.0  NEUTROABS 4.3 4.5  HGB 14.4 13.5  HCT 47.4* 43.9  MCV 98.1 98.2  PLT 176 164   Basic Metabolic Panel: Recent Labs  Lab 04/24/18 1452 04/26/18 1440  NA 149* 144  K 4.9 3.5  CL 111 110  CO2 25 22  GLUCOSE 101* 82  BUN 25* 26*  CREATININE 1.08* 0.78  CALCIUM 9.7 8.9   GFR: Estimated Creatinine Clearance: 49.1 mL/min (by C-G formula based on SCr of 0.78 mg/dL). Liver Function Tests: Recent Labs  Lab 04/24/18 1452 04/26/18 1440  AST 25 19  ALT 14 12  ALKPHOS 86 87  BILITOT 1.3* 0.9  PROT 7.6 7.2    ALBUMIN 4.0 3.7   No results for input(s): LIPASE, AMYLASE in the last 168 hours. Recent Labs  Lab 04/24/18 1500  AMMONIA 13   Coagulation Profile: No results for input(s): INR, PROTIME in the last 168 hours. Cardiac Enzymes: Recent Labs  Lab 04/24/18 1452 04/26/18 1440  TROPONINI <0.03 <0.03   BNP (last 3 results) No results for input(s): PROBNP in the last 8760 hours. HbA1C: No results for input(s): HGBA1C in the last 72 hours. CBG: No results for input(s): GLUCAP in the last 168 hours. Lipid Profile: No results for input(s): CHOL, HDL, LDLCALC, TRIG, CHOLHDL, LDLDIRECT in the last 72 hours. Thyroid Function Tests: No results for input(s): TSH, T4TOTAL, FREET4, T3FREE, THYROIDAB in the last 72 hours. Anemia Panel: No results for input(s): VITAMINB12, FOLATE, FERRITIN, TIBC, IRON, RETICCTPCT in the last 72 hours. Urine analysis:    Component Value Date/Time   COLORURINE AMBER (A) 04/26/2018 1450   APPEARANCEUR HAZY (A) 04/26/2018 1450   LABSPEC 1.028 04/26/2018 1450   PHURINE 5.0 04/26/2018 1450   GLUCOSEU NEGATIVE 04/26/2018 1450   GLUCOSEU NEG mg/dL 23/76/2831 5176   HGBUR NEGATIVE 04/26/2018 1450   HGBUR negative 05/02/2008 1547   BILIRUBINUR NEGATIVE 04/26/2018 1450   BILIRUBINUR small 12/05/2012 1324   KETONESUR 20 (A) 04/26/2018 1450   PROTEINUR 30 (A) 04/26/2018 1450   UROBILINOGEN 1.0 12/05/2012 1324   UROBILINOGEN 0.2 05/02/2008 1547   NITRITE NEGATIVE 04/26/2018 1450   LEUKOCYTESUR NEGATIVE 04/26/2018 1450   Sepsis Labs: @LABRCNTIP (procalcitonin:4,lacticidven:4) ) Recent Results (from the past 240 hour(s))  Urine Culture     Status: None   Collection Time: 04/24/18  2:32 PM  Result Value Ref Range Status   Specimen Description   Final    URINE, CATHETERIZED Performed at [facility], [address], [place], [place] [address]    Special Requests   Final    NONE Performed at [facility], [address] [place], [place] [address]    Culture    Final    Multiple bacterial morphotypes present, none predominant. Suggest appropriate recollection if clinically indicated.   Report Status 04/25/2018 FINAL  Final  Blood culture (routine x 2)     Status: Abnormal   Collection Time: 04/24/18  3:01 PM  Result Value Ref Range Status   Specimen Description   Final    BLOOD RIGHT ARM Performed at [facility], [address] [place], [place] [address]    Special Requests   Final    BOTTLES DRAWN AEROBIC AND ANAEROBIC Blood Culture adequate volume Performed at [facility], [address] [place], [place] [address]    Culture  Setup Time   Final    GRAM POSITIVE COCCI ANAEROBIC BOTTLE Gram Stain Report Called to,Read Back By and Verified With: SIMMS M. AT 1035A ON [id number] BY THOMPSON S. CRITICAL RESULT CALLED TO, READ BACK BY AND VERIFIED WITH: A. ADKINS, RN (MCHP) AT 1710 ON 04/25/18 BY C. JESSUP, MLT.    Culture (A)  Final    STAPHYLOCOCCUS SPECIES (COAGULASE NEGATIVE) THE SIGNIFICANCE OF ISOLATING THIS ORGANISM FROM A SINGLE SET OF BLOOD CULTURES WHEN MULTIPLE SETS ARE DRAWN IS UNCERTAIN. PLEASE NOTIFY THE MICROBIOLOGY DEPARTMENT WITHIN ONE WEEK IF SPECIATION AND SENSITIVITIES ARE REQUIRED. Performed at [facility], [address] [place], [place] [address]    Report Status 04/26/2018 FINAL  Final  Blood culture (routine x 2)     Status: None (Preliminary result)   Collection Time: 04/24/18  3:01 PM  Result Value Ref Range Status   Specimen Description BLOOD LEFT ARM  Final   Special Requests   Final    BOTTLES DRAWN AEROBIC AND ANAEROBIC Blood Culture adequate volume   Culture   Final    NO GROWTH 2 DAYS Performed at [facility], [address] [place], [place] [address]    Report Status PENDING  Incomplete  Blood Culture ID Panel (Reflexed)     Status: Abnormal   Collection Time: 04/24/18  3:01 PM  Result Value Ref Range Status   Enterococcus species NOT DETECTED NOT DETECTED Final   Listeria monocytogenes NOT  DETECTED NOT DETECTED Final   Staphylococcus species DETECTED (A) NOT DETECTED Final    Comment: Methicillin (oxacillin) susceptible coagulase negative staphylococcus. Possible blood culture contaminant (unless isolated from more than one blood culture draw or clinical case suggests pathogenicity). No antibiotic treatment is indicated for blood  culture contaminants. CRITICAL RESULT CALLED TO, READ BACK BY AND VERIFIED WITH: A. ADKINS, RN (M[facility]P) AT 1710 ON 04/25/18 BY C. JESSUP, MLT.    Staphylococcus aureus (BCID) NOT DETECTED NOT DETECTED Final   Methicillin resistance NOT DETECTED NOT DETECTED Final   Streptococcus species NOT DETECTED NOT DETECTED Final   Streptococcus agalactiae NOT DETECTED NOT DETECTED Final   Streptococcus pneumoniae NOT DETECTED NOT DETECTED Final   Streptococcus pyogenes NOT DETECTED NOT DETECTED Final   Acinetobacter baumannii NOT DETECTED NOT DETECTED Final   Enterobacteriaceae species NOT DETECTED NOT DETECTED Final   Enterobacter cloacae complex NOT DETECTED NOT DETECTED Final   Escherichia coli NOT DETECTED NOT DETECTED Final   Klebsiella oxytoca NOT DETECTED NOT DETECTED Final   Klebsiella pneumoniae NOT DETECTED NOT DETECTED Final   Proteus species NOT DETECTED NOT DETECTED Final   Serratia marcescens NOT DETECTED NOT DETECTED Final   Haemophilus influenzae NOT DETECTED NOT DETECTED Final   Neisseria meningitidis NOT DETECTED NOT DETECTED Final   Pseudomonas aeruginosa NOT  DETECTED NOT DETECTED Final   Candida albicans NOT DETECTED NOT DETECTED Final   Candida glabrata NOT DETECTED NOT DETECTED Final   Candida krusei NOT DETECTED NOT DETECTED Final   Candida parapsilosis NOT DETECTED NOT DETECTED Final   Candida tropicalis NOT DETECTED NOT DETECTED Final    Comment: Performed at [facility], [address], [place], [place] [address]  Culture, blood (routine x 2)     Status: None (Preliminary result)   Collection Time: 04/26/18  2:40 PM  Result  Value Ref Range Status   Specimen Description RIGHT ANTECUBITAL  Final   Special Requests   Final    BOTTLES DRAWN AEROBIC AND ANAEROBIC Blood Culture adequate volume Performed at [facility], [address] [place], [place] [address]    Culture PENDING  Incomplete   Report Status PENDING  Incomplete  Culture, blood (routine x 2)     Status: None (Preliminary result)   Collection Time: 04/26/18  3:04 PM  Result Value Ref Range Status   Specimen Description BLOOD RIGHT HAND  Final   Special Requests   Final    BOTTLES DRAWN AEROBIC ONLY Blood Culture adequate volume Performed at [facility], [address], [place], [place] [address]    Culture PENDING  Incomplete   Report Status PENDING  Incomplete     Radiological Exams on Admission: Ct Head Wo Contrast  Result Date: 04/26/2018 CLINICAL DATA:  Encephalopathy. EXAM: CT HEAD WITHOUT CONTRAST TECHNIQUE: Contiguous axial images were obtained from the base of the skull through the vertex without intravenous contrast. COMPARISON:  04/24/2018 FINDINGS: Brain: There is no evidence of acute infarct, intracranial hemorrhage, mass, midline shift, or extra-axial fluid collection. Disc proportionate mesial temporal lobe atrophy is again seen bilaterally. Patchy cerebral white matter hypodensities are unchanged and nonspecific but compatible with moderate chronic small vessel ischemic disease. Vascular: Calcified atherosclerosis at the skull base. No hyperdense vessel. Skull: No fracture or focal osseous lesion. Sinuses/Orbits: Visualized paranasal sinuses are clear. Persistent bilateral mastoid effusions and left cataract extraction are noted. Other: Resolution of venous gas at the skull base and in the upper neck on the prior CT. IMPRESSION: 1. No evidence of acute intracranial abnormality. 2. Moderate chronic small vessel ischemic disease. Electronically Signed   By: Sebastian Ache M.D.   On: 04/26/2018 14:36   Dg Abd Acute 2+v W 1v Chest  Result  Date: 04/26/2018 CLINICAL DATA:  Pain, fatigue and positive blood cultures. EXAM: DG ABDOMEN ACUTE W/ 1V CHEST COMPARISON:  04/24/2018 and prior radiographs FINDINGS: The chest radiograph is rotated and leaning towards the RIGHT. No focal airspace disease, pleural effusion or pneumothorax. Mild RIGHT basilar atelectasis/scarring again noted. Bowel gas pattern is unremarkable. No dilated bowel loops or pneumoperitoneum. No suspicious calcifications are present. No acute bony abnormalities are present. Degenerative changes in the lumbar spine again noted. IMPRESSION: 1. No evidence of acute abnormality. Unremarkable bowel gas pattern. 2. No evidence of acute cardiopulmonary disease. Electronically Signed   By: Harmon Pier M.D.   On: 04/26/2018 14:35    EKG: Independently reviewed. Sinus rhythm, non-specific repolarization abnormality.   Assessment/Plan   1. Acute encephalopathy  - Presents with ~2 wks of progressively worsening lethargy, confusion, and loss of appetite  - She has a positive blood culture without clear infectious signs/symptoms as addressed below; UA not suggestive of infection, CXR is clear with no respiratory sxs, there is no meningismus, no abd tenderness, no red or hot joints, and no wounds or cellulitis  noted  - There may be some subtle right lower facial weakness and CVA is considered, but CT head remains negative despite almost 2 wks of sxs - Ammonia was normal on 4/12, will check TSH, B12 and folate, and RPR - There is elevated [id number] ratio and urine ketones in setting of anorexia and she will be hydrated with IVF  - Discussed with patient's daughter that will assess for reversible causes for her decline, and possible that she will improve some with IVF hydration, but this may reflect a progression in her dementia    2. Positive blood culture  - 1 set of blood cultures from 04/24/18 is growing coag-neg Staph  - Likely contaminant; she has been afebrile with normal WBC  and lactate, has prosthetic knees b/l without erythema or effusion on exam  - Given lethargy/FTT without definite etiology, will repeat cultures and start vancomycin while evaluating for other etiologies for her change in condition    3. COPD  - No cough, wheeze, or dyspnea on admission  - Use albuterol as needed    4. Hypertension  - BP 180/110 in ED  - Not on any antihypertensives at home - Does not appear to be in any pain or anxious, not complaining of headache or chest pain  - Use hydralazine IVP as needed    5. Dysphagia  - Per daughter, patient has been unable to swallow her pills at home  - Keep NPO for now and consult with SLP    PPE: Mask, face shield  DVT prophylaxis: Lovenox  Code Status: DNR  Family Communication: Discussed with daughter [name]) by phone  Consults called: None Admission status: Observation     Briscoe Deutscher, MD [facility] Pager [phone]  If 7PM-7AM, please contact night-coverage [email or web address] Password [facility]  04/26/2018, 7:38 PM

## 2018-04-27 ENCOUNTER — Telehealth: Payer: Self-pay | Admitting: Emergency Medicine

## 2018-04-27 ENCOUNTER — Telehealth: Payer: Self-pay | Admitting: *Deleted

## 2018-04-27 DIAGNOSIS — J449 Chronic obstructive pulmonary disease, unspecified: Secondary | ICD-10-CM | POA: Diagnosis not present

## 2018-04-27 DIAGNOSIS — G9340 Encephalopathy, unspecified: Secondary | ICD-10-CM | POA: Diagnosis not present

## 2018-04-27 DIAGNOSIS — E860 Dehydration: Secondary | ICD-10-CM | POA: Diagnosis not present

## 2018-04-27 DIAGNOSIS — J309 Allergic rhinitis, unspecified: Secondary | ICD-10-CM

## 2018-04-27 DIAGNOSIS — F0280 Dementia in other diseases classified elsewhere without behavioral disturbance: Secondary | ICD-10-CM | POA: Diagnosis not present

## 2018-04-27 DIAGNOSIS — G301 Alzheimer's disease with late onset: Secondary | ICD-10-CM | POA: Diagnosis not present

## 2018-04-27 DIAGNOSIS — R6251 Failure to thrive (child): Secondary | ICD-10-CM | POA: Diagnosis not present

## 2018-04-27 DIAGNOSIS — R7881 Bacteremia: Secondary | ICD-10-CM | POA: Diagnosis not present

## 2018-04-27 LAB — URINE CULTURE: Culture: NO GROWTH

## 2018-04-27 LAB — CBC WITH DIFFERENTIAL/PLATELET
Abs Immature Granulocytes: 0.02 10*3/uL (ref 0.00–0.07)
Basophils Absolute: 0 10*3/uL (ref 0.0–0.1)
Basophils Relative: 0 %
Eosinophils Absolute: 0.2 10*3/uL (ref 0.0–0.5)
Eosinophils Relative: 3 %
HCT: 42.6 % (ref 36.0–46.0)
Hemoglobin: 13.2 g/dL (ref 12.0–15.0)
Immature Granulocytes: 0 %
Lymphocytes Relative: 22 %
Lymphs Abs: 1.3 10*3/uL (ref 0.7–4.0)
MCH: 30.3 pg (ref 26.0–34.0)
MCHC: 31 g/dL (ref 30.0–36.0)
MCV: 97.7 fL (ref 80.0–100.0)
Monocytes Absolute: 0.5 10*3/uL (ref 0.1–1.0)
Monocytes Relative: 8 %
Neutro Abs: 4.1 10*3/uL (ref 1.7–7.7)
Neutrophils Relative %: 67 %
Platelets: 135 10*3/uL — ABNORMAL LOW (ref 150–400)
RBC: 4.36 MIL/uL (ref 3.87–5.11)
RDW: 14.5 % (ref 11.5–15.5)
WBC: 6.1 10*3/uL (ref 4.0–10.5)
nRBC: 0 % (ref 0.0–0.2)

## 2018-04-27 LAB — BASIC METABOLIC PANEL
Anion gap: 10 (ref 5–15)
BUN: 22 mg/dL (ref 8–23)
CO2: 24 mmol/L (ref 22–32)
Calcium: 8.7 mg/dL — ABNORMAL LOW (ref 8.9–10.3)
Chloride: 109 mmol/L (ref 98–111)
Creatinine, Ser: 0.67 mg/dL (ref 0.44–1.00)
GFR calc Af Amer: >60 mL/min (ref >60)
GFR calc non Af Amer: >60 mL/min (ref >60)
Glucose, Bld: 107 mg/dL — ABNORMAL HIGH (ref 70–99)
Potassium: 3.4 mmol/L — ABNORMAL LOW (ref 3.5–5.1)
Sodium: 143 mmol/L (ref 135–145)

## 2018-04-27 MED ORDER — ADULT MULTIVITAMIN LIQUID CH
15.0000 mL | Freq: Every day | ORAL | Status: DC
  Administered 2018-04-27 – 2018-04-29 (×3): 15 mL via ORAL
  Filled 2018-04-27 (×3): qty 15

## 2018-04-27 MED ORDER — SODIUM CHLORIDE 0.9% FLUSH: 10.0000 mL | INTRAVENOUS | Status: DC | PRN

## 2018-04-27 MED ORDER — ENSURE ENLIVE PO LIQD
237.0000 mL | Freq: Two times a day (BID) | ORAL | Status: DC
  Administered 2018-04-27 – 2018-04-29 (×5): 237 mL via ORAL

## 2018-04-27 MED ORDER — SODIUM CHLORIDE 0.9% FLUSH
10.0000 mL | Freq: Two times a day (BID) | INTRAVENOUS | Status: DC
  Administered 2018-04-27: 1 mL
  Administered 2018-04-28 – 2018-04-29 (×3): 10 mL

## 2018-04-27 MED ORDER — SODIUM CHLORIDE 0.9 % IV SOLN
INTRAVENOUS | Status: AC
  Administered 2018-04-27: 19:00:00 via INTRAVENOUS

## 2018-04-27 MED ORDER — FLUTICASONE PROPIONATE 50 MCG/ACT NA SUSP
1.0000 | Freq: Every day | NASAL | Status: DC
  Administered 2018-04-27 – 2018-04-29 (×3): 1 via NASAL
  Filled 2018-04-27 (×3): qty 16

## 2018-04-27 MED ORDER — POLYVINYL ALCOHOL 1.4 % OP SOLN
1.0000 [drp] | Freq: Four times a day (QID) | OPHTHALMIC | Status: DC | PRN
  Administered 2018-04-28: 1 [drp] via OPHTHALMIC
  Filled 2018-04-27: qty 15

## 2018-04-27 NOTE — Telephone Encounter (Signed)
I spoke with daughter [name] at 20;50 pm She wanted help making the " right decision " for her mom. Agrees that she needs to be in Rehab , and skilled facility, states unable to care for her at home  Right now but terrified of the Covid 19 in homes. I reassured her that utmost care was being taken to keep the virus OUTSIDE of facilities by preventing/ limiting visitation, an that her being at home when she [name] was incapable physically of caring for her was equaly dangerous. I did acknowledge an increased risk of exposure in herd situations. She stated she was comfortable with thes conversation and had found it beneficial

## 2018-04-27 NOTE — Telephone Encounter (Signed)
Post ED Visit - Positive Culture Follow-up  Culture report reviewed by antimicrobial stewardship pharmacist: [facility] []  Enzo Bi, Pharm.D. []  Celedonio Miyamoto, Pharm.D., BCPS AQ-ID []  Garvin Fila, Pharm.D., BCPS []  Georgina Pillion, [address] BCPS []  [place], [address]D., BCPS, AAHIVP []  Estella Husk, Pharm.D., BCPS, AAHIVP [x]  Lysle Pearl, PharmD, BCPS []  Phillips Climes, PharmD, BCPS []  Agapito Games, PharmD, BCPS []  Verlan Friends, PharmD []  Mervyn Gay, PharmD, BCPS []  Vinnie Level, PharmD  [facility] []  Len Childs, PharmD []  Greer Pickerel, PharmD []  Adalberto Cole, PharmD []  Perlie Gold, Rph []  Lonell Face) Jean Rosenthal, PharmD []  Earl Many, PharmD []  Junita Push, PharmD []  Dorna Leitz, PharmD []  Terrilee Files, PharmD []  Lynann Beaver, PharmD []  Keturah Barre, PharmD []  Loralee Pacas, PharmD []  Bernadene Person, PharmD   Positive blood culture Treated with levofloxacin, admitted to [facility] further patient follow-up is required at this time.  Berle Mull 04/27/2018, 9:22 AM

## 2018-04-27 NOTE — Progress Notes (Signed)
PROGRESS NOTE    Jennifer [name]  WUJ:[id number] DOB: 05-24-1922 DOA: 04/26/2018 PCP: [name], MD     Brief Narrative:  85 y.o. female with medical history significant for dementia, CAD, COPD, osteoarthritis with chronic back and bilateral knee pain, now presenting to the emergency department for evaluation of lethargy, increased confusion, and anorexia.  History is obtained from the patient's daughter by phone, discussion with ED personnel, and review of the EMR.  Patient has been sleeping more, eating less, and more confused for close to 2 weeks now.  She was previously ambulatory, but has not ambulated since 04/17/2018.  She has been eating very little, also drinking very little for the past week and daughter has had difficulty getting the patient to swallow her pills.  She has not been coughing, has not been febrile, and has not complained of anything other than her chronic back and bilateral knee pain.  No vomiting or diarrhea.  She was seen in the emergency department on 04/24/2018, there was some concern that she may have a UTI or pneumonia and she was treated with Levaquin.  Single set of blood cultures drawn on 04/24/2018 is growing coagulase-negative Staphylococcus.   Assessment & Plan: 1-Acute encephalopathy: In the setting of further progression of her dementia and underlying dehydration. -Patient recently also started on Levaquin which could potentially be responsible for changes in mentation as well. -Given no signs of acute infection appreciated on x-ray or urinalysis/recent cultures.  Will discontinue antibiotics -Continue IV fluid resuscitation -Slowly advance diet as indicated by speech therapy. -Physical therapy has found patient adequate for rehabilitation and conditioning at the skilled nursing facility.  2-dysphasia -Following recommendation by speech therapy will use dysphagia 3 with thin liquids.  3-COPD (chronic obstructive pulmonary disease) (HCC) -Currently  no wheezing and with good oxygen saturation on room air -Continue as needed nebulizers.  4-allergy rhinitis/dry eye syndrome -Resume home eyedrops and Flonase.  5-Dementia without behavioral disturbance (HCC) -Continue supportive care -Per physical therapy evaluation will benefit of skilled nursing facility rehabilitation at discharge.  6-positive blood culture -1 out of 2 coagulase-negative staph -Contaminant in nature -Discontinue vancomycin -No signs of acute infection appreciated at this time. -Will observe off antibiotics.  7-dehydration with failure to thrive -Continue IV fluids -Most likely associated with progression of dementia -Following dietitian recommendation will use Ensure twice a day.  DVT prophylaxis: Lovenox Code Status: DNR Family Communication: No family at bedside. Disposition Plan: Physical therapy recommending SNF at discharge.  For now we will continue fluid resuscitation, advancing diet as per speech therapy recommendations and reassess blood work/vital signs in a.m.  Consultants:   None  Procedures:   See below for x-ray reports.  Antimicrobials:  Anti-infectives (From admission, onward)   Start     Dose/Rate Route Frequency Ordered Stop   04/27/18 2200  vancomycin (VANCOCIN) 1,500 mg in sodium chloride 0.9 % 500 mL IVPB  Status:  Discontinued     1,500 mg 250 mL/hr over 120 Minutes Intravenous Every 24 hours 04/26/18 2109 04/27/18 0832   04/26/18 2130  vancomycin (VANCOCIN) IVPB 1000 mg/200 mL premix     1,000 mg 200 mL/hr over 60 Minutes Intravenous Every 1 hr x 2 04/26/18 2056 04/26/18 2329       Subjective: Afebrile, no chest pain, no shortness of breath, no nausea, no vomiting.  Patient is more alert, oriented x2 and able to follow commands appropriately.  No acute distress.  Objective: Vitals:   04/26/18 1828 04/26/18 2034 04/27/18  0521 04/27/18 1446  BP: (!) 165/104 (!) 153/81 (!) 162/90 102/75  Pulse:  64 74 75  Resp:  20 16    Temp:  97.8 F (36.6 C) 98.5 F (36.9 C) 98.4 F (36.9 C)  TempSrc:  Oral Oral Oral  SpO2:  100% 100% 98%  Weight:      Height:        Intake/Output Summary (Last 24 hours) at 04/27/2018 1652 Last data filed at 04/27/2018 0500 Gross per 24 hour  Intake 178.92 ml  Output 100 ml  Net 78.92 ml   Filed Weights   04/26/18 1253  Weight: 98.9 kg    Examination: General exam: Alert, awake, oriented x 2; able to follow commands  and answering questions appropriately.  Patient afebrile. Respiratory system: Clear to auscultation. Respiratory effort normal. Cardiovascular system:RRR.  Soft systolic ejection murmur appreciated on exam; no rubs, no gallops, no JVD.   Gastrointestinal system: Abdomen is nondistended, soft and nontender. No organomegaly or masses felt. Normal bowel sounds heard. Central nervous system: Alert and oriented. No focal neurological deficits. Extremities: No C/C/E, +pedal pulses Skin: No rashes, lesions or ulcers Psychiatry: Judgement and insight appear normal. Mood & affect appropriate.     Data Reviewed: I have personally reviewed following labs and imaging studies  CBC: Recent Labs  Lab 04/24/18 1452 04/26/18 1440 04/27/18 1333  WBC 6.3 7.0 6.1  NEUTROABS 4.3 4.5 4.1  HGB 14.4 13.5 13.2  HCT 47.4* 43.9 42.6  MCV 98.1 98.2 97.7  PLT 176 164 135*   Basic Metabolic Panel: Recent Labs  Lab 04/24/18 1452 04/26/18 1440 04/27/18 1333  NA 149* 144 143  K 4.9 3.5 3.4*  CL 111 110 109  CO2 25 22 24   GLUCOSE 101* 82 107*  BUN 25* 26* 22  CREATININE 1.08* 0.78 0.67  CALCIUM 9.7 8.9 8.7*   GFR: Estimated Creatinine Clearance: 49.1 mL/min (by C-G formula based on SCr of 0.67 mg/dL).   Liver Function Tests: Recent Labs  Lab 04/24/18 1452 04/26/18 1440  AST 25 19  ALT 14 12  ALKPHOS 86 87  BILITOT 1.3* 0.9  PROT 7.6 7.2  ALBUMIN 4.0 3.7    Recent Labs  Lab 04/24/18 1500  AMMONIA 13   Cardiac Enzymes: Recent Labs  Lab 04/24/18 1452  04/26/18 1440  TROPONINI <0.03 <0.03   Thyroid Function Tests: Recent Labs    04/26/18 1935  TSH 1.041   Anemia Panel: Recent Labs    04/26/18 1935  VITAMINB12 871   Urine analysis:    Component Value Date/Time   COLORURINE AMBER (A) 04/26/2018 1450   APPEARANCEUR HAZY (A) 04/26/2018 1450   LABSPEC 1.028 04/26/2018 1450   PHURINE 5.0 04/26/2018 1450   GLUCOSEU NEGATIVE 04/26/2018 1450   GLUCOSEU NEG mg/dL 69/62/952801/17/2008 41320443   HGBUR NEGATIVE 04/26/2018 1450   HGBUR negative 05/02/2008 1547   BILIRUBINUR NEGATIVE 04/26/2018 1450   BILIRUBINUR small 12/05/2012 1324   KETONESUR 20 (A) 04/26/2018 1450   PROTEINUR 30 (A) 04/26/2018 1450   UROBILINOGEN 1.0 12/05/2012 1324   UROBILINOGEN 0.2 05/02/2008 1547   NITRITE NEGATIVE 04/26/2018 1450   LEUKOCYTESUR NEGATIVE 04/26/2018 1450    Recent Results (from the past 240 hour(s))  Urine Culture     Status: None   Collection Time: 04/24/18  2:32 PM  Result Value Ref Range Status   Specimen Description   Final    URINE, CATHETERIZED Performed at [facility], [address] [place], [place] [address]  Special Requests   Final    NONE Performed at [facility], [address] [place], [place] [address]    Culture   Final    Multiple bacterial morphotypes present, none predominant. Suggest appropriate recollection if clinically indicated.   Report Status 04/25/2018 FINAL  Final  Blood culture (routine x 2)     Status: Abnormal   Collection Time: 04/24/18  3:01 PM  Result Value Ref Range Status   Specimen Description   Final    BLOOD RIGHT ARM Performed at [facility], [address], [place], [place] [address]    Special Requests   Final    BOTTLES DRAWN AEROBIC AND ANAEROBIC Blood Culture adequate volume Performed at [facility], [address], [place], [place] [address]    Culture  Setup Time   Final    GRAM POSITIVE COCCI ANAEROBIC BOTTLE Gram Stain Report Called to,Read Back By and Verified With: SIMMS M. AT  [id number] ON [id number] BY THOMPSON S. CRITICAL RESULT CALLED TO, READ BACK BY AND VERIFIED WITH: A. ADKINS, RN (MCHP) AT 1710 ON 04/25/18 BY C. JESSUP, MLT.    Culture (A)  Final    STAPHYLOCOCCUS SPECIES (COAGULASE NEGATIVE) THE SIGNIFICANCE OF ISOLATING THIS ORGANISM FROM A SINGLE SET OF BLOOD CULTURES WHEN MULTIPLE SETS ARE DRAWN IS UNCERTAIN. PLEASE NOTIFY THE MICROBIOLOGY DEPARTMENT WITHIN ONE WEEK IF SPECIATION AND SENSITIVITIES ARE REQUIRED. Performed at [facility], [address], [place], [place] [address]    Report Status 04/26/2018 FINAL  Final  Blood culture (routine x 2)     Status: None (Preliminary result)   Collection Time: 04/24/18  3:01 PM  Result Value Ref Range Status   Specimen Description BLOOD LEFT ARM  Final   Special Requests   Final    BOTTLES DRAWN AEROBIC AND ANAEROBIC Blood Culture adequate volume   Culture   Final    NO GROWTH 3 DAYS Performed at [facility], [address], [place], [place] [address]    Report Status PENDING  Incomplete  Blood Culture ID Panel (Reflexed)     Status: Abnormal   Collection Time: 04/24/18  3:01 PM  Result Value Ref Range Status   Enterococcus species NOT DETECTED NOT DETECTED Final   Listeria monocytogenes NOT DETECTED NOT DETECTED Final   Staphylococcus species DETECTED (A) NOT DETECTED Final    Comment: Methicillin (oxacillin) susceptible coagulase negative staphylococcus. Possible blood culture contaminant (unless isolated from more than one blood culture draw or clinical case suggests pathogenicity). No antibiotic treatment is indicated for blood  culture contaminants. CRITICAL RESULT CALLED TO, READ BACK BY AND VERIFIED WITH: A. ADKINS, RN (MCHP) AT 1710 ON 04/25/18 BY C. JESSUP, MLT.    Staphylococcus aureus (BCID) NOT DETECTED NOT DETECTED Final   Methicillin resistance NOT DETECTED NOT DETECTED Final   Streptococcus species NOT DETECTED NOT DETECTED Final   Streptococcus agalactiae NOT DETECTED NOT DETECTED Final    Streptococcus pneumoniae NOT DETECTED NOT DETECTED Final   Streptococcus pyogenes NOT DETECTED NOT DETECTED Final   Acinetobacter baumannii NOT DETECTED NOT DETECTED Final   Enterobacteriaceae species NOT DETECTED NOT DETECTED Final   Enterobacter cloacae complex NOT DETECTED NOT DETECTED Final   Escherichia coli NOT DETECTED NOT DETECTED Final   Klebsiella oxytoca NOT DETECTED NOT DETECTED Final   Klebsiella pneumoniae NOT DETECTED NOT DETECTED Final   Proteus species NOT DETECTED NOT DETECTED Final   Serratia marcescens NOT DETECTED NOT DETECTED Final   Haemophilus influenzae NOT DETECTED NOT DETECTED  Final   Neisseria meningitidis NOT DETECTED NOT DETECTED Final   Pseudomonas aeruginosa NOT DETECTED NOT DETECTED Final   Candida albicans NOT DETECTED NOT DETECTED Final   Candida glabrata NOT DETECTED NOT DETECTED Final   Candida krusei NOT DETECTED NOT DETECTED Final   Candida parapsilosis NOT DETECTED NOT DETECTED Final   Candida tropicalis NOT DETECTED NOT DETECTED Final    Comment: Performed at [facility], [address], [place], [place] [address]  Culture, blood (routine x 2)     Status: None (Preliminary result)   Collection Time: 04/26/18  2:40 PM  Result Value Ref Range Status   Specimen Description RIGHT ANTECUBITAL  Final   Special Requests   Final    BOTTLES DRAWN AEROBIC AND ANAEROBIC Blood Culture adequate volume   Culture   Final    NO GROWTH < 24 HOURS Performed at [facility], [address], [place], [place] [address]    Report Status PENDING  Incomplete  Culture, blood (routine x 2)     Status: None (Preliminary result)   Collection Time: 04/26/18  3:04 PM  Result Value Ref Range Status   Specimen Description BLOOD RIGHT HAND  Final   Special Requests   Final    BOTTLES DRAWN AEROBIC ONLY Blood Culture adequate volume   Culture   Final    NO GROWTH < 24 HOURS Performed at [facility], [address] [place], [place] [address]    Report Status  PENDING  Incomplete     Radiology Studies: Ct Head Wo Contrast  Result Date: 04/26/2018 CLINICAL DATA:  Encephalopathy. EXAM: CT HEAD WITHOUT CONTRAST TECHNIQUE: Contiguous axial images were obtained from the base of the skull through the vertex without intravenous contrast. COMPARISON:  04/24/2018 FINDINGS: Brain: There is no evidence of acute infarct, intracranial hemorrhage, mass, midline shift, or extra-axial fluid collection. Disc proportionate mesial temporal lobe atrophy is again seen bilaterally. Patchy cerebral white matter hypodensities are unchanged and nonspecific but compatible with moderate chronic small vessel ischemic disease. Vascular: Calcified atherosclerosis at the skull base. No hyperdense vessel. Skull: No fracture or focal osseous lesion. Sinuses/Orbits: Visualized paranasal sinuses are clear. Persistent bilateral mastoid effusions and left cataract extraction are noted. Other: Resolution of venous gas at the skull base and in the upper neck on the prior CT. IMPRESSION: 1. No evidence of acute intracranial abnormality. 2. Moderate chronic small vessel ischemic disease. Electronically Signed   By: Sebastian Ache M.D.   On: 04/26/2018 14:36   Dg Abd Acute 2+v W 1v Chest  Result Date: 04/26/2018 CLINICAL DATA:  Pain, fatigue and positive blood cultures. EXAM: DG ABDOMEN ACUTE W/ 1V CHEST COMPARISON:  04/24/2018 and prior radiographs FINDINGS: The chest radiograph is rotated and leaning towards the RIGHT. No focal airspace disease, pleural effusion or pneumothorax. Mild RIGHT basilar atelectasis/scarring again noted. Bowel gas pattern is unremarkable. No dilated bowel loops or pneumoperitoneum. No suspicious calcifications are present. No acute bony abnormalities are present. Degenerative changes in the lumbar spine again noted. IMPRESSION: 1. No evidence of acute abnormality. Unremarkable bowel gas pattern. 2. No evidence of acute cardiopulmonary disease. Electronically Signed   By:  Harmon Pier M.D.   On: 04/26/2018 14:35    Scheduled Meds: . diclofenac sodium  2 g Topical QID  . enoxaparin (LOVENOX) injection  40 mg Subcutaneous Q24H  . feeding supplement (ENSURE ENLIVE)  237 mL Oral BID BM  . fluticasone  1 spray Each Nare Daily  . lidocaine  1  patch Transdermal Q24H  . multivitamin  15 mL Oral Daily  . sodium chloride flush  10-40 mL Intracatheter Q12H   Continuous Infusions: . sodium chloride       LOS: 0 days    Time spent: 30 minutes    Vassie Loll, MD [facility] Pager [phone]   04/27/2018, 4:52 PM

## 2018-04-27 NOTE — Telephone Encounter (Signed)
Called [name] back and told her I got her message to dr s and everything would be alright and to call us if she needed anything

## 2018-04-27 NOTE — Plan of Care (Signed)
  Problem: Acute Rehab PT Goals(only PT should resolve) Goal: Pt Will Go Supine/Side To Sit Outcome: Progressing Flowsheets (Taken 04/27/2018 0920) Pt will go Supine/Side to Sit: with moderate assist Goal: Patient Will Transfer Sit To/From Stand Outcome: Progressing Flowsheets (Taken 04/27/2018 0920) Patient will transfer sit to/from stand: with moderate assist; with maximum assist Goal: Pt Will Transfer Bed To Chair/Chair To Bed Outcome: Progressing Flowsheets (Taken 04/27/2018 0920) Pt will Transfer Bed to Chair/Chair to Bed: with max assist; with mod assist Goal: Pt Will Ambulate Outcome: Progressing Flowsheets (Taken 04/27/2018 0920) Pt will Ambulate: 10 feet; with moderate assist; with maximum assist; with rolling walker   9:20 AM, 04/27/18 Ocie Bob, MPT Physical Therapist with [facility] 336 [phone] office [phone] mobile phone

## 2018-04-27 NOTE — [place] FL2 (Signed)
[place] MEDICAID FL2 LEVEL OF CARE SCREENING TOOL     IDENTIFICATION  Patient Name: Jennifer [name] Birthdate: 05-14-1922 Sex: female Admission Date (Current Location): 04/26/2018  [facility] [place]   Facility and Address:  [facility],  [address], [place] [id number]      Provider Number: [id number]  Attending Physician Name and Address:  Vassie LollMadera, Carlos, MD  Relative Name and Phone Number:       Current Level of Care: Hospital Recommended Level of Care: Skilled Nursing Facility Prior Approval Number:    Date Approved/Denied:   PASRR Number: [id number]  Discharge Plan: SNF    Current Diagnoses: Patient Active Problem List   Diagnosis Date Noted  . Acute encephalopathy 04/26/2018  . Elevated blood pressure, situational 04/26/2018  . Positive blood culture 04/26/2018  . Shingles (herpes zoster) polyneuropathy 03/11/2018  . Advanced care planning/counseling discussion   . Palliative care by specialist   . Goals of care, counseling/discussion   . Altered mental status 01/24/2018  . Frequent falls 01/24/2018  . Burnout of caregiver 07/21/2016  . DNR no code (do not resuscitate) 04/25/2014  . Urinary incontinence 04/04/2013  . Osteoporosis, post-menopausal 12/06/2012  . At high risk for falls 07/30/2012  . Dementia without behavioral disturbance (HCC) 08/19/2011  . COPD (chronic obstructive pulmonary disease) (HCC) 06/25/2011  . Allergic rhinitis 12/23/2010  . CEREBROVASCULAR DISEASE 08/05/2009  . Spinal stenosis 06/09/2007  . Coronary atherosclerosis 01/25/2007  . HIATAL HERNIA WITH REFLUX 01/25/2007  . Osteoarthritis 01/25/2007    Orientation RESPIRATION BLADDER Height & Weight     Self  Normal Incontinent Weight: 98.9 kg Height:  5\' 4"  (162.6 cm)  BEHAVIORAL SYMPTOMS/MOOD NEUROLOGICAL BOWEL NUTRITION STATUS  (none) (none) Incontinent Diet(REGULAR)  AMBULATORY STATUS COMMUNICATION OF NEEDS Skin   Extensive Assist  Verbally Normal                       Personal Care Assistance Level of Assistance  Bathing, Feeding, Dressing Bathing Assistance: Maximum assistance Feeding assistance: Independent Dressing Assistance: Maximum assistance     Functional Limitations Info  Sight, Hearing, Speech Sight Info: Adequate Hearing Info: Adequate Speech Info: Adequate    SPECIAL CARE FACTORS FREQUENCY  PT (By licensed PT)     PT Frequency: 5X/W              Contractures Contractures Info: Not present    Additional Factors Info  Allergies, Code Status Code Status Info: DNR Allergies Info: Aricept, Iohexol, Iron, Penicillins           Current Medications (04/27/2018):  This is the current hospital active medication list Current Facility-Administered Medications  Medication Dose Route Frequency Provider Last Rate Last Dose  . acetaminophen (TYLENOL) tablet 650 mg  650 mg Oral Q6H PRN Opyd, Lavone Neriimothy S, MD       Or  . acetaminophen (TYLENOL) suppository 650 mg  650 mg Rectal Q6H PRN Opyd, Lavone Neriimothy S, MD      . albuterol (PROVENTIL) (2.5 MG/3ML) 0.083% nebulizer solution 3 mL  3 mL Inhalation Q6H PRN Opyd, Lavone Neriimothy S, MD      . diclofenac sodium (VOLTAREN) 1 % transdermal gel 2 g  2 g Topical QID Opyd, Lavone Neriimothy S, MD   2 g at 04/27/18 1133  . enoxaparin (LOVENOX) injection 40 mg  40 mg Subcutaneous Q24H Opyd, Lavone Neriimothy S, MD   40 mg at 04/26/18 2132  . hydrALAZINE (APRESOLINE) injection 10 mg  10 mg  Intravenous Q4H PRN Opyd, Lavone Neri, MD      . lidocaine (LIDODERM) 5 % 1 patch  1 patch Transdermal Q24H Opyd, Lavone Neri, MD   1 patch at 04/27/18 1134  . ondansetron (ZOFRAN) tablet 4 mg  4 mg Oral Q6H PRN Opyd, Lavone Neri, MD       Or  . ondansetron (ZOFRAN) injection 4 mg  4 mg Intravenous Q6H PRN Opyd, Lavone Neri, MD         Discharge Medications: Please see discharge summary for a list of discharge medications.  Relevant Imaging Results:  Relevant Lab Results:   Additional Information     Ida Rogue, LCSW

## 2018-04-27 NOTE — Evaluation (Signed)
Clinical/Bedside Swallow Evaluation Patient Details  Name: Jennifer [name] MRN: [id number] Date of Birth: 1922/05/24  Today's Date: 04/27/2018 Time: SLP Start Time (ACUTE ONLY): 1135 SLP Stop Time (ACUTE ONLY): 1201 SLP Time Calculation (min) (ACUTE ONLY): 26 min  Past Medical History:  Past Medical History:  Diagnosis Date  . Anemia    iron deficiency post op.  Marland Kitchen. CAD (coronary artery disease) 2008   Cath 40% mid LAD, 35% RCA  . Cellulitis of finger    right   . Chest pain 2006  . Chronic back pain   . COPD (chronic obstructive pulmonary disease) (HCC)   . Gastroesophageal reflux disease   . Hiatal hernia   . Hyperlipidemia   . Memory deficit   . Nicotine addiction   . Osteoarthritis    status post left TKA; surgery on the right is anticipated in the near future   . Pulmonary nodules    stable since 2006  . Shingles    right breast   . Skin infection   . Ulcer    gential  . Use of cane as ambulatory aid    Past Surgical History:  Past Surgical History:  Procedure Laterality Date  . ABDOMINAL HYSTERECTOMY    . BREAST SURGERY N/A    bilateral  . CARDIAC CATHETERIZATION  2008   Mid LAD 40%, RCA 25%.   . CHOLECYSTECTOMY    . COMBINED HYSTERECTOMY ABDOMINAL W/ A&P REPAIR / OOPHORECTOMY  approx. 40 years ago   . EYE SURGERY    . MASTECTOMY  1998 & 2007    right -1998 / left 2007  . right knee replacement  09/2009   Dr. Romeo AppleHarrison  . TOTAL KNEE ARTHROPLASTY  4/08   left    HPI:  Jennifer [name] is a 85 y.o. female with medical history significant for dementia, CAD, COPD, osteoarthritis with chronic back and bilateral knee pain, now presenting to the emergency department for evaluation of lethargy, increased confusion, and anorexia.  History is obtained from the patient's daughter by phone, discussion with ED personnel, and review of the EMR.  Patient has been sleeping more, eating less, and more confused for close to 2 weeks now.  She was previously ambulatory, but has  not ambulated since 04/17/2018.  She has been eating very little, also drinking very little for the past week and daughter has had difficulty getting the patient to swallow her pills.  She has not been coughing, has not been febrile, and has not complained of anything other than her chronic back and bilateral knee pain.  No vomiting or diarrhea.  She was seen in the emergency department on 04/24/2018, there was some concern that she may have a UTI or pneumonia and she was treated with Levaquin.  Single set of blood cultures drawn on 04/24/2018 is growing coagulase-negative Staphylococcus. BSE requested.   Assessment / Plan / Recommendation Clinical Impression  Clinical swallow evaluation completed at bedside. Pt was alert and cooperative. Oral motor examination reveals U/L dentures which are slightly loose fitting. Pt self presented ~540 cc thin water via straw sips, 4 oz applesauce, and graham crackers without overt signs or symptoms of aspiration. Pt did present with some sneezing initially. Her daughter reports that Pt typically uses Flonase spray once per day and this was relayed to nursing. SLP FaceTimed daughter during visit secondary to COVID19 restrictions for visitors. Education provided related to dementia and dysphagia. Pt was unable to feed herself recently and was pocketing foods. SLP encouraged  daughter to have Pt self present foods as able when most alert and to avoid pushing foods/liquids if Pt is not alert/willing. Daughter expressed understanding and was appreciative of video call and education. Pt is likely to experience periods of inability to take foods by mouth given dementia. Pt's daughter will benefit from ongoing education regarding dementia and aging process. SLP will follow during acute stay and initiate D3/mech soft and thin liquids. PO medications whole with puree or with water.   SLP Visit Diagnosis: Dysphagia, unspecified (R13.10)    Aspiration Risk  Mild aspiration risk    Diet  Recommendation Dysphagia 3 (Mech soft);Thin liquid   Liquid Administration via: Cup;Straw Medication Administration: Whole meds with puree Supervision: Patient able to self feed;Full supervision/cueing for compensatory strategies Compensations: Slow rate;Small sips/bites Postural Changes: Seated upright at 90 degrees;Remain upright for at least 30 minutes after po intake    Other  Recommendations Oral Care Recommendations: Oral care BID;Staff/trained caregiver to provide oral care Other Recommendations: Clarify dietary restrictions   Follow up Recommendations 24 hour supervision/assistance      Frequency and Duration min 2x/week  1 week       Prognosis Prognosis for Safe Diet Advancement: Good Barriers to Reach Goals: Cognitive deficits      Swallow Study   General Date of Onset: 04/26/18 HPI: Jennifer [name] is a 85 y.o. female with medical history significant for dementia, CAD, COPD, osteoarthritis with chronic back and bilateral knee pain, now presenting to the emergency department for evaluation of lethargy, increased confusion, and anorexia.  History is obtained from the patient's daughter by phone, discussion with ED personnel, and review of the EMR.  Patient has been sleeping more, eating less, and more confused for close to 2 weeks now.  She was previously ambulatory, but has not ambulated since 04/17/2018.  She has been eating very little, also drinking very little for the past week and daughter has had difficulty getting the patient to swallow her pills.  She has not been coughing, has not been febrile, and has not complained of anything other than her chronic back and bilateral knee pain.  No vomiting or diarrhea.  She was seen in the emergency department on 04/24/2018, there was some concern that she may have a UTI or pneumonia and she was treated with Levaquin.  Single set of blood cultures drawn on 04/24/2018 is growing coagulase-negative Staphylococcus. BSE requested. Type of  Study: Bedside Swallow Evaluation Previous Swallow Assessment: MBS 2018 reg/thin Diet Prior to this Study: NPO Temperature Spikes Noted: No Respiratory Status: Room air History of Recent Intubation: No Behavior/Cognition: Alert;Cooperative;Pleasant mood Oral Cavity Assessment: Within Functional Limits Oral Care Completed by SLP: Recent completion by staff Oral Cavity - Dentition: Dentures, top;Dentures, bottom Vision: Functional for self-feeding Self-Feeding Abilities: Able to feed self;Needs set up Patient Positioning: Upright in bed Baseline Vocal Quality: Normal Volitional Cough: Strong Volitional Swallow: Able to elicit    Oral/Motor/Sensory Function Overall Oral Motor/Sensory Function: Within functional limits   Ice Chips Ice chips: Within functional limits Presentation: Spoon   Thin Liquid Thin Liquid: Within functional limits Presentation: Cup;Self Fed;Straw    Nectar Thick Nectar Thick Liquid: Not tested   Honey Thick Honey Thick Liquid: Not tested   Puree Puree: Within functional limits Presentation: Self Fed;Spoon   Solid     Solid: Within functional limits Presentation: Self Fed     Thank you,  Havery Moros, [name] [phone]  Kaylynne Andres 04/27/2018,12:51 PM

## 2018-04-27 NOTE — Progress Notes (Signed)
Initial Nutrition Assessment  DOCUMENTATION CODES:  Obesity unspecified  INTERVENTION:  Ensure Enlive po BID, each supplement provides 350 kcal and 20 grams of protein  Liquid MVI with minerals  If pt continues to have minimal oral intake, would consider hospice referral w/ oral intake as accepts. PEG not recommended in end stage dementia.   NUTRITION DIAGNOSIS:  Inadequate oral intake related to chronic illness (Dementia progression) as evidenced by per family report of pt having decreased responsiveness, level of intake, mobility and holding meds in her mouth.   GOAL:  Patient will meet greater than or equal to 90% of their needs   MONITOR:  PO intake, Labs, I & O's, Supplement acceptance, Diet advancement, Goals of care  REASON FOR ASSESSMENT:  Malnutrition Screening Tool    ASSESSMENT:  85 y/o female PMHx dementia, CAD, COPD, Chronic back/knee pain, GERD. Brought to ED by daughter d/t increased lethargy/confusion and decreased oral intake x2 weeks. Pt no longer ambulating. Daughter also having trouble getting pt to swallow meds. Workup in ED largely unremarkable. Admitted for further workup.   RD operating remotely d/t COVID precautions. Reviewing pts chart, pts s/s are consistent with those of dementia progression- anorexia, refusal of meds, increased lethargy etc.   Despite reported overall decline, pt has gained wt across the past fe months. She was 202 in January, but 218 when she weighed in ED a couple days back. She is currently 214 lbs. Long term, she was 194 lbs 18 months ago.   Pt just recently seen and evaluated by ST. SLP to place on D3, TL. Will order Ensure Enlive BID. If pt continues to not eat/drink, would consider palliative recommendation. PEG not recommended in end stage dementia.   Meds: None Labs: Reviewed, unremarkable  Recent Labs  Lab 04/24/18 1452 04/26/18 1440  NA 149* 144  K 4.9 3.5  CL 111 110  CO2 25 22  BUN 25* 26*  CREATININE 1.08* 0.78   CALCIUM 9.7 8.9  GLUCOSE 101* 82   NUTRITION - FOCUSED PHYSICAL EXAM: Unable to conduct  Diet Order:   Diet Order            Diet NPO time specified  Diet effective now             EDUCATION NEEDS:  No education needs have been identified at this time  Skin:  MASD under abdominal folds   Last BM:  Unknown  Height:  Ht Readings from Last 1 Encounters:  04/26/18 5\' 4"  (1.626 m)   Weight:  Wt Readings from Last 1 Encounters:  04/26/18 98.9 kg   Wt Readings from Last 10 Encounters:  04/26/18 98.9 kg  04/24/18 98.9 kg  01/24/18 91.9 kg  01/23/18 86.2 kg  11/08/17 93.5 kg  09/16/17 93.4 kg  08/30/17 93.5 kg  05/06/17 91.6 kg  04/28/17 88 kg  12/08/16 88 kg   Ideal Body Weight:  54.54 kg  BMI:  Body mass index is 37.43 kg/m.  Estimated Nutritional Needs:  Kcal:  1400-1600 (14-16kcal/kg bw) Protein:  65-75g Pro (1.2-1.4g/kg ibw) Fluid:  >1.6 L (1 ml/kcal)  Christophe Louis RD, LDN, CNSC Clinical Nutrition Available Tues-Sat via Pager: [phone] 04/27/2018 1:11 PM

## 2018-04-27 NOTE — Telephone Encounter (Signed)
Called Gwyn to let her know I was cancelling appt for tomorrow and check on Ms. Gangi. She stated they admitted her for 2 day observation at [facility]. Told her to let us know if she needed [place] and we would follow up when she was out of the hospital. She requested a call from Dr. Lodema Hong. I told her that Tereasa Coop NP was covering for Dr. Lodema Hong. She stated she wanted a phone call back as she just doesn't know what she needs to do and felt like Dr. Lodema Hong should be checking on her.

## 2018-04-27 NOTE — TOC Progression Note (Addendum)
Transition of Care [facility]) - Progression Note    Patient Details  Name: Jennifer [name] MRN: [id number] Date of Birth: October 05, 1922  [facility] CM/SW Contact  Ida Rogue, [place] Phone Number: 04/27/2018, 12:18 PM  Clinical Narrative:   Spoke with daughter who is Healthcare POA, not guardian, about recommendation of PT for rehab. Daughter states the only place she would want her to go would be [facility], and asked me to refer there.  Referral completed.Daughter later called and asked me to expand search to [facility] and [facility] if [facility] declines.    Expected Discharge Plan: Home w Home Health Services    Expected Discharge Plan and Services Expected Discharge Plan: Home w Home Health Services In-house Referral: Clinical Social Work(Pt's daugter interested in in-home aid services )     Living arrangements for the past 2 months: Single Family Home(under the care and supervision of her daughter Jennifer [name] )                           Social Determinants of Health (SDOH) Interventions    Readmission Risk Interventions No flowsheet data found.

## 2018-04-27 NOTE — Evaluation (Signed)
Physical Therapy Evaluation Patient Details Name: Jennifer BeetsVelma M [name] MRN: [id number] DOB: 20-Jun-1922 Today's Date: 04/27/2018   History of Present Illness  Jennifer [name] is a 85 y.o. female with medical history significant for dementia, CAD, COPD, osteoarthritis with chronic back and bilateral knee pain, now presenting to the emergency department for evaluation of lethargy, increased confusion, and anorexia.  History is obtained from the patient's daughter by phone, discussion with ED personnel, and review of the EMR.  Patient has been sleeping more, eating less, and more confused for close to 2 weeks now.  She was previously ambulatory, but has not ambulated since 04/17/2018.  She has been eating very little, also drinking very little for the past week and daughter has had difficulty getting the patient to swallow her pills.  She has not been coughing, has not been febrile, and has not complained of anything other than her chronic back and bilateral knee pain.  No vomiting or diarrhea.  She was seen in the emergency department on 04/24/2018, there was some concern that she may have a UTI or pneumonia and she was treated with Levaquin.  Single set of blood cultures drawn on 04/24/2018 is growing coagulase-negative Staphylococcus.    Clinical Impression  Patient demonstrates slow labored movement and much assistance to sit up at bedside, unable to stand due to BLE weakness and fatigue.  Patient required Max/total assist to reposition when put back to bed with bed in head down position.  Patient will benefit from continued physical therapy in hospital and recommended venue below to increase strength, balance, endurance for safe ADLs and gait.    Follow Up Recommendations SNF    Equipment Recommendations       Recommendations for Other Services       Precautions / Restrictions Precautions Precautions: Fall Restrictions Weight Bearing Restrictions: No      Mobility  Bed Mobility Overal bed  mobility: Needs Assistance Bed Mobility: Supine to Sit;Sit to Supine     Supine to sit: Max assist Sit to supine: Max assist   General bed mobility comments: slow labored movement  Transfers                    Ambulation/Gait                Stairs            Wheelchair Mobility    Modified Rankin (Stroke Patients Only)       Balance Overall balance assessment: Needs assistance Sitting-balance support: Feet supported;No upper extremity supported Sitting balance-Leahy Scale: Fair         Standing balance comment: unable to stand due to weakness                             Pertinent Vitals/Pain Pain Assessment: Faces Faces Pain Scale: Hurts little more Pain Location: low back and legs with movement Pain Descriptors / Indicators: Grimacing;Discomfort Pain Intervention(s): Limited activity within patient's tolerance;Monitored during session    Home Living Family/patient expects to be discharged to:: Private residence Living Arrangements: Children Available Help at Discharge: Family Type of Home: House       Home Layout: Multi-level;Able to live on main level with bedroom/bathroom Home Equipment: Dan HumphreysWalker - 2 wheels;Wheelchair - manual Additional Comments: Patient is poor historian, info obtained previous admission notes    Prior Function Level of Independence: Needs assistance   Gait / Transfers Assistance Needed: household ambulator with  RW, has not ambulated since 04/17/18 per HPI notes in chart  ADL's / Homemaking Assistance Needed: assisted by family        Hand Dominance   Dominant Hand: Right    Extremity/Trunk Assessment   Upper Extremity Assessment Upper Extremity Assessment: Generalized weakness    Lower Extremity Assessment Lower Extremity Assessment: Generalized weakness    Cervical / Trunk Assessment Cervical / Trunk Assessment: Kyphotic  Communication   Communication: HOH  Cognition  Arousal/Alertness: Awake/alert Behavior During Therapy: Flat affect Overall Cognitive Status: History of cognitive impairments - at baseline                                 General Comments: very HOH required repeated tactile/verbal cueing      General Comments      Exercises     Assessment/Plan    PT Assessment Patient needs continued PT services  PT Problem List Decreased strength;Decreased activity tolerance;Decreased balance;Decreased mobility       PT Treatment Interventions Therapeutic exercise;Gait training;Stair training;Functional mobility training;Therapeutic activities;Patient/family education    PT Goals (Current goals can be found in the Care Plan section)  Acute Rehab PT Goals Patient Stated Goal: return home PT Goal Formulation: With patient Time For Goal Achievement: 05/11/18 Potential to Achieve Goals: Good    Frequency Min 3X/week   Barriers to discharge        Co-evaluation               AM-PAC PT "6 Clicks" Mobility  Outcome Measure Help needed turning from your back to your side while in a flat bed without using bedrails?: A Lot Help needed moving from lying on your back to sitting on the side of a flat bed without using bedrails?: A Lot Help needed moving to and from a bed to a chair (including a wheelchair)?: Total Help needed standing up from a chair using your arms (e.g., wheelchair or bedside chair)?: Total Help needed to walk in hospital room?: Total Help needed climbing 3-5 steps with a railing? : Total 6 Click Score: 8    End of Session Equipment Utilized During Treatment: Gait belt Activity Tolerance: Patient limited by fatigue;Patient tolerated treatment well Patient left: in bed;with call bell/phone within reach Nurse Communication: Mobility status PT Visit Diagnosis: Unsteadiness on feet (R26.81);Other abnormalities of gait and mobility (R26.89);Muscle weakness (generalized) (M62.81)    Time: 6160-7371 PT  Time Calculation (min) (ACUTE ONLY): 32 min   Charges:   PT Evaluation $PT Eval Moderate Complexity: 1 Mod PT Treatments $Therapeutic Activity: 23-37 mins        9:18 AM, 04/27/18 Ocie Bob, MPT Physical Therapist with [facility] [phone] office [phone] mobile phone

## 2018-04-28 ENCOUNTER — Ambulatory Visit: Payer: Medicare Other | Admitting: Family Medicine

## 2018-04-28 DIAGNOSIS — R1312 Dysphagia, oropharyngeal phase: Secondary | ICD-10-CM

## 2018-04-28 DIAGNOSIS — M6281 Muscle weakness (generalized): Secondary | ICD-10-CM | POA: Diagnosis not present

## 2018-04-28 DIAGNOSIS — Z6837 Body mass index (BMI) 37.0-37.9, adult: Secondary | ICD-10-CM | POA: Diagnosis not present

## 2018-04-28 DIAGNOSIS — M170 Bilateral primary osteoarthritis of knee: Secondary | ICD-10-CM | POA: Diagnosis present

## 2018-04-28 DIAGNOSIS — F0280 Dementia in other diseases classified elsewhere without behavioral disturbance: Secondary | ICD-10-CM | POA: Diagnosis not present

## 2018-04-28 DIAGNOSIS — E876 Hypokalemia: Secondary | ICD-10-CM | POA: Diagnosis not present

## 2018-04-28 DIAGNOSIS — M810 Age-related osteoporosis without current pathological fracture: Secondary | ICD-10-CM | POA: Diagnosis present

## 2018-04-28 DIAGNOSIS — R5381 Other malaise: Secondary | ICD-10-CM

## 2018-04-28 DIAGNOSIS — R32 Unspecified urinary incontinence: Secondary | ICD-10-CM | POA: Diagnosis not present

## 2018-04-28 DIAGNOSIS — Z96653 Presence of artificial knee joint, bilateral: Secondary | ICD-10-CM | POA: Diagnosis present

## 2018-04-28 DIAGNOSIS — Z791 Long term (current) use of non-steroidal anti-inflammatories (NSAID): Secondary | ICD-10-CM | POA: Diagnosis not present

## 2018-04-28 DIAGNOSIS — F0390 Unspecified dementia without behavioral disturbance: Secondary | ICD-10-CM | POA: Diagnosis present

## 2018-04-28 DIAGNOSIS — M549 Dorsalgia, unspecified: Secondary | ICD-10-CM | POA: Diagnosis present

## 2018-04-28 DIAGNOSIS — M4800 Spinal stenosis, site unspecified: Secondary | ICD-10-CM | POA: Diagnosis not present

## 2018-04-28 DIAGNOSIS — Z66 Do not resuscitate: Secondary | ICD-10-CM | POA: Diagnosis present

## 2018-04-28 DIAGNOSIS — K449 Diaphragmatic hernia without obstruction or gangrene: Secondary | ICD-10-CM | POA: Diagnosis not present

## 2018-04-28 DIAGNOSIS — R410 Disorientation, unspecified: Secondary | ICD-10-CM | POA: Diagnosis not present

## 2018-04-28 DIAGNOSIS — Z79899 Other long term (current) drug therapy: Secondary | ICD-10-CM | POA: Diagnosis not present

## 2018-04-28 DIAGNOSIS — R404 Transient alteration of awareness: Secondary | ICD-10-CM | POA: Diagnosis not present

## 2018-04-28 DIAGNOSIS — I10 Essential (primary) hypertension: Secondary | ICD-10-CM | POA: Diagnosis present

## 2018-04-28 DIAGNOSIS — G9340 Encephalopathy, unspecified: Secondary | ICD-10-CM | POA: Diagnosis not present

## 2018-04-28 DIAGNOSIS — K5900 Constipation, unspecified: Secondary | ICD-10-CM | POA: Diagnosis not present

## 2018-04-28 DIAGNOSIS — M150 Primary generalized (osteo)arthritis: Secondary | ICD-10-CM | POA: Diagnosis not present

## 2018-04-28 DIAGNOSIS — Z888 Allergy status to other drugs, medicaments and biological substances status: Secondary | ICD-10-CM | POA: Diagnosis not present

## 2018-04-28 DIAGNOSIS — J309 Allergic rhinitis, unspecified: Secondary | ICD-10-CM | POA: Diagnosis present

## 2018-04-28 DIAGNOSIS — I2510 Atherosclerotic heart disease of native coronary artery without angina pectoris: Secondary | ICD-10-CM | POA: Diagnosis present

## 2018-04-28 DIAGNOSIS — Z87891 Personal history of nicotine dependence: Secondary | ICD-10-CM | POA: Diagnosis not present

## 2018-04-28 DIAGNOSIS — E785 Hyperlipidemia, unspecified: Secondary | ICD-10-CM | POA: Diagnosis not present

## 2018-04-28 DIAGNOSIS — R278 Other lack of coordination: Secondary | ICD-10-CM | POA: Diagnosis not present

## 2018-04-28 DIAGNOSIS — G301 Alzheimer's disease with late onset: Secondary | ICD-10-CM | POA: Diagnosis not present

## 2018-04-28 DIAGNOSIS — Z743 Need for continuous supervision: Secondary | ICD-10-CM | POA: Diagnosis not present

## 2018-04-28 DIAGNOSIS — G8929 Other chronic pain: Secondary | ICD-10-CM | POA: Diagnosis present

## 2018-04-28 DIAGNOSIS — R1311 Dysphagia, oral phase: Secondary | ICD-10-CM | POA: Diagnosis not present

## 2018-04-28 DIAGNOSIS — R41841 Cognitive communication deficit: Secondary | ICD-10-CM | POA: Diagnosis not present

## 2018-04-28 DIAGNOSIS — G9349 Other encephalopathy: Secondary | ICD-10-CM | POA: Diagnosis present

## 2018-04-28 DIAGNOSIS — H04129 Dry eye syndrome of unspecified lacrimal gland: Secondary | ICD-10-CM | POA: Diagnosis present

## 2018-04-28 DIAGNOSIS — R4702 Dysphasia: Secondary | ICD-10-CM | POA: Diagnosis not present

## 2018-04-28 DIAGNOSIS — R7881 Bacteremia: Secondary | ICD-10-CM | POA: Diagnosis not present

## 2018-04-28 DIAGNOSIS — R630 Anorexia: Secondary | ICD-10-CM | POA: Diagnosis not present

## 2018-04-28 DIAGNOSIS — Z9181 History of falling: Secondary | ICD-10-CM | POA: Diagnosis not present

## 2018-04-28 DIAGNOSIS — R29810 Facial weakness: Secondary | ICD-10-CM | POA: Diagnosis present

## 2018-04-28 DIAGNOSIS — K219 Gastro-esophageal reflux disease without esophagitis: Secondary | ICD-10-CM | POA: Diagnosis not present

## 2018-04-28 DIAGNOSIS — R627 Adult failure to thrive: Secondary | ICD-10-CM | POA: Diagnosis present

## 2018-04-28 DIAGNOSIS — E860 Dehydration: Secondary | ICD-10-CM | POA: Diagnosis present

## 2018-04-28 DIAGNOSIS — J449 Chronic obstructive pulmonary disease, unspecified: Secondary | ICD-10-CM | POA: Diagnosis present

## 2018-04-28 DIAGNOSIS — R1310 Dysphagia, unspecified: Secondary | ICD-10-CM | POA: Diagnosis present

## 2018-04-28 DIAGNOSIS — Z880 Allergy status to penicillin: Secondary | ICD-10-CM | POA: Diagnosis not present

## 2018-04-28 LAB — BASIC METABOLIC PANEL
Anion gap: 10 (ref 5–15)
BUN: 24 mg/dL — ABNORMAL HIGH (ref 8–23)
CO2: 25 mmol/L (ref 22–32)
Calcium: 8.5 mg/dL — ABNORMAL LOW (ref 8.9–10.3)
Chloride: 109 mmol/L (ref 98–111)
Creatinine, Ser: 0.65 mg/dL (ref 0.44–1.00)
GFR calc Af Amer: >60 mL/min (ref >60)
GFR calc non Af Amer: >60 mL/min (ref >60)
Glucose, Bld: 99 mg/dL (ref 70–99)
Potassium: 3.1 mmol/L — ABNORMAL LOW (ref 3.5–5.1)
Sodium: 144 mmol/L (ref 135–145)

## 2018-04-28 LAB — GLUCOSE, CAPILLARY
Glucose-Capillary: 66 mg/dL — ABNORMAL LOW (ref 70–99)
Glucose-Capillary: 75 mg/dL (ref 70–99)

## 2018-04-28 LAB — HIV ANTIBODY (ROUTINE TESTING W REFLEX): HIV Screen 4th Generation wRfx: NONREACTIVE

## 2018-04-28 LAB — RPR: RPR Ser Ql: NONREACTIVE

## 2018-04-28 MED ORDER — POTASSIUM CHLORIDE CRYS ER 20 MEQ PO TBCR
40.0000 meq | EXTENDED_RELEASE_TABLET | ORAL | Status: AC
  Administered 2018-04-28 (×2): 40 meq via ORAL
  Filled 2018-04-28 (×2): qty 2

## 2018-04-28 NOTE — Progress Notes (Signed)
Physical Therapy Treatment Patient Details Name: Jennifer [name] MRN: [id number] DOB: 05-Oct-1922 Today's Date: 04/28/2018    History of Present Illness Jennifer [name] is a 85 y.o. female with medical history significant for dementia, CAD, COPD, osteoarthritis with chronic back and bilateral knee pain, now presenting to the emergency department for evaluation of lethargy, increased confusion, and anorexia.  History is obtained from the patient's daughter by phone, discussion with ED personnel, and review of the EMR.  Patient has been sleeping more, eating less, and more confused for close to 2 weeks now.  She was previously ambulatory, but has not ambulated since 04/17/2018.  She has been eating very little, also drinking very little for the past week and daughter has had difficulty getting the patient to swallow her pills.  She has not been coughing, has not been febrile, and has not complained of anything other than her chronic back and bilateral knee pain.  No vomiting or diarrhea.  She was seen in the emergency department on 04/24/2018, there was some concern that she may have a UTI or pneumonia and she was treated with Levaquin.  Single set of blood cultures drawn on 04/24/2018 is growing coagulase-negative Staphylococcus.    PT Comments    Pt supine in bed and willing to participate with therapy today.  Pt improving mechanics with bed mobility and fair sitting balance.  Pt followed cueing to assist with bed mobility correctly with mod A required.  Okay sitting balance without HHA, required min A and cueing to reduce lean to Rt.  Seated LE exercises complete for LE strengthening.  EOS pt limited by fatigue.  Pt left in bed with call bell within reach.      Follow Up Recommendations  SNF     Equipment Recommendations       Recommendations for Other Services       Precautions / Restrictions Precautions Precautions: Fall    Mobility  Bed Mobility Overal bed mobility: Needs Assistance Bed  Mobility: Supine to Sit;Sit to Supine     Supine to sit: Mod assist Sit to supine: Min assist   General bed mobility comments: slow labored movement; cueing for handplacement to reach towards handrails to assist with supine to sit  Transfers                    Ambulation/Gait                 Stairs             Wheelchair Mobility    Modified Rankin (Stroke Patients Only)       Balance                                            Cognition Arousal/Alertness: Awake/alert Behavior During Therapy: WFL for tasks assessed/performed Overall Cognitive Status: Within Functional Limits for tasks assessed                                 General Comments: very HOH required repeated tactile/verbal cueing      Exercises General Exercises - Lower Extremity Ankle Circles/Pumps: Both;10 reps;Seated Long Arc Quad: Both;10 reps;Seated    General Comments        Pertinent Vitals/Pain Pain Assessment: No/denies pain    Home Living  Prior Function            PT Goals (current goals can now be found in the care plan section)      Frequency    Min 3X/week      PT Plan      Co-evaluation              AM-PAC PT "6 Clicks" Mobility   Outcome Measure  Help needed turning from your back to your side while in a flat bed without using bedrails?: A Lot Help needed moving from lying on your back to sitting on the side of a flat bed without using bedrails?: A Lot Help needed moving to and from a bed to a chair (including a wheelchair)?: Total Help needed standing up from a chair using your arms (e.g., wheelchair or bedside chair)?: Total Help needed to walk in hospital room?: Total Help needed climbing 3-5 steps with a railing? : Total 6 Click Score: 8    End of Session Equipment Utilized During Treatment: Gait belt Activity Tolerance: Patient limited by fatigue;Patient tolerated  treatment well Patient left: in bed;with call bell/phone within reach Nurse Communication: Mobility status PT Visit Diagnosis: Unsteadiness on feet (R26.81);Other abnormalities of gait and mobility (R26.89);Muscle weakness (generalized) (M62.81)     Time: 8099-8338 PT Time Calculation (min) (ACUTE ONLY): 21 min  Charges:  $Therapeutic Activity: 8-22 mins                     [address], LPTA; CBIS [phone]  Juel Burrow 04/28/2018, 6:04 PM

## 2018-04-28 NOTE — Progress Notes (Signed)
PROGRESS NOTE    Jennifer [name]  VHQ:[id number] DOB: November 23, 1922 DOA: 04/26/2018 PCP: [name], MD     Brief Narrative:  85 y.o. female with medical history significant for dementia, CAD, COPD, osteoarthritis with chronic back and bilateral knee pain, now presenting to the emergency department for evaluation of lethargy, increased confusion, and anorexia.  History is obtained from the patient's daughter by phone, discussion with ED personnel, and review of the EMR.  Patient has been sleeping more, eating less, and more confused for close to 2 weeks now.  She was previously ambulatory, but has not ambulated since 04/17/2018.  She has been eating very little, also drinking very little for the past week and daughter has had difficulty getting the patient to swallow her pills.  She has not been coughing, has not been febrile, and has not complained of anything other than her chronic back and bilateral knee pain.  No vomiting or diarrhea.  She was seen in the emergency department on 04/24/2018, there was some concern that she may have a UTI or pneumonia and she was treated with Levaquin.  Single set of blood cultures drawn on 04/24/2018 is growing coagulase-negative Staphylococcus.   Assessment & Plan: 1-Acute encephalopathy: In the setting of further progression of her dementia and underlying dehydration. -Patient recently also started on Levaquin which could potentially be responsible for changes in mentation as well. -Given no signs of acute infection appreciated on x-ray or urinalysis/recent cultures.  Will discontinue antibiotics -Continue IV fluid resuscitation -Slowly advance diet as indicated by speech therapy. -Physical therapy has found patient adequate for rehabilitation and conditioning at the skilled nursing facility.  2-dysphasia -Following recommendation by speech therapy will continue using dysphagia 3 with thin liquids. -so far well tolerated.   3-COPD (chronic obstructive  pulmonary disease) (HCC) -Currently no wheezing and with good oxygen saturation on room air -Continue as needed nebulizers.  4-allergy rhinitis/dry eye syndrome -Resume home eyedrops and Flonase.  5-Dementia without behavioral disturbance (HCC) -Continue supportive care -Per physical therapy evaluation will benefit of skilled nursing facility rehabilitation at discharge.  6-positive blood culture -1 out of 2 coagulase-negative staph -Contaminant in nature -Discontinue vancomycin -No signs of acute infection appreciated at this time. -Will observe off antibiotics.  7-dehydration with failure to thrive -Continue IV fluids -Most likely associated with progression of dementia -Following dietitian recommendation will use Ensure twice a day.  8-hypokalemia -Replete electrolytes and follow trend in a.m.  DVT prophylaxis: Lovenox Code Status: DNR Family Communication: No family at bedside. Disposition Plan: Physical therapy recommending SNF at discharge.  For now we will continue fluid resuscitation and supportive care. Replete electrolytes and follow BMET in am.  Consultants:   None  Procedures:   See below for x-ray reports.  Antimicrobials:  Anti-infectives (From admission, onward)   Start     Dose/Rate Route Frequency Ordered Stop   04/27/18 2200  vancomycin (VANCOCIN) 1,500 mg in sodium chloride 0.9 % 500 mL IVPB  Status:  Discontinued     1,500 mg 250 mL/hr over 120 Minutes Intravenous Every 24 hours 04/26/18 2109 04/27/18 0832   04/26/18 2130  vancomycin (VANCOCIN) IVPB 1000 mg/200 mL premix     1,000 mg 200 mL/hr over 60 Minutes Intravenous Every 1 hr x 2 04/26/18 2056 04/26/18 2329       Subjective: In no acute distress; afebrile, no shortness of breath, no nausea, no vomiting.  Patient is more alert, following commands appropriately and tolerating dysphagia 3 diet.  Patient  is generally weak and requiring assistance to transfer (as per discussion with daughter,  not her baseline).  Objective: Vitals:   04/27/18 1946 04/27/18 2140 04/28/18 0511 04/28/18 1427  BP:  114/90 130/67 (!) 142/77  Pulse:  (!) 104 72 78  Resp:  16 20   Temp:  98.8 F (37.1 C) 98.1 F (36.7 C) 98.5 F (36.9 C)  TempSrc:  Oral Oral Oral  SpO2: 92% 98% 100% 100%  Weight:      Height:        Intake/Output Summary (Last 24 hours) at 04/28/2018 1540 Last data filed at 04/28/2018 1300 Gross per 24 hour  Intake 1613.09 ml  Output -  Net 1613.09 ml   Filed Weights   04/26/18 1253  Weight: 98.9 kg    Examination: General exam: Alert, awake, oriented x 2, pleasantly confused and in no acute distress.  Patient is afebrile, denies nausea, vomiting, chest pain, and abdominal pain.  She expressed having some pain in her knees and bilaterally (which is chronic and intermittent).  Patient has demonstrated ability to maintain good oral intake and hydration following dysphagia 3 diet. Respiratory system: Clear to auscultation. Respiratory effort normal. Cardiovascular system:RRR. No murmurs, rubs, gallops. Gastrointestinal system: Abdomen is nondistended, soft and nontender. No organomegaly or masses felt. Normal bowel sounds heard. Central nervous system: Alert and oriented. No focal neurological deficits. Extremities: No cyanosis or clubbing. Skin: No rashes, lesions or ulcers Psychiatry: Stable mood.  No suicidal ideation or hallucinations.  Patient pleasantly confused at baseline due to underlying dementia.  Data Reviewed: I have personally reviewed following labs and imaging studies  CBC: Recent Labs  Lab 04/24/18 1452 04/26/18 1440 04/27/18 1333  WBC 6.3 7.0 6.1  NEUTROABS 4.3 4.5 4.1  HGB 14.4 13.5 13.2  HCT 47.4* 43.9 42.6  MCV 98.1 98.2 97.7  PLT 176 164 135*   Basic Metabolic Panel: Recent Labs  Lab 04/24/18 1452 04/26/18 1440 04/27/18 1333 04/28/18 0532  NA 149* 144 143 144  K 4.9 3.5 3.4* 3.1*  CL 111 110 109 109  CO2 GLUCOSE 101*  82 107* 99  BUN 25* 26* 22 24*  CREATININE 1.08* 0.78 0.67 0.65  CALCIUM 9.7 8.9 8.7* 8.5*   GFR: Estimated Creatinine Clearance: 49.1 mL/min (by C-G formula based on SCr of 0.65 mg/dL).   Liver Function Tests: Recent Labs  Lab 04/24/18 1452 04/26/18 1440  AST 25 19  ALT 14 12  ALKPHOS 86 87  BILITOT 1.3* 0.9  PROT 7.6 7.2  ALBUMIN 4.0 3.7    Recent Labs  Lab 04/24/18 1500  AMMONIA 13   Cardiac Enzymes: Recent Labs  Lab 04/24/18 1452 04/26/18 1440  TROPONINI <0.03 <0.03   Thyroid Function Tests: Recent Labs    04/26/18 1935  TSH 1.041   Anemia Panel: Recent Labs    04/26/18 1935  VITAMINB12 871   Urine analysis:    Component Value Date/Time   COLORURINE AMBER (A) 04/26/2018 1450   APPEARANCEUR HAZY (A) 04/26/2018 1450   LABSPEC 1.028 04/26/2018 1450   PHURINE 5.0 04/26/2018 1450   GLUCOSEU NEGATIVE 04/26/2018 1450   GLUCOSEU NEG mg/dL 16/10/9602 5409   HGBUR NEGATIVE 04/26/2018 1450   HGBUR negative 05/02/2008 1547   BILIRUBINUR NEGATIVE 04/26/2018 1450   BILIRUBINUR small 12/05/2012 1324   KETONESUR 20 (A) 04/26/2018 1450   PROTEINUR 30 (A) 04/26/2018 1450   UROBILINOGEN 1.0 12/05/2012 1324   UROBILINOGEN 0.2 05/02/2008 1547   NITRITE NEGATIVE  04/26/2018 1450   LEUKOCYTESUR NEGATIVE 04/26/2018 1450    Recent Results (from the past 240 hour(s))  Urine Culture     Status: None   Collection Time: 04/24/18  2:32 PM  Result Value Ref Range Status   Specimen Description   Final    URINE, CATHETERIZED Performed at [facility], [address] [place], [place] [address]    Special Requests   Final    NONE Performed at [facility], [address], [place], [place] [address]    Culture   Final    Multiple bacterial morphotypes present, none predominant. Suggest appropriate recollection if clinically indicated.   Report Status 04/25/2018 FINAL  Final  Blood culture (routine x 2)     Status: Abnormal   Collection Time: 04/24/18  3:01 PM  Result  Value Ref Range Status   Specimen Description   Final    BLOOD RIGHT ARM Performed at [facility], [address] [place], [place] [address]    Special Requests   Final    BOTTLES DRAWN AEROBIC AND ANAEROBIC Blood Culture adequate volume Performed at [facility], [address] [place], [place] [address]    Culture  Setup Time   Final    GRAM POSITIVE COCCI ANAEROBIC BOTTLE Gram Stain Report Called to,Read Back By and Verified With: SIMMS M. AT 1035A ON [id number] BY THOMPSON S. CRITICAL RESULT CALLED TO, READ BACK BY AND VERIFIED WITH: A. ADKINS, RN (MCHP) AT 1710 ON 04/25/18 BY C. JESSUP, MLT.    Culture (A)  Final    STAPHYLOCOCCUS SPECIES (COAGULASE NEGATIVE) THE SIGNIFICANCE OF ISOLATING THIS ORGANISM FROM A SINGLE SET OF BLOOD CULTURES WHEN MULTIPLE SETS ARE DRAWN IS UNCERTAIN. PLEASE NOTIFY THE MICROBIOLOGY DEPARTMENT WITHIN ONE WEEK IF SPECIATION AND SENSITIVITIES ARE REQUIRED. Performed at [facility], [address], [place], [place] [address]    Report Status 04/26/2018 FINAL  Final  Blood culture (routine x 2)     Status: None (Preliminary result)   Collection Time: 04/24/18  3:01 PM  Result Value Ref Range Status   Specimen Description BLOOD LEFT ARM  Final   Special Requests   Final    BOTTLES DRAWN AEROBIC AND ANAEROBIC Blood Culture adequate volume   Culture   Final    NO GROWTH 4 DAYS Performed at [facility], [address], [place], [place] [address]    Report Status PENDING  Incomplete  Blood Culture ID Panel (Reflexed)     Status: Abnormal   Collection Time: 04/24/18  3:01 PM  Result Value Ref Range Status   Enterococcus species NOT DETECTED NOT DETECTED Final   Listeria monocytogenes NOT DETECTED NOT DETECTED Final   Staphylococcus species DETECTED (A) NOT DETECTED Final    Comment: Methicillin (oxacillin) susceptible coagulase negative staphylococcus. Possible blood culture contaminant (unless isolated from more than one blood culture draw or clinical  case suggests pathogenicity). No antibiotic treatment is indicated for blood  culture contaminants. CRITICAL RESULT CALLED TO, READ BACK BY AND VERIFIED WITH: A. ADKINS, RN (MCHP) AT 1710 ON 04/25/18 BY C. JESSUP, MLT.    Staphylococcus aureus (BCID) NOT DETECTED NOT DETECTED Final   Methicillin resistance NOT DETECTED NOT DETECTED Final   Streptococcus species NOT DETECTED NOT DETECTED Final   Streptococcus agalactiae NOT DETECTED NOT DETECTED Final   Streptococcus pneumoniae NOT DETECTED NOT DETECTED Final   Streptococcus pyogenes NOT DETECTED NOT DETECTED Final   Acinetobacter baumannii NOT DETECTED NOT DETECTED Final   Enterobacteriaceae species NOT  DETECTED NOT DETECTED Final   Enterobacter cloacae complex NOT DETECTED NOT DETECTED Final   Escherichia coli NOT DETECTED NOT DETECTED Final   Klebsiella oxytoca NOT DETECTED NOT DETECTED Final   Klebsiella pneumoniae NOT DETECTED NOT DETECTED Final   Proteus species NOT DETECTED NOT DETECTED Final   Serratia marcescens NOT DETECTED NOT DETECTED Final   Haemophilus influenzae NOT DETECTED NOT DETECTED Final   Neisseria meningitidis NOT DETECTED NOT DETECTED Final   Pseudomonas aeruginosa NOT DETECTED NOT DETECTED Final   Candida albicans NOT DETECTED NOT DETECTED Final   Candida glabrata NOT DETECTED NOT DETECTED Final   Candida krusei NOT DETECTED NOT DETECTED Final   Candida parapsilosis NOT DETECTED NOT DETECTED Final   Candida tropicalis NOT DETECTED NOT DETECTED Final    Comment: Performed at [facility], [address] [place], [place] [address]  Culture, blood (routine x 2)     Status: None (Preliminary result)   Collection Time: 04/26/18  2:40 PM  Result Value Ref Range Status   Specimen Description RIGHT ANTECUBITAL  Final   Special Requests   Final    BOTTLES DRAWN AEROBIC AND ANAEROBIC Blood Culture adequate volume   Culture   Final    NO GROWTH 2 DAYS Performed at [facility], [address] [place],  [place] [address]    Report Status PENDING  Incomplete  Urine culture     Status: None   Collection Time: 04/26/18  2:50 PM  Result Value Ref Range Status   Specimen Description   Final    URINE, CATHETERIZED Performed at [facility], [address] [place], [place] [address]    Special Requests   Final    NONE Performed at [facility], [address] [place], [place] [address]    Culture   Final    NO GROWTH Performed at [facility], [address] [place], [place] [address]    Report Status 04/27/2018 FINAL  Final  Culture, blood (routine x 2)     Status: None (Preliminary result)   Collection Time: 04/26/18  3:04 PM  Result Value Ref Range Status   Specimen Description BLOOD RIGHT HAND  Final   Special Requests   Final    BOTTLES DRAWN AEROBIC ONLY Blood Culture adequate volume   Culture   Final    NO GROWTH 2 DAYS Performed at [facility], [address] [place], [place] [address]    Report Status PENDING  Incomplete     Radiology Studies: No results found.  Scheduled Meds: . diclofenac sodium  2 g Topical QID  . enoxaparin (LOVENOX) injection  40 mg Subcutaneous Q24H  . feeding supplement (ENSURE ENLIVE)  237 mL Oral BID BM  . fluticasone  1 spray Each Nare Daily  . lidocaine  1 patch Transdermal Q24H  . multivitamin  15 mL Oral Daily  . sodium chloride flush  10-40 mL Intracatheter Q12H   Continuous Infusions:    LOS: 0 days    Time spent: 25 minutes    Vassie Lollarlos Makhia Vosler, MD [facility] Pager [phone]   04/28/2018, 3:40 PM

## 2018-04-28 NOTE — Progress Notes (Signed)
  Speech Language Pathology Treatment: Dysphagia  Patient Details Name: Jennifer [name] MRN: [id number] DOB: [birth year] Today's Date: 04/28/2018 Time: 5056-9794 SLP Time Calculation (min) (ACUTE ONLY): 28 min  Assessment / Plan / Recommendation Clinical Impression  Pt seen for ongoing diagnostic dysphagia intervention following her clinical swallow evaluation completed yesterday. RN reports that Pt consumed her breakfast tray without incident. RN provided potassium pills to Pt and Pt able to self present once placed in her hand and swallowed with straw sips of thin water. Pt initiated self feeding of graham crackers and strawberry Ensure. Pt without overt signs or symptoms of aspiration during self presentation and when seated upright. Pt's daughter requested that we FaceTime and SLP was able to do this with Pt/daughter. Education provided and daughter expressed appreciation for the call. Continue diet as ordered. SLP will sign off. Reconsult if indicated.    HPI HPI: Jennifer [name] is a 85 y.o. female with medical history significant for dementia, CAD, COPD, osteoarthritis with chronic back and bilateral knee pain, now presenting to the emergency department for evaluation of lethargy, increased confusion, and anorexia.  History is obtained from the patient's daughter by phone, discussion with ED personnel, and review of the EMR.  Patient has been sleeping more, eating less, and more confused for close to 2 weeks now.  She was previously ambulatory, but has not ambulated since 04/17/2018.  She has been eating very little, also drinking very little for the past week and daughter has had difficulty getting the patient to swallow her pills.  She has not been coughing, has not been febrile, and has not complained of anything other than her chronic back and bilateral knee pain.  No vomiting or diarrhea.  She was seen in the emergency department on 04/24/2018, there was some concern that she may have a UTI or  pneumonia and she was treated with Levaquin.  Single set of blood cultures drawn on 04/24/2018 is growing coagulase-negative Staphylococcus. BSE requested.      SLP Plan  Discharge SLP treatment due to (comment);All goals met       Recommendations  Diet recommendations: Dysphagia 3 (mechanical soft);Thin liquid Liquids provided via: Cup;Straw Medication Administration: Whole meds with liquid Supervision: Patient able to self feed;Intermittent supervision to cue for compensatory strategies Compensations: Slow rate;Small sips/bites Postural Changes and/or Swallow Maneuvers: Seated upright 90 degrees;Upright 30-60 min after meal                Oral Care Recommendations: Oral care BID;Staff/trained caregiver to provide oral care Follow up Recommendations: 24 hour supervision/assistance SLP Visit Diagnosis: Dysphagia, unspecified (R13.10) Plan: Discharge SLP treatment due to (comment);All goals met       Thank you,  Genene Churn, [place]                 [place] 04/28/2018, 11:48 AM

## 2018-04-28 NOTE — Plan of Care (Signed)
  Problem: Safety: Goal: Ability to remain free from injury will improve Outcome: Progressing   

## 2018-04-28 NOTE — Clinical Social Work Note (Addendum)
Pt has been rejected by [facility] and [facility].  Waiting to hear back from [facility] about bed offer.  Pt unable to be admitted to SNF for rehab based on observation status.  Have spoken with daughter and alerted her of this fact, and prepared her for the fact that patient may be returning home tomorrow.  If this is the case, will refer to different [facility] agency as daughter is unhappy with previous provider.

## 2018-04-29 DIAGNOSIS — R5381 Other malaise: Secondary | ICD-10-CM | POA: Diagnosis not present

## 2018-04-29 DIAGNOSIS — J309 Allergic rhinitis, unspecified: Secondary | ICD-10-CM | POA: Diagnosis not present

## 2018-04-29 DIAGNOSIS — M1990 Unspecified osteoarthritis, unspecified site: Secondary | ICD-10-CM | POA: Diagnosis not present

## 2018-04-29 DIAGNOSIS — E875 Hyperkalemia: Secondary | ICD-10-CM | POA: Diagnosis not present

## 2018-04-29 DIAGNOSIS — J449 Chronic obstructive pulmonary disease, unspecified: Secondary | ICD-10-CM | POA: Diagnosis present

## 2018-04-29 DIAGNOSIS — R404 Transient alteration of awareness: Secondary | ICD-10-CM | POA: Diagnosis not present

## 2018-04-29 DIAGNOSIS — B9629 Other Escherichia coli [E. coli] as the cause of diseases classified elsewhere: Secondary | ICD-10-CM | POA: Diagnosis not present

## 2018-04-29 DIAGNOSIS — Z9013 Acquired absence of bilateral breasts and nipples: Secondary | ICD-10-CM | POA: Diagnosis not present

## 2018-04-29 DIAGNOSIS — Z03818 Encounter for observation for suspected exposure to other biological agents ruled out: Secondary | ICD-10-CM | POA: Diagnosis not present

## 2018-04-29 DIAGNOSIS — R278 Other lack of coordination: Secondary | ICD-10-CM | POA: Diagnosis not present

## 2018-04-29 DIAGNOSIS — M170 Bilateral primary osteoarthritis of knee: Secondary | ICD-10-CM | POA: Diagnosis present

## 2018-04-29 DIAGNOSIS — M6281 Muscle weakness (generalized): Secondary | ICD-10-CM | POA: Diagnosis not present

## 2018-04-29 DIAGNOSIS — I959 Hypotension, unspecified: Secondary | ICD-10-CM | POA: Diagnosis not present

## 2018-04-29 DIAGNOSIS — R319 Hematuria, unspecified: Secondary | ICD-10-CM | POA: Diagnosis not present

## 2018-04-29 DIAGNOSIS — R4702 Dysphasia: Secondary | ICD-10-CM | POA: Diagnosis not present

## 2018-04-29 DIAGNOSIS — R569 Unspecified convulsions: Secondary | ICD-10-CM | POA: Diagnosis not present

## 2018-04-29 DIAGNOSIS — Z87440 Personal history of urinary (tract) infections: Secondary | ICD-10-CM | POA: Diagnosis not present

## 2018-04-29 DIAGNOSIS — R001 Bradycardia, unspecified: Secondary | ICD-10-CM | POA: Diagnosis not present

## 2018-04-29 DIAGNOSIS — E785 Hyperlipidemia, unspecified: Secondary | ICD-10-CM | POA: Diagnosis not present

## 2018-04-29 DIAGNOSIS — K449 Diaphragmatic hernia without obstruction or gangrene: Secondary | ICD-10-CM | POA: Diagnosis present

## 2018-04-29 DIAGNOSIS — G301 Alzheimer's disease with late onset: Secondary | ICD-10-CM | POA: Diagnosis not present

## 2018-04-29 DIAGNOSIS — R0902 Hypoxemia: Secondary | ICD-10-CM | POA: Diagnosis not present

## 2018-04-29 DIAGNOSIS — K219 Gastro-esophageal reflux disease without esophagitis: Secondary | ICD-10-CM | POA: Diagnosis present

## 2018-04-29 DIAGNOSIS — R32 Unspecified urinary incontinence: Secondary | ICD-10-CM | POA: Diagnosis not present

## 2018-04-29 DIAGNOSIS — Z20828 Contact with and (suspected) exposure to other viral communicable diseases: Secondary | ICD-10-CM | POA: Diagnosis present

## 2018-04-29 DIAGNOSIS — M150 Primary generalized (osteo)arthritis: Secondary | ICD-10-CM

## 2018-04-29 DIAGNOSIS — I2510 Atherosclerotic heart disease of native coronary artery without angina pectoris: Secondary | ICD-10-CM | POA: Diagnosis present

## 2018-04-29 DIAGNOSIS — Z66 Do not resuscitate: Secondary | ICD-10-CM | POA: Diagnosis present

## 2018-04-29 DIAGNOSIS — G9340 Encephalopathy, unspecified: Secondary | ICD-10-CM | POA: Diagnosis not present

## 2018-04-29 DIAGNOSIS — Z1612 Extended spectrum beta lactamase (ESBL) resistance: Secondary | ICD-10-CM | POA: Diagnosis not present

## 2018-04-29 DIAGNOSIS — H6690 Otitis media, unspecified, unspecified ear: Secondary | ICD-10-CM | POA: Diagnosis not present

## 2018-04-29 DIAGNOSIS — R531 Weakness: Secondary | ICD-10-CM | POA: Diagnosis not present

## 2018-04-29 DIAGNOSIS — M4800 Spinal stenosis, site unspecified: Secondary | ICD-10-CM | POA: Diagnosis not present

## 2018-04-29 DIAGNOSIS — N19 Unspecified kidney failure: Secondary | ICD-10-CM | POA: Diagnosis not present

## 2018-04-29 DIAGNOSIS — R410 Disorientation, unspecified: Secondary | ICD-10-CM | POA: Diagnosis not present

## 2018-04-29 DIAGNOSIS — Z8673 Personal history of transient ischemic attack (TIA), and cerebral infarction without residual deficits: Secondary | ICD-10-CM | POA: Diagnosis not present

## 2018-04-29 DIAGNOSIS — N390 Urinary tract infection, site not specified: Secondary | ICD-10-CM | POA: Diagnosis present

## 2018-04-29 DIAGNOSIS — Z87891 Personal history of nicotine dependence: Secondary | ICD-10-CM | POA: Diagnosis not present

## 2018-04-29 DIAGNOSIS — Z9049 Acquired absence of other specified parts of digestive tract: Secondary | ICD-10-CM | POA: Diagnosis not present

## 2018-04-29 DIAGNOSIS — F05 Delirium due to known physiological condition: Secondary | ICD-10-CM | POA: Diagnosis present

## 2018-04-29 DIAGNOSIS — E162 Hypoglycemia, unspecified: Secondary | ICD-10-CM | POA: Diagnosis not present

## 2018-04-29 DIAGNOSIS — Z743 Need for continuous supervision: Secondary | ICD-10-CM | POA: Diagnosis not present

## 2018-04-29 DIAGNOSIS — R4182 Altered mental status, unspecified: Secondary | ICD-10-CM | POA: Diagnosis present

## 2018-04-29 DIAGNOSIS — G514 Facial myokymia: Secondary | ICD-10-CM | POA: Diagnosis not present

## 2018-04-29 DIAGNOSIS — R1311 Dysphagia, oral phase: Secondary | ICD-10-CM | POA: Diagnosis not present

## 2018-04-29 DIAGNOSIS — G9341 Metabolic encephalopathy: Secondary | ICD-10-CM | POA: Diagnosis present

## 2018-04-29 DIAGNOSIS — R627 Adult failure to thrive: Secondary | ICD-10-CM | POA: Diagnosis not present

## 2018-04-29 DIAGNOSIS — R41841 Cognitive communication deficit: Secondary | ICD-10-CM | POA: Diagnosis not present

## 2018-04-29 DIAGNOSIS — Z90710 Acquired absence of both cervix and uterus: Secondary | ICD-10-CM | POA: Diagnosis not present

## 2018-04-29 DIAGNOSIS — R630 Anorexia: Secondary | ICD-10-CM | POA: Diagnosis not present

## 2018-04-29 DIAGNOSIS — R4020 Unspecified coma: Secondary | ICD-10-CM | POA: Diagnosis not present

## 2018-04-29 DIAGNOSIS — I10 Essential (primary) hypertension: Secondary | ICD-10-CM | POA: Diagnosis not present

## 2018-04-29 DIAGNOSIS — Z9181 History of falling: Secondary | ICD-10-CM | POA: Diagnosis not present

## 2018-04-29 DIAGNOSIS — M810 Age-related osteoporosis without current pathological fracture: Secondary | ICD-10-CM | POA: Diagnosis present

## 2018-04-29 DIAGNOSIS — R1310 Dysphagia, unspecified: Secondary | ICD-10-CM | POA: Diagnosis present

## 2018-04-29 DIAGNOSIS — K5900 Constipation, unspecified: Secondary | ICD-10-CM | POA: Diagnosis not present

## 2018-04-29 DIAGNOSIS — Z96612 Presence of left artificial shoulder joint: Secondary | ICD-10-CM | POA: Diagnosis present

## 2018-04-29 DIAGNOSIS — H04129 Dry eye syndrome of unspecified lacrimal gland: Secondary | ICD-10-CM | POA: Diagnosis not present

## 2018-04-29 DIAGNOSIS — Z96611 Presence of right artificial shoulder joint: Secondary | ICD-10-CM | POA: Diagnosis present

## 2018-04-29 DIAGNOSIS — F0391 Unspecified dementia with behavioral disturbance: Secondary | ICD-10-CM | POA: Diagnosis present

## 2018-04-29 DIAGNOSIS — F0390 Unspecified dementia without behavioral disturbance: Secondary | ICD-10-CM | POA: Diagnosis not present

## 2018-04-29 DIAGNOSIS — B9620 Unspecified Escherichia coli [E. coli] as the cause of diseases classified elsewhere: Secondary | ICD-10-CM | POA: Diagnosis present

## 2018-04-29 DIAGNOSIS — F0280 Dementia in other diseases classified elsewhere without behavioral disturbance: Secondary | ICD-10-CM | POA: Diagnosis not present

## 2018-04-29 DIAGNOSIS — R1312 Dysphagia, oropharyngeal phase: Secondary | ICD-10-CM | POA: Diagnosis not present

## 2018-04-29 DIAGNOSIS — G8929 Other chronic pain: Secondary | ICD-10-CM | POA: Diagnosis not present

## 2018-04-29 DIAGNOSIS — M549 Dorsalgia, unspecified: Secondary | ICD-10-CM | POA: Diagnosis not present

## 2018-04-29 DIAGNOSIS — R4189 Other symptoms and signs involving cognitive functions and awareness: Secondary | ICD-10-CM | POA: Diagnosis not present

## 2018-04-29 DIAGNOSIS — E860 Dehydration: Secondary | ICD-10-CM | POA: Diagnosis not present

## 2018-04-29 LAB — BASIC METABOLIC PANEL
Anion gap: 6 (ref 5–15)
BUN: 19 mg/dL (ref 8–23)
CO2: 26 mmol/L (ref 22–32)
Calcium: 8.3 mg/dL — ABNORMAL LOW (ref 8.9–10.3)
Chloride: 109 mmol/L (ref 98–111)
Creatinine, Ser: 0.58 mg/dL (ref 0.44–1.00)
GFR calc Af Amer: >60 mL/min (ref >60)
GFR calc non Af Amer: >60 mL/min (ref >60)
Glucose, Bld: 94 mg/dL (ref 70–99)
Potassium: 3.6 mmol/L (ref 3.5–5.1)
Sodium: 141 mmol/L (ref 135–145)

## 2018-04-29 LAB — CULTURE, BLOOD (ROUTINE X 2)
Culture: NO GROWTH
Special Requests: ADEQUATE

## 2018-04-29 LAB — GLUCOSE, CAPILLARY: Glucose-Capillary: 74 mg/dL (ref 70–99)

## 2018-04-29 MED ORDER — ADULT MULTIVITAMIN LIQUID CH: 15.0000 mL | Freq: Every day | ORAL | Status: DC

## 2018-04-29 MED ORDER — DICLOFENAC SODIUM 1 % TD GEL: TRANSDERMAL | Status: AC

## 2018-04-29 MED ORDER — SENNOSIDES-DOCUSATE SODIUM 8.6-50 MG PO TABS: 2.0000 | ORAL_TABLET | Freq: Every day | ORAL | Status: DC | PRN

## 2018-04-29 MED ORDER — TRAMADOL HCL 50 MG PO TABS: 50.0000 mg | ORAL_TABLET | Freq: Two times a day (BID) | ORAL | 0 refills | Status: AC | PRN

## 2018-04-29 MED ORDER — ENSURE ENLIVE PO LIQD: 237.0000 mL | Freq: Two times a day (BID) | ORAL | Status: DC

## 2018-04-29 NOTE — Progress Notes (Signed)
Physical Therapy Treatment Patient Details Name: Jennifer [name] MRN: [id number] DOB: 02-Apr-1922 Today's Date: 04/29/2018    History of Present Illness Jennifer [name] is a 85 y.o. female with medical history significant for dementia, CAD, COPD, osteoarthritis with chronic back and bilateral knee pain, now presenting to the emergency department for evaluation of lethargy, increased confusion, and anorexia.  History is obtained from the patient's daughter by phone, discussion with ED personnel, and review of the EMR.  Patient has been sleeping more, eating less, and more confused for close to 2 weeks now.  She was previously ambulatory, but has not ambulated since 04/17/2018.  She has been eating very little, also drinking very little for the past week and daughter has had difficulty getting the patient to swallow her pills.  She has not been coughing, has not been febrile, and has not complained of anything other than her chronic back and bilateral knee pain.  No vomiting or diarrhea.  She was seen in the emergency department on 04/24/2018, there was some concern that she may have a UTI or pneumonia and she was treated with Levaquin.  Single set of blood cultures drawn on 04/24/2018 is growing coagulase-negative Staphylococcus.    PT Comments    Pt supine in bed and willing to participate.  Due to pt. difficulty hearing required increased time and frequent cueing for tasks to be complete. Mod A with bed mobility for supine to sit.  Pt with assistance to sit up tall and reduce Rt side lean with use of UE A required.  Seated LE strengthening exercises complete.  EOS pt left in bed with call bell within reach.  Use bed decline slope to assist [facility]     Follow Up Recommendations  SNF     Equipment Recommendations       Recommendations for Other Services       Precautions / Restrictions Precautions Precautions: Fall Restrictions Weight Bearing Restrictions: No    Mobility  Bed  Mobility Overal bed mobility: Needs Assistance       Supine to sit: Mod assist     General bed mobility comments: slow labored movement; cueing for handplacement to reach towards handrails to assist with supine to sit  Transfers                    Ambulation/Gait                 Stairs             Wheelchair Mobility    Modified Rankin (Stroke Patients Only)       Balance Overall balance assessment: Needs assistance Sitting-balance support: Feet supported;No upper extremity supported Sitting balance-Leahy Scale: Fair Sitting balance - Comments: cueing for handplacement to assist Postural control: Right lateral lean     Standing balance comment: unable to stand due to weakness                            Cognition Arousal/Alertness: Awake/alert Behavior During Therapy: WFL for tasks assessed/performed Overall Cognitive Status: Within Functional Limits for tasks assessed                                 General Comments: very HOH required repeated tactile/verbal cueing      Exercises General Exercises - Lower Extremity Ankle Circles/Pumps: Both;10 reps;Seated Long Arc Quad:  Both;10 reps;Seated Hip ABduction/ADduction: AAROM;Both;5 reps;Seated    General Comments        Pertinent Vitals/Pain Faces Pain Scale: Hurts whole lot Pain Location: low back and legs with movement Pain Descriptors / Indicators: Grimacing;Discomfort Pain Intervention(s): Monitored during session;Limited activity within patient's tolerance;Repositioned    Home Living                      Prior Function            PT Goals (current goals can now be found in the care plan section)      Frequency    Min 3X/week      PT Plan Current plan remains appropriate    Co-evaluation              AM-PAC PT "6 Clicks" Mobility   Outcome Measure  Help needed turning from your back to your side while in a flat bed without  using bedrails?: A Lot Help needed moving from lying on your back to sitting on the side of a flat bed without using bedrails?: A Lot Help needed moving to and from a bed to a chair (including a wheelchair)?: Total Help needed standing up from a chair using your arms (e.g., wheelchair or bedside chair)?: Total Help needed to walk in hospital room?: Total Help needed climbing 3-5 steps with a railing? : Total 6 Click Score: 8    End of Session Equipment Utilized During Treatment: Gait belt Activity Tolerance: Patient limited by fatigue;Patient tolerated treatment well Patient left: in bed;with call bell/phone within reach Nurse Communication: Mobility status PT Visit Diagnosis: Unsteadiness on feet (R26.81);Other abnormalities of gait and mobility (R26.89);Muscle weakness (generalized) (M62.81)     Time: 1157-2620 PT Time Calculation (min) (ACUTE ONLY): 25 min  Charges:  $Therapeutic Exercise: 8-22 mins $Therapeutic Activity: 8-22 mins                     [address] LPTA; CBIS [phone]  Juel Burrow 04/29/2018, 2:02 PM

## 2018-04-29 NOTE — TOC Transition Note (Signed)
Transition of Care [facility]) - CM/SW Discharge Note   Patient Details  Name: GIUSTINA MITTON MRN: [id number] Date of Birth: 03/21/1922  Transition of [facility]) CM/SW Contact:  Ida Rogue, LCSW Phone Number: 04/29/2018, 10:24 AM   Clinical Narrative:   Pt to transfer today.    Final next level of care: Skilled Nursing Facility Barriers to Discharge: No Barriers Identified   Patient Goals and CMS Choice Patient states their goals for this hospitalization and ongoing recovery are:: Pt's daughter expressed that her goal was for Pt to return home under her care CMS Medicare.gov Compare Post Acute Care list provided to:: Patient Represenative (must comment) Choice offered to / list presented to : Adult Children  Discharge Placement   Existing PASRR number confirmed : 04/27/18          Patient [facility] bed at: [facility] Patient to be transferred to facility by: [facility] Name of family member notified: Ms Waldo Laine Patient and family notified of of transfer: 04/29/18  Discharge Plan and Services In-house Referral: Clinical Social Work(Pt's daugter interested in in-home aid services )                        Social Determinants of Health (SDOH) Interventions     Readmission Risk Interventions No flowsheet data found.

## 2018-04-29 NOTE — Progress Notes (Signed)
Report was given at [facility] to Haze Rushing, RN, right upper arm midline catheter removed,this writer spoke with daughter [name] [phone]) informing her patient will be transferred today, await [facility] transport.

## 2018-04-29 NOTE — Plan of Care (Signed)

## 2018-04-29 NOTE — Care Management Important Message (Signed)
Important Message  Patient Details  Name: Jennifer [name] MRN: [id number] Date of Birth: 1922-07-03   Medicare Important Message Given:  Yes    Corey Harold 04/29/2018, 4:04 PM

## 2018-04-29 NOTE — Discharge Summary (Signed)
Physician Discharge Summary  DELAYNA SPARLIN ZOX:[id number] DOB: [birth year] DOA: 04/26/2018  PCP: [name], MD  Admit date: 04/26/2018 Discharge date: 04/29/2018  Time spent: 35 minutes  Recommendations for Outpatient Follow-up:  1. Repeat basic metabolic panel in 1 week to follow electrolytes and renal function   Discharge Diagnoses:  Acute encephalopathy COPD (chronic obstructive pulmonary disease) (HCC) Dementia without behavioral disturbance (HCC) Elevated blood pressure, situational Dehydration Physical deconditioning Bilateral knees osteoarthritis Hypokalemia Allergic rhinitis/dry eye syndrome   Discharge Condition: Stable and improved.  Patient discharged to skilled nursing facility for further care and rehabilitation.  Diet recommendation: Dysphagia 3 with thin liquids  Filed Weights   04/26/18 1253  Weight: 98.9 kg    History of present illness:  85 y.o.femalewith medical history significant fordementia, CAD, COPD, osteoarthritis with chronic back and bilateral knee pain, now presenting to the emergency department for evaluation of lethargy, increased confusion, and anorexia. History is obtained from the patient's daughter by phone, discussion with ED personnel, and review of the EMR. Patient has been sleeping more, eating less, and more confused for close to 2 weeks now. She was previously ambulatory, but has not ambulated since 04/17/2018. She has been eating very little, also drinking very little for the past week and daughter has had difficulty getting the patient to swallow her pills. She has not been coughing, has not been febrile, and has not complained of anything other than her chronic back and bilateral knee pain. No vomiting or diarrhea. She was seen in the emergency department on 04/24/2018, there was some concern that she may have a UTI or pneumonia and she was treated with Levaquin. Single set of blood cultures drawn on 04/24/2018 is growing  coagulase-negative Staphylococcus.  Hospital Course:  1-Acute encephalopathy and physical deconditioning: In the setting of further progression of her dementia and underlying dehydration. -Patient recently also started on Levaquin which could potentially be responsible for changes in mentation as well. -Given no signs of acute infection appreciated on x-ray or urinalysis/recent cultures.  Antibiotics were discontinued.   -Patient received fluid resuscitation and her diet was advanced as per speech therapy recommendations. -Physical therapy has found patient adequate for rehabilitation and conditioning at the skilled nursing facility; family in agreement to pursuit rehabilitation at [facility]  2-dysphasia -Following recommendation by speech therapy will continue using dysphagia 3 with thin liquids. -so far well tolerated.  3-COPD (chronic obstructive pulmonary disease) (HCC) -Currently no wheezing and with good oxygen saturation on room air  4-allergic rhinitis/dry eye syndrome -Continue home eyedrops and Flonase.  5-Dementia without behavioral disturbance (HCC) -Continue supportive care -Per physical therapy evaluation will benefit of skilled nursing facility rehabilitation at discharge.  6-positive blood culture -1 out of 2 coagulase-negative staph isolated -Contaminant in nature -Vancomycin initially started was discontinued; patient observed off antibiotics for 48 hours and there was No signs of acute infection.  7-dehydration with failure to thrive -Most likely associated with progression of dementia -Following dietitian recommendation will use Ensure twice a day and daily multivitamins. -Resolved by time of discharge after fluid resuscitation. -Patient encouraged to follow good oral intake. -As mentioned above; dysphagia 3 diet with thin liquids was well tolerated during hospitalization.  8-hypokalemia -Repleted and within normal limits at discharge -Will recommend repeat  basic metabolic panel in 5 days to follow electrolytes trend.    9-chronic bilateral knees osteoarthritis -Continue as needed pain medication (using tramadol, Tylenol and topical diclofenac). -Physical therapy and rehabilitation as per the skilled nursing facility protocol.  Procedures:  See below for x-ray reports  Consultations:  None  Discharge Exam: Vitals:   04/29/18 0507 04/29/18 0509  BP: (!) 140/101 (!) 144/97  Pulse: 91 75  Resp: 18   Temp: 98 F (36.7 C)   SpO2: 99%    General exam: Alert, awake, oriented x 2, pleasantly confused and in no acute distress.  Patient is afebrile, denies nausea, vomiting, chest pain, shortness of breath and abdominal pain.  She expressed having some pain in her knees, intermittently and bilaterally (which is chronic).  Patient has demonstrated ability to maintain good oral intake and hydration following dysphagia 3 diet with thin liquids. Respiratory system: Clear to auscultation. Respiratory effort normal. Cardiovascular system:RRR. No murmurs, rubs, gallops. Gastrointestinal system: Abdomen is nondistended, soft and nontender. No organomegaly or masses felt. Normal bowel sounds heard. Central nervous system: Alert and oriented. No focal neurological deficits. Extremities: No cyanosis, edema or clubbing. Skin: No rashes, lesions or ulcers Psychiatry: Stable mood.  No suicidal ideation or hallucinations.  Patient pleasantly confused at baseline due to underlying dementia.   Discharge Instructions   Discharge Instructions    Discharge instructions   Complete by:  As directed    Take medications as prescribed Maintain adequate hydration Dysphagia 3 diet with thin liquids Physical therapy rehabilitation as per the skilled nursing facility protocol Follow-up with PCP in 2 weeks after discharge from SNF     Allergies as of 04/29/2018      Reactions   Aricept [donepezil Hcl] Other (See Comments)   confusion   Iohexol     Desc: PT  ALLERGIC TO CONTRAST- SHE CAN'T BREATH   Iron    Penicillins    Has patient had a PCN reaction causing immediate rash, facial/tongue/throat swelling, SOB or lightheadedness with hypotension:  unknown Has patient had a PCN reaction causing severe rash involving mucus membranes or skin necrosis: unknown Has patient had a PCN reaction that required hospitalization unknown Has patient had a PCN reaction occurring within the last 10 years: unknown If all of the above answers are "NO", then may proceed with Cephalosporin use.      Medication List    STOP taking these medications   celecoxib 100 MG capsule Commonly known as:  CELEBREX   doxycycline 100 MG tablet Commonly known as:  VIBRA-TABS   levofloxacin 500 MG tablet Commonly known as:  LEVAQUIN   loperamide 2 MG capsule Commonly known as:  IMODIUM     TAKE these medications   acetaminophen 500 MG tablet Commonly known as:  TYLENOL Take 1,000 mg by mouth 2 (two) times daily. *May take an additional dose if needed for pain   diclofenac sodium 1 % Gel Commonly known as:  VOLTAREN Apply to both knees QID PRN   feeding supplement (ENSURE ENLIVE) Liqd Take 237 mLs by mouth 2 (two) times daily between meals.   fluticasone 50 MCG/ACT nasal spray Commonly known as:  FLONASE Place 1 spray into both nostrils daily for 30 days.   gabapentin 100 MG capsule Commonly known as:  NEURONTIN Take 100 mg by mouth daily. Take 3 capsules by mouth at bedtime   lidocaine 5 % Commonly known as:  LIDODERM Place 1 patch onto the skin daily. Remove & Discard patch within 12 hours or as directed by MD   multivitamin Liqd Take 15 mLs by mouth daily. Start taking on:  April 30, 2018   oxybutynin 5 MG 24 hr tablet Commonly known as:  DITROPAN-XL TAKE 1 TABLET BY  MOUTH EVERY NIGHT AT BEDTIME   senna-docusate 8.6-50 MG tablet Commonly known as:  Senokot-S Take 2 tablets by mouth daily as needed for mild constipation.   Systane 0.4-0.3 %  Soln Generic drug:  Polyethyl Glycol-Propyl Glycol Apply 1 drop to eye every 6 (six) hours as needed (dry eyes).   traMADol 50 MG tablet Commonly known as:  ULTRAM Take 1 tablet (50 mg total) by mouth every 12 (twelve) hours as needed for up to 7 days for moderate pain or severe pain (take 45-60 minutes prior to PT). What changed:    when to take this  reasons to take this      Allergies  Allergen Reactions  . Aricept [Donepezil Hcl] Other (See Comments)    confusion  . Iohexol      Desc: PT ALLERGIC TO CONTRAST- SHE CAN'T BREATH   . Iron   . Penicillins     Has patient had a PCN reaction causing immediate rash, facial/tongue/throat swelling, SOB or lightheadedness with hypotension:  unknown Has patient had a PCN reaction causing severe rash involving mucus membranes or skin necrosis: unknown Has patient had a PCN reaction that required hospitalization unknown Has patient had a PCN reaction occurring within the last 10 years: unknown If all of the above answers are "NO", then may proceed with Cephalosporin use.     Contact information for follow-up providers    [name], MD. Schedule an appointment as soon as possible for a visit in 2 week(s).   Specialty:  Family Medicine Why:  after discharge from SNF Contact information: [address] [place] [address] [phone]            Contact information for after-discharge care    Destination    [facility] .   Service:  Skilled Nursing Contact information: [address] [place] [address] [phone]                  The results of significant diagnostics from this hospitalization (including imaging, microbiology, ancillary and laboratory) are listed below for reference.    Significant Diagnostic Studies: Ct Head Wo Contrast  Result Date: 04/26/2018 CLINICAL DATA:  Encephalopathy. EXAM: CT HEAD WITHOUT CONTRAST  TECHNIQUE: Contiguous axial images were obtained from the base of the skull through the vertex without intravenous contrast. COMPARISON:  04/24/2018 FINDINGS: Brain: There is no evidence of acute infarct, intracranial hemorrhage, mass, midline shift, or extra-axial fluid collection. Disc proportionate mesial temporal lobe atrophy is again seen bilaterally. Patchy cerebral white matter hypodensities are unchanged and nonspecific but compatible with moderate chronic small vessel ischemic disease. Vascular: Calcified atherosclerosis at the skull base. No hyperdense vessel. Skull: No fracture or focal osseous lesion. Sinuses/Orbits: Visualized paranasal sinuses are clear. Persistent bilateral mastoid effusions and left cataract extraction are noted. Other: Resolution of venous gas at the skull base and in the upper neck on the prior CT. IMPRESSION: 1. No evidence of acute intracranial abnormality. 2. Moderate chronic small vessel ischemic disease. Electronically Signed   By: Sebastian Ache M.D.   On: 04/26/2018 14:36   Ct Head Wo Contrast  Result Date: 04/24/2018 CLINICAL DATA:  Altered mental status. EXAM: CT HEAD WITHOUT CONTRAST TECHNIQUE: Contiguous axial images were obtained from the base of the skull through the vertex without intravenous contrast. COMPARISON:  CT scan of January 26, 2018. FINDINGS: Brain: Mild diffuse cortical atrophy is noted. Mild chronic ischemic white matter disease is  noted. No mass effect or midline shift is noted. Ventricular size is within normal limits. There is no evidence of mass lesion, hemorrhage or acute infarction. Vascular: No hyperdense vessel or unexpected calcification. Skull: Normal. Negative for fracture or focal lesion. Sinuses/Orbits: No acute finding. Other: There is noted soft tissue gas in the right zygomatic region as well as in the region of the base of the skull. It is uncertain if this is due to extension of pneumomediastinum or possibly infection. IMPRESSION: Mild  diffuse cortical atrophy. Mild chronic ischemic white matter disease. No acute intracranial abnormality seen. Soft tissue gas is seen in the right zygomatic region as well as in the region of the base of the skull of unknown etiology. It is uncertain if this is possibly related to infection or extension of pneumomediastinum. Radiographs or CT scan of the soft tissues of the neck may be performed for further evaluation. Electronically Signed   By: Lupita Raider, M.D.   On: 04/24/2018 17:33   Ct Soft Tissue Neck Wo Contrast  Result Date: 04/24/2018 CLINICAL DATA:  Abnormal head CT with subcutaneous air EXAM: CT NECK WITHOUT CONTRAST TECHNIQUE: Multidetector CT imaging of the neck was performed following the standard protocol without intravenous contrast. COMPARISON:  Head CT from earlier today. Cervical spine CT 12/07/2014 FINDINGS: Pharynx and larynx: No evidence of mass or swelling Salivary glands: Negative Thyroid: Subcentimeter left-sided nodule, incidental Lymph nodes: None enlarged or abnormal density Vascular: Soft tissue gas on prior study is intravenous - seen in the anti dependent bilateral jugular veins, paravertebral plexus, parapharyngeal plexus, and cavernous sinuses. Limited intracranial: As seen previously. Visualized orbits: Left cataract resection Mastoids and visualized paranasal sinuses: Clear Skeleton: No acute finding. Upper chest: Subpleural opacities that are nonspecific and may be chronic. IMPRESSION: Soft tissue gas on prior head CT is intravenous and presumably related to IV technique. No evidence of inflammation or barotrauma. Electronically Signed   By: Marnee Spring M.D.   On: 04/24/2018 18:40   Dg Chest Portable 1 View  Result Date: 04/24/2018 CLINICAL DATA:  Altered mental status. EXAM: PORTABLE CHEST 1 VIEW COMPARISON:  Radiographs of January 24, 2018. FINDINGS: Stable cardiomegaly. No pneumothorax or pleural effusion is noted. Left lung is clear. Right upper lobe airspace  opacity is noted concerning for possible pneumonia. The visualized skeletal structures are unremarkable. IMPRESSION: Right upper lobe airspace opacity is noted concerning for possible pneumonia. Followup PA and lateral chest X-ray is recommended in 3-4 weeks following trial of antibiotic therapy to ensure resolution and exclude underlying malignancy. Electronically Signed   By: Lupita Raider, M.D.   On: 04/24/2018 15:36   Dg Abd Acute 2+v W 1v Chest  Result Date: 04/26/2018 CLINICAL DATA:  Pain, fatigue and positive blood cultures. EXAM: DG ABDOMEN ACUTE W/ 1V CHEST COMPARISON:  04/24/2018 and prior radiographs FINDINGS: The chest radiograph is rotated and leaning towards the RIGHT. No focal airspace disease, pleural effusion or pneumothorax. Mild RIGHT basilar atelectasis/scarring again noted. Bowel gas pattern is unremarkable. No dilated bowel loops or pneumoperitoneum. No suspicious calcifications are present. No acute bony abnormalities are present. Degenerative changes in the lumbar spine again noted. IMPRESSION: 1. No evidence of acute abnormality. Unremarkable bowel gas pattern. 2. No evidence of acute cardiopulmonary disease. Electronically Signed   By: Harmon Pier M.D.   On: 04/26/2018 14:35    Microbiology: Recent Results (from the past 240 hour(s))  Urine Culture     Status: None   Collection Time: 04/24/18  2:32 PM  Result Value Ref Range Status   Specimen Description   Final    URINE, CATHETERIZED Performed at [facility], [address] [place], [place] [address]    Special Requests   Final    NONE Performed at [facility], [address], [place] , [place] [address]    Culture   Final    Multiple bacterial morphotypes present, none predominant. Suggest appropriate recollection if clinically indicated.   Report Status 04/25/2018 FINAL  Final  Blood culture (routine x 2)     Status: Abnormal   Collection Time: 04/24/18  3:01 PM  Result Value Ref Range Status   Specimen  Description   Final    BLOOD RIGHT ARM Performed at [facility], [address], [place], [place] [address]    Special Requests   Final    BOTTLES DRAWN AEROBIC AND ANAEROBIC Blood Culture adequate volume Performed at [facility], [address] [place], [place] [address]    Culture  Setup Time   Final    GRAM POSITIVE COCCI ANAEROBIC BOTTLE Gram Stain Report Called to,Read Back By and Verified With: SIMMS M. AT 1035A ON [id number] BY THOMPSON S. CRITICAL RESULT CALLED TO, READ BACK BY AND VERIFIED WITH: A. ADKINS, RN (M[facility]P) AT 1710 ON 04/25/18 BY C. JESSUP, MLT.    Culture (A)  Final    STAPHYLOCOCCUS SPECIES (COAGULASE NEGATIVE) THE SIGNIFICANCE OF ISOLATING THIS ORGANISM FROM A SINGLE SET OF BLOOD CULTURES WHEN MULTIPLE SETS ARE DRAWN IS UNCERTAIN. PLEASE NOTIFY THE MICROBIOLOGY DEPARTMENT WITHIN ONE WEEK IF SPECIATION AND SENSITIVITIES ARE REQUIRED. Performed at [facility], [address] [place], [place] [address]    Report Status 04/26/2018 FINAL  Final  Blood culture (routine x 2)     Status: None   Collection Time: 04/24/18  3:01 PM  Result Value Ref Range Status   Specimen Description BLOOD LEFT ARM  Final   Special Requests   Final    BOTTLES DRAWN AEROBIC AND ANAEROBIC Blood Culture adequate volume   Culture   Final    NO GROWTH 5 DAYS Performed at [facility], [address], [place], [place] [address]    Report Status 04/29/2018 FINAL  Final  Blood Culture ID Panel (Reflexed)     Status: Abnormal   Collection Time: 04/24/18  3:01 PM  Result Value Ref Range Status   Enterococcus species NOT DETECTED NOT DETECTED Final   Listeria monocytogenes NOT DETECTED NOT DETECTED Final   Staphylococcus species DETECTED (A) NOT DETECTED Final    Comment: Methicillin (oxacillin) susceptible coagulase negative staphylococcus. Possible blood culture contaminant (unless isolated from more than one blood culture draw or clinical case suggests pathogenicity). No antibiotic  treatment is indicated for blood  culture contaminants. CRITICAL RESULT CALLED TO, READ BACK BY AND VERIFIED WITH: A. ADKINS, RN (M[facility]P) AT 1710 ON 04/25/18 BY C. JESSUP, MLT.    Staphylococcus aureus (BCID) NOT DETECTED NOT DETECTED Final   Methicillin resistance NOT DETECTED NOT DETECTED Final   Streptococcus species NOT DETECTED NOT DETECTED Final   Streptococcus agalactiae NOT DETECTED NOT DETECTED Final   Streptococcus pneumoniae NOT DETECTED NOT DETECTED Final   Streptococcus pyogenes NOT DETECTED NOT DETECTED Final   Acinetobacter baumannii NOT DETECTED NOT DETECTED Final   Enterobacteriaceae species NOT DETECTED NOT DETECTED Final   Enterobacter cloacae complex NOT DETECTED NOT DETECTED Final   Escherichia coli NOT DETECTED NOT DETECTED Final   Klebsiella oxytoca NOT DETECTED NOT DETECTED Final  Klebsiella pneumoniae NOT DETECTED NOT DETECTED Final   Proteus species NOT DETECTED NOT DETECTED Final   Serratia marcescens NOT DETECTED NOT DETECTED Final   Haemophilus influenzae NOT DETECTED NOT DETECTED Final   Neisseria meningitidis NOT DETECTED NOT DETECTED Final   Pseudomonas aeruginosa NOT DETECTED NOT DETECTED Final   Candida albicans NOT DETECTED NOT DETECTED Final   Candida glabrata NOT DETECTED NOT DETECTED Final   Candida krusei NOT DETECTED NOT DETECTED Final   Candida parapsilosis NOT DETECTED NOT DETECTED Final   Candida tropicalis NOT DETECTED NOT DETECTED Final    Comment: Performed at [facility], [address], [place], [place] [address]  Culture, blood (routine x 2)     Status: None (Preliminary result)   Collection Time: 04/26/18  2:40 PM  Result Value Ref Range Status   Specimen Description RIGHT ANTECUBITAL  Final   Special Requests   Final    BOTTLES DRAWN AEROBIC AND ANAEROBIC Blood Culture adequate volume   Culture   Final    NO GROWTH 3 DAYS Performed at [facility], [address], [place], [place] [address]    Report Status PENDING   Incomplete  Urine culture     Status: None   Collection Time: 04/26/18  2:50 PM  Result Value Ref Range Status   Specimen Description   Final    URINE, CATHETERIZED Performed at [facility], [address], [place], [place] [address]    Special Requests   Final    NONE Performed at [facility], [address], [place], [place] [address]    Culture   Final    NO GROWTH Performed at [facility], [address], [place], [place] [address]    Report Status 04/27/2018 FINAL  Final  Culture, blood (routine x 2)     Status: None (Preliminary result)   Collection Time: 04/26/18  3:04 PM  Result Value Ref Range Status   Specimen Description BLOOD RIGHT HAND  Final   Special Requests   Final    BOTTLES DRAWN AEROBIC ONLY Blood Culture adequate volume   Culture   Final    NO GROWTH 3 DAYS Performed at [facility], [address] [place], [place] [address]    Report Status PENDING  Incomplete     Labs: Basic Metabolic Panel: Recent Labs  Lab 04/24/18 1452 04/26/18 1440 04/27/18 1333 04/28/18 0532 04/29/18 0924  NA 149* 144 143 144 141  K 4.9 3.5 3.4* 3.1* 3.6  CL 111 110 109 109 109  CO2 25 22 24 25 26   GLUCOSE 101* 82 107* 99 94  BUN 25* 26* 22 24* 19  CREATININE 1.08* 0.78 0.67 0.65 0.58  CALCIUM 9.7 8.9 8.7* 8.5* 8.3*   Liver Function Tests: Recent Labs  Lab 04/24/18 1452 04/26/18 1440  AST 25 19  ALT 14 12  ALKPHOS 86 87  BILITOT 1.3* 0.9  PROT 7.6 7.2  ALBUMIN 4.0 3.7    Recent Labs  Lab 04/24/18 1500  AMMONIA 13   CBC: Recent Labs  Lab 04/24/18 1452 04/26/18 1440 04/27/18 1333  WBC 6.3 7.0 6.1  NEUTROABS 4.3 4.5 4.1  HGB 14.4 13.5 13.2  HCT 47.4* 43.9 42.6  MCV 98.1 98.2 97.7  PLT 176 164 135*   Cardiac Enzymes: Recent Labs  Lab 04/24/18 1452 04/26/18 1440  TROPONINI <0.03 <0.03   BNP: BNP (last 3 results) Recent Labs    01/24/18 1440  BNP 249.0*    CBG: Recent  Labs  Lab 04/28/18 0731 04/28/18 2002 04/29/18 0733   GLUCAP 66* 75 74   Signed:  Vassie Lollarlos Sophee Mckimmy MD.  [facility] 04/29/2018, 1:21 PM

## 2018-05-01 LAB — CULTURE, BLOOD (ROUTINE X 2)
Culture: NO GROWTH
Culture: NO GROWTH
Special Requests: ADEQUATE
Special Requests: ADEQUATE

## 2018-05-01 LAB — FOLATE RBC
Folate, Hemolysate: 510 ng/mL
Folate, RBC: UNDETERMINED ng/mL

## 2018-05-01 LAB — HEMATOLOGY COMMENTS:

## 2018-05-03 DIAGNOSIS — J449 Chronic obstructive pulmonary disease, unspecified: Secondary | ICD-10-CM | POA: Diagnosis not present

## 2018-05-03 DIAGNOSIS — F0390 Unspecified dementia without behavioral disturbance: Secondary | ICD-10-CM | POA: Diagnosis not present

## 2018-05-03 DIAGNOSIS — G9341 Metabolic encephalopathy: Secondary | ICD-10-CM | POA: Diagnosis not present

## 2018-05-03 DIAGNOSIS — R1310 Dysphagia, unspecified: Secondary | ICD-10-CM | POA: Diagnosis not present

## 2018-05-10 DIAGNOSIS — R1310 Dysphagia, unspecified: Secondary | ICD-10-CM | POA: Diagnosis not present

## 2018-05-10 DIAGNOSIS — G9341 Metabolic encephalopathy: Secondary | ICD-10-CM | POA: Diagnosis not present

## 2018-05-10 DIAGNOSIS — J309 Allergic rhinitis, unspecified: Secondary | ICD-10-CM | POA: Diagnosis not present

## 2018-05-10 DIAGNOSIS — J449 Chronic obstructive pulmonary disease, unspecified: Secondary | ICD-10-CM | POA: Diagnosis not present

## 2018-05-17 DIAGNOSIS — G9341 Metabolic encephalopathy: Secondary | ICD-10-CM | POA: Diagnosis not present

## 2018-05-17 DIAGNOSIS — F0390 Unspecified dementia without behavioral disturbance: Secondary | ICD-10-CM | POA: Diagnosis not present

## 2018-05-17 DIAGNOSIS — H04129 Dry eye syndrome of unspecified lacrimal gland: Secondary | ICD-10-CM | POA: Diagnosis not present

## 2018-05-17 DIAGNOSIS — R1310 Dysphagia, unspecified: Secondary | ICD-10-CM | POA: Diagnosis not present

## 2018-05-24 DIAGNOSIS — J309 Allergic rhinitis, unspecified: Secondary | ICD-10-CM | POA: Diagnosis not present

## 2018-05-24 DIAGNOSIS — H04129 Dry eye syndrome of unspecified lacrimal gland: Secondary | ICD-10-CM | POA: Diagnosis not present

## 2018-05-24 DIAGNOSIS — J449 Chronic obstructive pulmonary disease, unspecified: Secondary | ICD-10-CM | POA: Diagnosis not present

## 2018-05-24 DIAGNOSIS — G9341 Metabolic encephalopathy: Secondary | ICD-10-CM | POA: Diagnosis not present

## 2018-05-31 ENCOUNTER — Other Ambulatory Visit: Payer: Self-pay | Admitting: *Deleted

## 2018-05-31 DIAGNOSIS — G9341 Metabolic encephalopathy: Secondary | ICD-10-CM | POA: Diagnosis not present

## 2018-05-31 DIAGNOSIS — I10 Essential (primary) hypertension: Secondary | ICD-10-CM | POA: Diagnosis not present

## 2018-05-31 DIAGNOSIS — R1310 Dysphagia, unspecified: Secondary | ICD-10-CM | POA: Diagnosis not present

## 2018-05-31 DIAGNOSIS — J449 Chronic obstructive pulmonary disease, unspecified: Secondary | ICD-10-CM | POA: Diagnosis not present

## 2018-05-31 NOTE — Patient Outreach (Signed)
[facility]) Care Management  05/31/2018  Danne TAYJA HEDINGER 11-21-1922 [id number]   Member screened for potential [facility] Care Management program services as a benefit for her [employer] Medicare insurance.  Mrs. Martinka remains at [facility] for rehab therapy.   Noted in recent hospitalization notes that member's daughter [name] should be contacted regarding member's disposition plans.  Telephone call made to [facility] daughter [name] [phone] to discuss [facility] Care Management program services. Daughter did not want to speak with Clinical research associate at the time. Stated "my mom is still in the facility and I do not know when she is coming home." Daughter did not engage any further with conversation.   Writer provided [facility] web address and advised to click on care management then services provided so daughter can review [employer] Care Management program services. Advised daughter that she can call if she wants [facility] Care Management. Provided telephone number as well.  Will continue to follow for disposition plans and progression.  Will update [facility] RN.   Raiford Noble, MSN-Ed, RN,BSN [facility] Post Acute Care Coordinator [phone]

## 2018-06-02 DIAGNOSIS — R1310 Dysphagia, unspecified: Secondary | ICD-10-CM | POA: Diagnosis not present

## 2018-06-02 DIAGNOSIS — H04129 Dry eye syndrome of unspecified lacrimal gland: Secondary | ICD-10-CM | POA: Diagnosis not present

## 2018-06-02 DIAGNOSIS — G9341 Metabolic encephalopathy: Secondary | ICD-10-CM | POA: Diagnosis not present

## 2018-06-02 DIAGNOSIS — I10 Essential (primary) hypertension: Secondary | ICD-10-CM | POA: Diagnosis not present

## 2018-06-03 DIAGNOSIS — I10 Essential (primary) hypertension: Secondary | ICD-10-CM | POA: Diagnosis not present

## 2018-06-03 DIAGNOSIS — H6690 Otitis media, unspecified, unspecified ear: Secondary | ICD-10-CM | POA: Diagnosis not present

## 2018-06-03 DIAGNOSIS — G9341 Metabolic encephalopathy: Secondary | ICD-10-CM | POA: Diagnosis not present

## 2018-06-03 DIAGNOSIS — R1310 Dysphagia, unspecified: Secondary | ICD-10-CM | POA: Diagnosis not present

## 2018-06-28 DIAGNOSIS — J309 Allergic rhinitis, unspecified: Secondary | ICD-10-CM | POA: Diagnosis not present

## 2018-06-28 DIAGNOSIS — I10 Essential (primary) hypertension: Secondary | ICD-10-CM | POA: Diagnosis not present

## 2018-06-28 DIAGNOSIS — H04129 Dry eye syndrome of unspecified lacrimal gland: Secondary | ICD-10-CM | POA: Diagnosis not present

## 2018-06-28 DIAGNOSIS — J449 Chronic obstructive pulmonary disease, unspecified: Secondary | ICD-10-CM | POA: Diagnosis not present

## 2018-06-30 DIAGNOSIS — J309 Allergic rhinitis, unspecified: Secondary | ICD-10-CM | POA: Diagnosis not present

## 2018-06-30 DIAGNOSIS — H04129 Dry eye syndrome of unspecified lacrimal gland: Secondary | ICD-10-CM | POA: Diagnosis not present

## 2018-06-30 DIAGNOSIS — J449 Chronic obstructive pulmonary disease, unspecified: Secondary | ICD-10-CM | POA: Diagnosis not present

## 2018-06-30 DIAGNOSIS — I10 Essential (primary) hypertension: Secondary | ICD-10-CM | POA: Diagnosis not present

## 2018-07-01 ENCOUNTER — Telehealth: Payer: Self-pay | Admitting: *Deleted

## 2018-07-01 NOTE — Telephone Encounter (Signed)
[name] pts daughter wanted to let Dr. Moshe Cipro know that Jennifer [name] was now in [place] home

## 2018-07-01 NOTE — Telephone Encounter (Signed)
FYI

## 2018-07-04 ENCOUNTER — Ambulatory Visit: Payer: Medicare Other | Admitting: Family Medicine

## 2018-07-07 NOTE — Telephone Encounter (Signed)
I spoke with daughter and inquired about her Mom, complaints are the same , arthritis pain, also states she wants to come home, however daughter feels this is best for her, I tried to assure her that it is best for the care her Mom

## 2018-07-25 DIAGNOSIS — I10 Essential (primary) hypertension: Secondary | ICD-10-CM | POA: Diagnosis not present

## 2018-07-25 DIAGNOSIS — J309 Allergic rhinitis, unspecified: Secondary | ICD-10-CM | POA: Diagnosis not present

## 2018-07-25 DIAGNOSIS — H04129 Dry eye syndrome of unspecified lacrimal gland: Secondary | ICD-10-CM | POA: Diagnosis not present

## 2018-07-25 DIAGNOSIS — J449 Chronic obstructive pulmonary disease, unspecified: Secondary | ICD-10-CM | POA: Diagnosis not present

## 2018-07-28 DIAGNOSIS — R531 Weakness: Secondary | ICD-10-CM | POA: Diagnosis not present

## 2018-07-28 DIAGNOSIS — M1990 Unspecified osteoarthritis, unspecified site: Secondary | ICD-10-CM | POA: Diagnosis not present

## 2018-07-28 DIAGNOSIS — I10 Essential (primary) hypertension: Secondary | ICD-10-CM | POA: Diagnosis not present

## 2018-07-28 DIAGNOSIS — J449 Chronic obstructive pulmonary disease, unspecified: Secondary | ICD-10-CM | POA: Diagnosis not present

## 2018-08-08 DIAGNOSIS — I10 Essential (primary) hypertension: Secondary | ICD-10-CM | POA: Diagnosis not present

## 2018-08-08 DIAGNOSIS — J449 Chronic obstructive pulmonary disease, unspecified: Secondary | ICD-10-CM | POA: Diagnosis not present

## 2018-08-29 DIAGNOSIS — H04129 Dry eye syndrome of unspecified lacrimal gland: Secondary | ICD-10-CM | POA: Diagnosis not present

## 2018-08-29 DIAGNOSIS — I10 Essential (primary) hypertension: Secondary | ICD-10-CM | POA: Diagnosis not present

## 2018-08-29 DIAGNOSIS — J309 Allergic rhinitis, unspecified: Secondary | ICD-10-CM | POA: Diagnosis not present

## 2018-08-29 DIAGNOSIS — J449 Chronic obstructive pulmonary disease, unspecified: Secondary | ICD-10-CM | POA: Diagnosis not present

## 2018-09-01 DIAGNOSIS — I10 Essential (primary) hypertension: Secondary | ICD-10-CM | POA: Diagnosis not present

## 2018-09-01 DIAGNOSIS — J309 Allergic rhinitis, unspecified: Secondary | ICD-10-CM | POA: Diagnosis not present

## 2018-09-01 DIAGNOSIS — F0391 Unspecified dementia with behavioral disturbance: Secondary | ICD-10-CM | POA: Diagnosis not present

## 2018-09-01 DIAGNOSIS — J449 Chronic obstructive pulmonary disease, unspecified: Secondary | ICD-10-CM | POA: Diagnosis not present

## 2018-09-12 DIAGNOSIS — I10 Essential (primary) hypertension: Secondary | ICD-10-CM | POA: Diagnosis not present

## 2018-09-12 DIAGNOSIS — J449 Chronic obstructive pulmonary disease, unspecified: Secondary | ICD-10-CM | POA: Diagnosis not present

## 2018-09-13 DIAGNOSIS — G301 Alzheimer's disease with late onset: Secondary | ICD-10-CM | POA: Diagnosis not present

## 2018-09-13 DIAGNOSIS — R4702 Dysphasia: Secondary | ICD-10-CM | POA: Diagnosis not present

## 2018-09-13 DIAGNOSIS — J449 Chronic obstructive pulmonary disease, unspecified: Secondary | ICD-10-CM | POA: Diagnosis present

## 2018-09-13 DIAGNOSIS — Z1612 Extended spectrum beta lactamase (ESBL) resistance: Secondary | ICD-10-CM | POA: Diagnosis present

## 2018-09-13 DIAGNOSIS — Z66 Do not resuscitate: Secondary | ICD-10-CM | POA: Diagnosis present

## 2018-09-13 DIAGNOSIS — R627 Adult failure to thrive: Secondary | ICD-10-CM | POA: Diagnosis not present

## 2018-09-13 DIAGNOSIS — M549 Dorsalgia, unspecified: Secondary | ICD-10-CM | POA: Diagnosis not present

## 2018-09-13 DIAGNOSIS — I10 Essential (primary) hypertension: Secondary | ICD-10-CM | POA: Diagnosis not present

## 2018-09-13 DIAGNOSIS — K449 Diaphragmatic hernia without obstruction or gangrene: Secondary | ICD-10-CM | POA: Diagnosis present

## 2018-09-13 DIAGNOSIS — N390 Urinary tract infection, site not specified: Secondary | ICD-10-CM | POA: Diagnosis present

## 2018-09-13 DIAGNOSIS — Z03818 Encounter for observation for suspected exposure to other biological agents ruled out: Secondary | ICD-10-CM | POA: Diagnosis not present

## 2018-09-13 DIAGNOSIS — G9340 Encephalopathy, unspecified: Secondary | ICD-10-CM | POA: Diagnosis not present

## 2018-09-13 DIAGNOSIS — R0902 Hypoxemia: Secondary | ICD-10-CM | POA: Diagnosis not present

## 2018-09-13 DIAGNOSIS — R278 Other lack of coordination: Secondary | ICD-10-CM | POA: Diagnosis not present

## 2018-09-13 DIAGNOSIS — R319 Hematuria, unspecified: Secondary | ICD-10-CM | POA: Diagnosis not present

## 2018-09-13 DIAGNOSIS — Z9049 Acquired absence of other specified parts of digestive tract: Secondary | ICD-10-CM | POA: Diagnosis not present

## 2018-09-13 DIAGNOSIS — E785 Hyperlipidemia, unspecified: Secondary | ICD-10-CM | POA: Diagnosis not present

## 2018-09-13 DIAGNOSIS — Z87440 Personal history of urinary (tract) infections: Secondary | ICD-10-CM | POA: Diagnosis not present

## 2018-09-13 DIAGNOSIS — R41841 Cognitive communication deficit: Secondary | ICD-10-CM | POA: Diagnosis not present

## 2018-09-13 DIAGNOSIS — Z96611 Presence of right artificial shoulder joint: Secondary | ICD-10-CM | POA: Diagnosis present

## 2018-09-13 DIAGNOSIS — I2510 Atherosclerotic heart disease of native coronary artery without angina pectoris: Secondary | ICD-10-CM | POA: Diagnosis present

## 2018-09-13 DIAGNOSIS — E875 Hyperkalemia: Secondary | ICD-10-CM | POA: Diagnosis not present

## 2018-09-13 DIAGNOSIS — R001 Bradycardia, unspecified: Secondary | ICD-10-CM | POA: Diagnosis not present

## 2018-09-13 DIAGNOSIS — K5900 Constipation, unspecified: Secondary | ICD-10-CM | POA: Diagnosis not present

## 2018-09-13 DIAGNOSIS — M4800 Spinal stenosis, site unspecified: Secondary | ICD-10-CM | POA: Diagnosis not present

## 2018-09-13 DIAGNOSIS — R32 Unspecified urinary incontinence: Secondary | ICD-10-CM | POA: Diagnosis not present

## 2018-09-13 DIAGNOSIS — M170 Bilateral primary osteoarthritis of knee: Secondary | ICD-10-CM | POA: Diagnosis present

## 2018-09-13 DIAGNOSIS — R4189 Other symptoms and signs involving cognitive functions and awareness: Secondary | ICD-10-CM | POA: Diagnosis not present

## 2018-09-13 DIAGNOSIS — G9341 Metabolic encephalopathy: Secondary | ICD-10-CM | POA: Diagnosis present

## 2018-09-13 DIAGNOSIS — Z96612 Presence of left artificial shoulder joint: Secondary | ICD-10-CM | POA: Diagnosis present

## 2018-09-13 DIAGNOSIS — Z90710 Acquired absence of both cervix and uterus: Secondary | ICD-10-CM | POA: Diagnosis not present

## 2018-09-13 DIAGNOSIS — F0391 Unspecified dementia with behavioral disturbance: Secondary | ICD-10-CM | POA: Diagnosis present

## 2018-09-13 DIAGNOSIS — M6281 Muscle weakness (generalized): Secondary | ICD-10-CM | POA: Diagnosis not present

## 2018-09-13 DIAGNOSIS — R4182 Altered mental status, unspecified: Secondary | ICD-10-CM | POA: Diagnosis present

## 2018-09-13 DIAGNOSIS — Z9013 Acquired absence of bilateral breasts and nipples: Secondary | ICD-10-CM | POA: Diagnosis not present

## 2018-09-13 DIAGNOSIS — J309 Allergic rhinitis, unspecified: Secondary | ICD-10-CM | POA: Diagnosis not present

## 2018-09-13 DIAGNOSIS — E162 Hypoglycemia, unspecified: Secondary | ICD-10-CM | POA: Diagnosis not present

## 2018-09-13 DIAGNOSIS — K219 Gastro-esophageal reflux disease without esophagitis: Secondary | ICD-10-CM | POA: Diagnosis present

## 2018-09-13 DIAGNOSIS — B9620 Unspecified Escherichia coli [E. coli] as the cause of diseases classified elsewhere: Secondary | ICD-10-CM | POA: Diagnosis present

## 2018-09-13 DIAGNOSIS — R1311 Dysphagia, oral phase: Secondary | ICD-10-CM | POA: Diagnosis not present

## 2018-09-13 DIAGNOSIS — R410 Disorientation, unspecified: Secondary | ICD-10-CM | POA: Diagnosis not present

## 2018-09-13 DIAGNOSIS — Z20828 Contact with and (suspected) exposure to other viral communicable diseases: Secondary | ICD-10-CM | POA: Diagnosis present

## 2018-09-13 DIAGNOSIS — G8929 Other chronic pain: Secondary | ICD-10-CM | POA: Diagnosis not present

## 2018-09-13 DIAGNOSIS — R4020 Unspecified coma: Secondary | ICD-10-CM | POA: Diagnosis not present

## 2018-09-13 DIAGNOSIS — I959 Hypotension, unspecified: Secondary | ICD-10-CM | POA: Diagnosis not present

## 2018-09-13 DIAGNOSIS — N19 Unspecified kidney failure: Secondary | ICD-10-CM | POA: Diagnosis not present

## 2018-09-13 DIAGNOSIS — M810 Age-related osteoporosis without current pathological fracture: Secondary | ICD-10-CM | POA: Diagnosis present

## 2018-09-13 DIAGNOSIS — Z9181 History of falling: Secondary | ICD-10-CM | POA: Diagnosis not present

## 2018-09-13 DIAGNOSIS — R569 Unspecified convulsions: Secondary | ICD-10-CM | POA: Diagnosis not present

## 2018-09-13 DIAGNOSIS — F05 Delirium due to known physiological condition: Secondary | ICD-10-CM | POA: Diagnosis present

## 2018-09-13 DIAGNOSIS — G514 Facial myokymia: Secondary | ICD-10-CM | POA: Diagnosis not present

## 2018-09-13 DIAGNOSIS — F0390 Unspecified dementia without behavioral disturbance: Secondary | ICD-10-CM | POA: Diagnosis not present

## 2018-09-13 DIAGNOSIS — Z8673 Personal history of transient ischemic attack (TIA), and cerebral infarction without residual deficits: Secondary | ICD-10-CM | POA: Diagnosis not present

## 2018-09-13 DIAGNOSIS — R630 Anorexia: Secondary | ICD-10-CM | POA: Diagnosis not present

## 2018-09-13 DIAGNOSIS — Z87891 Personal history of nicotine dependence: Secondary | ICD-10-CM | POA: Diagnosis not present

## 2018-09-13 DIAGNOSIS — R1310 Dysphagia, unspecified: Secondary | ICD-10-CM | POA: Diagnosis present

## 2018-09-13 DIAGNOSIS — H04129 Dry eye syndrome of unspecified lacrimal gland: Secondary | ICD-10-CM | POA: Diagnosis not present

## 2018-09-22 DIAGNOSIS — F0391 Unspecified dementia with behavioral disturbance: Secondary | ICD-10-CM | POA: Diagnosis not present

## 2018-09-22 DIAGNOSIS — J309 Allergic rhinitis, unspecified: Secondary | ICD-10-CM | POA: Diagnosis not present

## 2018-09-22 DIAGNOSIS — I10 Essential (primary) hypertension: Secondary | ICD-10-CM | POA: Diagnosis not present

## 2018-09-22 DIAGNOSIS — J449 Chronic obstructive pulmonary disease, unspecified: Secondary | ICD-10-CM | POA: Diagnosis not present

## 2018-09-25 DIAGNOSIS — N19 Unspecified kidney failure: Secondary | ICD-10-CM | POA: Diagnosis not present

## 2018-09-28 ENCOUNTER — Emergency Department (HOSPITAL_COMMUNITY): Payer: Medicare Other

## 2018-09-28 ENCOUNTER — Inpatient Hospital Stay (HOSPITAL_COMMUNITY)
Admission: EM | Admit: 2018-09-28 | Discharge: 2018-09-30 | DRG: 689 | Disposition: A | Discharge status: Skilled Nursing Facility | Payer: Medicare Other | Source: Skilled Nursing Facility | Attending: Internal Medicine | Admitting: Internal Medicine

## 2018-09-28 ENCOUNTER — Other Ambulatory Visit: Payer: Self-pay

## 2018-09-28 DIAGNOSIS — R4182 Altered mental status, unspecified: Secondary | ICD-10-CM | POA: Diagnosis present

## 2018-09-28 DIAGNOSIS — Z8673 Personal history of transient ischemic attack (TIA), and cerebral infarction without residual deficits: Secondary | ICD-10-CM

## 2018-09-28 DIAGNOSIS — Z9049 Acquired absence of other specified parts of digestive tract: Secondary | ICD-10-CM

## 2018-09-28 DIAGNOSIS — J449 Chronic obstructive pulmonary disease, unspecified: Secondary | ICD-10-CM | POA: Diagnosis present

## 2018-09-28 DIAGNOSIS — R32 Unspecified urinary incontinence: Secondary | ICD-10-CM

## 2018-09-28 DIAGNOSIS — R4020 Unspecified coma: Secondary | ICD-10-CM | POA: Diagnosis not present

## 2018-09-28 DIAGNOSIS — R4189 Other symptoms and signs involving cognitive functions and awareness: Secondary | ICD-10-CM | POA: Diagnosis not present

## 2018-09-28 DIAGNOSIS — G9340 Encephalopathy, unspecified: Secondary | ICD-10-CM | POA: Diagnosis present

## 2018-09-28 DIAGNOSIS — Z90710 Acquired absence of both cervix and uterus: Secondary | ICD-10-CM

## 2018-09-28 DIAGNOSIS — M170 Bilateral primary osteoarthritis of knee: Secondary | ICD-10-CM | POA: Diagnosis not present

## 2018-09-28 DIAGNOSIS — Z791 Long term (current) use of non-steroidal anti-inflammatories (NSAID): Secondary | ICD-10-CM

## 2018-09-28 DIAGNOSIS — Z03818 Encounter for observation for suspected exposure to other biological agents ruled out: Secondary | ICD-10-CM | POA: Diagnosis not present

## 2018-09-28 DIAGNOSIS — K219 Gastro-esophageal reflux disease without esophagitis: Secondary | ICD-10-CM | POA: Diagnosis present

## 2018-09-28 DIAGNOSIS — Z20828 Contact with and (suspected) exposure to other viral communicable diseases: Secondary | ICD-10-CM | POA: Diagnosis present

## 2018-09-28 DIAGNOSIS — R569 Unspecified convulsions: Secondary | ICD-10-CM | POA: Diagnosis not present

## 2018-09-28 DIAGNOSIS — B9629 Other Escherichia coli [E. coli] as the cause of diseases classified elsewhere: Secondary | ICD-10-CM | POA: Diagnosis present

## 2018-09-28 DIAGNOSIS — M1990 Unspecified osteoarthritis, unspecified site: Secondary | ICD-10-CM | POA: Diagnosis present

## 2018-09-28 DIAGNOSIS — R410 Disorientation, unspecified: Secondary | ICD-10-CM | POA: Diagnosis not present

## 2018-09-28 DIAGNOSIS — G301 Alzheimer's disease with late onset: Secondary | ICD-10-CM

## 2018-09-28 DIAGNOSIS — Z87891 Personal history of nicotine dependence: Secondary | ICD-10-CM

## 2018-09-28 DIAGNOSIS — F0391 Unspecified dementia with behavioral disturbance: Secondary | ICD-10-CM | POA: Diagnosis not present

## 2018-09-28 DIAGNOSIS — B9620 Unspecified Escherichia coli [E. coli] as the cause of diseases classified elsewhere: Secondary | ICD-10-CM | POA: Diagnosis present

## 2018-09-28 DIAGNOSIS — Z66 Do not resuscitate: Secondary | ICD-10-CM | POA: Diagnosis not present

## 2018-09-28 DIAGNOSIS — Z1612 Extended spectrum beta lactamase (ESBL) resistance: Secondary | ICD-10-CM | POA: Diagnosis present

## 2018-09-28 DIAGNOSIS — Z880 Allergy status to penicillin: Secondary | ICD-10-CM

## 2018-09-28 DIAGNOSIS — G9341 Metabolic encephalopathy: Secondary | ICD-10-CM | POA: Diagnosis present

## 2018-09-28 DIAGNOSIS — J309 Allergic rhinitis, unspecified: Secondary | ICD-10-CM | POA: Diagnosis not present

## 2018-09-28 DIAGNOSIS — N390 Urinary tract infection, site not specified: Principal | ICD-10-CM | POA: Diagnosis present

## 2018-09-28 DIAGNOSIS — Z9013 Acquired absence of bilateral breasts and nipples: Secondary | ICD-10-CM

## 2018-09-28 DIAGNOSIS — M810 Age-related osteoporosis without current pathological fracture: Secondary | ICD-10-CM | POA: Diagnosis present

## 2018-09-28 DIAGNOSIS — Z79899 Other long term (current) drug therapy: Secondary | ICD-10-CM

## 2018-09-28 DIAGNOSIS — H04129 Dry eye syndrome of unspecified lacrimal gland: Secondary | ICD-10-CM | POA: Diagnosis not present

## 2018-09-28 DIAGNOSIS — Z91041 Radiographic dye allergy status: Secondary | ICD-10-CM

## 2018-09-28 DIAGNOSIS — Z9181 History of falling: Secondary | ICD-10-CM

## 2018-09-28 DIAGNOSIS — Z96612 Presence of left artificial shoulder joint: Secondary | ICD-10-CM | POA: Diagnosis present

## 2018-09-28 DIAGNOSIS — Z96611 Presence of right artificial shoulder joint: Secondary | ICD-10-CM | POA: Diagnosis present

## 2018-09-28 DIAGNOSIS — E162 Hypoglycemia, unspecified: Secondary | ICD-10-CM | POA: Diagnosis not present

## 2018-09-28 DIAGNOSIS — I2510 Atherosclerotic heart disease of native coronary artery without angina pectoris: Secondary | ICD-10-CM | POA: Diagnosis present

## 2018-09-28 DIAGNOSIS — G514 Facial myokymia: Secondary | ICD-10-CM | POA: Diagnosis not present

## 2018-09-28 DIAGNOSIS — F05 Delirium due to known physiological condition: Secondary | ICD-10-CM | POA: Diagnosis not present

## 2018-09-28 DIAGNOSIS — Z888 Allergy status to other drugs, medicaments and biological substances status: Secondary | ICD-10-CM

## 2018-09-28 DIAGNOSIS — Z87440 Personal history of urinary (tract) infections: Secondary | ICD-10-CM

## 2018-09-28 DIAGNOSIS — E875 Hyperkalemia: Secondary | ICD-10-CM | POA: Diagnosis not present

## 2018-09-28 DIAGNOSIS — Z8249 Family history of ischemic heart disease and other diseases of the circulatory system: Secondary | ICD-10-CM

## 2018-09-28 DIAGNOSIS — F0390 Unspecified dementia without behavioral disturbance: Secondary | ICD-10-CM | POA: Diagnosis present

## 2018-09-28 DIAGNOSIS — R1310 Dysphagia, unspecified: Secondary | ICD-10-CM | POA: Diagnosis present

## 2018-09-28 DIAGNOSIS — K449 Diaphragmatic hernia without obstruction or gangrene: Secondary | ICD-10-CM | POA: Diagnosis present

## 2018-09-28 LAB — URINALYSIS, ROUTINE W REFLEX MICROSCOPIC
Bilirubin Urine: NEGATIVE
Glucose, UA: NEGATIVE mg/dL
Ketones, ur: NEGATIVE mg/dL
Nitrite: POSITIVE — AB
Protein, ur: NEGATIVE mg/dL
Specific Gravity, Urine: 1.012 (ref 1.005–1.030)
pH: 6 (ref 5.0–8.0)

## 2018-09-28 LAB — COMPREHENSIVE METABOLIC PANEL
ALT: 9 U/L (ref 0–44)
AST: 21 U/L (ref 15–41)
Albumin: 2.7 g/dL — ABNORMAL LOW (ref 3.5–5.0)
Alkaline Phosphatase: 99 U/L (ref 38–126)
Anion gap: 11 (ref 5–15)
BUN: 14 mg/dL (ref 8–23)
CO2: 22 mmol/L (ref 22–32)
Calcium: 8.7 mg/dL — ABNORMAL LOW (ref 8.9–10.3)
Chloride: 104 mmol/L (ref 98–111)
Creatinine, Ser: 0.93 mg/dL (ref 0.44–1.00)
GFR calc Af Amer: >60 mL/min (ref >60)
GFR calc non Af Amer: 53 mL/min — ABNORMAL LOW (ref >60)
Glucose, Bld: 84 mg/dL (ref 70–99)
Potassium: 5 mmol/L (ref 3.5–5.1)
Sodium: 137 mmol/L (ref 135–145)
Total Bilirubin: 0.6 mg/dL (ref 0.3–1.2)
Total Protein: 6.4 g/dL — ABNORMAL LOW (ref 6.5–8.1)

## 2018-09-28 LAB — CBC WITH DIFFERENTIAL/PLATELET
Abs Immature Granulocytes: 0.02 10*3/uL (ref 0.00–0.07)
Basophils Absolute: 0 10*3/uL (ref 0.0–0.1)
Basophils Relative: 0 %
Eosinophils Absolute: 0.2 10*3/uL (ref 0.0–0.5)
Eosinophils Relative: 3 %
HCT: 39.2 % (ref 36.0–46.0)
Hemoglobin: 11.5 g/dL — ABNORMAL LOW (ref 12.0–15.0)
Immature Granulocytes: 0 %
Lymphocytes Relative: 31 %
Lymphs Abs: 1.6 10*3/uL (ref 0.7–4.0)
MCH: 26.5 pg (ref 26.0–34.0)
MCHC: 29.3 g/dL — ABNORMAL LOW (ref 30.0–36.0)
MCV: 90.3 fL (ref 80.0–100.0)
Monocytes Absolute: 0.6 10*3/uL (ref 0.1–1.0)
Monocytes Relative: 11 %
Neutro Abs: 3 10*3/uL (ref 1.7–7.7)
Neutrophils Relative %: 55 %
Platelets: 283 10*3/uL (ref 150–400)
RBC: 4.34 MIL/uL (ref 3.87–5.11)
RDW: 15.5 % (ref 11.5–15.5)
WBC: 5.4 10*3/uL (ref 4.0–10.5)
nRBC: 0 % (ref 0.0–0.2)

## 2018-09-28 LAB — CBC
HCT: 37.9 % (ref 36.0–46.0)
Hemoglobin: 11.1 g/dL — ABNORMAL LOW (ref 12.0–15.0)
MCH: 26.9 pg (ref 26.0–34.0)
MCHC: 29.3 g/dL — ABNORMAL LOW (ref 30.0–36.0)
MCV: 92 fL (ref 80.0–100.0)
Platelets: 229 10*3/uL (ref 150–400)
RBC: 4.12 MIL/uL (ref 3.87–5.11)
RDW: 15.6 % — ABNORMAL HIGH (ref 11.5–15.5)
WBC: 5.9 10*3/uL (ref 4.0–10.5)
nRBC: 0 % (ref 0.0–0.2)

## 2018-09-28 LAB — TSH: TSH: 1.471 u[IU]/mL (ref 0.350–4.500)

## 2018-09-28 LAB — CREATININE, SERUM
Creatinine, Ser: 0.82 mg/dL (ref 0.44–1.00)
GFR calc Af Amer: >60 mL/min (ref >60)
GFR calc non Af Amer: >60 mL/min (ref >60)

## 2018-09-28 LAB — TROPONIN I (HIGH SENSITIVITY)
Troponin I (High Sensitivity): 5 ng/L (ref <18)
Troponin I (High Sensitivity): 5 ng/L (ref <18)

## 2018-09-28 LAB — AMMONIA: Ammonia: 19 umol/L (ref 9–35)

## 2018-09-28 LAB — PHOSPHORUS: Phosphorus: 3.7 mg/dL (ref 2.5–4.6)

## 2018-09-28 LAB — MAGNESIUM: Magnesium: 1.8 mg/dL (ref 1.7–2.4)

## 2018-09-28 LAB — SARS CORONAVIRUS 2 (TAT 6-24 HRS): SARS Coronavirus 2: NEGATIVE

## 2018-09-28 MED ORDER — SODIUM CHLORIDE 0.9 % IV BOLUS
1000.0000 mL | Freq: Once | INTRAVENOUS | Status: AC
  Administered 2018-09-28: 1000 mL via INTRAVENOUS

## 2018-09-28 MED ORDER — PANTOPRAZOLE SODIUM 40 MG PO TBEC
40.0000 mg | DELAYED_RELEASE_TABLET | Freq: Every day | ORAL | Status: DC
  Administered 2018-09-29 – 2018-09-30 (×2): 40 mg via ORAL
  Filled 2018-09-28 (×2): qty 1

## 2018-09-28 MED ORDER — LORAZEPAM 0.5 MG PO TABS: 0.5000 mg | ORAL_TABLET | Freq: Once | ORAL | Status: DC

## 2018-09-28 MED ORDER — SODIUM CHLORIDE 0.9 % IV SOLN
1.0000 g | INTRAVENOUS | Status: DC
  Administered 2018-09-29: 18:00:00 1 g via INTRAVENOUS
  Filled 2018-09-28: qty 10

## 2018-09-28 MED ORDER — OXYBUTYNIN CHLORIDE ER 5 MG PO TB24
5.0000 mg | ORAL_TABLET | Freq: Every day | ORAL | Status: DC
  Administered 2018-09-30: 5 mg via ORAL
  Filled 2018-09-28 (×4): qty 1

## 2018-09-28 MED ORDER — ENOXAPARIN SODIUM 40 MG/0.4ML ~~LOC~~ SOLN
40.0000 mg | SUBCUTANEOUS | Status: DC
  Administered 2018-09-28 – 2018-09-29 (×2): 40 mg via SUBCUTANEOUS
  Filled 2018-09-28 (×3): qty 0.4

## 2018-09-28 MED ORDER — DEXTROSE 50 % IV SOLN
INTRAVENOUS | Status: AC
  Filled 2018-09-28: qty 50

## 2018-09-28 MED ORDER — SODIUM CHLORIDE 0.9 % IV SOLN
1.0000 g | Freq: Once | INTRAVENOUS | Status: AC
  Administered 2018-09-28: 17:00:00 1 g via INTRAVENOUS
  Filled 2018-09-28: qty 10

## 2018-09-28 MED ORDER — SODIUM CHLORIDE 0.9 % IV SOLN
INTRAVENOUS | Status: DC
  Administered 2018-09-29: 02:00:00 via INTRAVENOUS

## 2018-09-28 MED ORDER — SENNOSIDES-DOCUSATE SODIUM 8.6-50 MG PO TABS: 2.0000 | ORAL_TABLET | Freq: Every day | ORAL | Status: DC | PRN

## 2018-09-28 MED ORDER — ACETAMINOPHEN 325 MG PO TABS
650.0000 mg | ORAL_TABLET | Freq: Three times a day (TID) | ORAL | Status: DC
  Administered 2018-09-29 – 2018-09-30 (×3): 650 mg via ORAL
  Filled 2018-09-28 (×3): qty 2

## 2018-09-28 MED ORDER — ONDANSETRON HCL 4 MG PO TABS: 4.0000 mg | ORAL_TABLET | Freq: Four times a day (QID) | ORAL | Status: DC | PRN

## 2018-09-28 MED ORDER — DEXTROSE 50 % IV SOLN
12.5000 g | Freq: Once | INTRAVENOUS | Status: AC
  Administered 2018-09-28: 22:00:00 12.5 g via INTRAVENOUS

## 2018-09-28 MED ORDER — ONDANSETRON HCL 4 MG/2ML IJ SOLN: 4.0000 mg | Freq: Four times a day (QID) | INTRAMUSCULAR | Status: DC | PRN

## 2018-09-28 MED ORDER — GABAPENTIN 100 MG PO CAPS
100.0000 mg | ORAL_CAPSULE | Freq: Two times a day (BID) | ORAL | Status: DC
  Administered 2018-09-29 – 2018-09-30 (×3): 100 mg via ORAL
  Filled 2018-09-28 (×3): qty 1

## 2018-09-28 NOTE — ED Notes (Signed)
Jillene Bucks- daughter- [phone]

## 2018-09-28 NOTE — ED Notes (Signed)
CBG- 92 

## 2018-09-28 NOTE — H&P (Signed)
History and Physical    Jennifer [name] Rauf ZOX:[id number] DOB: Jul 19, 1922 DOA: 09/28/2018  PCP: Kerri PerchesSimpson, Margaret E, MD  Patient coming from: Home I have personally briefly reviewed patient's old medical records [facility]  Chief Complaint: Altered mental status  HPI: Jennifer [name] Cavendish is a 85 y.o. female with medical history significant of dementia, coronary artery disease, COPD, osteoarthritis, hyperlipidemia brought in by ambulance from her nursing facility to emergency department for altered mental status and slurred speech.  I spoke to patient's daughter [name] (cell phone #[phone]) who reports that patient has dementia, she spoke to her mom on Sunday and she was doing well, she was talking and asking questions however this morning staff noted that she is not like herself. Ms. Dedra SkeensGwen also reports history of multiple urinary tract infection-recently treated with Bactrim.  Patient is DNR/DNI.  Power of attorney is patient's daughter Ms. Dedra SkeensGwen.  We discussed the plan in details over the phone.  Ms. Dedra SkeensGwen was not in favor of MRI as she thinks that patient will not tolerate the MRI.  ED Course: Patient awake, oriented to place only, follows commands sometimes..  CT head: No acute findings.  Patient was evaluated by neurology who recommended encephalopathy work-up, EEG to assess for seizures.  review of Systems: As per HPI otherwise negative.    Past Medical History:  Diagnosis Date  . Anemia    iron deficiency post op.  Marland Kitchen. CAD (coronary artery disease) 2008   Cath 40% mid LAD, 35% RCA  . Cellulitis of finger    right   . Chest pain 2006  . Chronic back pain   . COPD (chronic obstructive pulmonary disease) (HCC)   . Gastroesophageal reflux disease   . Hiatal hernia   . Hyperlipidemia   . Memory deficit   . Nicotine addiction   . Osteoarthritis    status post left TKA; surgery on the right is anticipated in the near future   . Pulmonary nodules    stable since 2006  .  Shingles    right breast   . Skin infection   . Ulcer    gential  . Use of cane as ambulatory aid     Past Surgical History:  Procedure Laterality Date  . ABDOMINAL HYSTERECTOMY    . BREAST SURGERY N/A    bilateral  . CARDIAC CATHETERIZATION  2008   Mid LAD 40%, RCA 25%.   . CHOLECYSTECTOMY    . COMBINED HYSTERECTOMY ABDOMINAL W/ A&P REPAIR / OOPHORECTOMY  approx. 40 years ago   . EYE SURGERY    . MASTECTOMY  1998 & 2007    right -1998 / left 2007  . right knee replacement  09/2009   Dr. Romeo AppleHarrison  . TOTAL KNEE ARTHROPLASTY  4/08   left      reports that she quit smoking about 3 years ago. Her smoking use included cigarettes. She has a 17.50 pack-year smoking history. She has never used smokeless tobacco. She reports that she does not drink alcohol or use drugs.  Allergies  Allergen Reactions  . Aricept [Donepezil Hcl] Other (See Comments)    confusion  . Iohexol      Desc: PT ALLERGIC TO CONTRAST- SHE CAN'T BREATH   . Iron   . Penicillins     Has patient had a PCN reaction causing immediate rash, facial/tongue/throat swelling, SOB or lightheadedness with hypotension:  unknown Has patient had a PCN reaction causing severe rash involving mucus membranes or skin  necrosis: unknown Has patient had a PCN reaction that required hospitalization unknown Has patient had a PCN reaction occurring within the last 10 years: unknown If all of the above answers are "NO", then may proceed with Cephalosporin use.     Family History  Problem Relation Age of Onset  . Cancer Mother 60       breast   . Cancer Father 51       prostate  . Hypertension Father   . Hypertension Sister   . Heart disease Sister   . Heart disease Sister   . Heart disease Sister     Prior to Admission medications   Medication Sig Start Date End Date Taking? Authorizing Provider  acetaminophen (TYLENOL) 325 MG tablet Take 650 mg by mouth 3 (three) times daily.   Yes [provider]   acetaminophen (TYLENOL) 500 MG tablet Take 1,000 mg by mouth 2 (two) times daily. *May take an additional dose if needed for pain   Yes [provider]  diclofenac sodium (VOLTAREN) 1 % GEL Apply to both knees QID PRN Patient taking differently: Apply 4 g topically 4 (four) times daily. Apply to both knees 04/29/18  Yes Barton Dubois, MD  gabapentin (NEURONTIN) 100 MG capsule Take 100 mg by mouth 2 (two) times daily.    Yes [provider]  geriatric multivitamins-minerals (ELDERTONIC/GEVRABON) LIQD Take 15 mLs by mouth daily.   Yes [provider]  lidocaine (LIDODERM) 5 % Place 1 patch onto the skin daily. Remove & Discard patch within 12 hours or as directed by MD 03/02/18  Yes Fayrene Helper, MD  omeprazole (PRILOSEC) 40 MG capsule Take 40 mg by mouth daily.   Yes [provider]  oxybutynin (DITROPAN-XL) 5 MG 24 hr tablet TAKE 1 TABLET BY MOUTH EVERY NIGHT AT BEDTIME Patient taking differently: Take 5 mg by mouth at bedtime.  01/11/18  Yes Fayrene Helper, MD  Polyethyl Glycol-Propyl Glycol (SYSTANE) 0.4-0.3 % SOLN Apply 1 drop to eye every 6 (six) hours as needed (dry eyes).    Yes [provider]  senna-docusate (SENOKOT-S) 8.6-50 MG tablet Take 2 tablets by mouth daily as needed for mild constipation. 04/29/18 04/29/19 Yes Barton Dubois, MD  sulfamethoxazole-trimethoprim (BACTRIM DS) 800-160 MG tablet Take 1 tablet by mouth 2 (two) times daily.   Yes [provider]  traMADol (ULTRAM) 50 MG tablet Take 50 mg by mouth every 6 (six) hours as needed for moderate pain or severe pain.   Yes [provider]  traMADol (ULTRAM) 50 MG tablet Take 25 mg by mouth 2 (two) times daily.   Yes [provider]  zinc sulfate 220 (50 Zn) MG capsule Take 220 mg by mouth daily.   Yes [provider]  feeding supplement, ENSURE ENLIVE, (ENSURE ENLIVE) LIQD Take 237 mLs by mouth 2 (two) times daily between meals. Patient not  taking: Reported on 09/28/2018 04/29/18   Barton Dubois, MD  fluticasone [facility]) 50 MCG/ACT nasal spray Place 1 spray into both nostrils daily for 30 days. 01/29/18 04/26/18  Manuella Ghazi, Pratik D, DO  Multiple Vitamin (MULTIVITAMIN) LIQD Take 15 mLs by mouth daily. Patient not taking: Reported on 09/28/2018 04/30/18   Barton Dubois, MD    Physical Exam: Vitals:   09/28/18 1119 09/28/18 1551 09/28/18 1937 09/28/18 1940  BP: (!) 132/104 (!) 146/99 (!) 124/111   Pulse: 62 65 75   Resp: 17 14 16    Temp: 97.8 F (36.6 C)  TempSrc: Axillary     SpO2: 97% 94%  99%    Constitutional: NAD, calm, comfortable Vitals:   09/28/18 1119 09/28/18 1551 09/28/18 1937 09/28/18 1940  BP: (!) 132/104 (!) 146/99 (!) 124/111   Pulse: 62 65 75   Resp: 17 14 16    Temp: 97.8 F (36.6 C)     TempSrc: Axillary     SpO2: 97% 94%  99%   Constitutional: Awake, oriented to place only.  Not in acute distress.   Eyes: PERRL, lids and conjunctivae normal ENMT: Mucous membranes are moist. Posterior pharynx clear of any exudate or lesions.Normal dentition.  Neck: normal, supple, no masses, no thyromegaly Respiratory: clear to auscultation bilaterally, no wheezing, no crackles. Normal respiratory effort. No accessory muscle use.  Cardiovascular: Regular rate and rhythm, no murmurs / rubs / gallops. No extremity edema. 2+ pedal pulses. No carotid bruits.  Abdomen: no tenderness, no masses palpated. No hepatosplenomegaly. Bowel sounds positive.  Musculoskeletal: no clubbing / cyanosis. No joint deformity upper and lower extremities. Good ROM, no contractures. Normal muscle tone.  Skin: no rashes, lesions, ulcers. No induration Neurologic: No neck stiffness.  Withdraw to painful stimuli. Unable to check power due to patient's confusion.   Labs on Admission: I have personally reviewed following labs and imaging studies  CBC: Recent Labs  Lab 09/28/18 1345 09/28/18 1823  WBC 5.4 5.9  NEUTROABS 3.0  --   HGB 11.5*  11.1*  HCT 39.2 37.9  MCV 90.3 92.0  PLT 283 229   Basic Metabolic Panel: Recent Labs  Lab 09/28/18 1345 09/28/18 1823  NA 137  --   K 5.0  --   CL 104  --   CO2 22  --   GLUCOSE 84  --   BUN 14  --   CREATININE 0.93 0.82  CALCIUM 8.7*  --   MG  --  1.8  PHOS  --  3.7   GFR: CrCl cannot be calculated (Unknown ideal weight.). Liver Function Tests: Recent Labs  Lab 09/28/18 1345  AST 21  ALT 9  ALKPHOS 99  BILITOT 0.6  PROT 6.4*  ALBUMIN 2.7*   No results for input(s): LIPASE, AMYLASE in the last 168 hours. Recent Labs  Lab 09/28/18 1455  AMMONIA 19   Coagulation Profile: No results for input(s): INR, PROTIME in the last 168 hours. Cardiac Enzymes: No results for input(s): CKTOTAL, CKMB, CKMBINDEX, TROPONINI in the last 168 hours. BNP (last 3 results) No results for input(s): PROBNP in the last 8760 hours. HbA1C: No results for input(s): HGBA1C in the last 72 hours. CBG: No results for input(s): GLUCAP in the last 168 hours. Lipid Profile: No results for input(s): CHOL, HDL, LDLCALC, TRIG, CHOLHDL, LDLDIRECT in the last 72 hours. Thyroid Function Tests: Recent Labs    09/28/18 1823  TSH 1.471   Anemia Panel: No results for input(s): VITAMINB12, FOLATE, FERRITIN, TIBC, IRON, RETICCTPCT in the last 72 hours. Urine analysis:    Component Value Date/Time   COLORURINE YELLOW 09/28/2018 1322   APPEARANCEUR CLOUDY (A) 09/28/2018 1322   LABSPEC 1.012 09/28/2018 1322   PHURINE 6.0 09/28/2018 1322   GLUCOSEU NEGATIVE 09/28/2018 1322   GLUCOSEU NEG mg/dL 42/70/6237 6283   HGBUR SMALL (A) 09/28/2018 1322   HGBUR negative 05/02/2008 1547   BILIRUBINUR NEGATIVE 09/28/2018 1322   BILIRUBINUR small 12/05/2012 1324   KETONESUR NEGATIVE 09/28/2018 1322   PROTEINUR NEGATIVE 09/28/2018 1322   UROBILINOGEN 1.0 12/05/2012 1324   UROBILINOGEN 0.2 05/02/2008 1547  NITRITE POSITIVE (A) 09/28/2018 1322   LEUKOCYTESUR LARGE (A) 09/28/2018 1322    Radiological  Exams on Admission: Ct Head Wo Contrast  Result Date: 09/28/2018 CLINICAL DATA:  Unexplained altered level of consciousness. EXAM: CT HEAD WITHOUT CONTRAST TECHNIQUE: Contiguous axial images were obtained from the base of the skull through the vertex without intravenous contrast. COMPARISON:  04/26/2018 FINDINGS: Brain: Today's study suffers from some motion degradation. Allowing for that, no change is appreciated. Age related atrophy. Chronic small-vessel ischemic changes of the cerebral hemispheric white matter. No sign of acute infarction, mass lesion, hemorrhage, hydrocephalus or extra-axial collection. Vascular: There is atherosclerotic calcification of the major vessels at the base of the brain. Skull: Negative Sinuses/Orbits: Clear/normal Other: None IMPRESSION: No acute finding. Motion degraded exam. Brain atrophy with some temporal predominance. Extensive chronic small-vessel ischemic changes of the white matter. Electronically Signed   By: Paulina FusiMark  Shogry [name].D.   On: 09/28/2018 12:18   Dg Chest Port 1 View  Result Date: 09/28/2018 CLINICAL DATA:  Altered mental status EXAM: PORTABLE CHEST 1 VIEW COMPARISON:  04/24/2018 FINDINGS: Cardiac silhouette remains mildly enlarged. Aorta is calcified and tortuous. Low lung volumes. Chronic coarsened interstitial markings bilaterally. The previously seen right upper lobe opacity is no longer evident. No new focal airspace consolidation identified. No large pleural fluid collection. No pneumothorax. Colonic interposition under the right hemidiaphragm. Bones are demineralized. IMPRESSION: Expiratory chest radiograph with chronic bilateral lung changes. No definite superimposed opacity. Electronically Signed   By: Duanne GuessNicholas  Plundo [name].D.   On: 09/28/2018 11:15    EKG: Normal sinus rhythm, no ST-T wave changes noted.  Assessment/Plan Active Problems:   Coronary atherosclerosis   Osteoarthritis   COPD (chronic obstructive pulmonary disease) (HCC)   Dementia  without behavioral disturbance (HCC)   Altered mental state   Acute lower UTI   Altered mental status: -Likely secondary to underlying urinary tract infection versus acute encephalopathy. -She presented with slurred speech and AMS.  History of multiple urinary tract infection.  She is a resident of nursing home facility.  Afebrile, no neck stiffness. -Admit patient under observation. -CT head without contrast came back negative for acute findings. -Patient was evaluated by neurologist-appreciate help-recommended encephalopathy work-up and EEG -Patient will unlikely be able to tolerate MRI brain-discussed with her daughter as well. -Avoid sedation medication. -On neurochecks, seizure and fall precautions. -We will keep her n.p.o. until passes bedside swallow evaluation. -Consulted ST/OT/PT  UTI: -UA is positive for leukocytes and nitrites.  Received IV Rocephin in the ED.  Will continue same. -Urine culture is pending. -Continue IV fluids.  COPD: Stable -No wheezing or shortness of breath on exam.  Maintaining oxygen saturation on 90s in room air.  Dementia: -Continue supportive care -Consulted physical therapy.  We will keep her n.p.o. until passes bedside swallow evaluation.  Chronic bilateral knee osteoarthritis: -Tylenol as needed for pain.  DVT prophylaxis: Lovenox , teds and SCD  code Status: DNR/DNI  family Communication: None present at bedside.  Plan of care discussed with patient's daughter in details i and she verbalized understanding and agreed with it. Disposition Plan: To be determined Consults called: Neurology Dr. Lindie SprucePriyanka Yadav. admission status: Observation  Ollen Bowlinka R Layann Bluett MD [facility] Pager [phone]  If 7PM-7AM, please contact night-coverage [email or web address] Password [email or web address]  09/28/2018, 7:50 PM

## 2018-09-28 NOTE — ED Notes (Signed)
Patient pulled out IV.

## 2018-09-28 NOTE — ED Notes (Signed)
CBG 65 

## 2018-09-28 NOTE — Consult Note (Addendum)
Neurology Consultation Reason for Consult: ams Referring Physician: Dr Meridee ScoreMichael Butler  CC: ams  History is obtained from: chart review and ED provider  HPI: Jennifer BeetsVelma M [name] is a 85 y.o. female  F with past medical history of dementia, coronary artery disease, COPD, osteoarthritis who presented to the ED for altered mental status and slurred speech.  During evaluation, ED provider noted that patient had right eye, face intermittent twitching and neurology was consulted due to concern for seizures.  Review of notes, patient lives in a nursing facility and at baseline is able to recognize her daughter and speak of Jesus.  However, today nursing facility noted that patient's speech was more slurred than baseline and she appeared more confused.  Unclear when this started.  Of note, patient was diagnosed with a urinary tract infection on Monday. No known history of seizure disorder.   ROS: A 14 point ROS was unable to obtain due to altered mental status.   Past Medical History:  Diagnosis Date  . Anemia    iron deficiency post op.  Marland Kitchen. CAD (coronary artery disease) 2008   Cath 40% mid LAD, 35% RCA  . Cellulitis of finger    right   . Chest pain 2006  . Chronic back pain   . COPD (chronic obstructive pulmonary disease) (HCC)   . Gastroesophageal reflux disease   . Hiatal hernia   . Hyperlipidemia   . Memory deficit   . Nicotine addiction   . Osteoarthritis    status post left TKA; surgery on the right is anticipated in the near future   . Pulmonary nodules    stable since 2006  . Shingles    right breast   . Skin infection   . Ulcer    gential  . Use of cane as ambulatory aid     Family History  Problem Relation Age of Onset  . Cancer Mother [phone]       breast   . Cancer Father 3675       prostate  . Hypertension Father   . Hypertension Sister   . Heart disease Sister   . Heart disease Sister   . Heart disease Sister    Social History: Per review of chart, reports that she  quit smoking about 3 years ago. Her smoking use included cigarettes. She has a 17.50 pack-year smoking history. She has never used smokeless tobacco. She reports that she does not drink alcohol or use drugs.  Exam: Current vital signs: BP (!) 132/104   Pulse 62   Temp 97.8 F (36.6 C) (Axillary)   Resp 17   SpO2 97%  Vital signs in last 24 hours: Temp:  [97.8 F (36.6 C)] 97.8 F (36.6 C) (09/16 1119) Pulse Rate:  [62] 62 (09/16 1119) Resp:  [17] 17 (09/16 1119) BP: (132)/(104) 132/104 (09/16 1119) SpO2:  [97 %] 97 % (09/16 1119)   Physical Exam  Constitutional: Appears well-developed and well-nourished.  Psych: unable to evaluate due to ams Eyes: No scleral injection,BL lower eyelid ectropion  HENT: No OP obstrucion Head: Normocephalic.  Cardiovascular: Normal rate and regular rhythm.  Respiratory: Effort normal, non-labored breathing GI: Soft.  No distension. There is no tenderness.  Skin: warm, no visible ulcer  Neuro: Mental Status: Patient is awake, alert, oriented to person, place, intermittently follows simple 1 step commands after repetition Cranial Nerves: PERLA, tracks examiner in room, blinks to threat BL, no apparent facial asymmetry, tongue midline and normal movement, able to hear  examiner and answer questions/follow commands, rest CN unable to assess sec to ams  Motor: antigravity strength in all extremities, intermittent right face and lower eye twitching, doesn't appear rhythmic. No neck stiffness Sensory: withdraws to tactile stimuli in all extremities Deep Tendon Reflexes: 1+ and symmetric in the biceps and patellae. Plantars: unable to perform as patient confused and refused to participate Cerebellar: unable to perform as patient confused and refused to participate  I was not able to review labs in epic because the reports are still pending  I have reviewed the images obtained: CT head wo contrast 09/28/2018: No acute finding. Motion degraded exam. Brain  atrophy with some temporal predominance. Extensive chronic small-vessel ischemic changes of the white matter.  Impression: 85yo F nursing home resident with multiple commodities including dementia who presented with slurred speech and ams in setting of recently diagnosed UTI.   Acute encephalopathy, likely infectious - [name] is an elderly demented female who has intermittent left sided facial twitching. This is not rhythmic to suggest epileptic origin. However, dementia, old age and UTI can definitely lower seizure threshold.  - Her encephalopathy is likely secondary to UTI. Low suspicion for meningiis/encephalitis due to absence of fever, neck stiffness and overall history and clinical picture  Recommendations: - Agree with ED Dr Melina Copa to check CBC, UA, UDS, ammonia as part of encephalopathy workup.  - Will order routine EEG to assess for seizure. Avoid benzo for left face twitching at this point due to low suspicion for seizure and it can worsen mentation - CT head is negative for acute abnormality. She doesn't appear to have focal deficits on exam. Its unlikely she will be able to tolerate MRI brain. Therefore, will defer at this time.  - However, if mental status doesn't improve in 24 hours, can consider MRI Brain wo contrast - Maintain SBP<140 - Avoid sedating medications - seizure and fall precautions   Thank you for allowing [place] to participate in the care of this patient.  Please page neuro hospitalist for any further questions.  Addasyn Mcbreen Barbra Sarks

## 2018-09-28 NOTE — ED Provider Notes (Signed)
[facility] EMERGENCY DEPARTMENT Provider Note   CSN: [id number] Arrival date & time: 09/28/18  1048     History   Chief Complaint Chief Complaint  Patient presents with  . Altered Mental Status    HPI Jennifer [name] is a 85 y.o. female.  Level 5 caveat secondary to altered mental status.  85 year old female brought in by ambulance from her nursing facility for being altered.  Reportedly at baseline she will recognize her daughter and speaks of Jesus.  Today they noted her to be more mumbling.  Unclear when this started.  No reported recent falls.  Did have a UTI diagnosed on Monday.     The history is provided by the EMS personnel. The history is limited by the condition of the patient.  Altered Mental Status Presenting symptoms: behavior changes   Severity:  Moderate Most recent episode:  Today Timing:  Constant Progression:  Unchanged Chronicity:  New Context: dementia, nursing home resident and recent infection   Context: not head injury     Past Medical History:  Diagnosis Date  . Anemia    iron deficiency post op.  Marland Kitchen CAD (coronary artery disease) 2008   Cath 40% mid LAD, 35% RCA  . Cellulitis of finger    right   . Chest pain 2006  . Chronic back pain   . COPD (chronic obstructive pulmonary disease) ([place])   . Gastroesophageal reflux disease   . Hiatal hernia   . Hyperlipidemia   . Memory deficit   . Nicotine addiction   . Osteoarthritis    status post left TKA; surgery on the right is anticipated in the near future   . Pulmonary nodules    stable since 2006  . Shingles    right breast   . Skin infection   . Ulcer    gential  . Use of cane as ambulatory aid     Patient Active Problem List   Diagnosis Date Noted  . Acute encephalopathy 04/26/2018  . Elevated blood pressure, situational 04/26/2018  . Positive blood culture 04/26/2018  . Shingles (herpes zoster) polyneuropathy 03/11/2018  . Advanced care planning/counseling  discussion   . Palliative care by specialist   . Goals of care, counseling/discussion   . Altered mental status 01/24/2018  . Frequent falls 01/24/2018  . Burnout of caregiver 07/21/2016  . DNR no code (do not resuscitate) 04/25/2014  . Urinary incontinence 04/04/2013  . Osteoporosis, post-menopausal 12/06/2012  . At high risk for falls 07/30/2012  . Dementia without behavioral disturbance ([place]) 08/19/2011  . COPD (chronic obstructive pulmonary disease) ([place]) 06/25/2011  . Allergic rhinitis 12/23/2010  . CEREBROVASCULAR DISEASE 08/05/2009  . Spinal stenosis 06/09/2007  . Coronary atherosclerosis 01/25/2007  . HIATAL HERNIA WITH REFLUX 01/25/2007  . Osteoarthritis 01/25/2007    Past Surgical History:  Procedure Laterality Date  . ABDOMINAL HYSTERECTOMY    . BREAST SURGERY N/A    bilateral  . CARDIAC CATHETERIZATION  2008   Mid LAD 40%, RCA 25%.   . CHOLECYSTECTOMY    . COMBINED HYSTERECTOMY ABDOMINAL W/ A&P REPAIR / OOPHORECTOMY  approx. 40 years ago   . EYE SURGERY    . MASTECTOMY  1998 & 2007    right -1998 / left 2007  . right knee replacement  09/2009   Dr. Aline Brochure  . TOTAL KNEE ARTHROPLASTY  4/08   left      OB History    Gravida      Para  Term      Preterm      AB      Living  1     SAB      TAB      Ectopic      Multiple      Live Births               Home Medications    Prior to Admission medications   Medication Sig Start Date End Date Taking? Authorizing Provider  acetaminophen (TYLENOL) 500 MG tablet Take 1,000 mg by mouth 2 (two) times daily. *May take an additional dose if needed for pain    [provider]  diclofenac sodium (VOLTAREN) 1 % GEL Apply to both knees QID PRN 04/29/18   Vassie LollMadera, Carlos, MD  feeding supplement, ENSURE ENLIVE, (ENSURE ENLIVE) LIQD Take 237 mLs by mouth 2 (two) times daily between meals. 04/29/18   Vassie LollMadera, Carlos, MD  fluticasone (FLONASE) 50 MCG/ACT nasal spray Place 1 spray into both  nostrils daily for 30 days. 01/29/18 04/26/18  Sherryll BurgerShah, Pratik D, DO  gabapentin (NEURONTIN) 100 MG capsule Take 100 mg by mouth daily. Take 3 capsules by mouth at bedtime    [provider]  lidocaine (LIDODERM) 5 % Place 1 patch onto the skin daily. Remove & Discard patch within 12 hours or as directed by MD 03/02/18   Kerri PerchesSimpson, Margaret E, MD  Multiple Vitamin (MULTIVITAMIN) LIQD Take 15 mLs by mouth daily. 04/30/18   Vassie LollMadera, Carlos, MD  oxybutynin (DITROPAN-XL) 5 MG 24 hr tablet TAKE 1 TABLET BY MOUTH EVERY NIGHT AT BEDTIME Patient taking differently: Take 5 mg by mouth at bedtime.  01/11/18   Kerri PerchesSimpson, Margaret E, MD  Polyethyl Glycol-Propyl Glycol (SYSTANE) 0.4-0.3 % SOLN Apply 1 drop to eye every 6 (six) hours as needed (dry eyes).     [provider]  senna-docusate (SENOKOT-S) 8.6-50 MG tablet Take 2 tablets by mouth daily as needed for mild constipation. 04/29/18 04/29/19  Vassie LollMadera, Carlos, MD    Family History Family History  Problem Relation Age of Onset  . Cancer Mother 3162       breast   . Cancer Father 4475       prostate  . Hypertension Father   . Hypertension Sister   . Heart disease Sister   . Heart disease Sister   . Heart disease Sister     Social History Social History   Tobacco Use  . Smoking status: Former Smoker    Packs/day: 0.25    Years: 70.00    Pack years: 17.50    Types: Cigarettes    Quit date: 03/13/2015    Years since quitting: 3.5  . Smokeless tobacco: Never Used  Substance Use Topics  . Alcohol use: No    Alcohol/week: 0.0 standard drinks  . Drug use: No     Allergies   Aricept [donepezil hcl], Iohexol, Iron, and Penicillins   Review of Systems Review of Systems  Unable to perform ROS: Mental status change     Physical Exam Updated Vital Signs BP (!) 146/99   Pulse 65   Temp 97.8 F (36.6 C) (Axillary)   Resp 14   SpO2 94%   Physical Exam Vitals signs and nursing note reviewed.  Constitutional:      General: She is  not in acute distress.    Appearance: She is well-developed.  HENT:     Head: Normocephalic and atraumatic.  Eyes:     Comments: Both her  eyes are closed and she is got some erythema along her lid margins.  She will fight when trying to open her eyes and do appear to be reactive.  Neck:     Musculoskeletal: Neck supple.  Cardiovascular:     Rate and Rhythm: Normal rate and regular rhythm.     Heart sounds: No murmur.  Pulmonary:     Effort: Pulmonary effort is normal. No respiratory distress.     Breath sounds: Normal breath sounds.  Abdominal:     Palpations: Abdomen is soft.     Tenderness: There is no abdominal tenderness. There is no guarding or rebound.  Musculoskeletal:        General: No deformity or signs of injury.  Skin:    General: Skin is warm and dry.     Capillary Refill: Capillary refill takes less than 2 seconds.  Neurological:     Comments: Patient is awake and mumbling, sometimes is understandable.  Associated with a lot of facial twitching.  She is got some rigidity of all her extremities but would withdraw upper and lower extremities.  Not following commands.      ED Treatments / Results  Labs (all labs ordered are listed, but only abnormal results are displayed) Labs Reviewed  CBC WITH DIFFERENTIAL/PLATELET - Abnormal; Notable for the following components:      Result Value   Hemoglobin 11.5 (*)    MCHC 29.3 (*)    All other components within normal limits  COMPREHENSIVE METABOLIC PANEL - Abnormal; Notable for the following components:   Calcium 8.7 (*)    Total Protein 6.4 (*)    Albumin 2.7 (*)    GFR calc non Af Amer 53 (*)    All other components within normal limits  URINALYSIS, ROUTINE W REFLEX MICROSCOPIC - Abnormal; Notable for the following components:   APPearance CLOUDY (*)    Hgb urine dipstick SMALL (*)    Nitrite POSITIVE (*)    Leukocytes,Ua LARGE (*)    Bacteria, UA MANY (*)    All other components within normal limits  SARS  CORONAVIRUS 2 (TAT 6-24 HRS)  URINE CULTURE  AMMONIA  CBC  CREATININE, SERUM  MAGNESIUM  PHOSPHORUS  TSH  URINE DRUGS OF ABUSE SCREEN W ALC, ROUTINE (REF LAB)  BASIC METABOLIC PANEL  CBC  CBG MONITORING, ED  TROPONIN I (HIGH SENSITIVITY)  TROPONIN I (HIGH SENSITIVITY)    EKG EKG Interpretation  Date/Time:  Wednesday September 28 2018 11:21:45 EDT Ventricular Rate:  62 PR Interval:    QRS Duration: 86 QT Interval:  423 QTC Calculation: 430 R Axis:   28 Text Interpretation:  Sinus rhythm Abnormal T, consider ischemia, lateral leads Minimal ST elevation, anterior leads similar to prior 4/20 Confirmed by Meridee Score [phone]) on 09/28/2018 11:29:40 AM   Radiology Ct Head Wo Contrast  Result Date: 09/28/2018 CLINICAL DATA:  Unexplained altered level of consciousness. EXAM: CT HEAD WITHOUT CONTRAST TECHNIQUE: Contiguous axial images were obtained from the base of the skull through the vertex without intravenous contrast. COMPARISON:  04/26/2018 FINDINGS: Brain: Today's study suffers from some motion degradation. Allowing for that, no change is appreciated. Age related atrophy. Chronic small-vessel ischemic changes of the cerebral hemispheric white matter. No sign of acute infarction, mass lesion, hemorrhage, hydrocephalus or extra-axial collection. Vascular: There is atherosclerotic calcification of the major vessels at the base of the brain. Skull: Negative Sinuses/Orbits: Clear/normal Other: None IMPRESSION: No acute finding. Motion degraded exam. Brain atrophy with some temporal predominance.  Extensive chronic small-vessel ischemic changes of the white matter. Electronically Signed   By: Paulina FusiMark  Shogry M.D.   On: 09/28/2018 12:18   Dg Chest Port 1 View  Result Date: 09/28/2018 CLINICAL DATA:  Altered mental status EXAM: PORTABLE CHEST 1 VIEW COMPARISON:  04/24/2018 FINDINGS: Cardiac silhouette remains mildly enlarged. Aorta is calcified and tortuous. Low lung volumes. Chronic  coarsened interstitial markings bilaterally. The previously seen right upper lobe opacity is no longer evident. No new focal airspace consolidation identified. No large pleural fluid collection. No pneumothorax. Colonic interposition under the right hemidiaphragm. Bones are demineralized. IMPRESSION: Expiratory chest radiograph with chronic bilateral lung changes. No definite superimposed opacity. Electronically Signed   By: Duanne GuessNicholas  Plundo M.D.   On: 09/28/2018 11:15    Procedures Procedures (including critical care time)  Medications Ordered in ED Medications  sodium chloride 0.9 % bolus 1,000 mL (has no administration in time range)     Initial Impression / Assessment and Plan / ED Course  I have reviewed the triage vital signs and the nursing notes.  Pertinent labs & imaging results that were available during my care of the patient were reviewed by me and considered in my medical decision making (see chart for details).  Clinical Course as of Sep 27 1720  Wed Sep 28, 2018  [id number] 85 year old nursing home resident here with new change in mental status being less conversant than baseline.  Patient is getting labs EKG urinalysis chest x-ray and head CT.   [MB]  1248 Patient's daughter is here now.  Sounds like she had possibly UTI last week and has been on antibiotics for that.  She said usually she is conversant and sometimes recognizes family and talks a lot about God and past relatives.  This is new with her more garbled speech and her facial twitching.   [MB]  1300 Patient much more alert and animated since arrival.  She has some speech that is understandable but still remains with some garbled speech and some facial twitching.  I put in a consult to neurology further recommendations.   [MB]  [id number] Neurology stop by and evaluated the patient.  They felt that the twitching and speech were less likely to be any kind of seizure activity although she did say that she would order an EEG.  She  felt it would be likely more related to a possible UTI or her elevated blood pressures.  Lab work still pending although urinalysis shows 21-50 whites with nitrite positive and many bacteria.  Ordered ceftriaxone and a urine culture.   [MB]  1536 Patient's lab work fairly unremarkable.  Patient has no pulled out the IV that the IV team placed.  Will need another IV and to get her antibiotics.   [MB]  1551 Discussed with [facility] hospitalist Dr. Jacqulyn BathPahwani who will evaluate the patient for admission.   [MB]    Clinical Course User Index [MB] Terrilee FilesButler, Michael C, MD   Jennifer BeetsVelma M [name] was evaluated in Emergency Department on 09/28/2018 for the symptoms described in the history of present illness. She was evaluated in the context of the global COVID-19 pandemic, which necessitated consideration that the patient might be at risk for infection with the SARS-CoV-2 virus that causes COVID-19. Institutional protocols and algorithms that pertain to the evaluation of patients at risk for COVID-19 are in a state of rapid change based on information released by regulatory bodies including the CDC and federal and state organizations. These policies and algorithms were followed during  the patient's care in the ED.      Final Clinical Impressions(s) / ED Diagnoses   Final diagnoses:  Encephalopathy acute  Lower urinary tract infectious disease    ED Discharge Orders    None       Terrilee Files, MD 09/28/18 1723

## 2018-09-28 NOTE — Discharge Planning (Signed)
EDCM to follow for disposition needs.  

## 2018-09-28 NOTE — ED Triage Notes (Signed)
Altered mental status. Baseline is usually talkative and oriented. Recently treated for UTI.

## 2018-09-29 ENCOUNTER — Telehealth: Payer: Self-pay | Admitting: Family Medicine

## 2018-09-29 ENCOUNTER — Observation Stay (HOSPITAL_COMMUNITY): Payer: Medicare Other

## 2018-09-29 DIAGNOSIS — R630 Anorexia: Secondary | ICD-10-CM | POA: Diagnosis not present

## 2018-09-29 DIAGNOSIS — R1311 Dysphagia, oral phase: Secondary | ICD-10-CM | POA: Diagnosis not present

## 2018-09-29 DIAGNOSIS — B351 Tinea unguium: Secondary | ICD-10-CM | POA: Diagnosis not present

## 2018-09-29 DIAGNOSIS — G8929 Other chronic pain: Secondary | ICD-10-CM | POA: Diagnosis not present

## 2018-09-29 DIAGNOSIS — M549 Dorsalgia, unspecified: Secondary | ICD-10-CM | POA: Diagnosis not present

## 2018-09-29 DIAGNOSIS — Z87440 Personal history of urinary (tract) infections: Secondary | ICD-10-CM | POA: Diagnosis not present

## 2018-09-29 DIAGNOSIS — H04129 Dry eye syndrome of unspecified lacrimal gland: Secondary | ICD-10-CM | POA: Diagnosis not present

## 2018-09-29 DIAGNOSIS — R627 Adult failure to thrive: Secondary | ICD-10-CM | POA: Diagnosis not present

## 2018-09-29 DIAGNOSIS — Z20828 Contact with and (suspected) exposure to other viral communicable diseases: Secondary | ICD-10-CM | POA: Diagnosis present

## 2018-09-29 DIAGNOSIS — Z1612 Extended spectrum beta lactamase (ESBL) resistance: Secondary | ICD-10-CM | POA: Diagnosis present

## 2018-09-29 DIAGNOSIS — M2042 Other hammer toe(s) (acquired), left foot: Secondary | ICD-10-CM | POA: Diagnosis not present

## 2018-09-29 DIAGNOSIS — R32 Unspecified urinary incontinence: Secondary | ICD-10-CM | POA: Diagnosis not present

## 2018-09-29 DIAGNOSIS — I2510 Atherosclerotic heart disease of native coronary artery without angina pectoris: Secondary | ICD-10-CM | POA: Diagnosis present

## 2018-09-29 DIAGNOSIS — Z7401 Bed confinement status: Secondary | ICD-10-CM | POA: Diagnosis not present

## 2018-09-29 DIAGNOSIS — J309 Allergic rhinitis, unspecified: Secondary | ICD-10-CM | POA: Diagnosis not present

## 2018-09-29 DIAGNOSIS — Z90710 Acquired absence of both cervix and uterus: Secondary | ICD-10-CM | POA: Diagnosis not present

## 2018-09-29 DIAGNOSIS — R4182 Altered mental status, unspecified: Secondary | ICD-10-CM | POA: Diagnosis not present

## 2018-09-29 DIAGNOSIS — M810 Age-related osteoporosis without current pathological fracture: Secondary | ICD-10-CM | POA: Diagnosis present

## 2018-09-29 DIAGNOSIS — B9620 Unspecified Escherichia coli [E. coli] as the cause of diseases classified elsewhere: Secondary | ICD-10-CM | POA: Diagnosis present

## 2018-09-29 DIAGNOSIS — R278 Other lack of coordination: Secondary | ICD-10-CM | POA: Diagnosis not present

## 2018-09-29 DIAGNOSIS — Z96612 Presence of left artificial shoulder joint: Secondary | ICD-10-CM | POA: Diagnosis present

## 2018-09-29 DIAGNOSIS — Z96611 Presence of right artificial shoulder joint: Secondary | ICD-10-CM | POA: Diagnosis present

## 2018-09-29 DIAGNOSIS — E875 Hyperkalemia: Secondary | ICD-10-CM | POA: Diagnosis not present

## 2018-09-29 DIAGNOSIS — Z87891 Personal history of nicotine dependence: Secondary | ICD-10-CM | POA: Diagnosis not present

## 2018-09-29 DIAGNOSIS — K449 Diaphragmatic hernia without obstruction or gangrene: Secondary | ICD-10-CM | POA: Diagnosis present

## 2018-09-29 DIAGNOSIS — L853 Xerosis cutis: Secondary | ICD-10-CM | POA: Diagnosis not present

## 2018-09-29 DIAGNOSIS — E162 Hypoglycemia, unspecified: Secondary | ICD-10-CM | POA: Diagnosis not present

## 2018-09-29 DIAGNOSIS — J449 Chronic obstructive pulmonary disease, unspecified: Secondary | ICD-10-CM | POA: Diagnosis present

## 2018-09-29 DIAGNOSIS — B9629 Other Escherichia coli [E. coli] as the cause of diseases classified elsewhere: Secondary | ICD-10-CM | POA: Diagnosis not present

## 2018-09-29 DIAGNOSIS — Z8673 Personal history of transient ischemic attack (TIA), and cerebral infarction without residual deficits: Secondary | ICD-10-CM | POA: Diagnosis not present

## 2018-09-29 DIAGNOSIS — F05 Delirium due to known physiological condition: Secondary | ICD-10-CM | POA: Diagnosis present

## 2018-09-29 DIAGNOSIS — Z9181 History of falling: Secondary | ICD-10-CM | POA: Diagnosis not present

## 2018-09-29 DIAGNOSIS — Z66 Do not resuscitate: Secondary | ICD-10-CM | POA: Diagnosis present

## 2018-09-29 DIAGNOSIS — R1310 Dysphagia, unspecified: Secondary | ICD-10-CM | POA: Diagnosis present

## 2018-09-29 DIAGNOSIS — R410 Disorientation, unspecified: Secondary | ICD-10-CM | POA: Diagnosis not present

## 2018-09-29 DIAGNOSIS — M4800 Spinal stenosis, site unspecified: Secondary | ICD-10-CM | POA: Diagnosis not present

## 2018-09-29 DIAGNOSIS — G9340 Encephalopathy, unspecified: Secondary | ICD-10-CM

## 2018-09-29 DIAGNOSIS — M6281 Muscle weakness (generalized): Secondary | ICD-10-CM | POA: Diagnosis not present

## 2018-09-29 DIAGNOSIS — E785 Hyperlipidemia, unspecified: Secondary | ICD-10-CM | POA: Diagnosis not present

## 2018-09-29 DIAGNOSIS — F0391 Unspecified dementia with behavioral disturbance: Secondary | ICD-10-CM | POA: Diagnosis present

## 2018-09-29 DIAGNOSIS — K219 Gastro-esophageal reflux disease without esophagitis: Secondary | ICD-10-CM | POA: Diagnosis present

## 2018-09-29 DIAGNOSIS — N390 Urinary tract infection, site not specified: Secondary | ICD-10-CM | POA: Diagnosis present

## 2018-09-29 DIAGNOSIS — R41841 Cognitive communication deficit: Secondary | ICD-10-CM | POA: Diagnosis not present

## 2018-09-29 DIAGNOSIS — K5900 Constipation, unspecified: Secondary | ICD-10-CM | POA: Diagnosis not present

## 2018-09-29 DIAGNOSIS — M170 Bilateral primary osteoarthritis of knee: Secondary | ICD-10-CM | POA: Diagnosis present

## 2018-09-29 DIAGNOSIS — M2550 Pain in unspecified joint: Secondary | ICD-10-CM | POA: Diagnosis not present

## 2018-09-29 DIAGNOSIS — Z9049 Acquired absence of other specified parts of digestive tract: Secondary | ICD-10-CM | POA: Diagnosis not present

## 2018-09-29 DIAGNOSIS — R4702 Dysphasia: Secondary | ICD-10-CM | POA: Diagnosis not present

## 2018-09-29 DIAGNOSIS — R293 Abnormal posture: Secondary | ICD-10-CM | POA: Diagnosis not present

## 2018-09-29 DIAGNOSIS — Z9013 Acquired absence of bilateral breasts and nipples: Secondary | ICD-10-CM | POA: Diagnosis not present

## 2018-09-29 DIAGNOSIS — G9341 Metabolic encephalopathy: Secondary | ICD-10-CM | POA: Diagnosis present

## 2018-09-29 DIAGNOSIS — F0390 Unspecified dementia without behavioral disturbance: Secondary | ICD-10-CM | POA: Diagnosis not present

## 2018-09-29 DIAGNOSIS — R5381 Other malaise: Secondary | ICD-10-CM | POA: Diagnosis not present

## 2018-09-29 LAB — CBC
HCT: 38.6 % (ref 36.0–46.0)
Hemoglobin: 12.1 g/dL (ref 12.0–15.0)
MCH: 27.5 pg (ref 26.0–34.0)
MCHC: 31.3 g/dL (ref 30.0–36.0)
MCV: 87.7 fL (ref 80.0–100.0)
Platelets: 226 10*3/uL (ref 150–400)
RBC: 4.4 MIL/uL (ref 3.87–5.11)
RDW: 15.2 % (ref 11.5–15.5)
WBC: 5.6 10*3/uL (ref 4.0–10.5)
nRBC: 0 % (ref 0.0–0.2)

## 2018-09-29 LAB — BASIC METABOLIC PANEL
Anion gap: 11 (ref 5–15)
BUN: 10 mg/dL (ref 8–23)
CO2: 23 mmol/L (ref 22–32)
Calcium: 8.5 mg/dL — ABNORMAL LOW (ref 8.9–10.3)
Chloride: 101 mmol/L (ref 98–111)
Creatinine, Ser: 0.72 mg/dL (ref 0.44–1.00)
GFR calc Af Amer: >60 mL/min (ref >60)
GFR calc non Af Amer: >60 mL/min (ref >60)
Glucose, Bld: 94 mg/dL (ref 70–99)
Potassium: 5.3 mmol/L — ABNORMAL HIGH (ref 3.5–5.1)
Sodium: 135 mmol/L (ref 135–145)

## 2018-09-29 LAB — GLUCOSE, CAPILLARY
Glucose-Capillary: 65 mg/dL — ABNORMAL LOW (ref 70–99)
Glucose-Capillary: 94 mg/dL (ref 70–99)

## 2018-09-29 MED ORDER — DEXTROSE-NACL 5-0.45 % IV SOLN
INTRAVENOUS | Status: DC
  Administered 2018-09-29 – 2018-09-30 (×3): via INTRAVENOUS

## 2018-09-29 NOTE — Evaluation (Addendum)
Physical Therapy Evaluation and Discharge Patient Details Name: Jennifer [name] MRN: [id number] DOB: 01-06-1923 Today's Date: 09/29/2018   History of Present Illness  Pt is a 85 y/o female admitted from SNF secondary to slurred speech and AMS. Pt with recent diagnosis of UTI. CT of head negative for acute abnormality. PMH includes dementia, COPD, CAD, and L TKA.   Clinical Impression  Pt admitted secondary to problem above with deficits below. Pt mumbling throughout session and not following commands; per daughter this is new. Required total A to roll this session. Pt from SNF, and per daughter requires hoyer lift for transfers. Pt likely close to baseline for mobility, so will defer further PT needs to SNF. Will sign off. If needs change, please re-consult.      Follow Up Recommendations SNF;Supervision/Assistance - 24 hour    Equipment Recommendations  None recommended by PT    Recommendations for Other Services       Precautions / Restrictions Precautions Precautions: Fall Restrictions Weight Bearing Restrictions: No      Mobility  Bed Mobility Overal bed mobility: Needs Assistance Bed Mobility: Rolling Rolling: Total assist         General bed mobility comments: total A to roll this session. Per daughter pt requires hoyer lift for transfers. Unsafe to attempt further mobility with +1 assist.   Transfers                    Ambulation/Gait                Stairs            Wheelchair Mobility    Modified Rankin (Stroke Patients Only)       Balance                                             Pertinent Vitals/Pain Pain Assessment: Faces Faces Pain Scale: No hurt    Home Living Family/patient expects to be discharged to:: Skilled nursing facility                      Prior Function Level of Independence: Needs assistance   Gait / Transfers Assistance Needed: Per daughter, staff at SNF uses hoyers for transfers  to [facility] and back to bed.   ADL's / Homemaking Assistance Needed: Requires assist from staff for ADL tasks.         Hand Dominance        Extremity/Trunk Assessment   Upper Extremity Assessment Upper Extremity Assessment: Defer to OT evaluation    Lower Extremity Assessment Lower Extremity Assessment: Generalized weakness       Communication   Communication: Expressive difficulties(slurred speech and mumbling)  Cognition Arousal/Alertness: Lethargic Behavior During Therapy: Flat affect Overall Cognitive Status: Difficult to assess                                 General Comments: Pt mumbling throughout session which daughter reports is new. Pt not following any commands. Initially with eyes closed, however, when daughter entered, pt opened her eyes. Pt's daughter reports pt normally is able to converse some and answer questions. Was not able to tell me her daughter was present.       General Comments      Exercises  Assessment/Plan    PT Assessment All further PT needs can be met in the next venue of care  PT Problem List Decreased strength;Decreased balance;Decreased mobility;Decreased coordination;Decreased knowledge of use of DME;Decreased cognition;Decreased safety awareness;Decreased knowledge of precautions       PT Treatment Interventions      PT Goals (Current goals can be found in the Care Plan section)  Acute Rehab PT Goals Patient Stated Goal: for pt to get better per daughter PT Goal Formulation: With family Time For Goal Achievement: 09/29/18 Potential to Achieve Goals: Fair    Frequency     Barriers to discharge        Co-evaluation               AM-PAC PT "6 Clicks" Mobility  Outcome Measure Help needed turning from your back to your side while in a flat bed without using bedrails?: Total Help needed moving from lying on your back to sitting on the side of a flat bed without using bedrails?: Total Help needed moving  to and from a bed to a chair (including a wheelchair)?: Total Help needed standing up from a chair using your arms (e.g., wheelchair or bedside chair)?: Total Help needed to walk in hospital room?: Total Help needed climbing 3-5 steps with a railing? : Total 6 Click Score: 6    End of Session   Activity Tolerance: Patient limited by lethargy Patient left: in bed;with call bell/phone within reach;with family/visitor present Nurse Communication: Mobility status;Other (comment)(daughter wanting to speak with RN about medical concerns) PT Visit Diagnosis: Difficulty in walking, not elsewhere classified (R26.2);Other symptoms and signs involving the nervous system (G68.873)    Time: 7308-1683 PT Time Calculation (min) (ACUTE ONLY): 15 min   Charges:   PT Evaluation $PT Eval Moderate Complexity: Wright, PT, DPT  Acute Rehabilitation Services  Pager: [phone] Office: [phone]   Rudean Hitt 09/29/2018, 3:23 PM

## 2018-09-29 NOTE — Evaluation (Signed)
Clinical/Bedside Swallow Evaluation Patient Details  Name: Jennifer [name] MRN: [id number] Date of Birth: 06-05-1922  Today's Date: 09/29/2018 Time: SLP Start Time (ACUTE ONLY): 6160 SLP Stop Time (ACUTE ONLY): 1445 SLP Time Calculation (min) (ACUTE ONLY): 25 min  Past Medical History:  Past Medical History:  Diagnosis Date  . Anemia    iron deficiency post op.  Marland Kitchen CAD (coronary artery disease) 2008   Cath 40% mid LAD, 35% RCA  . Cellulitis of finger    right   . Chest pain 2006  . Chronic back pain   . COPD (chronic obstructive pulmonary disease) ([place])   . Gastroesophageal reflux disease   . Hiatal hernia   . Hyperlipidemia   . Memory deficit   . Nicotine addiction   . Osteoarthritis    status post left TKA; surgery on the right is anticipated in the near future   . Pulmonary nodules    stable since 2006  . Shingles    right breast   . Skin infection   . Ulcer    gential  . Use of cane as ambulatory aid    Past Surgical History:  Past Surgical History:  Procedure Laterality Date  . ABDOMINAL HYSTERECTOMY    . BREAST SURGERY N/A    bilateral  . CARDIAC CATHETERIZATION  2008   Mid LAD 40%, RCA 25%.   . CHOLECYSTECTOMY    . COMBINED HYSTERECTOMY ABDOMINAL W/ A&P REPAIR / OOPHORECTOMY  approx. 40 years ago   . EYE SURGERY    . MASTECTOMY  1998 & 2007    right -1998 / left 2007  . right knee replacement  09/2009   Dr. Aline Brochure  . TOTAL KNEE ARTHROPLASTY  4/08   left    HPI:  85 year old female admitted 09/28/2018 with AMS, slurred speech. PMH: dementia, coronary artery disease, COPD, GERD, osteoarthritis, hyperlipidemia   Assessment / Plan / Recommendation Clinical Impression  Pt was seen at bedside for assessment of swallow function and safety, and to identify least restrictive diet. Pt was awake and restless, fidgeting with bed linens. She was nonvocal, and did not follow commands. Pt is edentulous. Pt appeared to tolerate trials of thin liquid, nectar thick  liquid, and puree without significant oral residue or overt s/s aspiration. RN was present for med administration. Pt was initially unable to swallow a pill in puree, however, with subsequent boluses of puree with crushed meds, oral cavity was clear, likely indicating pt swallowed the pill. Recommend puree diet with thin liquids, crushed meds, 1:1 assist with all po intake. Safe swallow precautions posted at [facility]. RN and MD informed of results and recommendations. SLP will follow for diet tolerance assessment and education.    SLP Visit Diagnosis: Dysphagia, unspecified (R13.10)    Aspiration Risk  Mild aspiration risk    Diet Recommendation Dysphagia 1 (Puree);Thin liquid   Liquid Administration via: Straw Medication Administration: Crushed with puree Supervision: Staff to assist with self feeding;Full supervision/cueing for compensatory strategies Compensations: Slow rate;Small sips/bites;Minimize environmental distractions Postural Changes: Seated upright at 90 degrees;Remain upright for at least 30 minutes after po intake    Other  Recommendations Oral Care Recommendations: Oral care QID   Follow up Recommendations 24 hour supervision/assistance      Frequency and Duration min 1 x/week  1 week       Prognosis Prognosis for Safe Diet Advancement: Fair Barriers to Reach Goals: Cognitive deficits;Time post onset;Severity of deficits;Behavior      Swallow Study  General Date of Onset: 09/28/18 HPI: 85 year old female admitted 09/28/2018 with AMS, slurred speech. PMH: dementia, coronary artery disease, COPD, GERD, osteoarthritis, hyperlipidemia Type of Study: Bedside Swallow Evaluation Previous Swallow Assessment: BSE April 2020 - dys 3/thin (with dentures in place) Diet Prior to this Study: NPO Temperature Spikes Noted: No Respiratory Status: Room air History of Recent Intubation: No Behavior/Cognition: Alert;Requires cueing;Doesn't follow directions;Confused;Other  (Comment)(restless, fidgeting) Oral Cavity Assessment: Within Functional Limits Oral Care Completed by SLP: No Oral Cavity - Dentition: Edentulous Self-Feeding Abilities: Total assist Patient Positioning: Upright in bed Baseline Vocal Quality: Not observed Volitional Cough: Cognitively unable to elicit Volitional Swallow: Unable to elicit    Oral/Motor/Sensory Function Overall Oral Motor/Sensory Function: (unable to assess)   Ice Chips Ice chips: Not tested   Thin Liquid Thin Liquid: Within functional limits Presentation: Straw    Nectar Thick Nectar Thick Liquid: Within functional limits Presentation: Straw   Honey Thick Honey Thick Liquid: Not tested   Puree Puree: Within functional limits Presentation: Spoon   Solid     Solid: Not tested     Sherrelle Prochazka B. Murvin NatalBueche, [facility] Urology Institute LLCMSP, [name] Speech Language Pathologist [phone]  Leigh AuroraBueche, Carmina Walle Brown 09/29/2018,2:56 PM

## 2018-09-29 NOTE — ED Notes (Addendum)
Pt moved to purple zone bed 56.  Remains confused and intermittently pulls at her lines.   Alert with speech unable to understand.

## 2018-09-29 NOTE — Plan of Care (Signed)

## 2018-09-29 NOTE — Procedures (Signed)
Patient Name: SAVILLA TURBYFILL  MRN: [id number]  Epilepsy Attending: Lora Havens  Referring Physician/Provider: Dr Zeb Comfort Date: 09/29/2018 Duration: 23.35 mins  Patient history: 85 year old female with dementia who presented with altered mental status.  EEG to evaluate for seizures.  Level of alertness: Awake/lethargic  AEDs during EEG study: None  Technical aspects: This EEG study was done with scalp electrodes positioned according to the 10-20 International system of electrode placement. Electrical activity was acquired at a sampling rate of 500Hz  and reviewed with a high frequency filter of 70Hz  and a low frequency filter of 1Hz . EEG data were recorded continuously and digitally stored.   DESCRIPTION: The posterior dominant rhythm consists of 7 Hz activity of moderate voltage (25-35 uV) seen predominantly in posterior head regions, symmetric and reactive to eye opening and eye closing.  There was also 2 to 5 Hz theta-delta slowing. Hyperventilation and photic stimulation were not performed.  ABNORMALITY: -Continuous low, generalized  IMPRESSION: This study is suggestive of mild diffuse encephalopathy. No seizures or epileptiform discharges were seen throughout the recording.  Alhassan Everingham Barbra Sarks

## 2018-09-29 NOTE — ED Notes (Signed)
ED TO INPATIENT HANDOFF REPORT  ED Nurse Name and Phone #:   S Name/Age/Gender Jennifer BeetsVelma M [name] 85 y.o. female Room/Bed: 056C/056C  Code Status   Code Status: DNR  Home/SNF/Other Skilled nursing facility Patient oriented to: self Is this baseline? No   Triage Complete: Triage complete  Chief Complaint AMS  Triage Note Altered mental status. Baseline is usually talkative and oriented. Recently treated for UTI.    Allergies Allergies  Allergen Reactions  . Aricept [Donepezil Hcl] Other (See Comments)    confusion  . Iohexol      Desc: PT ALLERGIC TO CONTRAST- SHE CAN'T BREATH   . Iron   . Penicillins     Has patient had a PCN reaction causing immediate rash, facial/tongue/throat swelling, SOB or lightheadedness with hypotension:  unknown Has patient had a PCN reaction causing severe rash involving mucus membranes or skin necrosis: unknown Has patient had a PCN reaction that required hospitalization unknown Has patient had a PCN reaction occurring within the last 10 years: unknown If all of the above answers are "NO", then may proceed with Cephalosporin use.     Level of Care/Admitting Diagnosis ED Disposition    ED Disposition Condition Comment   Admit  [facility] [[id number]]  Level of Care: Telemetry Medical [104]  I expect the patient will be discharged within 24 hours: No (not a candidate for 5C-Observation unit)  Covid Evaluation: Asymptomatic Screening Protocol (No Symptoms)  Diagnosis: Altered mental state [218921]  Admitting Physician: Ollen BowlHWANI, RINKA R [[id number]]  Attending Physician: Ollen BowlPAHWANI, RINKA R [phone][[id number]]  PT Class (Do Not Modify): Observation [104]  PT Acc Code (Do Not Modify): Observation [10022]       B Medical/Surgery History Past Medical History:  Diagnosis Date  . Anemia    iron deficiency post op.  Marland Kitchen. CAD (coronary artery disease) 2008   Cath 40% mid LAD, 35% RCA  . Cellulitis of finger    right   .  Chest pain 2006  . Chronic back pain   . COPD (chronic obstructive pulmonary disease) (HCC)   . Gastroesophageal reflux disease   . Hiatal hernia   . Hyperlipidemia   . Memory deficit   . Nicotine addiction   . Osteoarthritis    status post left TKA; surgery on the right is anticipated in the near future   . Pulmonary nodules    stable since 2006  . Shingles    right breast   . Skin infection   . Ulcer    gential  . Use of cane as ambulatory aid    Past Surgical History:  Procedure Laterality Date  . ABDOMINAL HYSTERECTOMY    . BREAST SURGERY N/A    bilateral  . CARDIAC CATHETERIZATION  2008   Mid LAD 40%, RCA 25%.   . CHOLECYSTECTOMY    . COMBINED HYSTERECTOMY ABDOMINAL W/ A&P REPAIR / OOPHORECTOMY  approx. 40 years ago   . EYE SURGERY    . MASTECTOMY  1998 & 2007    right -1998 / left 2007  . right knee replacement  09/2009   Dr. Romeo AppleHarrison  . TOTAL KNEE ARTHROPLASTY  4/08   left      A IV Location/Drains/Wounds Patient Lines/Drains/Airways Status   Active Line/Drains/Airways    Name:   Placement date:   Placement time:   Site:   Days:   Peripheral IV 09/28/18 Right;Lateral Forearm   09/28/18    1636    Forearm   1  Peripheral IV 09/29/18 Right Other (Comment)   09/29/18    0150    Other (Comment)   less than 1   External Urinary Catheter   04/26/18    2044    -   156          Intake/Output Last 24 hours  Intake/Output Summary (Last 24 hours) at 09/29/2018 1305 Last data filed at 09/29/2018 1158 Gross per 24 hour  Intake 800 ml  Output 500 ml  Net 300 ml    Labs/Imaging Results for orders placed or performed during the hospital encounter of 09/28/18 (from the past 48 hour(s))  SARS CORONAVIRUS 2 (TAT 6-24 HRS) Nasopharyngeal Nasopharyngeal Swab     Status: None   Collection Time: 09/28/18 12:29 PM   Specimen: Nasopharyngeal Swab  Result Value Ref Range   SARS Coronavirus 2 NEGATIVE NEGATIVE    Comment: (NOTE) SARS-CoV-2 target nucleic acids are NOT  DETECTED. The SARS-CoV-2 RNA is generally detectable in upper and lower respiratory specimens during the acute phase of infection. Negative results do not preclude SARS-CoV-2 infection, do not rule out co-infections with other pathogens, and should not be used as the sole basis for treatment or other patient management decisions. Negative results must be combined with clinical observations, patient history, and epidemiological information. The expected result is Negative. Fact Sheet for Patients: HairSlick.nohttps://www.fda.gov/media/138098/download Fact Sheet for Healthcare Providers: quierodirigir.comhttps://www.fda.gov/media/138095/download This test is not yet approved or cleared by the [place] FDA and  has been authorized for detection and/or diagnosis of SARS-CoV-2 by FDA under an Emergency Use Authorization (EUA). This EUA will remain  in effect (meaning this test can be used) for the duration of the COVID-19 declaration under Section 56 4(b)(1) of the Act, 21 U.S.C. section 360bbb-3(b)(1), unless the authorization is terminated or revoked sooner. Performed at [facility], [address] [place], [place] [address]   Urinalysis, Routine w reflex microscopic     Status: Abnormal   Collection Time: 09/28/18  1:22 PM  Result Value Ref Range   Color, Urine YELLOW YELLOW   APPearance CLOUDY (A) CLEAR   Specific Gravity, Urine 1.012 1.005 - 1.030   pH 6.0 5.0 - 8.0   Glucose, UA NEGATIVE NEGATIVE mg/dL   Hgb urine dipstick SMALL (A) NEGATIVE   Bilirubin Urine NEGATIVE NEGATIVE   Ketones, ur NEGATIVE NEGATIVE mg/dL   Protein, ur NEGATIVE NEGATIVE mg/dL   Nitrite POSITIVE (A) NEGATIVE   Leukocytes,Ua LARGE (A) NEGATIVE   RBC / HPF 0-5 0 - 5 RBC/hpf   WBC, UA 21-50 0 - 5 WBC/hpf   Bacteria, UA MANY (A) NONE SEEN   Squamous Epithelial / LPF 0-5 0 - 5   WBC Clumps PRESENT     Comment: Performed at [facility], [address] [place], [place] [address]  Urine culture     Status:  Abnormal (Preliminary result)   Collection Time: 09/28/18  1:22 PM   Specimen: Urine, Random  Result Value Ref Range   Specimen Description URINE, RANDOM    Special Requests      NONE Performed at [facility], [address] [place], [place] [address]    Culture >=100,000 COLONIES/mL ESCHERICHIA COLI (A)    Report Status PENDING   CBC with Differential     Status: Abnormal   Collection Time: 09/28/18  1:45 PM  Result Value Ref Range   WBC 5.4 4.0 - 10.5 K/uL   RBC 4.34 3.87 - 5.11 MIL/uL  Hemoglobin 11.5 (L) 12.0 - 15.0 g/dL   HCT 16.1 09.6 - 04.5 %   MCV 90.3 80.0 - 100.0 fL   MCH 26.5 26.0 - 34.0 pg   MCHC 29.3 (L) 30.0 - 36.0 g/dL   RDW 40.9 81.1 - 91.4 %   Platelets 283 150 - 400 K/uL   nRBC 0.0 0.0 - 0.2 %   Neutrophils Relative % 55 %   Neutro Abs 3.0 1.7 - 7.7 K/uL   Lymphocytes Relative 31 %   Lymphs Abs 1.6 0.7 - 4.0 K/uL   Monocytes Relative 11 %   Monocytes Absolute 0.6 0.1 - 1.0 K/uL   Eosinophils Relative 3 %   Eosinophils Absolute 0.2 0.0 - 0.5 K/uL   Basophils Relative 0 %   Basophils Absolute 0.0 0.0 - 0.1 K/uL   Immature Granulocytes 0 %   Abs Immature Granulocytes 0.02 0.00 - 0.07 K/uL    Comment: Performed at [facility], [address] [place], [place] [address]  Comprehensive metabolic panel     Status: Abnormal   Collection Time: 09/28/18  1:45 PM  Result Value Ref Range   Sodium 137 135 - 145 mmol/L   Potassium 5.0 3.5 - 5.1 mmol/L    Comment: SLIGHT HEMOLYSIS   Chloride 104 98 - 111 mmol/L   CO2 22 22 - 32 mmol/L   Glucose, Bld 84 70 - 99 mg/dL   BUN 14 8 - 23 mg/dL   Creatinine, Ser 6.21 0.44 - 1.00 mg/dL   Calcium 8.7 (L) 8.9 - 10.3 mg/dL   Total Protein 6.4 (L) 6.5 - 8.1 g/dL   Albumin 2.7 (L) 3.5 - 5.0 g/dL   AST 21 15 - 41 U/L   ALT 9 0 - 44 U/L   Alkaline Phosphatase 99 38 - 126 U/L   Total Bilirubin 0.6 0.3 - 1.2 mg/dL   GFR calc non Af Amer 53 (L) >60 mL/min   GFR calc Af Amer >60 >60 mL/min   Anion gap 11 5 -  15    Comment: Performed at [facility], [address], [place], [place] [address]  Troponin I (High Sensitivity)     Status: None   Collection Time: 09/28/18  1:45 PM  Result Value Ref Range   Troponin I (High Sensitivity) 5 <18 ng/L    Comment: (NOTE) Elevated high sensitivity troponin I (hsTnI) values and significant  changes across serial measurements may suggest ACS but many other  chronic and acute conditions are known to elevate hsTnI results.  Refer to the Links section for chest pain algorithms and additional  guidance. Performed at [facility], [address], [place], [place] [address]   Ammonia     Status: None   Collection Time: 09/28/18  2:55 PM  Result Value Ref Range   Ammonia 19 9 - 35 umol/L    Comment: Performed at [facility], [address], [place], [place] [address]  Troponin I (High Sensitivity)     Status: None   Collection Time: 09/28/18  2:59 PM  Result Value Ref Range   Troponin I (High Sensitivity) 5 <18 ng/L    Comment: (NOTE) Elevated high sensitivity troponin I (hsTnI) values and significant  changes across serial measurements may suggest ACS but many other  chronic and acute conditions are known to elevate hsTnI results.  Refer to the "Links" section for chest pain algorithms and additional  guidance. Performed at [facility],  [address], [place], [place] [address]   CBC     Status: Abnormal   Collection Time: 09/28/18  6:23 PM  Result Value Ref Range   WBC 5.9 4.0 - 10.5 K/uL   RBC 4.12 3.87 - 5.11 MIL/uL   Hemoglobin 11.1 (L) 12.0 - 15.0 g/dL   HCT 37.9 36.0 - 46.0 %   MCV 92.0 80.0 - 100.0 fL   MCH 26.9 26.0 - 34.0 pg   MCHC 29.3 (L) 30.0 - 36.0 g/dL   RDW 15.6 (H) 11.5 - 15.5 %   Platelets 229 150 - 400 K/uL   nRBC 0.0 0.0 - 0.2 %    Comment: Performed at [facility], [place] [address], [place], [place] [address]  Creatinine, serum     Status: None   Collection Time: 09/28/18  6:23 PM  Result  Value Ref Range   Creatinine, Ser 0.82 0.44 - 1.00 mg/dL   GFR calc non Af Amer >60 >60 mL/min   GFR calc Af Amer >60 >60 mL/min    Comment: Performed at [place] [address] [place], [place] [address]  Magnesium     Status: None   Collection Time: 09/28/18  6:23 PM  Result Value Ref Range   Magnesium 1.8 1.7 - 2.4 mg/dL    Comment: Performed at [facility], [place] [address], [place], [place] [address]  Phosphorus     Status: None   Collection Time: 09/28/18  6:23 PM  Result Value Ref Range   Phosphorus 3.7 2.5 - 4.6 mg/dL    Comment: Performed at [facility], [place] [address], [place], [place] [address]  TSH     Status: None   Collection Time: 09/28/18  6:23 PM  Result Value Ref Range   TSH 1.471 0.350 - 4.500 uIU/mL    Comment: Performed by a 3rd Generation assay with a functional sensitivity of <=0.01 uIU/mL. Performed at [facility], [place] [address] [place], [place] [address]   Basic metabolic panel     Status: Abnormal   Collection Time: 09/29/18  3:46 AM  Result Value Ref Range   Sodium 135 135 - 145 mmol/L   Potassium 5.3 (H) 3.5 - 5.1 mmol/L    Comment: HEMOLYSIS AT THIS LEVEL MAY AFFECT RESULT   Chloride 101 98 - 111 mmol/L   CO2 23 22 - 32 mmol/L   Glucose, Bld 94 70 - 99 mg/dL   BUN 10 8 - 23 mg/dL   Creatinine, Ser 0.72 0.44 - 1.00 mg/dL   Calcium 8.5 (L) 8.9 - 10.3 mg/dL   GFR calc non Af Amer >60 >60 mL/min   GFR calc Af Amer >60 >60 mL/min   Anion gap 11 5 - 15    Comment: Performed at [facility], [place] [address], [place], [place] [address]  CBC     Status: None   Collection Time: 09/29/18  3:46 AM  Result Value Ref Range   WBC 5.6 4.0 - 10.5 K/uL   RBC 4.40 3.87 - 5.11 MIL/uL   Hemoglobin 12.1 12.0 - 15.0 g/dL   HCT 38.6 36.0 - 46.0 %   MCV 87.7 80.0 - 100.0 fL   MCH 27.5 26.0 - 34.0 pg   MCHC 31.3 30.0 - 36.0 g/dL   RDW 15.2 11.5 - 15.5 %   Platelets 226 150 - 400 K/uL   nRBC 0.0 0.0 - 0.2 %    Comment:  Performed at [facility], [address] [place], [place] [address]   Ct Head Wo Contrast  Result Date: 09/28/2018 CLINICAL DATA:  Unexplained altered level of consciousness. EXAM: CT HEAD WITHOUT CONTRAST TECHNIQUE: Contiguous axial images were obtained from the base of the skull through the vertex without intravenous contrast. COMPARISON:  04/26/2018 FINDINGS: Brain: Today's study suffers from some motion degradation. Allowing for that, no change is appreciated. Age related atrophy. Chronic small-vessel ischemic changes of the cerebral hemispheric white matter. No sign of acute infarction, mass lesion, hemorrhage, hydrocephalus or extra-axial collection. Vascular: There is atherosclerotic calcification of the major vessels at the base of the brain. Skull: Negative Sinuses/Orbits: Clear/normal Other: None IMPRESSION: No acute finding. Motion degraded exam. Brain atrophy with some temporal predominance. Extensive chronic small-vessel ischemic changes of the white matter. Electronically Signed   By: Paulina Fusi M.D.   On: 09/28/2018 12:18   Dg Chest Port 1 View  Result Date: 09/28/2018 CLINICAL DATA:  Altered mental status EXAM: PORTABLE CHEST 1 VIEW COMPARISON:  04/24/2018 FINDINGS: Cardiac silhouette remains mildly enlarged. Aorta is calcified and tortuous. Low lung volumes. Chronic coarsened interstitial markings bilaterally. The previously seen right upper lobe opacity is no longer evident. No new focal airspace consolidation identified. No large pleural fluid collection. No pneumothorax. Colonic interposition under the right hemidiaphragm. Bones are demineralized. IMPRESSION: Expiratory chest radiograph with chronic bilateral lung changes. No definite superimposed opacity. Electronically Signed   By: Duanne Guess M.D.   On: 09/28/2018 11:15    Pending Labs Unresulted Labs (From admission, onward)    Start     Ordered   10/05/18 0500  Creatinine, serum  (enoxaparin (LOVENOX)    CrCl >/=  30 ml/min)  Weekly,   R    Comments: while on enoxaparin therapy   Question:  Specimen collection method  Answer:  IV Team=IV Team collect   09/28/18 1650   09/28/18 1654  Drugs of abuse scrn w alc, routine urine (ref lab)  Once,   STAT     09/28/18 1653          Vitals/Pain Today's Vitals   09/29/18 0815 09/29/18 0842 09/29/18 0900 09/29/18 0945  BP: (!) 142/95 (!) 142/95 (!) 155/72 (!) 172/94  Pulse: 76 75 70 88  Resp: 19 16 15 19   Temp:      TempSrc:      SpO2: 100% 100% 100% 99%    Isolation Precautions No active isolations  Medications Medications  LORazepam (ATIVAN) tablet 0.5 mg (0 mg Oral Hold 09/28/18 1904)  enoxaparin (LOVENOX) injection 40 mg (40 mg Subcutaneous Given 09/28/18 1941)  ondansetron (ZOFRAN) tablet 4 mg (has no administration in time range)    Or  ondansetron (ZOFRAN) injection 4 mg (has no administration in time range)  cefTRIAXone (ROCEPHIN) 1 g in sodium chloride 0.9 % 100 mL IVPB (has no administration in time range)  pantoprazole (PROTONIX) EC tablet 40 mg (0 mg Oral Hold 09/28/18 1905)  senna-docusate (Senokot-S) tablet 2 tablet (has no administration in time range)  oxybutynin (DITROPAN-XL) 24 hr tablet 5 mg (0 mg Oral Hold 09/29/18 0048)  gabapentin (NEURONTIN) capsule 100 mg (0 mg Oral Hold 09/29/18 0048)  acetaminophen (TYLENOL) tablet 650 mg (0 mg Oral Hold 09/29/18 0049)  dextrose 5 %-0.45 % sodium chloride infusion ( Intravenous New Bag/Given 09/29/18 1253)  sodium chloride 0.9 % bolus 1,000 mL (0 mLs Intravenous Stopped 09/28/18 2200)  cefTRIAXone (ROCEPHIN) 1 g in sodium chloride 0.9 % 100 mL  IVPB (0 g Intravenous Stopped 09/29/18 0048)  dextrose 50 % solution 12.5 g (12.5 g Intravenous Given 09/28/18 2200)    Mobility walks High fall risk   Focused Assessment       R Recommendations: See Admitting Provider Note  Report given to:   Additional Notes:

## 2018-09-29 NOTE — TOC Initial Note (Signed)
Transition of Care [facility]) - Initial/Assessment Note    Patient Details  Name: Jennifer [name] MRN: [id number] Date of Birth: May 25, 1922  [facility]) CM/SW Contact:    Alberteen Sam, [place] Phone Number: [phone] 09/29/2018, 4:18 PM  Clinical Narrative:                  CSW called patient's daughter Meredith Mody to inform of SNF recommendation by PT, Meredith Mody is in agreement and reports she has reached out to [facility] where patient has been in the past and that they report they can accept patient once medically ready. [name] reports no further questions or concerns at this time.   CSW sending [facility] referral documents, patient has SNF bed when medically stable to dc.    Expected Discharge Plan: Skilled Nursing Facility Barriers to Discharge: Continued Medical Work up   Patient Goals and CMS Choice   CMS Medicare.gov Compare Post Acute Care list provided to:: Patient Represenative (must comment)([name] (daughter)) Choice offered to / list presented to : Adult Children([name])  Expected Discharge Plan and Services Expected Discharge Plan: [place] Choice: [place] arrangements for the past 2 months: Single Family Home                                      Prior Living Arrangements/Services Living arrangements for the past 2 months: Single Family Home Lives with:: Self Patient language and need for interpreter reviewed:: Yes Do you feel safe going back to the place where you live?: No   needs short term rehab  Need for Family Participation in Patient Care: Yes (Comment) Care giver support system in place?: Yes (comment)   Criminal Activity/Legal Involvement Pertinent to Current Situation/Hospitalization: No - Comment as needed  Activities of Daily Living      Permission Sought/Granted Permission sought to share information with : Case Manager, Customer service manager, Family  Supports Permission granted to share information with : Yes, Verbal Permission Granted  Share Information with NAME: Meredith Mody  Permission granted to share info w AGENCY: SNFs  Permission granted to share info w Relationship: daughter  Permission granted to share info w Contact Information: [phone]  Emotional Assessment Appearance:: Appears stated age Attitude/Demeanor/Rapport: Unable to Assess Affect (typically observed): Unable to Assess Orientation: : Oriented to Self Alcohol / Substance Use: Not Applicable Psych Involvement: No (comment)  Admission diagnosis:  Lower urinary tract infectious disease [N39.0] Encephalopathy acute [J82.50] Acute metabolic encephalopathy [N39.76] Patient Active Problem List   Diagnosis Date Noted  . Acute metabolic encephalopathy 73/41/9379  . Altered mental state 09/28/2018  . Lower urinary tract infectious disease 09/28/2018  . Encephalopathy acute 04/26/2018  . Elevated blood pressure, situational 04/26/2018  . Positive blood culture 04/26/2018  . Shingles (herpes zoster) polyneuropathy 03/11/2018  . Advanced care planning/counseling discussion   . Palliative care by specialist   . Goals of care, counseling/discussion   . Altered mental status 01/24/2018  . Frequent falls 01/24/2018  . Burnout of caregiver 07/21/2016  . DNR no code (do not resuscitate) 04/25/2014  . Urinary incontinence 04/04/2013  . Osteoporosis, post-menopausal 12/06/2012  . At high risk for falls 07/30/2012  . Dementia without behavioral disturbance ([place]) 08/19/2011  . COPD (chronic obstructive pulmonary disease) ([place]) 06/25/2011  . Allergic rhinitis 12/23/2010  . CEREBROVASCULAR DISEASE 08/05/2009  . Spinal stenosis 06/09/2007  . Coronary  atherosclerosis 01/25/2007  . HIATAL HERNIA WITH REFLUX 01/25/2007  . Osteoarthritis 01/25/2007   PCP:  Kerri PerchesSimpson, Margaret E, MD Pharmacy:  No Pharmacies Listed    Social Determinants of Health (SDOH) Interventions     Readmission Risk Interventions No flowsheet data found.

## 2018-09-29 NOTE — ED Notes (Signed)
Pt gown, bed, and brief soiled. Pt linen, brief, and gown changed, and new purewick placed. Pt placed in mitten restraints due to pulling EKG leads and pulse ox. IV reinforced with gauze and coban .

## 2018-09-29 NOTE — Progress Notes (Signed)
PROGRESS NOTE  MERANDA [name] RKY:[id number] DOB: July 06, 1922 DOA: 09/28/2018 PCP: Fayrene Helper, MD  HPI/Recap of past 24 hours: HPI from Dr Doristine Bosworth Christianne Dolin Jennifer [name] is a 85 y.o. female with medical history significant of dementia, coronary artery disease, COPD, osteoarthritis, hyperlipidemia brought in by ambulance from her nursing facility to emergency department for altered mental status and slurred speech. Patient's daughter [name] (cell phone [phone]) reported that she spoke to her mom on Sunday and she was doing well, was talking and asking questions, however this morning staff noted that she is not like herself. [name] also reports history of multiple urinary tract infection-recently treated with Bactrim. [name] was not in favor of MRI as she thinks that patient will not tolerate the MRI. In the ED, CT head: No acute findings. Patient was evaluated by neurology who recommended encephalopathy work-up, EEG to assess for seizures.  Patient admitted for further management.    Today, patient seen and examined at bedside, did not answer any of my questions, nor follow any commands.   Assessment/Plan: Active Problems:   Coronary atherosclerosis   Osteoarthritis   COPD (chronic obstructive pulmonary disease) (HCC)   Dementia without behavioral disturbance (HCC)   Encephalopathy acute   Altered mental state   Lower urinary tract infectious disease   Acute metabolic encephalopathy  Acute metabolic encephalopathy Likely 2/2 underlying UTI versus unlikely seizure, CVA Ammonia level within normal limits CT head negative for any acute findings Neurology consulted, low suspicion for CVA, recommended encephalopathic work-up Daughter stated patient unlikely to tolerate MRI brain, so none done EEG suggestive of mild diffuse encephalopathy, no seizure or epileptic form discharges seen If patient does not improve within the next day or so, may consider MRI of the brain  Neurochecks, monitor closely N.p.o. for now until SLP evaluates IV fluids Telemetry  UTI History of multiple UTIs in the past UA positive for UTI UC growing greater than 100,000 E. coli Continue IV ceftriaxone pending final culture report  Mild hyperkalemia Likely from recent Bactrim use Daily BMP, monitor closely  Episode of hypoglycemia Likely due to n.p.o. status Continue IV fluids D5 half-normal saline  COPD Stable on room air Continue home regimen  Dementia Continue supportive care  Chronic bilateral knee osteoarthritis Tylenol as needed PT/OT if able         Malnutrition Type:      Malnutrition Characteristics:      Nutrition Interventions:       Estimated body mass index is 29.62 kg/m as calculated from the following:   Height as of this encounter: 5\' 5"  (1.651 m).   Weight as of this encounter: 80.7 kg.     Code Status: DNR  Family Communication: Discussed with daughter over the phone  Disposition Plan: To be determined   Consultants:  Neurology  Procedures:  None  Antimicrobials:  Ceftriaxone  DVT prophylaxis: Lovenox   Objective: Vitals:   09/29/18 0900 09/29/18 0945 09/29/18 1406 09/29/18 1409  BP: (!) 155/72 (!) 172/94 (!) 154/96   Pulse: 70 88 78   Resp: 15 19 18    Temp:   98.4 F (36.9 C)   TempSrc:   Oral   SpO2: 100% 99% 100%   Weight:    80.7 kg  Height:    5\' 5"  (1.651 m)    Intake/Output Summary (Last 24 hours) at 09/29/2018 1417 Last data filed at 09/29/2018 1158 Gross per 24 hour  Intake 800 ml  Output 500 ml  Net 300 ml   Filed Weights   09/29/18 1409  Weight: 80.7 kg    Exam:  General: NAD, not following commands, refused to answer any of my questions  Cardiovascular: S1, S2 present  Respiratory: CTAB  Abdomen: Soft, nontender, nondistended, bowel sounds present  Musculoskeletal: No bilateral pedal edema noted  Skin: Normal  Psychiatry:  Intermittently agitated   Data  Reviewed: CBC: Recent Labs  Lab 09/28/18 1345 09/28/18 1823 09/29/18 0346  WBC 5.4 5.9 5.6  NEUTROABS 3.0  --   --   HGB 11.5* 11.1* 12.1  HCT 39.2 37.9 38.6  MCV 90.3 92.0 87.7  PLT 283 229 226   Basic Metabolic Panel: Recent Labs  Lab 09/28/18 1345 09/28/18 1823 09/29/18 0346  NA 137  --  135  K 5.0  --  5.3*  CL 104  --  101  CO2 22  --  23  GLUCOSE 84  --  94  BUN 14  --  10  CREATININE 0.93 0.82 0.72  CALCIUM 8.7*  --  8.5*  MG  --  1.8  --   PHOS  --  3.7  --    GFR: Estimated Creatinine Clearance: 45.1 mL/min (by C-G formula based on SCr of 0.72 mg/dL). Liver Function Tests: Recent Labs  Lab 09/28/18 1345  AST 21  ALT 9  ALKPHOS 99  BILITOT 0.6  PROT 6.4*  ALBUMIN 2.7*   No results for input(s): LIPASE, AMYLASE in the last 168 hours. Recent Labs  Lab 09/28/18 1455  AMMONIA 19   Coagulation Profile: No results for input(s): INR, PROTIME in the last 168 hours. Cardiac Enzymes: No results for input(s): CKTOTAL, CKMB, CKMBINDEX, TROPONINI in the last 168 hours. BNP (last 3 results) No results for input(s): PROBNP in the last 8760 hours. HbA1C: No results for input(s): HGBA1C in the last 72 hours. CBG: No results for input(s): GLUCAP in the last 168 hours. Lipid Profile: No results for input(s): CHOL, HDL, LDLCALC, TRIG, CHOLHDL, LDLDIRECT in the last 72 hours. Thyroid Function Tests: Recent Labs    09/28/18 1823  TSH 1.471   Anemia Panel: No results for input(s): VITAMINB12, FOLATE, FERRITIN, TIBC, IRON, RETICCTPCT in the last 72 hours. Urine analysis:    Component Value Date/Time   COLORURINE YELLOW 09/28/2018 1322   APPEARANCEUR CLOUDY (A) 09/28/2018 1322   LABSPEC 1.012 09/28/2018 1322   PHURINE 6.0 09/28/2018 1322   GLUCOSEU NEGATIVE 09/28/2018 1322   GLUCOSEU NEG mg/dL 98/33/8250 5397   HGBUR SMALL (A) 09/28/2018 1322   HGBUR negative 05/02/2008 1547   BILIRUBINUR NEGATIVE 09/28/2018 1322   BILIRUBINUR small 12/05/2012 1324    KETONESUR NEGATIVE 09/28/2018 1322   PROTEINUR NEGATIVE 09/28/2018 1322   UROBILINOGEN 1.0 12/05/2012 1324   UROBILINOGEN 0.2 05/02/2008 1547   NITRITE POSITIVE (A) 09/28/2018 1322   LEUKOCYTESUR LARGE (A) 09/28/2018 1322   Sepsis Labs: @LABRCNTIP (procalcitonin:4,lacticidven:4)  ) Recent Results (from the past 240 hour(s))  SARS CORONAVIRUS 2 (TAT 6-24 HRS) Nasopharyngeal Nasopharyngeal Swab     Status: None   Collection Time: 09/28/18 12:29 PM   Specimen: Nasopharyngeal Swab  Result Value Ref Range Status   SARS Coronavirus 2 NEGATIVE NEGATIVE Final    Comment: (NOTE) SARS-CoV-2 target nucleic acids are NOT DETECTED. The SARS-CoV-2 RNA is generally detectable in upper and lower respiratory specimens during the acute phase of infection. Negative results do not preclude SARS-CoV-2 infection, do not rule out co-infections with other pathogens, and should not be used as the sole  basis for treatment or other patient management decisions. Negative results must be combined with clinical observations, patient history, and epidemiological information. The expected result is Negative. Fact Sheet for Patients: HairSlick.nohttps://www.fda.gov/media/138098/download Fact Sheet for Healthcare Providers: quierodirigir.comhttps://www.fda.gov/media/138095/download This test is not yet approved or cleared by the [place] FDA and  has been authorized for detection and/or diagnosis of SARS-CoV-2 by FDA under an Emergency Use Authorization (EUA). This EUA will remain  in effect (meaning this test can be used) for the duration of the COVID-19 declaration under Section 56 4(b)(1) of the Act, 21 U.S.C. section 360bbb-3(b)(1), unless the authorization is terminated or revoked sooner. Performed at [facility], [address] [place], [place] [address]   Urine culture     Status: Abnormal (Preliminary result)   Collection Time: 09/28/18  1:22 PM   Specimen: Urine, Random  Result Value Ref Range Status   Specimen  Description URINE, RANDOM  Final   Special Requests   Final    NONE Performed at [facility], [address] [place], [place] [address]    Culture >=100,000 COLONIES/mL ESCHERICHIA COLI (A)  Final   Report Status PENDING  Incomplete      Studies: No results found.  Scheduled Meds: . acetaminophen  650 mg Oral TID  . enoxaparin (LOVENOX) injection  40 mg Subcutaneous Q24H  . gabapentin  100 mg Oral BID  . LORazepam  0.5 mg Oral Once  . oxybutynin  5 mg Oral QHS  . pantoprazole  40 mg Oral Daily    Continuous Infusions: . cefTRIAXone (ROCEPHIN)  IV    . dextrose 5 % and 0.45% NaCl 75 mL/hr at 09/29/18 1253     LOS: 0 days     Briant CedarNkeiruka J Ezenduka, MD [facility]  If 7PM-7AM, please contact night-coverage [email or web address] 09/29/2018, 2:17 PM

## 2018-09-29 NOTE — ED Notes (Signed)
Daughter at bedside, very concerned about her mother's diagnosis at this time.  Labs and CT results reviewed.  MD paged as well to discuss the case.  Pt remains fidgety and has slurred speech, grabs at her covers and devices removed at this time.

## 2018-09-29 NOTE — Progress Notes (Signed)
EEG Completed; Results Pending  

## 2018-09-29 NOTE — [place] FL2 (Signed)
[place] MEDICAID FL2 LEVEL OF CARE SCREENING TOOL     IDENTIFICATION  Patient Name: Jennifer [name] Birthdate: March 27, 1922 Sex: female Admission Date (Current Location): 09/28/2018  [facility] [place]   Facility and Address:  [facility], [address], [place], [place] [address]      Provider Number: [id number]  Attending Physician Name and Address:  Briant CedarEzenduka, Nkeiruka J, MD  Relative Name and Phone Number:  Dedra SkeensGwen (daughter) [phone]    Current Level of Care: Hospital Recommended Level of Care: Skilled Nursing Facility Prior Approval Number:    Date Approved/Denied:   PASRR Number: [id number]  Discharge Plan: SNF    Current Diagnoses: Patient Active Problem List   Diagnosis Date Noted  . Acute metabolic encephalopathy 09/29/2018  . Altered mental state 09/28/2018  . Lower urinary tract infectious disease 09/28/2018  . Encephalopathy acute 04/26/2018  . Elevated blood pressure, situational 04/26/2018  . Positive blood culture 04/26/2018  . Shingles (herpes zoster) polyneuropathy 03/11/2018  . Advanced care planning/counseling discussion   . Palliative care by specialist   . Goals of care, counseling/discussion   . Altered mental status 01/24/2018  . Frequent falls 01/24/2018  . Burnout of caregiver 07/21/2016  . DNR no code (do not resuscitate) 04/25/2014  . Urinary incontinence 04/04/2013  . Osteoporosis, post-menopausal 12/06/2012  . At high risk for falls 07/30/2012  . Dementia without behavioral disturbance (HCC) 08/19/2011  . COPD (chronic obstructive pulmonary disease) (HCC) 06/25/2011  . Allergic rhinitis 12/23/2010  . CEREBROVASCULAR DISEASE 08/05/2009  . Spinal stenosis 06/09/2007  . Coronary atherosclerosis 01/25/2007  . HIATAL HERNIA WITH REFLUX 01/25/2007  . Osteoarthritis 01/25/2007    Orientation RESPIRATION BLADDER Height & Weight     Self  Normal Incontinent Weight: 178 lb (80.7  kg) Height:  5\' 5"  (165.1 cm)  BEHAVIORAL SYMPTOMS/MOOD NEUROLOGICAL BOWEL NUTRITION STATUS      Incontinent Diet(Dysphagia 1 (Puree);Thin liquid)  AMBULATORY STATUS COMMUNICATION OF NEEDS Skin   Extensive Assist Verbally Normal                       Personal Care Assistance Level of Assistance  Bathing, Feeding, Dressing, Total care Bathing Assistance: Maximum assistance Feeding assistance: Maximum assistance(Dysphagia 1 (Puree);Thin liquid) Dressing Assistance: Maximum assistance Total Care Assistance: Maximum assistance   Functional Limitations Info  Sight, Hearing, Speech Sight Info: Adequate Hearing Info: Adequate Speech Info: Adequate    SPECIAL CARE FACTORS FREQUENCY  PT (By licensed PT), OT (By licensed OT)     PT Frequency: min 5x weekly OT Frequency: min 5x weekly            Contractures Contractures Info: Not present    Additional Factors Info  Code Status, Allergies Code Status Info: DNR Allergies Info: Aricept (donepezil Hcl), Iohexol, Iron, Penicllins           Current Medications (09/29/2018):  This is the current hospital active medication list Current Facility-Administered Medications  Medication Dose Route Frequency Provider Last Rate Last Dose  . acetaminophen (TYLENOL) tablet 650 mg  650 mg Oral TID Pahwani, Rinka R, MD   650 mg at 09/29/18 1424  . cefTRIAXone (ROCEPHIN) 1 g in sodium chloride 0.9 % 100 mL IVPB  1 g Intravenous Q24H Pahwani, Rinka R, MD      . dextrose 5 %-0.45 % sodium chloride infusion   Intravenous Continuous Briant CedarEzenduka, Nkeiruka J, MD 75 mL/hr at 09/29/18 1253    . enoxaparin (LOVENOX)  injection 40 mg  40 mg Subcutaneous Q24H Pahwani, Rinka R, MD   40 mg at 09/28/18 1941  . gabapentin (NEURONTIN) capsule 100 mg  100 mg Oral BID Pahwani, Rinka R, MD   100 mg at 09/29/18 1425  . LORazepam (ATIVAN) tablet 0.5 mg  0.5 mg Oral Once Hayden Rasmussen, MD   Stopped at 09/28/18 1904  . ondansetron (ZOFRAN) tablet 4 mg  4 mg Oral  Q6H PRN Pahwani, Rinka R, MD       Or  . ondansetron (ZOFRAN) injection 4 mg  4 mg Intravenous Q6H PRN Pahwani, Rinka R, MD      . oxybutynin (DITROPAN-XL) 24 hr tablet 5 mg  5 mg Oral QHS Pahwani, Rinka R, MD   Stopped at 09/29/18 0048  . pantoprazole (PROTONIX) EC tablet 40 mg  40 mg Oral Daily Pahwani, Rinka R, MD   40 mg at 09/29/18 1425  . senna-docusate (Senokot-S) tablet 2 tablet  2 tablet Oral Daily PRN Pahwani, Michell Heinrich, MD         Discharge Medications: Please see discharge summary for a list of discharge medications.  Relevant Imaging Results:  Relevant Lab Results:   Additional Information SSN: [id number]  Alberteen Sam, LCSW

## 2018-09-29 NOTE — Progress Notes (Signed)
Reason for consult: Encephalopathy   Subjective: Patient lying in bed, not cooperative.  ROS: negative except above  Examination  Vital signs in last 24 hours: Temp:  [98.5 F (36.9 C)] 98.5 F (36.9 C) (09/17 0613) Pulse Rate:  [65-88] 88 (09/17 0945) Resp:  [14-21] 19 (09/17 0945) BP: (124-172)/(72-121) 172/94 (09/17 0945) SpO2:  [94 %-100 %] 99 % (09/17 0945)  General: lying in be CVS: pulse-normal rate and rhythm RS: breathing comfortably Extremities: normal   Neuro: MS: Awake, does not follow commands or answer questions.  However, does speak sentences such as "leave me alone"  and " stop bothering me" CN: pupils equal and reactive,  EOMI, face symmetric, tongue midline, Motor: Moves all 4 extremities with good strength equally Coordination: Unable to assess Gait: not tested  Basic Metabolic Panel: Recent Labs  Lab 09/28/18 1345 09/28/18 1823 09/29/18 0346  NA 137  --  135  K 5.0  --  5.3*  CL 104  --  101  CO2 22  --  23  GLUCOSE 84  --  94  BUN 14  --  10  CREATININE 0.93 0.82 0.72  CALCIUM 8.7*  --  8.5*  MG  --  1.8  --   PHOS  --  3.7  --     CBC: Recent Labs  Lab 09/28/18 1345 09/28/18 1823 09/29/18 0346  WBC 5.4 5.9 5.6  NEUTROABS 3.0  --   --   HGB 11.5* 11.1* 12.1  HCT 39.2 37.9 38.6  MCV 90.3 92.0 87.7  PLT 283 229 226     Coagulation Studies: No results for input(s): LABPROT, INR in the last 72 hours.  Imaging Reviewed:     ASSESSMENT AND PLAN  85 year old female with dementia, coronary artery disease, COPD who presented to the emergency department with worsening confusion and slurred speech.  Patient noted to have intermittent right eye, face twitching.  Neurology was consulted for seizures.  Evaluated yesterday, twitching was nonrhythmic. EEG was performed was negative for any epileptiform discharges.  CT Head negative for acute findings. Ammonia level was normal.  Low suspicion for stroke, if patient not improving can  consider MRI brain (if she can tolerate) or repeat CT head in 24 hrs to rule out stroke.   Impression  Acute metabolic encephalopathy secondary to urinary tract infection Delirium with underlying dementia  Recommendations -Continue treatment with antibiotics - MRI Brain ( or repeat CT head)  if not improving  -Neurology will be available as needed  Karena Addison Aroor [facility] Pager Number [phone] For questions after 7pm please refer to AMION to reach the Neurologist on call

## 2018-09-29 NOTE — Telephone Encounter (Signed)
Pts daughter is calling in to speak with Dr Moshe Cipro

## 2018-09-29 NOTE — Telephone Encounter (Signed)
Wrong dr

## 2018-09-30 DIAGNOSIS — Z1612 Extended spectrum beta lactamase (ESBL) resistance: Secondary | ICD-10-CM

## 2018-09-30 DIAGNOSIS — Z23 Encounter for immunization: Secondary | ICD-10-CM | POA: Diagnosis not present

## 2018-09-30 DIAGNOSIS — H6693 Otitis media, unspecified, bilateral: Secondary | ICD-10-CM | POA: Diagnosis not present

## 2018-09-30 DIAGNOSIS — M1990 Unspecified osteoarthritis, unspecified site: Secondary | ICD-10-CM | POA: Diagnosis not present

## 2018-09-30 DIAGNOSIS — E785 Hyperlipidemia, unspecified: Secondary | ICD-10-CM | POA: Diagnosis not present

## 2018-09-30 DIAGNOSIS — B9629 Other Escherichia coli [E. coli] as the cause of diseases classified elsewhere: Secondary | ICD-10-CM | POA: Diagnosis not present

## 2018-09-30 DIAGNOSIS — R32 Unspecified urinary incontinence: Secondary | ICD-10-CM | POA: Diagnosis not present

## 2018-09-30 DIAGNOSIS — R1311 Dysphagia, oral phase: Secondary | ICD-10-CM | POA: Diagnosis not present

## 2018-09-30 DIAGNOSIS — R630 Anorexia: Secondary | ICD-10-CM | POA: Diagnosis not present

## 2018-09-30 DIAGNOSIS — R41841 Cognitive communication deficit: Secondary | ICD-10-CM | POA: Diagnosis not present

## 2018-09-30 DIAGNOSIS — K5900 Constipation, unspecified: Secondary | ICD-10-CM | POA: Diagnosis not present

## 2018-09-30 DIAGNOSIS — R5381 Other malaise: Secondary | ICD-10-CM | POA: Diagnosis not present

## 2018-09-30 DIAGNOSIS — G8929 Other chronic pain: Secondary | ICD-10-CM | POA: Diagnosis not present

## 2018-09-30 DIAGNOSIS — F0390 Unspecified dementia without behavioral disturbance: Secondary | ICD-10-CM | POA: Diagnosis not present

## 2018-09-30 DIAGNOSIS — I2510 Atherosclerotic heart disease of native coronary artery without angina pectoris: Secondary | ICD-10-CM | POA: Diagnosis not present

## 2018-09-30 DIAGNOSIS — M545 Low back pain: Secondary | ICD-10-CM | POA: Diagnosis not present

## 2018-09-30 DIAGNOSIS — G9340 Encephalopathy, unspecified: Secondary | ICD-10-CM | POA: Diagnosis not present

## 2018-09-30 DIAGNOSIS — Z7401 Bed confinement status: Secondary | ICD-10-CM | POA: Diagnosis not present

## 2018-09-30 DIAGNOSIS — R278 Other lack of coordination: Secondary | ICD-10-CM | POA: Diagnosis not present

## 2018-09-30 DIAGNOSIS — K219 Gastro-esophageal reflux disease without esophagitis: Secondary | ICD-10-CM | POA: Diagnosis not present

## 2018-09-30 DIAGNOSIS — M2550 Pain in unspecified joint: Secondary | ICD-10-CM | POA: Diagnosis not present

## 2018-09-30 DIAGNOSIS — I10 Essential (primary) hypertension: Secondary | ICD-10-CM | POA: Diagnosis not present

## 2018-09-30 DIAGNOSIS — R627 Adult failure to thrive: Secondary | ICD-10-CM | POA: Diagnosis not present

## 2018-09-30 DIAGNOSIS — M170 Bilateral primary osteoarthritis of knee: Secondary | ICD-10-CM | POA: Diagnosis not present

## 2018-09-30 DIAGNOSIS — N390 Urinary tract infection, site not specified: Secondary | ICD-10-CM | POA: Diagnosis present

## 2018-09-30 DIAGNOSIS — M6281 Muscle weakness (generalized): Secondary | ICD-10-CM | POA: Diagnosis not present

## 2018-09-30 DIAGNOSIS — U071 COVID-19: Secondary | ICD-10-CM | POA: Diagnosis not present

## 2018-09-30 DIAGNOSIS — R4702 Dysphasia: Secondary | ICD-10-CM | POA: Diagnosis not present

## 2018-09-30 DIAGNOSIS — M4800 Spinal stenosis, site unspecified: Secondary | ICD-10-CM | POA: Diagnosis not present

## 2018-09-30 DIAGNOSIS — Z9181 History of falling: Secondary | ICD-10-CM | POA: Diagnosis not present

## 2018-09-30 DIAGNOSIS — Z87891 Personal history of nicotine dependence: Secondary | ICD-10-CM | POA: Diagnosis not present

## 2018-09-30 DIAGNOSIS — J309 Allergic rhinitis, unspecified: Secondary | ICD-10-CM | POA: Diagnosis not present

## 2018-09-30 DIAGNOSIS — R4182 Altered mental status, unspecified: Secondary | ICD-10-CM | POA: Diagnosis not present

## 2018-09-30 DIAGNOSIS — M549 Dorsalgia, unspecified: Secondary | ICD-10-CM | POA: Diagnosis not present

## 2018-09-30 DIAGNOSIS — M810 Age-related osteoporosis without current pathological fracture: Secondary | ICD-10-CM | POA: Diagnosis not present

## 2018-09-30 DIAGNOSIS — J449 Chronic obstructive pulmonary disease, unspecified: Secondary | ICD-10-CM | POA: Diagnosis not present

## 2018-09-30 DIAGNOSIS — K449 Diaphragmatic hernia without obstruction or gangrene: Secondary | ICD-10-CM | POA: Diagnosis not present

## 2018-09-30 LAB — URINE DRUGS OF ABUSE SCREEN W ALC, ROUTINE (REF LAB)
Amphetamines, Urine: NEGATIVE ng/mL
Barbiturate, Ur: NEGATIVE ng/mL
Benzodiazepine Quant, Ur: NEGATIVE ng/mL
Cannabinoid Quant, Ur: NEGATIVE ng/mL
Cocaine (Metab.): NEGATIVE ng/mL
Ethanol U, Quan: NEGATIVE %
Methadone Screen, Urine: NEGATIVE ng/mL
Opiate Quant, Ur: NEGATIVE ng/mL
Phencyclidine, Ur: NEGATIVE ng/mL
Propoxyphene, Urine: NEGATIVE ng/mL

## 2018-09-30 LAB — BASIC METABOLIC PANEL
Anion gap: 10 (ref 5–15)
BUN: 8 mg/dL (ref 8–23)
CO2: 22 mmol/L (ref 22–32)
Calcium: 8.7 mg/dL — ABNORMAL LOW (ref 8.9–10.3)
Chloride: 107 mmol/L (ref 98–111)
Creatinine, Ser: 0.73 mg/dL (ref 0.44–1.00)
GFR calc Af Amer: >60 mL/min (ref >60)
GFR calc non Af Amer: >60 mL/min (ref >60)
Glucose, Bld: 98 mg/dL (ref 70–99)
Potassium: 3.5 mmol/L (ref 3.5–5.1)
Sodium: 139 mmol/L (ref 135–145)

## 2018-09-30 LAB — CBC WITH DIFFERENTIAL/PLATELET
Abs Immature Granulocytes: 0.02 10*3/uL (ref 0.00–0.07)
Basophils Absolute: 0 10*3/uL (ref 0.0–0.1)
Basophils Relative: 0 %
Eosinophils Absolute: 0.2 10*3/uL (ref 0.0–0.5)
Eosinophils Relative: 5 %
HCT: 35.9 % — ABNORMAL LOW (ref 36.0–46.0)
Hemoglobin: 11.3 g/dL — ABNORMAL LOW (ref 12.0–15.0)
Immature Granulocytes: 1 %
Lymphocytes Relative: 30 %
Lymphs Abs: 1.3 10*3/uL (ref 0.7–4.0)
MCH: 27.4 pg (ref 26.0–34.0)
MCHC: 31.5 g/dL (ref 30.0–36.0)
MCV: 86.9 fL (ref 80.0–100.0)
Monocytes Absolute: 0.4 10*3/uL (ref 0.1–1.0)
Monocytes Relative: 10 %
Neutro Abs: 2.4 10*3/uL (ref 1.7–7.7)
Neutrophils Relative %: 54 %
Platelets: 194 10*3/uL (ref 150–400)
RBC: 4.13 MIL/uL (ref 3.87–5.11)
RDW: 15.3 % (ref 11.5–15.5)
WBC: 4.4 10*3/uL (ref 4.0–10.5)
nRBC: 0 % (ref 0.0–0.2)

## 2018-09-30 LAB — GLUCOSE, CAPILLARY: Glucose-Capillary: 124 mg/dL — ABNORMAL HIGH (ref 70–99)

## 2018-09-30 LAB — URINE CULTURE: Culture: >=100000 — AB

## 2018-09-30 MED ORDER — GABAPENTIN 100 MG PO CAPS: 100.0000 mg | ORAL_CAPSULE | Freq: Two times a day (BID) | ORAL | 0 refills | Status: DC

## 2018-09-30 MED ORDER — OXYBUTYNIN CHLORIDE ER 5 MG PO TB24: 5.0000 mg | ORAL_TABLET | Freq: Every day | ORAL | 0 refills | Status: AC

## 2018-09-30 MED ORDER — OMEPRAZOLE 40 MG PO CPDR: 40.0000 mg | DELAYED_RELEASE_CAPSULE | Freq: Every day | ORAL | 0 refills | Status: DC

## 2018-09-30 MED ORDER — TRAMADOL HCL 50 MG PO TABS: 50.0000 mg | ORAL_TABLET | Freq: Four times a day (QID) | ORAL | 0 refills | Status: AC | PRN

## 2018-09-30 MED ORDER — SENNOSIDES-DOCUSATE SODIUM 8.6-50 MG PO TABS: 2.0000 | ORAL_TABLET | Freq: Every day | ORAL | 0 refills | Status: AC | PRN

## 2018-09-30 MED ORDER — NITROFURANTOIN MONOHYD MACRO 100 MG PO CAPS: 100.0000 mg | ORAL_CAPSULE | Freq: Two times a day (BID) | ORAL | 0 refills | Status: AC

## 2018-09-30 MED ORDER — NITROFURANTOIN MONOHYD MACRO 100 MG PO CAPS
100.0000 mg | ORAL_CAPSULE | Freq: Two times a day (BID) | ORAL | Status: DC
  Filled 2018-09-30 (×2): qty 1

## 2018-09-30 NOTE — TOC Transition Note (Signed)
Transition of Care [facility]) - CM/SW Discharge Note   Patient Details  Name: Jennifer [name] MRN: [id number] Date of Birth: 08-03-1922  [facility] CM/SW Contact:  Alberteen Sam, LCSW Phone Number: 09/30/2018, 1:07 PM   Clinical Narrative:     Patient will DC to: [facility] Anticipated DC date: 09/30/2018 Family notified: Meredith Mody Transport QZ:[id number]  Per MD patient ready for DC to [facility] . RN, patient, patient's family, and facility notified of DC. Discharge Summary sent to facility. RN given number for report [phone]. DC packet on chart. Ambulance transport requested for patient.  CSW signing off.  [place], [place]   Final next level of care: Skilled Nursing Facility Barriers to Discharge: No Barriers Identified   Patient Goals and CMS Choice   CMS Medicare.gov Compare Post Acute Care list provided to:: Patient Represenative (must comment)(daughter [name]) Choice offered to / list presented to : Adult Children  Discharge Placement PASRR number recieved: 09/29/18            Patient chooses bed at: [facility] Patient to be transferred to facility by: [facility] Name of family member notified: [name] Patient and family notified of of transfer: 09/30/18  Discharge Plan and Services     Post Acute Care Choice: [facility]                               Social Determinants of Health (SDOH) Interventions     Readmission Risk Interventions No flowsheet data found.

## 2018-09-30 NOTE — Progress Notes (Signed)
Patient has order to discharge to SNF. Report given to [facility]. PTAR at bedside to transport. Daughter, Nicki Reaper notified and face timed so that she can see her mother before she leaves. All belongings packed.

## 2018-09-30 NOTE — Progress Notes (Signed)
Patient oxygen level recorded 84% using her finger. Rechecked using her earlobe for better perfusion. Oxygen Sats 99%. Patient is easily aroused and other VS WNL. Will continue to monitor for patient safety.

## 2018-09-30 NOTE — Progress Notes (Addendum)
OT Cancellation Note  Patient Details Name: Jennifer [name] MRN: [id number] DOB: 09-Jun-1922   Cancelled Treatment:    Reason Eval/Treat Not Completed: OT screened, no needs identified. Pt with eyes closed and not responding to verbal or physical stimuli. Pt from SNF, and per PT note pt requires hoyer lift for transfers and total A with ADLs. Pt likely close to baseline so will defer further OT needs to SNF. Will sign off. If needs change, please re-consult  Britt Bottom 09/30/2018, 11:34 AM

## 2018-09-30 NOTE — Discharge Summary (Signed)
Physician Discharge Summary  [name] FTD:[id number] DOB: [birth year] DOA: 09/28/2018  PCP: [name], MD  Admit date: 09/28/2018 Discharge date: 09/30/2018  Admitted From: Home Disposition:  [facility]  Recommendations for Outpatient Follow-up:  1. Follow up with PCP in 1-2 weeks 2. Please obtain BMP/CBC in one week 3. Please follow up on the following pending results:  Home Health: N/A Equipment/Devices: none  Discharge Condition: stable CODE STATUS: DNR Diet recommendation: Dysphagia   History of present illness:  Jennifer [name] a 85 y.o.femalewith medical history significant ofdementia, coronary artery disease, COPD, osteoarthritis, hyperlipidemia brought in by ambulance from her nursing facilityto emergency department for altered mental status and slurred speech. Patient's daughter Jennifer [name](cell phone #[phone]) reported that she spoke to her mom on Sunday and she was doing well, was talking and asking questions, however this morning staff noted that she is not like herself.Jennifer [name] also reports history of multiple urinary tract infection-recently treated with Bactrim. [name] not in favor of MRI as she thinksthat patient will not tolerate the MRI. In the ED, CT head: No acute findings. Patient was evaluated by neurology who recommended encephalopathy work-up, EEG to assess for seizures.  Patient admitted for further management.  Hospital course:  Acute metabolic encephalopathy Patient presenting from home with progressive confusion, questionable slurred speech and weakness.  Initially neurology was consulted for evaluation for possible stroke.  CT head was negative for any acute findings.  Per daughter, patient unlikely to tolerate MRI secondary to her advanced dementia.  Patient underwent EEG that was suggestive of mild diffuse encephalopathy without seizure or epileptic form discharges seen.  Etiology of her symptoms likely secondary to  infectious from a UTI with previous history of UTIs.  Patient was initially started on ceftriaxone for broad coverage.  Urine culture returned back ESBL E. coli with sensitivity to nitrofurantoin.  Patient's antibiotics were changed to Macrobid 100 mg p.o. twice daily to complete a 7-day course.  Patient appears to be back to her normal baseline, and will discharge to [facility].  ESBL E. coli UTI History of multiple UTIs in the past.  Was prescribed Bactrim outpatient, with minimal effect.  Urine culture positive for ESBL E. coli with sensitivities to gentamicin, imipenem, and nitrofurantoin.  Ceftriaxone was changed to Macrobid 100 mg p.o. twice daily on 09/30/2018 and will continue to complete a 7-day course.  Mild hyperkalemia: Resolved Likely from recent Bactrim use.  Potassium at time of discharge 3.5.  COPD Stable on room air  Dementia Continue supportive care  Chronic bilateral knee osteoarthritis Tylenol as needed.  Try to avoid tramadol as much as possible secondary to dementia and advanced age.  Discharge Diagnoses:  Principal Problem:   UTI due to extended-spectrum beta lactamase (ESBL) producing Escherichia coli Active Problems:   Coronary atherosclerosis   Osteoarthritis   COPD (chronic obstructive pulmonary disease) (HCC)   Dementia without behavioral disturbance (HCC)   Lower urinary tract infectious disease    Discharge Instructions  Discharge Instructions    Call MD for:  difficulty breathing, headache or visual disturbances   Complete by: As directed    Call MD for:  extreme fatigue   Complete by: As directed    Call MD for:  persistant dizziness or light-headedness   Complete by: As directed    Call MD for:  persistant nausea and vomiting   Complete by: As directed    Call MD for:  severe uncontrolled pain   Complete by: As directed  Call MD for:  temperature >100.4   Complete by: As directed    Diet - low sodium heart healthy   Complete by:  As directed    Increase activity slowly   Complete by: As directed      Allergies as of 09/30/2018      Reactions   Aricept [donepezil Hcl] Other (See Comments)   confusion   Iohexol     Desc: PT ALLERGIC TO CONTRAST- SHE CAN'T BREATH   Iron    Penicillins    Has patient had a PCN reaction causing immediate rash, facial/tongue/throat swelling, SOB or lightheadedness with hypotension:  unknown Has patient had a PCN reaction causing severe rash involving mucus membranes or skin necrosis: unknown Has patient had a PCN reaction that required hospitalization unknown Has patient had a PCN reaction occurring within the last 10 years: unknown If all of the above answers are "NO", then may proceed with Cephalosporin use.      Medication List    STOP taking these medications   feeding supplement (ENSURE ENLIVE) Liqd   multivitamin Liqd   sulfamethoxazole-trimethoprim 800-160 MG tablet Commonly known as: BACTRIM DS     TAKE these medications   acetaminophen 325 MG tablet Commonly known as: TYLENOL Take 650 mg by mouth 3 (three) times daily. What changed: Another medication with the same name was removed. Continue taking this medication, and follow the directions you see here.   diclofenac sodium 1 % Gel Commonly known as: VOLTAREN Apply to both knees QID PRN What changed:   how much to take  how to take this  when to take this  additional instructions   fluticasone 50 MCG/ACT nasal spray Commonly known as: FLONASE Place 1 spray into both nostrils daily for 30 days.   gabapentin 100 MG capsule Commonly known as: NEURONTIN Take 1 capsule (100 mg total) by mouth 2 (two) times daily.   geriatric multivitamins-minerals Liqd Take 15 mLs by mouth daily.   lidocaine 5 % Commonly known as: LIDODERM Place 1 patch onto the skin daily. Remove & Discard patch within 12 hours or as directed by MD   nitrofurantoin (macrocrystal-monohydrate) 100 MG capsule Commonly known as:  MACROBID Take 1 capsule (100 mg total) by mouth every 12 (twelve) hours for 7 days.   omeprazole 40 MG capsule Commonly known as: PRILOSEC Take 1 capsule (40 mg total) by mouth daily.   oxybutynin 5 MG 24 hr tablet Commonly known as: DITROPAN-XL Take 1 tablet (5 mg total) by mouth at bedtime.   senna-docusate 8.6-50 MG tablet Commonly known as: Senokot-S Take 2 tablets by mouth daily as needed for mild constipation.   Systane 0.4-0.3 % Soln Generic drug: Polyethyl Glycol-Propyl Glycol Apply 1 drop to eye every 6 (six) hours as needed (dry eyes).   traMADol 50 MG tablet Commonly known as: ULTRAM Take 1 tablet (50 mg total) by mouth every 6 (six) hours as needed for moderate pain or severe pain. What changed: Another medication with the same name was removed. Continue taking this medication, and follow the directions you see here.   zinc sulfate 220 (50 Zn) MG capsule Take 220 mg by mouth daily.      Follow-up Information    Fayrene Helper, MD. Call in 1 week(s).   Specialty: Family Medicine Contact information: [address] [place]  [address] [phone]          Allergies  Allergen Reactions  . Aricept [Donepezil Hcl] Other (See Comments)  confusion  . Iohexol      Desc: PT ALLERGIC TO CONTRAST- SHE CAN'T BREATH   . Iron   . Penicillins     Has patient had a PCN reaction causing immediate rash, facial/tongue/throat swelling, SOB or lightheadedness with hypotension:  unknown Has patient had a PCN reaction causing severe rash involving mucus membranes or skin necrosis: unknown Has patient had a PCN reaction that required hospitalization unknown Has patient had a PCN reaction occurring within the last 10 years: unknown If all of the above answers are "NO", then may proceed with Cephalosporin use.     Consultations:  Neurology   Procedures/Studies: Ct Head Wo Contrast  Result Date: 09/28/2018 CLINICAL DATA:  Unexplained altered  level of consciousness. EXAM: CT HEAD WITHOUT CONTRAST TECHNIQUE: Contiguous axial images were obtained from the base of the skull through the vertex without intravenous contrast. COMPARISON:  04/26/2018 FINDINGS: Brain: Today's study suffers from some motion degradation. Allowing for that, no change is appreciated. Age related atrophy. Chronic small-vessel ischemic changes of the cerebral hemispheric white matter. No sign of acute infarction, mass lesion, hemorrhage, hydrocephalus or extra-axial collection. Vascular: There is atherosclerotic calcification of the major vessels at the base of the brain. Skull: Negative Sinuses/Orbits: Clear/normal Other: None IMPRESSION: No acute finding. Motion degraded exam. Brain atrophy with some temporal predominance. Extensive chronic small-vessel ischemic changes of the white matter. Electronically Signed   By: Paulina FusiMark  Shogry M.D.   On: 09/28/2018 12:18   Dg Chest Port 1 View  Result Date: 09/28/2018 CLINICAL DATA:  Altered mental status EXAM: PORTABLE CHEST 1 VIEW COMPARISON:  04/24/2018 FINDINGS: Cardiac silhouette remains mildly enlarged. Aorta is calcified and tortuous. Low lung volumes. Chronic coarsened interstitial markings bilaterally. The previously seen right upper lobe opacity is no longer evident. No new focal airspace consolidation identified. No large pleural fluid collection. No pneumothorax. Colonic interposition under the right hemidiaphragm. Bones are demineralized. IMPRESSION: Expiratory chest radiograph with chronic bilateral lung changes. No definite superimposed opacity. Electronically Signed   By: Duanne GuessNicholas  Plundo M.D.   On: 09/28/2018 11:15     EEG 09/29/2018: IMPRESSION: This study is suggestive of mild diffuse encephalopathy. No seizures or epileptiform discharges were seen throughout the recording.  Subjective: Patient seen and examined at bedside, resting comfortably and sleeping but easily arousable.  Appears to be back at her normal  baseline.  Pleasantly confused.  Afebrile, no leukocytosis.  Urine culture returned back with E. coli with ESBL sensitive to nitrofurantoin, ceftriaxone changed to Macrobid today.  Ready for discharge to SNF.  No acute concerns per nursing staff today.   Discharge Exam: Vitals:   09/30/18 0623 09/30/18 0730  BP: 121/70 132/65  Pulse: (!) 52 64  Resp: 17 18  Temp: 98.2 F (36.8 C) 98 F (36.7 C)  SpO2: (!) 79% 99%   Vitals:   09/29/18 2008 09/30/18 0002 09/30/18 0623 09/30/18 0730  BP: (!) 168/83 (!) 154/75 121/70 132/65  Pulse: 85 75 (!) 52 64  Resp:  16 17 18   Temp:  97.7 F (36.5 C) 98.2 F (36.8 C) 98 F (36.7 C)  TempSrc:  Oral Oral Oral  SpO2: 100% 98% (!) 79% 99%  Weight:      Height:        General: Pt is alert, awake, not in acute distress, pleasantly confused Cardiovascular: RRR, S1/S2 +, no rubs, no gallops Respiratory: CTA bilaterally, no wheezing, no rhonchi Abdominal: Soft, NT, ND, bowel sounds + Extremities: no edema, no cyanosis  The results of significant diagnostics from this hospitalization (including imaging, microbiology, ancillary and laboratory) are listed below for reference.     Microbiology: Recent Results (from the past 240 hour(s))  SARS CORONAVIRUS 2 (TAT 6-24 HRS) Nasopharyngeal Nasopharyngeal Swab     Status: None   Collection Time: 09/28/18 12:29 PM   Specimen: Nasopharyngeal Swab  Result Value Ref Range Status   SARS Coronavirus 2 NEGATIVE NEGATIVE Final    Comment: (NOTE) SARS-CoV-2 target nucleic acids are NOT DETECTED. The SARS-CoV-2 RNA is generally detectable in upper and lower respiratory specimens during the acute phase of infection. Negative results do not preclude SARS-CoV-2 infection, do not rule out co-infections with other pathogens, and should not be used as the sole basis for treatment or other patient management decisions. Negative results must be combined with clinical observations, patient history, and  epidemiological information. The expected result is Negative. Fact Sheet for Patients: HairSlick.no Fact Sheet for Healthcare Providers: quierodirigir.com This test is not yet approved or cleared by the [place] FDA and  has been authorized for detection and/or diagnosis of SARS-CoV-2 by FDA under an Emergency Use Authorization (EUA). This EUA will remain  in effect (meaning this test can be used) for the duration of the COVID-19 declaration under Section 56 4(b)(1) of the Act, 21 U.S.C. section 360bbb-3(b)(1), unless the authorization is terminated or revoked sooner. Performed at [facility], [address] [place], [place] [address]   Urine culture     Status: Abnormal   Collection Time: 09/28/18  1:22 PM   Specimen: Urine, Random  Result Value Ref Range Status   Specimen Description URINE, RANDOM  Final   Special Requests   Final    NONE Performed at [facility], [address], [place], [place] [address]    Culture (A)  Final    >=100,000 COLONIES/mL ESCHERICHIA COLI Confirmed Extended Spectrum Beta-Lactamase Producer (ESBL).  In bloodstream infections from ESBL organisms, carbapenems are preferred over piperacillin/tazobactam. They are shown to have a lower risk of mortality.    Report Status 09/30/2018 FINAL  Final   Organism ID, Bacteria ESCHERICHIA COLI (A)  Final      Susceptibility   Escherichia coli - MIC*    AMPICILLIN >=32 RESISTANT Resistant     CEFAZOLIN >=64 RESISTANT Resistant     CEFTRIAXONE >=64 RESISTANT Resistant     CIPROFLOXACIN >=4 RESISTANT Resistant     GENTAMICIN <=1 SENSITIVE Sensitive     IMIPENEM <=0.25 SENSITIVE Sensitive     NITROFURANTOIN <=16 SENSITIVE Sensitive     TRIMETH/SULFA >=320 RESISTANT Resistant     AMPICILLIN/SULBACTAM >=32 RESISTANT Resistant     PIP/TAZO 64 INTERMEDIATE Intermediate     Extended ESBL POSITIVE Resistant     * >=100,000 COLONIES/mL  ESCHERICHIA COLI     Labs: BNP (last 3 results) Recent Labs    01/24/18 1440  BNP 249.0*   Basic Metabolic Panel: Recent Labs  Lab 09/28/18 1345 09/28/18 1823 09/29/18 0346 09/30/18 0554  NA 137  --  135 139  K 5.0  --  5.3* 3.5  CL 104  --  101 107  CO2 22  --  23 22  GLUCOSE 84  --  94 98  BUN 14  --  10 8  CREATININE 0.93 0.82 0.72 0.73  CALCIUM 8.7*  --  8.5* 8.7*  MG  --  1.8  --   --   PHOS  --  3.7  --   --  Liver Function Tests: Recent Labs  Lab 09/28/18 1345  AST 21  ALT 9  ALKPHOS 99  BILITOT 0.6  PROT 6.4*  ALBUMIN 2.7*   No results for input(s): LIPASE, AMYLASE in the last 168 hours. Recent Labs  Lab 09/28/18 1455  AMMONIA 19   CBC: Recent Labs  Lab 09/28/18 1345 09/28/18 1823 09/29/18 0346 09/30/18 0554  WBC 5.4 5.9 5.6 4.4  NEUTROABS 3.0  --   --  2.4  HGB 11.5* 11.1* 12.1 11.3*  HCT 39.2 37.9 38.6 35.9*  MCV 90.3 92.0 87.7 86.9  PLT 283 229 226 194   Cardiac Enzymes: No results for input(s): CKTOTAL, CKMB, CKMBINDEX, TROPONINI in the last 168 hours. BNP: Invalid input(s): POCBNP CBG: Recent Labs  Lab 09/28/18 2154 09/28/18 2228 09/30/18 1109  GLUCAP 65* 94 124*   D-Dimer No results for input(s): DDIMER in the last 72 hours. Hgb A1c No results for input(s): HGBA1C in the last 72 hours. Lipid Profile No results for input(s): CHOL, HDL, LDLCALC, TRIG, CHOLHDL, LDLDIRECT in the last 72 hours. Thyroid function studies Recent Labs    09/28/18 1823  TSH 1.471   Anemia work up No results for input(s): VITAMINB12, FOLATE, FERRITIN, TIBC, IRON, RETICCTPCT in the last 72 hours. Urinalysis    Component Value Date/Time   COLORURINE YELLOW 09/28/2018 1322   APPEARANCEUR CLOUDY (A) 09/28/2018 1322   LABSPEC 1.012 09/28/2018 1322   PHURINE 6.0 09/28/2018 1322   GLUCOSEU NEGATIVE 09/28/2018 1322   GLUCOSEU NEG mg/dL 16/10/9602 5409   HGBUR SMALL (A) 09/28/2018 1322   HGBUR negative 05/02/2008 1547   BILIRUBINUR NEGATIVE  09/28/2018 1322   BILIRUBINUR small 12/05/2012 1324   KETONESUR NEGATIVE 09/28/2018 1322   PROTEINUR NEGATIVE 09/28/2018 1322   UROBILINOGEN 1.0 12/05/2012 1324   UROBILINOGEN 0.2 05/02/2008 1547   NITRITE POSITIVE (A) 09/28/2018 1322   LEUKOCYTESUR LARGE (A) 09/28/2018 1322   Sepsis Labs Invalid input(s): PROCALCITONIN,  WBC,  LACTICIDVEN Microbiology Recent Results (from the past 240 hour(s))  SARS CORONAVIRUS 2 (TAT 6-24 HRS) Nasopharyngeal Nasopharyngeal Swab     Status: None   Collection Time: 09/28/18 12:29 PM   Specimen: Nasopharyngeal Swab  Result Value Ref Range Status   SARS Coronavirus 2 NEGATIVE NEGATIVE Final    Comment: (NOTE) SARS-CoV-2 target nucleic acids are NOT DETECTED. The SARS-CoV-2 RNA is generally detectable in upper and lower respiratory specimens during the acute phase of infection. Negative results do not preclude SARS-CoV-2 infection, do not rule out co-infections with other pathogens, and should not be used as the sole basis for treatment or other patient management decisions. Negative results must be combined with clinical observations, patient history, and epidemiological information. The expected result is Negative. Fact Sheet for Patients: HairSlick.no Fact Sheet for Healthcare Providers: quierodirigir.com This test is not yet approved or cleared by the [place] FDA and  has been authorized for detection and/or diagnosis of SARS-CoV-2 by FDA under an Emergency Use Authorization (EUA). This EUA will remain  in effect (meaning this test can be used) for the duration of the COVID-19 declaration under Section 56 4(b)(1) of the Act, 21 U.S.C. section 360bbb-3(b)(1), unless the authorization is terminated or revoked sooner. Performed at [facility], [address] [place], [place] [address]   Urine culture     Status: Abnormal   Collection Time: 09/28/18  1:22 PM   Specimen:  Urine, Random  Result Value Ref Range Status   Specimen Description URINE, RANDOM  Final  Special Requests   Final    NONE Performed at [facility], [address] [place], [place] [address]    Culture (A)  Final    >=100,000 COLONIES/mL ESCHERICHIA COLI Confirmed Extended Spectrum Beta-Lactamase Producer (ESBL).  In bloodstream infections from ESBL organisms, carbapenems are preferred over piperacillin/tazobactam. They are shown to have a lower risk of mortality.    Report Status 09/30/2018 FINAL  Final   Organism ID, Bacteria ESCHERICHIA COLI (A)  Final      Susceptibility   Escherichia coli - MIC*    AMPICILLIN >=32 RESISTANT Resistant     CEFAZOLIN >=64 RESISTANT Resistant     CEFTRIAXONE >=64 RESISTANT Resistant     CIPROFLOXACIN >=4 RESISTANT Resistant     GENTAMICIN <=1 SENSITIVE Sensitive     IMIPENEM <=0.25 SENSITIVE Sensitive     NITROFURANTOIN <=16 SENSITIVE Sensitive     TRIMETH/SULFA >=320 RESISTANT Resistant     AMPICILLIN/SULBACTAM >=32 RESISTANT Resistant     PIP/TAZO 64 INTERMEDIATE Intermediate     Extended ESBL POSITIVE Resistant     * >=100,000 COLONIES/mL ESCHERICHIA COLI     Time coordinating discharge: Over 30 minutes  SIGNED:   Alvira PhilipsEric J [place], DO  [facility] 09/30/2018, 12:54 PM

## 2018-10-04 NOTE — Telephone Encounter (Signed)
Direct contact made this morning, she reviewed recent hospitalizations, will sign up in my chart

## 2018-10-05 DIAGNOSIS — I10 Essential (primary) hypertension: Secondary | ICD-10-CM | POA: Diagnosis not present

## 2018-10-05 DIAGNOSIS — J449 Chronic obstructive pulmonary disease, unspecified: Secondary | ICD-10-CM | POA: Diagnosis not present

## 2018-10-05 DIAGNOSIS — G9340 Encephalopathy, unspecified: Secondary | ICD-10-CM | POA: Diagnosis not present

## 2018-10-05 DIAGNOSIS — N390 Urinary tract infection, site not specified: Secondary | ICD-10-CM | POA: Diagnosis not present

## 2018-10-06 DIAGNOSIS — J449 Chronic obstructive pulmonary disease, unspecified: Secondary | ICD-10-CM | POA: Diagnosis not present

## 2018-10-06 DIAGNOSIS — I10 Essential (primary) hypertension: Secondary | ICD-10-CM | POA: Diagnosis not present

## 2018-10-12 DIAGNOSIS — I10 Essential (primary) hypertension: Secondary | ICD-10-CM | POA: Diagnosis not present

## 2018-10-12 DIAGNOSIS — G9340 Encephalopathy, unspecified: Secondary | ICD-10-CM | POA: Diagnosis not present

## 2018-10-12 DIAGNOSIS — H6693 Otitis media, unspecified, bilateral: Secondary | ICD-10-CM | POA: Diagnosis not present

## 2018-10-12 DIAGNOSIS — N390 Urinary tract infection, site not specified: Secondary | ICD-10-CM | POA: Diagnosis not present

## 2018-11-02 DIAGNOSIS — J449 Chronic obstructive pulmonary disease, unspecified: Secondary | ICD-10-CM | POA: Diagnosis not present

## 2018-11-02 DIAGNOSIS — I10 Essential (primary) hypertension: Secondary | ICD-10-CM | POA: Diagnosis not present

## 2018-11-02 DIAGNOSIS — M545 Low back pain: Secondary | ICD-10-CM | POA: Diagnosis not present

## 2018-11-02 DIAGNOSIS — J309 Allergic rhinitis, unspecified: Secondary | ICD-10-CM | POA: Diagnosis not present

## 2018-11-03 DIAGNOSIS — I10 Essential (primary) hypertension: Secondary | ICD-10-CM | POA: Diagnosis not present

## 2018-11-03 DIAGNOSIS — J449 Chronic obstructive pulmonary disease, unspecified: Secondary | ICD-10-CM | POA: Diagnosis not present

## 2018-11-03 DIAGNOSIS — M1990 Unspecified osteoarthritis, unspecified site: Secondary | ICD-10-CM | POA: Diagnosis not present

## 2018-11-03 DIAGNOSIS — J309 Allergic rhinitis, unspecified: Secondary | ICD-10-CM | POA: Diagnosis not present

## 2018-11-04 DIAGNOSIS — J449 Chronic obstructive pulmonary disease, unspecified: Secondary | ICD-10-CM | POA: Diagnosis not present

## 2018-11-04 DIAGNOSIS — I10 Essential (primary) hypertension: Secondary | ICD-10-CM | POA: Diagnosis not present

## 2018-11-15 DIAGNOSIS — U071 COVID-19: Secondary | ICD-10-CM | POA: Diagnosis not present

## 2018-11-18 DIAGNOSIS — U071 COVID-19: Secondary | ICD-10-CM | POA: Diagnosis not present

## 2018-11-23 DIAGNOSIS — U071 COVID-19: Secondary | ICD-10-CM | POA: Diagnosis not present

## 2018-11-29 DIAGNOSIS — J449 Chronic obstructive pulmonary disease, unspecified: Secondary | ICD-10-CM | POA: Diagnosis not present

## 2018-11-29 DIAGNOSIS — I10 Essential (primary) hypertension: Secondary | ICD-10-CM | POA: Diagnosis not present

## 2018-11-30 DIAGNOSIS — M1990 Unspecified osteoarthritis, unspecified site: Secondary | ICD-10-CM | POA: Diagnosis not present

## 2018-11-30 DIAGNOSIS — J449 Chronic obstructive pulmonary disease, unspecified: Secondary | ICD-10-CM | POA: Diagnosis not present

## 2018-11-30 DIAGNOSIS — J309 Allergic rhinitis, unspecified: Secondary | ICD-10-CM | POA: Diagnosis not present

## 2018-11-30 DIAGNOSIS — F0390 Unspecified dementia without behavioral disturbance: Secondary | ICD-10-CM | POA: Diagnosis not present

## 2018-11-30 DIAGNOSIS — U071 COVID-19: Secondary | ICD-10-CM | POA: Diagnosis not present

## 2018-12-01 DIAGNOSIS — M545 Low back pain: Secondary | ICD-10-CM | POA: Diagnosis not present

## 2018-12-01 DIAGNOSIS — J309 Allergic rhinitis, unspecified: Secondary | ICD-10-CM | POA: Diagnosis not present

## 2018-12-01 DIAGNOSIS — J449 Chronic obstructive pulmonary disease, unspecified: Secondary | ICD-10-CM | POA: Diagnosis not present

## 2018-12-01 DIAGNOSIS — M1990 Unspecified osteoarthritis, unspecified site: Secondary | ICD-10-CM | POA: Diagnosis not present

## 2018-12-05 DIAGNOSIS — U071 COVID-19: Secondary | ICD-10-CM | POA: Diagnosis not present

## 2018-12-13 DIAGNOSIS — M810 Age-related osteoporosis without current pathological fracture: Secondary | ICD-10-CM | POA: Diagnosis not present

## 2018-12-13 DIAGNOSIS — Z87891 Personal history of nicotine dependence: Secondary | ICD-10-CM | POA: Diagnosis not present

## 2018-12-13 DIAGNOSIS — H02103 Unspecified ectropion of right eye, unspecified eyelid: Secondary | ICD-10-CM | POA: Diagnosis not present

## 2018-12-13 DIAGNOSIS — K449 Diaphragmatic hernia without obstruction or gangrene: Secondary | ICD-10-CM | POA: Diagnosis not present

## 2018-12-13 DIAGNOSIS — R634 Abnormal weight loss: Secondary | ICD-10-CM | POA: Diagnosis not present

## 2018-12-13 DIAGNOSIS — J449 Chronic obstructive pulmonary disease, unspecified: Secondary | ICD-10-CM | POA: Diagnosis not present

## 2018-12-13 DIAGNOSIS — G8929 Other chronic pain: Secondary | ICD-10-CM | POA: Diagnosis not present

## 2018-12-13 DIAGNOSIS — R278 Other lack of coordination: Secondary | ICD-10-CM | POA: Diagnosis not present

## 2018-12-13 DIAGNOSIS — E785 Hyperlipidemia, unspecified: Secondary | ICD-10-CM | POA: Diagnosis not present

## 2018-12-13 DIAGNOSIS — H02106 Unspecified ectropion of left eye, unspecified eyelid: Secondary | ICD-10-CM | POA: Diagnosis not present

## 2018-12-13 DIAGNOSIS — Z9181 History of falling: Secondary | ICD-10-CM | POA: Diagnosis not present

## 2018-12-13 DIAGNOSIS — Z23 Encounter for immunization: Secondary | ICD-10-CM | POA: Diagnosis not present

## 2018-12-13 DIAGNOSIS — M549 Dorsalgia, unspecified: Secondary | ICD-10-CM | POA: Diagnosis not present

## 2018-12-13 DIAGNOSIS — M1990 Unspecified osteoarthritis, unspecified site: Secondary | ICD-10-CM | POA: Diagnosis not present

## 2018-12-13 DIAGNOSIS — M170 Bilateral primary osteoarthritis of knee: Secondary | ICD-10-CM | POA: Diagnosis not present

## 2018-12-13 DIAGNOSIS — H04129 Dry eye syndrome of unspecified lacrimal gland: Secondary | ICD-10-CM | POA: Diagnosis not present

## 2018-12-13 DIAGNOSIS — M4800 Spinal stenosis, site unspecified: Secondary | ICD-10-CM | POA: Diagnosis not present

## 2018-12-13 DIAGNOSIS — R41841 Cognitive communication deficit: Secondary | ICD-10-CM | POA: Diagnosis not present

## 2018-12-13 DIAGNOSIS — U071 COVID-19: Secondary | ICD-10-CM | POA: Diagnosis not present

## 2018-12-13 DIAGNOSIS — R32 Unspecified urinary incontinence: Secondary | ICD-10-CM | POA: Diagnosis not present

## 2018-12-13 DIAGNOSIS — F0390 Unspecified dementia without behavioral disturbance: Secondary | ICD-10-CM | POA: Diagnosis not present

## 2018-12-13 DIAGNOSIS — R630 Anorexia: Secondary | ICD-10-CM | POA: Diagnosis not present

## 2018-12-13 DIAGNOSIS — M6281 Muscle weakness (generalized): Secondary | ICD-10-CM | POA: Diagnosis not present

## 2018-12-13 DIAGNOSIS — N390 Urinary tract infection, site not specified: Secondary | ICD-10-CM | POA: Diagnosis not present

## 2018-12-13 DIAGNOSIS — I10 Essential (primary) hypertension: Secondary | ICD-10-CM | POA: Diagnosis not present

## 2018-12-13 DIAGNOSIS — A0472 Enterocolitis due to Clostridium difficile, not specified as recurrent: Secondary | ICD-10-CM | POA: Diagnosis not present

## 2018-12-13 DIAGNOSIS — R4702 Dysphasia: Secondary | ICD-10-CM | POA: Diagnosis not present

## 2018-12-13 DIAGNOSIS — K219 Gastro-esophageal reflux disease without esophagitis: Secondary | ICD-10-CM | POA: Diagnosis not present

## 2018-12-13 DIAGNOSIS — D649 Anemia, unspecified: Secondary | ICD-10-CM | POA: Diagnosis not present

## 2018-12-13 DIAGNOSIS — J309 Allergic rhinitis, unspecified: Secondary | ICD-10-CM | POA: Diagnosis not present

## 2018-12-13 DIAGNOSIS — K5900 Constipation, unspecified: Secondary | ICD-10-CM | POA: Diagnosis not present

## 2018-12-13 DIAGNOSIS — I2510 Atherosclerotic heart disease of native coronary artery without angina pectoris: Secondary | ICD-10-CM | POA: Diagnosis not present

## 2018-12-13 DIAGNOSIS — R627 Adult failure to thrive: Secondary | ICD-10-CM | POA: Diagnosis not present

## 2018-12-13 DIAGNOSIS — R319 Hematuria, unspecified: Secondary | ICD-10-CM | POA: Diagnosis not present

## 2018-12-27 DIAGNOSIS — J449 Chronic obstructive pulmonary disease, unspecified: Secondary | ICD-10-CM | POA: Diagnosis not present

## 2018-12-27 DIAGNOSIS — I10 Essential (primary) hypertension: Secondary | ICD-10-CM | POA: Diagnosis not present

## 2019-01-16 DIAGNOSIS — U071 COVID-19: Secondary | ICD-10-CM | POA: Diagnosis not present

## 2019-01-19 DIAGNOSIS — Z23 Encounter for immunization: Secondary | ICD-10-CM | POA: Diagnosis not present

## 2019-01-20 DIAGNOSIS — U071 COVID-19: Secondary | ICD-10-CM | POA: Diagnosis not present

## 2019-01-24 DIAGNOSIS — U071 COVID-19: Secondary | ICD-10-CM | POA: Diagnosis not present

## 2019-01-25 DIAGNOSIS — J449 Chronic obstructive pulmonary disease, unspecified: Secondary | ICD-10-CM | POA: Diagnosis not present

## 2019-01-25 DIAGNOSIS — F0390 Unspecified dementia without behavioral disturbance: Secondary | ICD-10-CM | POA: Diagnosis not present

## 2019-01-25 DIAGNOSIS — M1990 Unspecified osteoarthritis, unspecified site: Secondary | ICD-10-CM | POA: Diagnosis not present

## 2019-01-25 DIAGNOSIS — J309 Allergic rhinitis, unspecified: Secondary | ICD-10-CM | POA: Diagnosis not present

## 2019-01-26 DIAGNOSIS — F0390 Unspecified dementia without behavioral disturbance: Secondary | ICD-10-CM | POA: Diagnosis not present

## 2019-01-26 DIAGNOSIS — M1990 Unspecified osteoarthritis, unspecified site: Secondary | ICD-10-CM | POA: Diagnosis not present

## 2019-01-26 DIAGNOSIS — J309 Allergic rhinitis, unspecified: Secondary | ICD-10-CM | POA: Diagnosis not present

## 2019-01-26 DIAGNOSIS — J449 Chronic obstructive pulmonary disease, unspecified: Secondary | ICD-10-CM | POA: Diagnosis not present

## 2019-01-30 DIAGNOSIS — U071 COVID-19: Secondary | ICD-10-CM | POA: Diagnosis not present

## 2019-02-01 DIAGNOSIS — I10 Essential (primary) hypertension: Secondary | ICD-10-CM | POA: Diagnosis not present

## 2019-02-01 DIAGNOSIS — J449 Chronic obstructive pulmonary disease, unspecified: Secondary | ICD-10-CM | POA: Diagnosis not present

## 2019-02-01 DIAGNOSIS — U071 COVID-19: Secondary | ICD-10-CM | POA: Diagnosis not present

## 2019-02-07 DIAGNOSIS — U071 COVID-19: Secondary | ICD-10-CM | POA: Diagnosis not present

## 2019-02-14 DIAGNOSIS — U071 COVID-19: Secondary | ICD-10-CM | POA: Diagnosis not present

## 2019-02-16 DIAGNOSIS — Z23 Encounter for immunization: Secondary | ICD-10-CM | POA: Diagnosis not present

## 2019-02-18 DIAGNOSIS — I10 Essential (primary) hypertension: Secondary | ICD-10-CM | POA: Diagnosis not present

## 2019-02-18 DIAGNOSIS — J449 Chronic obstructive pulmonary disease, unspecified: Secondary | ICD-10-CM | POA: Diagnosis not present

## 2019-02-22 DIAGNOSIS — J309 Allergic rhinitis, unspecified: Secondary | ICD-10-CM | POA: Diagnosis not present

## 2019-02-22 DIAGNOSIS — H02106 Unspecified ectropion of left eye, unspecified eyelid: Secondary | ICD-10-CM | POA: Diagnosis not present

## 2019-02-22 DIAGNOSIS — H02103 Unspecified ectropion of right eye, unspecified eyelid: Secondary | ICD-10-CM | POA: Diagnosis not present

## 2019-02-22 DIAGNOSIS — J449 Chronic obstructive pulmonary disease, unspecified: Secondary | ICD-10-CM | POA: Diagnosis not present

## 2019-02-23 DIAGNOSIS — F0390 Unspecified dementia without behavioral disturbance: Secondary | ICD-10-CM | POA: Diagnosis not present

## 2019-02-23 DIAGNOSIS — J309 Allergic rhinitis, unspecified: Secondary | ICD-10-CM | POA: Diagnosis not present

## 2019-02-23 DIAGNOSIS — M1990 Unspecified osteoarthritis, unspecified site: Secondary | ICD-10-CM | POA: Diagnosis not present

## 2019-02-23 DIAGNOSIS — J449 Chronic obstructive pulmonary disease, unspecified: Secondary | ICD-10-CM | POA: Diagnosis not present

## 2019-03-22 DIAGNOSIS — J449 Chronic obstructive pulmonary disease, unspecified: Secondary | ICD-10-CM | POA: Diagnosis not present

## 2019-03-22 DIAGNOSIS — J309 Allergic rhinitis, unspecified: Secondary | ICD-10-CM | POA: Diagnosis not present

## 2019-03-22 DIAGNOSIS — H04129 Dry eye syndrome of unspecified lacrimal gland: Secondary | ICD-10-CM | POA: Diagnosis not present

## 2019-03-22 DIAGNOSIS — H02106 Unspecified ectropion of left eye, unspecified eyelid: Secondary | ICD-10-CM | POA: Diagnosis not present

## 2019-03-23 DIAGNOSIS — F0390 Unspecified dementia without behavioral disturbance: Secondary | ICD-10-CM | POA: Diagnosis not present

## 2019-03-23 DIAGNOSIS — I2510 Atherosclerotic heart disease of native coronary artery without angina pectoris: Secondary | ICD-10-CM | POA: Diagnosis not present

## 2019-03-23 DIAGNOSIS — I10 Essential (primary) hypertension: Secondary | ICD-10-CM | POA: Diagnosis not present

## 2019-03-23 DIAGNOSIS — J449 Chronic obstructive pulmonary disease, unspecified: Secondary | ICD-10-CM | POA: Diagnosis not present

## 2019-03-30 DIAGNOSIS — J449 Chronic obstructive pulmonary disease, unspecified: Secondary | ICD-10-CM | POA: Diagnosis not present

## 2019-03-30 DIAGNOSIS — I10 Essential (primary) hypertension: Secondary | ICD-10-CM | POA: Diagnosis not present

## 2019-03-30 DIAGNOSIS — I2510 Atherosclerotic heart disease of native coronary artery without angina pectoris: Secondary | ICD-10-CM | POA: Diagnosis not present

## 2019-03-30 DIAGNOSIS — F0390 Unspecified dementia without behavioral disturbance: Secondary | ICD-10-CM | POA: Diagnosis not present

## 2019-04-04 DIAGNOSIS — I2510 Atherosclerotic heart disease of native coronary artery without angina pectoris: Secondary | ICD-10-CM | POA: Diagnosis not present

## 2019-04-04 DIAGNOSIS — J449 Chronic obstructive pulmonary disease, unspecified: Secondary | ICD-10-CM | POA: Diagnosis not present

## 2019-04-04 DIAGNOSIS — F0390 Unspecified dementia without behavioral disturbance: Secondary | ICD-10-CM | POA: Diagnosis not present

## 2019-04-04 DIAGNOSIS — I10 Essential (primary) hypertension: Secondary | ICD-10-CM | POA: Diagnosis not present

## 2019-04-26 DIAGNOSIS — J309 Allergic rhinitis, unspecified: Secondary | ICD-10-CM | POA: Diagnosis not present

## 2019-04-26 DIAGNOSIS — F0390 Unspecified dementia without behavioral disturbance: Secondary | ICD-10-CM | POA: Diagnosis not present

## 2019-04-26 DIAGNOSIS — J449 Chronic obstructive pulmonary disease, unspecified: Secondary | ICD-10-CM | POA: Diagnosis not present

## 2019-04-26 DIAGNOSIS — M1990 Unspecified osteoarthritis, unspecified site: Secondary | ICD-10-CM | POA: Diagnosis not present

## 2019-04-27 DIAGNOSIS — F0390 Unspecified dementia without behavioral disturbance: Secondary | ICD-10-CM | POA: Diagnosis not present

## 2019-04-27 DIAGNOSIS — M1990 Unspecified osteoarthritis, unspecified site: Secondary | ICD-10-CM | POA: Diagnosis not present

## 2019-04-27 DIAGNOSIS — J449 Chronic obstructive pulmonary disease, unspecified: Secondary | ICD-10-CM | POA: Diagnosis not present

## 2019-04-27 DIAGNOSIS — J309 Allergic rhinitis, unspecified: Secondary | ICD-10-CM | POA: Diagnosis not present

## 2019-05-05 DIAGNOSIS — F0390 Unspecified dementia without behavioral disturbance: Secondary | ICD-10-CM | POA: Diagnosis not present

## 2019-05-05 DIAGNOSIS — I10 Essential (primary) hypertension: Secondary | ICD-10-CM | POA: Diagnosis not present

## 2019-05-05 DIAGNOSIS — I2510 Atherosclerotic heart disease of native coronary artery without angina pectoris: Secondary | ICD-10-CM | POA: Diagnosis not present

## 2019-05-05 DIAGNOSIS — J449 Chronic obstructive pulmonary disease, unspecified: Secondary | ICD-10-CM | POA: Diagnosis not present

## 2019-05-24 DIAGNOSIS — J309 Allergic rhinitis, unspecified: Secondary | ICD-10-CM | POA: Diagnosis not present

## 2019-05-24 DIAGNOSIS — J449 Chronic obstructive pulmonary disease, unspecified: Secondary | ICD-10-CM | POA: Diagnosis not present

## 2019-05-24 DIAGNOSIS — F0390 Unspecified dementia without behavioral disturbance: Secondary | ICD-10-CM | POA: Diagnosis not present

## 2019-05-24 DIAGNOSIS — M1990 Unspecified osteoarthritis, unspecified site: Secondary | ICD-10-CM | POA: Diagnosis not present

## 2019-06-01 DIAGNOSIS — K219 Gastro-esophageal reflux disease without esophagitis: Secondary | ICD-10-CM | POA: Diagnosis not present

## 2019-06-01 DIAGNOSIS — I10 Essential (primary) hypertension: Secondary | ICD-10-CM | POA: Diagnosis not present

## 2019-06-01 DIAGNOSIS — I2510 Atherosclerotic heart disease of native coronary artery without angina pectoris: Secondary | ICD-10-CM | POA: Diagnosis not present

## 2019-06-01 DIAGNOSIS — M1990 Unspecified osteoarthritis, unspecified site: Secondary | ICD-10-CM | POA: Diagnosis not present

## 2019-06-13 DIAGNOSIS — D649 Anemia, unspecified: Secondary | ICD-10-CM | POA: Diagnosis not present

## 2019-06-13 DIAGNOSIS — R627 Adult failure to thrive: Secondary | ICD-10-CM | POA: Diagnosis not present

## 2019-06-13 DIAGNOSIS — R32 Unspecified urinary incontinence: Secondary | ICD-10-CM | POA: Diagnosis not present

## 2019-06-14 DIAGNOSIS — H109 Unspecified conjunctivitis: Secondary | ICD-10-CM | POA: Diagnosis not present

## 2019-06-14 DIAGNOSIS — J449 Chronic obstructive pulmonary disease, unspecified: Secondary | ICD-10-CM | POA: Diagnosis not present

## 2019-06-14 DIAGNOSIS — F0390 Unspecified dementia without behavioral disturbance: Secondary | ICD-10-CM | POA: Diagnosis not present

## 2019-06-14 DIAGNOSIS — R4182 Altered mental status, unspecified: Secondary | ICD-10-CM | POA: Diagnosis not present

## 2019-06-19 DIAGNOSIS — F0390 Unspecified dementia without behavioral disturbance: Secondary | ICD-10-CM | POA: Diagnosis not present

## 2019-06-19 DIAGNOSIS — R4182 Altered mental status, unspecified: Secondary | ICD-10-CM | POA: Diagnosis not present

## 2019-06-19 DIAGNOSIS — J449 Chronic obstructive pulmonary disease, unspecified: Secondary | ICD-10-CM | POA: Diagnosis not present

## 2019-06-19 DIAGNOSIS — H109 Unspecified conjunctivitis: Secondary | ICD-10-CM | POA: Diagnosis not present

## 2019-06-20 DIAGNOSIS — R627 Adult failure to thrive: Secondary | ICD-10-CM | POA: Diagnosis not present

## 2019-06-20 DIAGNOSIS — D649 Anemia, unspecified: Secondary | ICD-10-CM | POA: Diagnosis not present

## 2019-06-20 DIAGNOSIS — D519 Vitamin B12 deficiency anemia, unspecified: Secondary | ICD-10-CM | POA: Diagnosis not present

## 2019-06-22 DIAGNOSIS — I10 Essential (primary) hypertension: Secondary | ICD-10-CM | POA: Diagnosis not present

## 2019-06-22 DIAGNOSIS — M1990 Unspecified osteoarthritis, unspecified site: Secondary | ICD-10-CM | POA: Diagnosis not present

## 2019-06-22 DIAGNOSIS — F0390 Unspecified dementia without behavioral disturbance: Secondary | ICD-10-CM | POA: Diagnosis not present

## 2019-06-22 DIAGNOSIS — I2510 Atherosclerotic heart disease of native coronary artery without angina pectoris: Secondary | ICD-10-CM | POA: Diagnosis not present

## 2019-06-22 DIAGNOSIS — J449 Chronic obstructive pulmonary disease, unspecified: Secondary | ICD-10-CM | POA: Diagnosis not present

## 2019-06-26 DIAGNOSIS — I2510 Atherosclerotic heart disease of native coronary artery without angina pectoris: Secondary | ICD-10-CM | POA: Diagnosis not present

## 2019-06-26 DIAGNOSIS — D649 Anemia, unspecified: Secondary | ICD-10-CM | POA: Diagnosis not present

## 2019-07-13 DIAGNOSIS — M545 Low back pain: Secondary | ICD-10-CM | POA: Diagnosis not present

## 2019-07-13 DIAGNOSIS — B351 Tinea unguium: Secondary | ICD-10-CM | POA: Diagnosis not present

## 2019-07-13 DIAGNOSIS — K219 Gastro-esophageal reflux disease without esophagitis: Secondary | ICD-10-CM | POA: Diagnosis not present

## 2019-07-13 DIAGNOSIS — M1990 Unspecified osteoarthritis, unspecified site: Secondary | ICD-10-CM | POA: Diagnosis not present

## 2019-07-13 DIAGNOSIS — R627 Adult failure to thrive: Secondary | ICD-10-CM | POA: Diagnosis not present

## 2019-07-13 DIAGNOSIS — F0391 Unspecified dementia with behavioral disturbance: Secondary | ICD-10-CM | POA: Diagnosis not present

## 2019-07-13 DIAGNOSIS — M2042 Other hammer toe(s) (acquired), left foot: Secondary | ICD-10-CM | POA: Diagnosis not present

## 2019-07-13 DIAGNOSIS — M4800 Spinal stenosis, site unspecified: Secondary | ICD-10-CM | POA: Diagnosis not present

## 2019-07-13 DIAGNOSIS — Z9181 History of falling: Secondary | ICD-10-CM | POA: Diagnosis not present

## 2019-07-13 DIAGNOSIS — N390 Urinary tract infection, site not specified: Secondary | ICD-10-CM | POA: Diagnosis not present

## 2019-07-13 DIAGNOSIS — F0390 Unspecified dementia without behavioral disturbance: Secondary | ICD-10-CM | POA: Diagnosis not present

## 2019-07-13 DIAGNOSIS — E785 Hyperlipidemia, unspecified: Secondary | ICD-10-CM | POA: Diagnosis not present

## 2019-07-13 DIAGNOSIS — M170 Bilateral primary osteoarthritis of knee: Secondary | ICD-10-CM | POA: Diagnosis not present

## 2019-07-13 DIAGNOSIS — K449 Diaphragmatic hernia without obstruction or gangrene: Secondary | ICD-10-CM | POA: Diagnosis not present

## 2019-07-13 DIAGNOSIS — R278 Other lack of coordination: Secondary | ICD-10-CM | POA: Diagnosis not present

## 2019-07-13 DIAGNOSIS — M6281 Muscle weakness (generalized): Secondary | ICD-10-CM | POA: Diagnosis not present

## 2019-07-13 DIAGNOSIS — R630 Anorexia: Secondary | ICD-10-CM | POA: Diagnosis not present

## 2019-07-13 DIAGNOSIS — G8929 Other chronic pain: Secondary | ICD-10-CM | POA: Diagnosis not present

## 2019-07-13 DIAGNOSIS — J309 Allergic rhinitis, unspecified: Secondary | ICD-10-CM | POA: Diagnosis not present

## 2019-07-13 DIAGNOSIS — I2510 Atherosclerotic heart disease of native coronary artery without angina pectoris: Secondary | ICD-10-CM | POA: Diagnosis not present

## 2019-07-13 DIAGNOSIS — D649 Anemia, unspecified: Secondary | ICD-10-CM | POA: Diagnosis not present

## 2019-07-13 DIAGNOSIS — R4702 Dysphasia: Secondary | ICD-10-CM | POA: Diagnosis not present

## 2019-07-13 DIAGNOSIS — E039 Hypothyroidism, unspecified: Secondary | ICD-10-CM | POA: Diagnosis not present

## 2019-07-13 DIAGNOSIS — R293 Abnormal posture: Secondary | ICD-10-CM | POA: Diagnosis not present

## 2019-07-13 DIAGNOSIS — G894 Chronic pain syndrome: Secondary | ICD-10-CM | POA: Diagnosis not present

## 2019-07-13 DIAGNOSIS — R32 Unspecified urinary incontinence: Secondary | ICD-10-CM | POA: Diagnosis not present

## 2019-07-13 DIAGNOSIS — M810 Age-related osteoporosis without current pathological fracture: Secondary | ICD-10-CM | POA: Diagnosis not present

## 2019-07-13 DIAGNOSIS — H6123 Impacted cerumen, bilateral: Secondary | ICD-10-CM | POA: Diagnosis not present

## 2019-07-13 DIAGNOSIS — Z0000 Encounter for general adult medical examination without abnormal findings: Secondary | ICD-10-CM | POA: Diagnosis not present

## 2019-07-13 DIAGNOSIS — R4182 Altered mental status, unspecified: Secondary | ICD-10-CM | POA: Diagnosis not present

## 2019-07-13 DIAGNOSIS — K5900 Constipation, unspecified: Secondary | ICD-10-CM | POA: Diagnosis not present

## 2019-07-13 DIAGNOSIS — Z79899 Other long term (current) drug therapy: Secondary | ICD-10-CM | POA: Diagnosis not present

## 2019-07-13 DIAGNOSIS — N952 Postmenopausal atrophic vaginitis: Secondary | ICD-10-CM | POA: Diagnosis not present

## 2019-07-13 DIAGNOSIS — I10 Essential (primary) hypertension: Secondary | ICD-10-CM | POA: Diagnosis not present

## 2019-07-13 DIAGNOSIS — N3020 Other chronic cystitis without hematuria: Secondary | ICD-10-CM | POA: Diagnosis not present

## 2019-07-13 DIAGNOSIS — R41841 Cognitive communication deficit: Secondary | ICD-10-CM | POA: Diagnosis not present

## 2019-07-13 DIAGNOSIS — R1311 Dysphagia, oral phase: Secondary | ICD-10-CM | POA: Diagnosis not present

## 2019-07-13 DIAGNOSIS — L853 Xerosis cutis: Secondary | ICD-10-CM | POA: Diagnosis not present

## 2019-07-13 DIAGNOSIS — M549 Dorsalgia, unspecified: Secondary | ICD-10-CM | POA: Diagnosis not present

## 2019-07-13 DIAGNOSIS — N3281 Overactive bladder: Secondary | ICD-10-CM | POA: Diagnosis not present

## 2019-07-13 DIAGNOSIS — D508 Other iron deficiency anemias: Secondary | ICD-10-CM | POA: Diagnosis not present

## 2019-07-13 DIAGNOSIS — Z87891 Personal history of nicotine dependence: Secondary | ICD-10-CM | POA: Diagnosis not present

## 2019-07-13 DIAGNOSIS — J449 Chronic obstructive pulmonary disease, unspecified: Secondary | ICD-10-CM | POA: Diagnosis not present

## 2019-07-13 DIAGNOSIS — H04129 Dry eye syndrome of unspecified lacrimal gland: Secondary | ICD-10-CM | POA: Diagnosis not present

## 2019-08-06 DIAGNOSIS — M1990 Unspecified osteoarthritis, unspecified site: Secondary | ICD-10-CM | POA: Diagnosis not present

## 2019-08-06 DIAGNOSIS — I10 Essential (primary) hypertension: Secondary | ICD-10-CM | POA: Diagnosis not present

## 2019-08-06 DIAGNOSIS — J449 Chronic obstructive pulmonary disease, unspecified: Secondary | ICD-10-CM | POA: Diagnosis not present

## 2019-08-06 DIAGNOSIS — I2510 Atherosclerotic heart disease of native coronary artery without angina pectoris: Secondary | ICD-10-CM | POA: Diagnosis not present

## 2019-08-06 DIAGNOSIS — F0390 Unspecified dementia without behavioral disturbance: Secondary | ICD-10-CM | POA: Diagnosis not present

## 2019-08-16 DIAGNOSIS — R4182 Altered mental status, unspecified: Secondary | ICD-10-CM | POA: Diagnosis not present

## 2019-08-16 DIAGNOSIS — L853 Xerosis cutis: Secondary | ICD-10-CM | POA: Diagnosis not present

## 2019-08-16 DIAGNOSIS — J309 Allergic rhinitis, unspecified: Secondary | ICD-10-CM | POA: Diagnosis not present

## 2019-08-16 DIAGNOSIS — J449 Chronic obstructive pulmonary disease, unspecified: Secondary | ICD-10-CM | POA: Diagnosis not present

## 2019-08-16 DIAGNOSIS — B351 Tinea unguium: Secondary | ICD-10-CM | POA: Diagnosis not present

## 2019-08-16 DIAGNOSIS — F0390 Unspecified dementia without behavioral disturbance: Secondary | ICD-10-CM | POA: Diagnosis not present

## 2019-08-16 DIAGNOSIS — M2042 Other hammer toe(s) (acquired), left foot: Secondary | ICD-10-CM | POA: Diagnosis not present

## 2019-08-17 DIAGNOSIS — J309 Allergic rhinitis, unspecified: Secondary | ICD-10-CM | POA: Diagnosis not present

## 2019-08-17 DIAGNOSIS — H04129 Dry eye syndrome of unspecified lacrimal gland: Secondary | ICD-10-CM | POA: Diagnosis not present

## 2019-08-17 DIAGNOSIS — F0390 Unspecified dementia without behavioral disturbance: Secondary | ICD-10-CM | POA: Diagnosis not present

## 2019-08-17 DIAGNOSIS — I2510 Atherosclerotic heart disease of native coronary artery without angina pectoris: Secondary | ICD-10-CM | POA: Diagnosis not present

## 2019-08-17 DIAGNOSIS — J449 Chronic obstructive pulmonary disease, unspecified: Secondary | ICD-10-CM | POA: Diagnosis not present

## 2019-08-17 DIAGNOSIS — K219 Gastro-esophageal reflux disease without esophagitis: Secondary | ICD-10-CM | POA: Diagnosis not present

## 2019-08-23 DIAGNOSIS — M1990 Unspecified osteoarthritis, unspecified site: Secondary | ICD-10-CM | POA: Diagnosis not present

## 2019-08-23 DIAGNOSIS — I10 Essential (primary) hypertension: Secondary | ICD-10-CM | POA: Diagnosis not present

## 2019-08-23 DIAGNOSIS — I2510 Atherosclerotic heart disease of native coronary artery without angina pectoris: Secondary | ICD-10-CM | POA: Diagnosis not present

## 2019-08-23 DIAGNOSIS — F0390 Unspecified dementia without behavioral disturbance: Secondary | ICD-10-CM | POA: Diagnosis not present

## 2019-08-23 DIAGNOSIS — J449 Chronic obstructive pulmonary disease, unspecified: Secondary | ICD-10-CM | POA: Diagnosis not present

## 2019-09-11 DIAGNOSIS — J449 Chronic obstructive pulmonary disease, unspecified: Secondary | ICD-10-CM | POA: Diagnosis not present

## 2019-09-11 DIAGNOSIS — F0390 Unspecified dementia without behavioral disturbance: Secondary | ICD-10-CM | POA: Diagnosis not present

## 2019-09-11 DIAGNOSIS — M1990 Unspecified osteoarthritis, unspecified site: Secondary | ICD-10-CM | POA: Diagnosis not present

## 2019-09-11 DIAGNOSIS — R4182 Altered mental status, unspecified: Secondary | ICD-10-CM | POA: Diagnosis not present

## 2019-09-11 DIAGNOSIS — J309 Allergic rhinitis, unspecified: Secondary | ICD-10-CM | POA: Diagnosis not present

## 2019-09-14 DIAGNOSIS — J449 Chronic obstructive pulmonary disease, unspecified: Secondary | ICD-10-CM | POA: Diagnosis not present

## 2019-09-14 DIAGNOSIS — Z0000 Encounter for general adult medical examination without abnormal findings: Secondary | ICD-10-CM | POA: Diagnosis not present

## 2019-09-14 DIAGNOSIS — F0390 Unspecified dementia without behavioral disturbance: Secondary | ICD-10-CM | POA: Diagnosis not present

## 2019-09-14 DIAGNOSIS — R4182 Altered mental status, unspecified: Secondary | ICD-10-CM | POA: Diagnosis not present

## 2019-09-22 DIAGNOSIS — F0390 Unspecified dementia without behavioral disturbance: Secondary | ICD-10-CM | POA: Diagnosis not present

## 2019-09-22 DIAGNOSIS — I10 Essential (primary) hypertension: Secondary | ICD-10-CM | POA: Diagnosis not present

## 2019-09-22 DIAGNOSIS — I2510 Atherosclerotic heart disease of native coronary artery without angina pectoris: Secondary | ICD-10-CM | POA: Diagnosis not present

## 2019-09-22 DIAGNOSIS — J449 Chronic obstructive pulmonary disease, unspecified: Secondary | ICD-10-CM | POA: Diagnosis not present

## 2019-09-22 DIAGNOSIS — M1990 Unspecified osteoarthritis, unspecified site: Secondary | ICD-10-CM | POA: Diagnosis not present

## 2019-10-09 DIAGNOSIS — M1990 Unspecified osteoarthritis, unspecified site: Secondary | ICD-10-CM | POA: Diagnosis not present

## 2019-10-09 DIAGNOSIS — J309 Allergic rhinitis, unspecified: Secondary | ICD-10-CM | POA: Diagnosis not present

## 2019-10-09 DIAGNOSIS — J449 Chronic obstructive pulmonary disease, unspecified: Secondary | ICD-10-CM | POA: Diagnosis not present

## 2019-10-09 DIAGNOSIS — N3281 Overactive bladder: Secondary | ICD-10-CM | POA: Diagnosis not present

## 2019-10-09 DIAGNOSIS — M545 Low back pain: Secondary | ICD-10-CM | POA: Diagnosis not present

## 2019-10-12 DIAGNOSIS — J449 Chronic obstructive pulmonary disease, unspecified: Secondary | ICD-10-CM | POA: Diagnosis not present

## 2019-10-12 DIAGNOSIS — M1990 Unspecified osteoarthritis, unspecified site: Secondary | ICD-10-CM | POA: Diagnosis not present

## 2019-10-12 DIAGNOSIS — J309 Allergic rhinitis, unspecified: Secondary | ICD-10-CM | POA: Diagnosis not present

## 2019-10-12 DIAGNOSIS — F0390 Unspecified dementia without behavioral disturbance: Secondary | ICD-10-CM | POA: Diagnosis not present

## 2019-11-06 DIAGNOSIS — J309 Allergic rhinitis, unspecified: Secondary | ICD-10-CM | POA: Diagnosis not present

## 2019-11-06 DIAGNOSIS — F0390 Unspecified dementia without behavioral disturbance: Secondary | ICD-10-CM | POA: Diagnosis not present

## 2019-11-06 DIAGNOSIS — M1990 Unspecified osteoarthritis, unspecified site: Secondary | ICD-10-CM | POA: Diagnosis not present

## 2019-11-06 DIAGNOSIS — N3281 Overactive bladder: Secondary | ICD-10-CM | POA: Diagnosis not present

## 2019-11-06 DIAGNOSIS — J449 Chronic obstructive pulmonary disease, unspecified: Secondary | ICD-10-CM | POA: Diagnosis not present

## 2019-11-10 DIAGNOSIS — I2510 Atherosclerotic heart disease of native coronary artery without angina pectoris: Secondary | ICD-10-CM | POA: Diagnosis not present

## 2019-11-10 DIAGNOSIS — I10 Essential (primary) hypertension: Secondary | ICD-10-CM | POA: Diagnosis not present

## 2019-11-10 DIAGNOSIS — F0390 Unspecified dementia without behavioral disturbance: Secondary | ICD-10-CM | POA: Diagnosis not present

## 2019-11-10 DIAGNOSIS — J449 Chronic obstructive pulmonary disease, unspecified: Secondary | ICD-10-CM | POA: Diagnosis not present

## 2019-11-10 DIAGNOSIS — M1990 Unspecified osteoarthritis, unspecified site: Secondary | ICD-10-CM | POA: Diagnosis not present

## 2019-11-20 DIAGNOSIS — I2510 Atherosclerotic heart disease of native coronary artery without angina pectoris: Secondary | ICD-10-CM | POA: Diagnosis not present

## 2019-11-20 DIAGNOSIS — F0390 Unspecified dementia without behavioral disturbance: Secondary | ICD-10-CM | POA: Diagnosis not present

## 2019-11-20 DIAGNOSIS — I10 Essential (primary) hypertension: Secondary | ICD-10-CM | POA: Diagnosis not present

## 2019-11-20 DIAGNOSIS — J449 Chronic obstructive pulmonary disease, unspecified: Secondary | ICD-10-CM | POA: Diagnosis not present

## 2019-11-20 DIAGNOSIS — M1990 Unspecified osteoarthritis, unspecified site: Secondary | ICD-10-CM | POA: Diagnosis not present

## 2019-12-04 DIAGNOSIS — M1990 Unspecified osteoarthritis, unspecified site: Secondary | ICD-10-CM | POA: Diagnosis not present

## 2019-12-04 DIAGNOSIS — J449 Chronic obstructive pulmonary disease, unspecified: Secondary | ICD-10-CM | POA: Diagnosis not present

## 2019-12-04 DIAGNOSIS — F0390 Unspecified dementia without behavioral disturbance: Secondary | ICD-10-CM | POA: Diagnosis not present

## 2019-12-04 DIAGNOSIS — J309 Allergic rhinitis, unspecified: Secondary | ICD-10-CM | POA: Diagnosis not present

## 2019-12-13 DIAGNOSIS — D649 Anemia, unspecified: Secondary | ICD-10-CM | POA: Diagnosis not present

## 2019-12-13 DIAGNOSIS — F0390 Unspecified dementia without behavioral disturbance: Secondary | ICD-10-CM | POA: Diagnosis not present

## 2019-12-13 DIAGNOSIS — H6123 Impacted cerumen, bilateral: Secondary | ICD-10-CM | POA: Diagnosis not present

## 2019-12-13 DIAGNOSIS — J309 Allergic rhinitis, unspecified: Secondary | ICD-10-CM | POA: Diagnosis not present

## 2019-12-13 DIAGNOSIS — N390 Urinary tract infection, site not specified: Secondary | ICD-10-CM | POA: Diagnosis not present

## 2019-12-14 DIAGNOSIS — R627 Adult failure to thrive: Secondary | ICD-10-CM | POA: Diagnosis not present

## 2019-12-18 DIAGNOSIS — N952 Postmenopausal atrophic vaginitis: Secondary | ICD-10-CM | POA: Diagnosis not present

## 2019-12-18 DIAGNOSIS — N3020 Other chronic cystitis without hematuria: Secondary | ICD-10-CM | POA: Diagnosis not present

## 2019-12-19 DIAGNOSIS — J449 Chronic obstructive pulmonary disease, unspecified: Secondary | ICD-10-CM | POA: Diagnosis not present

## 2019-12-19 DIAGNOSIS — F0390 Unspecified dementia without behavioral disturbance: Secondary | ICD-10-CM | POA: Diagnosis not present

## 2019-12-19 DIAGNOSIS — I10 Essential (primary) hypertension: Secondary | ICD-10-CM | POA: Diagnosis not present

## 2019-12-19 DIAGNOSIS — I2510 Atherosclerotic heart disease of native coronary artery without angina pectoris: Secondary | ICD-10-CM | POA: Diagnosis not present

## 2019-12-19 DIAGNOSIS — F0391 Unspecified dementia with behavioral disturbance: Secondary | ICD-10-CM | POA: Diagnosis not present

## 2019-12-19 DIAGNOSIS — M1990 Unspecified osteoarthritis, unspecified site: Secondary | ICD-10-CM | POA: Diagnosis not present

## 2020-01-04 DIAGNOSIS — J449 Chronic obstructive pulmonary disease, unspecified: Secondary | ICD-10-CM | POA: Diagnosis not present

## 2020-01-04 DIAGNOSIS — K219 Gastro-esophageal reflux disease without esophagitis: Secondary | ICD-10-CM | POA: Diagnosis not present

## 2020-01-04 DIAGNOSIS — F0391 Unspecified dementia with behavioral disturbance: Secondary | ICD-10-CM | POA: Diagnosis not present

## 2020-01-04 DIAGNOSIS — B351 Tinea unguium: Secondary | ICD-10-CM | POA: Diagnosis not present

## 2020-01-04 DIAGNOSIS — I2510 Atherosclerotic heart disease of native coronary artery without angina pectoris: Secondary | ICD-10-CM | POA: Diagnosis not present

## 2020-01-10 DIAGNOSIS — G894 Chronic pain syndrome: Secondary | ICD-10-CM | POA: Diagnosis not present

## 2020-01-10 DIAGNOSIS — K219 Gastro-esophageal reflux disease without esophagitis: Secondary | ICD-10-CM | POA: Diagnosis not present

## 2020-01-10 DIAGNOSIS — H04129 Dry eye syndrome of unspecified lacrimal gland: Secondary | ICD-10-CM | POA: Diagnosis not present

## 2020-01-10 DIAGNOSIS — M1990 Unspecified osteoarthritis, unspecified site: Secondary | ICD-10-CM | POA: Diagnosis not present

## 2020-01-10 DIAGNOSIS — N390 Urinary tract infection, site not specified: Secondary | ICD-10-CM | POA: Diagnosis not present

## 2020-01-10 DIAGNOSIS — N3281 Overactive bladder: Secondary | ICD-10-CM | POA: Diagnosis not present

## 2020-01-10 DIAGNOSIS — J449 Chronic obstructive pulmonary disease, unspecified: Secondary | ICD-10-CM | POA: Diagnosis not present

## 2020-01-10 DIAGNOSIS — J309 Allergic rhinitis, unspecified: Secondary | ICD-10-CM | POA: Diagnosis not present

## 2020-11-23 ENCOUNTER — Emergency Department (HOSPITAL_COMMUNITY): Payer: Medicare Other

## 2020-11-23 ENCOUNTER — Encounter (HOSPITAL_COMMUNITY): Payer: Self-pay | Admitting: Internal Medicine

## 2020-11-23 ENCOUNTER — Inpatient Hospital Stay (HOSPITAL_COMMUNITY)
Admission: EM | Admit: 2020-11-23 | Discharge: 2020-11-26 | DRG: 177 | Disposition: A | Discharge status: Skilled Nursing Facility | Payer: Medicare Other | Source: Skilled Nursing Facility | Attending: Internal Medicine | Admitting: Internal Medicine

## 2020-11-23 DIAGNOSIS — N390 Urinary tract infection, site not specified: Secondary | ICD-10-CM | POA: Diagnosis present

## 2020-11-23 DIAGNOSIS — G9340 Encephalopathy, unspecified: Secondary | ICD-10-CM | POA: Diagnosis not present

## 2020-11-23 DIAGNOSIS — Z803 Family history of malignant neoplasm of breast: Secondary | ICD-10-CM

## 2020-11-23 DIAGNOSIS — Z79899 Other long term (current) drug therapy: Secondary | ICD-10-CM

## 2020-11-23 DIAGNOSIS — N3000 Acute cystitis without hematuria: Secondary | ICD-10-CM

## 2020-11-23 DIAGNOSIS — Z6823 Body mass index (BMI) 23.0-23.9, adult: Secondary | ICD-10-CM

## 2020-11-23 DIAGNOSIS — U071 COVID-19: Secondary | ICD-10-CM | POA: Diagnosis not present

## 2020-11-23 DIAGNOSIS — R532 Functional quadriplegia: Secondary | ICD-10-CM | POA: Diagnosis present

## 2020-11-23 DIAGNOSIS — Z8042 Family history of malignant neoplasm of prostate: Secondary | ICD-10-CM

## 2020-11-23 DIAGNOSIS — G8929 Other chronic pain: Secondary | ICD-10-CM | POA: Diagnosis present

## 2020-11-23 DIAGNOSIS — Z7401 Bed confinement status: Secondary | ICD-10-CM

## 2020-11-23 DIAGNOSIS — E785 Hyperlipidemia, unspecified: Secondary | ICD-10-CM | POA: Diagnosis present

## 2020-11-23 DIAGNOSIS — Z888 Allergy status to other drugs, medicaments and biological substances status: Secondary | ICD-10-CM

## 2020-11-23 DIAGNOSIS — G9341 Metabolic encephalopathy: Secondary | ICD-10-CM | POA: Diagnosis present

## 2020-11-23 DIAGNOSIS — R197 Diarrhea, unspecified: Secondary | ICD-10-CM | POA: Diagnosis present

## 2020-11-23 DIAGNOSIS — Z880 Allergy status to penicillin: Secondary | ICD-10-CM

## 2020-11-23 DIAGNOSIS — Z96651 Presence of right artificial knee joint: Secondary | ICD-10-CM | POA: Diagnosis present

## 2020-11-23 DIAGNOSIS — E860 Dehydration: Secondary | ICD-10-CM | POA: Diagnosis present

## 2020-11-23 DIAGNOSIS — I10 Essential (primary) hypertension: Secondary | ICD-10-CM | POA: Diagnosis present

## 2020-11-23 DIAGNOSIS — R627 Adult failure to thrive: Secondary | ICD-10-CM | POA: Diagnosis present

## 2020-11-23 DIAGNOSIS — R410 Disorientation, unspecified: Secondary | ICD-10-CM | POA: Diagnosis not present

## 2020-11-23 DIAGNOSIS — Z90710 Acquired absence of both cervix and uterus: Secondary | ICD-10-CM

## 2020-11-23 DIAGNOSIS — Z993 Dependence on wheelchair: Secondary | ICD-10-CM

## 2020-11-23 DIAGNOSIS — K219 Gastro-esophageal reflux disease without esophagitis: Secondary | ICD-10-CM | POA: Diagnosis present

## 2020-11-23 DIAGNOSIS — F0390 Unspecified dementia without behavioral disturbance: Secondary | ICD-10-CM | POA: Diagnosis present

## 2020-11-23 DIAGNOSIS — J449 Chronic obstructive pulmonary disease, unspecified: Secondary | ICD-10-CM | POA: Diagnosis not present

## 2020-11-23 DIAGNOSIS — I2510 Atherosclerotic heart disease of native coronary artery without angina pectoris: Secondary | ICD-10-CM | POA: Diagnosis present

## 2020-11-23 DIAGNOSIS — M549 Dorsalgia, unspecified: Secondary | ICD-10-CM | POA: Diagnosis present

## 2020-11-23 DIAGNOSIS — Z66 Do not resuscitate: Secondary | ICD-10-CM | POA: Diagnosis present

## 2020-11-23 DIAGNOSIS — Z87891 Personal history of nicotine dependence: Secondary | ICD-10-CM

## 2020-11-23 DIAGNOSIS — Z8249 Family history of ischemic heart disease and other diseases of the circulatory system: Secondary | ICD-10-CM

## 2020-11-23 DIAGNOSIS — Z9013 Acquired absence of bilateral breasts and nipples: Secondary | ICD-10-CM

## 2020-11-23 LAB — CBC WITH DIFFERENTIAL/PLATELET
Abs Immature Granulocytes: 0.01 10*3/uL (ref 0.00–0.07)
Basophils Absolute: 0 10*3/uL (ref 0.0–0.1)
Basophils Relative: 0 %
Eosinophils Absolute: 0.2 10*3/uL (ref 0.0–0.5)
Eosinophils Relative: 3 %
HCT: 39.5 % (ref 36.0–46.0)
Hemoglobin: 11.9 g/dL — ABNORMAL LOW (ref 12.0–15.0)
Immature Granulocytes: 0 %
Lymphocytes Relative: 42 %
Lymphs Abs: 2.4 10*3/uL (ref 0.7–4.0)
MCH: 28.2 pg (ref 26.0–34.0)
MCHC: 30.1 g/dL (ref 30.0–36.0)
MCV: 93.6 fL (ref 80.0–100.0)
Monocytes Absolute: 0.5 10*3/uL (ref 0.1–1.0)
Monocytes Relative: 9 %
Neutro Abs: 2.6 10*3/uL (ref 1.7–7.7)
Neutrophils Relative %: 46 %
Platelets: 177 10*3/uL (ref 150–400)
RBC: 4.22 MIL/uL (ref 3.87–5.11)
RDW: 16.4 % — ABNORMAL HIGH (ref 11.5–15.5)
WBC: 5.7 10*3/uL (ref 4.0–10.5)
nRBC: 0 % (ref 0.0–0.2)

## 2020-11-23 LAB — COMPREHENSIVE METABOLIC PANEL
ALT: 9 U/L (ref 0–44)
AST: 23 U/L (ref 15–41)
Albumin: 2.7 g/dL — ABNORMAL LOW (ref 3.5–5.0)
Alkaline Phosphatase: 114 U/L (ref 38–126)
Anion gap: 12 (ref 5–15)
BUN: 14 mg/dL (ref 8–23)
CO2: 20 mmol/L — ABNORMAL LOW (ref 22–32)
Calcium: 8.3 mg/dL — ABNORMAL LOW (ref 8.9–10.3)
Chloride: 108 mmol/L (ref 98–111)
Creatinine, Ser: 0.59 mg/dL (ref 0.44–1.00)
GFR, Estimated: >60 mL/min (ref >60)
Glucose, Bld: 77 mg/dL (ref 70–99)
Potassium: 4.2 mmol/L (ref 3.5–5.1)
Sodium: 140 mmol/L (ref 135–145)
Total Bilirubin: 0.8 mg/dL (ref 0.3–1.2)
Total Protein: 6 g/dL — ABNORMAL LOW (ref 6.5–8.1)

## 2020-11-23 LAB — URINALYSIS, ROUTINE W REFLEX MICROSCOPIC
Bilirubin Urine: NEGATIVE
Glucose, UA: NEGATIVE mg/dL
Ketones, ur: 5 mg/dL — AB
Nitrite: POSITIVE — AB
Protein, ur: 100 mg/dL — AB
Specific Gravity, Urine: 1.017 (ref 1.005–1.030)
WBC, UA: >50 WBC/hpf — ABNORMAL HIGH (ref 0–5)
pH: 7 (ref 5.0–8.0)

## 2020-11-23 LAB — MAGNESIUM: Magnesium: 1.9 mg/dL (ref 1.7–2.4)

## 2020-11-23 LAB — BASIC METABOLIC PANEL
Anion gap: 8 (ref 5–15)
BUN: 15 mg/dL (ref 8–23)
CO2: 28 mmol/L (ref 22–32)
Calcium: 8.5 mg/dL — ABNORMAL LOW (ref 8.9–10.3)
Chloride: 106 mmol/L (ref 98–111)
Creatinine, Ser: 0.65 mg/dL (ref 0.44–1.00)
GFR, Estimated: >60 mL/min (ref >60)
Glucose, Bld: 85 mg/dL (ref 70–99)
Potassium: 4.1 mmol/L (ref 3.5–5.1)
Sodium: 142 mmol/L (ref 135–145)

## 2020-11-23 LAB — LACTATE DEHYDROGENASE: LDH: 222 U/L — ABNORMAL HIGH (ref 98–192)

## 2020-11-23 LAB — BRAIN NATRIURETIC PEPTIDE: B Natriuretic Peptide: 585.7 pg/mL — ABNORMAL HIGH (ref 0.0–100.0)

## 2020-11-23 LAB — PHOSPHORUS: Phosphorus: 3.2 mg/dL (ref 2.5–4.6)

## 2020-11-23 LAB — MRSA NEXT GEN BY PCR, NASAL: MRSA by PCR Next Gen: NOT DETECTED

## 2020-11-23 LAB — TROPONIN I (HIGH SENSITIVITY): Troponin I (High Sensitivity): 27 ng/L — ABNORMAL HIGH (ref <18)

## 2020-11-23 LAB — CK: Total CK: 28 U/L — ABNORMAL LOW (ref 38–234)

## 2020-11-23 LAB — RESP PANEL BY RT-PCR (FLU A&B, COVID) ARPGX2
Influenza A by PCR: NEGATIVE
Influenza B by PCR: NEGATIVE
SARS Coronavirus 2 by RT PCR: POSITIVE — AB

## 2020-11-23 MED ORDER — SODIUM CHLORIDE 0.9 % IV BOLUS
1000.0000 mL | Freq: Once | INTRAVENOUS | Status: AC
  Administered 2020-11-23: 1000 mL via INTRAVENOUS

## 2020-11-23 MED ORDER — SODIUM CHLORIDE 0.9 % IV SOLN
1.0000 g | INTRAVENOUS | Status: DC
  Administered 2020-11-24 – 2020-11-25 (×2): 1 g via INTRAVENOUS
  Filled 2020-11-23 (×3): qty 10

## 2020-11-23 MED ORDER — SODIUM CHLORIDE 0.9 % IV SOLN
1.0000 g | Freq: Once | INTRAVENOUS | Status: AC
  Administered 2020-11-23: 1 g via INTRAVENOUS
  Filled 2020-11-23: qty 10

## 2020-11-23 MED ORDER — NIRMATRELVIR/RITONAVIR (PAXLOVID)TABLET
3.0000 | ORAL_TABLET | Freq: Two times a day (BID) | ORAL | Status: DC
  Administered 2020-11-23 – 2020-11-26 (×7): 3 via ORAL
  Filled 2020-11-23 (×2): qty 30

## 2020-11-23 MED ORDER — TRAMADOL HCL 50 MG PO TABS
50.0000 mg | ORAL_TABLET | Freq: Once | ORAL | Status: AC
  Administered 2020-11-23: 50 mg via ORAL
  Filled 2020-11-23: qty 1

## 2020-11-23 MED ORDER — NIRMATRELVIR/RITONAVIR (PAXLOVID)TABLET: 3.0000 | ORAL_TABLET | Freq: Two times a day (BID) | ORAL | Status: DC

## 2020-11-23 NOTE — H&P (Signed)
Jennifer [name]:[id number] DOB: 10/17/1922 DOA: 11/23/2020     PCP: Oneita Hurt, No   Outpatient Specialists:     Urology Dr.  PAce  Patient arrived to ER on 11/23/20 at 1152 Referred by Attending Charlynne Pander, MD   Patient coming from:   From facility [facility]  Chief Complaint: confusion   HPI: Jennifer [name] is a 87 y.o. female with medical history significant of dementia, CAD,COPD, HLD     Presented with   confusion worse than baseline her roommate tested positive for COVID No fever no N/v/D Decreased PO intake Prioor hx of URI causing delirium Family also endorsing some diarrhea today only    Has  been vaccinated against COVID  and boosted   Initial COVID  POSITIVE,     Lab Results  Component Value Date   SARSCOV2NAA POSITIVE (A) 11/23/2020   SARSCOV2NAA NEGATIVE 09/28/2018     Regarding pertinent Chronic problems:     Hyperlipidemia -  not on statins Lipid Panel     Component Value Date/Time   CHOL 169 03/20/2015 1046   TRIG 121 03/20/2015 1046   HDL 55 03/20/2015 1046   CHOLHDL 3.1 03/20/2015 1046   VLDL 24 03/20/2015 1046   LDLCALC 90 03/20/2015 1046      COPD - not  followed by pulmonology   not  on baseline oxygen     Dementia -  stable   While in ER: CT head neg, tested positive for COVID  Started on Paxlovid and Rocephin  In ER had one reading of pulse ox below 90 but it thought to be positional   ED Triage Vitals  Enc Vitals Group     BP 11/23/20 1256 (!) 153/135     Pulse Rate 11/23/20 1256 66     Resp 11/23/20 1256 (!) 24     Temp 11/23/20 1256 (!) 97.2 F (36.2 C)     Temp src --      SpO2 11/23/20 1256 99 %     Weight 11/23/20 1800 177 lb 14.6 oz (80.7 kg)     Height 11/23/20 1800 5\' 5"  (1.651 m)     Head Circumference --      Peak Flow --      Pain Score --      Pain Loc --      Pain Edu? --      Excl. in GC? --   TMAX(24)@     _________________________________________ Significant initial   Findings: Abnormal Labs Reviewed  RESP PANEL BY RT-PCR (FLU A&B, COVID) ARPGX2 - Abnormal; Notable for the following components:      Result Value   SARS Coronavirus 2 by RT PCR POSITIVE (*)    All other components within normal limits  BASIC METABOLIC PANEL - Abnormal; Notable for the following components:   Calcium 8.5 (*)    All other components within normal limits  CBC WITH DIFFERENTIAL/PLATELET - Abnormal; Notable for the following components:   Hemoglobin 11.9 (*)    RDW 16.4 (*)    All other components within normal limits  BRAIN NATRIURETIC PEPTIDE - Abnormal; Notable for the following components:   B Natriuretic Peptide 585.7 (*)    All other components within normal limits  URINALYSIS, ROUTINE W REFLEX MICROSCOPIC - Abnormal; Notable for the following components:   Color, Urine AMBER (*)    APPearance TURBID (*)    Hgb urine dipstick MODERATE (*)    Ketones, ur 5 (*)  Protein, ur 100 (*)    Nitrite POSITIVE (*)    Leukocytes,Ua TRACE (*)    WBC, UA >50 (*)    Bacteria, UA FEW (*)    All other components within normal limits  CK - Abnormal; Notable for the following components:   Total CK 28 (*)    All other components within normal limits   ____________________________________________ Ordered CT HEAD   NON acute  CXR -  NON acute    ECG: Ordered Personally reviewed by me showing: HR : 60 Rhythm: SR, poor baseline  nonspecific changes,  [id number]   The recent clinical data is shown below. Vitals:   11/23/20 1500 11/23/20 1647 11/23/20 1700 11/23/20 1800  BP: 121/74 (!) 124/95    Pulse: (!) 148 66 63   Resp: 20  14   Temp:      SpO2: (!) 88% 97% 98%   Weight:    80.7 kg  Height:     (1.651 m)    WBC     Component Value Date/Time   WBC 5.7 11/23/2020 1301   LYMPHSABS 2.4 11/23/2020 1301   MONOABS 0.5 11/23/2020 1301   EOSABS 0.2 11/23/2020 1301   BASOSABS 0.0 11/23/2020 1301      Procalcitonin   Ordered   UA  evidence of UTI      Urine  analysis:    Component Value Date/Time   COLORURINE AMBER (A) 11/23/2020 1301   APPEARANCEUR TURBID (A) 11/23/2020 1301   LABSPEC 1.017 11/23/2020 1301   PHURINE 7.0 11/23/2020 1301   GLUCOSEU NEGATIVE 11/23/2020 1301   GLUCOSEU NEG mg/dL 16/10/9602 5409   HGBUR MODERATE (A) 11/23/2020 1301   HGBUR negative 05/02/2008 1547   BILIRUBINUR NEGATIVE 11/23/2020 1301   BILIRUBINUR small 12/05/2012 1324   KETONESUR 5 (A) 11/23/2020 1301   PROTEINUR 100 (A) 11/23/2020 1301   UROBILINOGEN 1.0 12/05/2012 1324   UROBILINOGEN 0.2 05/02/2008 1547   NITRITE POSITIVE (A) 11/23/2020 1301   LEUKOCYTESUR TRACE (A) 11/23/2020 1301    Results for orders placed or performed during the hospital encounter of 11/23/20  Resp Panel by RT-PCR (Flu A&B, Covid) Nasopharyngeal Swab     Status: Abnormal   Collection Time: 11/23/20  1:09 PM   Specimen: Nasopharyngeal Swab; Nasopharyngeal(NP) swabs in vial transport medium  Result Value Ref Range Status   SARS Coronavirus 2 by RT PCR POSITIVE (A) NEGATIVE Final         Influenza A by PCR NEGATIVE NEGATIVE Final   Influenza B by PCR NEGATIVE NEGATIVE Final           _______________________________________________ Hospitalist was called for admission for UTI and acute encephalopathy with incidental COVID infection  The following Work up has been ordered so far:  Orders Placed This Encounter  Procedures   Resp Panel by RT-PCR (Flu A&B, Covid) Nasopharyngeal Swab   Urine Culture   DG Chest Port 1 View   CT HEAD WO CONTRAST ( )   Basic metabolic panel   CBC with Differential   Brain natriuretic peptide   Urinalysis, Routine w reflex microscopic   CK   Cardiac monitoring   In and Out Cath   Do not attempt resuscitation/DNR   Consult to hospitalist   Consult for [facility] Admission   Pulse oximetry (single)   Oxygen therapy Liters Per Minute: 2   ED EKG   EKG 12-Lead   ED EKG   Insert peripheral IV    Following Medications were ordered  in  ER: Medications  cefTRIAXone (ROCEPHIN) 1 g in sodium chloride 0.9 % 100 mL IVPB (1 g Intravenous New Bag/Given 11/23/20 1836)  nirmatrelvir/ritonavir EUA (PAXLOVID) 3 tablet (has no administration in time range)  sodium chloride 0.9 % bolus 1,000 mL (0 mLs Intravenous Stopped 11/23/20 1700)  traMADol (ULTRAM) tablet 50 mg (50 mg Oral Given 11/23/20 1837)        Consult Orders  (From admission, onward)           Start     Ordered   11/23/20 1751  Consult to hospitalist  Once       Provider:  (Not yet assigned)  Question Answer Comment  Place call to: [facility]   Reason for Consult Admit      11/23/20 1750              OTHER Significant initial  Findings:  labs showing:    Recent Labs  Lab 11/23/20 1301  NA 142  K 4.1  CO2 28  GLUCOSE 85  BUN 15  CREATININE 0.65  CALCIUM 8.5*    Cr    stable,    Lab Results  Component Value Date   CREATININE 0.65 11/23/2020   CREATININE 0.73 09/30/2018   CREATININE 0.72 09/29/2018    No results for input(s): AST, ALT, ALKPHOS, BILITOT, PROT, ALBUMIN in the last 168 hours. Lab Results  Component Value Date   CALCIUM 8.5 (L) 11/23/2020   PHOS 3.7 09/28/2018        Plt: Lab Results  Component Value Date   PLT 177 11/23/2020    Recent Labs  Lab 11/23/20 1301  WBC 5.7  NEUTROABS 2.6  HGB 11.9*  HCT 39.5  MCV 93.6  PLT 177    HG/HCT   stable,      Component Value Date/Time   HGB 11.9 (L) 11/23/2020 1301   HCT 39.5 11/23/2020 1301   HCT REJ5 04/27/2018 1326   MCV 93.6 11/23/2020 1301      Cardiac Panel (last 3 results) Recent Labs    11/23/20 1301  CKTOTAL 28*     BNP (last 3 results) Recent Labs    11/23/20 1301  BNP 585.7*    Cultures:    Component Value Date/Time   SDES URINE, RANDOM 09/28/2018 1322   SPECREQUEST  09/28/2018 1322    NONE Performed at [facility], [address], [place], [place] [address]    CULT (A) 09/28/2018 1322    >=100,000 COLONIES/mL  ESCHERICHIA COLI Confirmed Extended Spectrum Beta-Lactamase Producer (ESBL).  In bloodstream infections from ESBL organisms, carbapenems are preferred over piperacillin/tazobactam. They are shown to have a lower risk of mortality.    REPTSTATUS 09/30/2018 FINAL 09/28/2018 1322     Radiological Exams on Admission: CT HEAD WO CONTRAST ( )  Result Date: 11/23/2020 CLINICAL DATA:  Delirium.  Patient has history of dementia. EXAM: CT HEAD WITHOUT CONTRAST TECHNIQUE: Contiguous axial images were obtained from the base of the skull through the vertex without intravenous contrast. COMPARISON:  September 28, 2018 FINDINGS: Brain: No evidence of acute infarction, hemorrhage, hydrocephalus, extra-axial collection or mass lesion/mass effect. There is chronic diffuse atrophy. Chronic bilateral periventricular white matter small vessel ischemic changes are noted. Vascular: No hyperdense vessel is noted. Skull: Normal. Negative for fracture or focal lesion. Sinuses/Orbits: No acute finding. Other: None. IMPRESSION: 1. No focal acute intracranial abnormality identified. 2. Chronic diffuse atrophy. Chronic bilateral periventricular white matter small vessel ischemic change. Electronically Signed   By: Scherry Ran  Juel Burrow M.D.   On: 11/23/2020 14:12   DG Chest Port 1 View  Result Date: 11/23/2020 CLINICAL DATA:  Altered mental status and chest pain. EXAM: PORTABLE CHEST 1 VIEW COMPARISON:  September 28, 2018 FINDINGS: The heart size and mediastinal contours are stable. Both lungs are clear. Calcified granulomas are identified in the right lung unchanged. Chronic elevation of right hemidiaphragm is unchanged. The visualized skeletal structures are stable. IMPRESSION: No active disease. Electronically Signed   By: Sherian Rein M.D.   On: 11/23/2020 13:27   _______________________________________________________________________________________________________ Latest  Blood pressure (!) 124/95, pulse 63, temperature (!)  97.2 F (36.2 C), resp. rate 14, height  (1.651 m), weight 80.7 kg, SpO2 98 %.   Review of Systems:    Pertinent positives include:  diarrhea,  confusion chills, fatigue, Constitutional:  No weight loss, night sweats, Fevers,  weight loss  HEENT:  No headaches, Difficulty swallowing,Tooth/dental problems,Sore throat,  No sneezing, itching, ear ache, nasal congestion, post nasal drip,  Cardio-vascular:  No chest pain, Orthopnea, PND, anasarca, dizziness, palpitations.no Bilateral lower extremity swelling  GI:  No heartburn, indigestion, abdominal pain, nausea, vomiting, change in bowel habits, loss of appetite, melena, blood in stool, hematemesis Resp:  no shortness of breath at rest. No dyspnea on exertion, No excess mucus, no productive cough, No non-productive cough, No coughing up of blood.No change in color of mucus.No wheezing. Skin:  no rash or lesions. No jaundice GU:  no dysuria, change in color of urine, no urgency or frequency. No straining to urinate.  No flank pain.  Musculoskeletal:  No joint pain or no joint swelling. No decreased range of motion. No back pain.  Psych:  No change in mood or affect. No depression or anxiety. No memory loss.  Neuro: no localizing neurological complaints, no tingling, no weakness, no double vision, no gait abnormality, no slurred speech,    All systems reviewed and apart from HOPI all are negative _______________________________________________________________________________________________ Past Medical History:   Past Medical History:  Diagnosis Date   Anemia    iron deficiency post op.   CAD (coronary artery disease) 2008   Cath 40% mid LAD, 35% RCA   Cellulitis of finger    right    Chest pain 2006   Chronic back pain    COPD (chronic obstructive pulmonary disease) (HCC)    Gastroesophageal reflux disease    Hiatal hernia    Hyperlipidemia    Memory deficit    Nicotine addiction    Osteoarthritis    status post  left TKA; surgery on the right is anticipated in the near future    Pulmonary nodules    stable since 2006   Shingles    right breast    Skin infection    Ulcer    gential   Use of cane as ambulatory aid      Past Surgical History:  Procedure Laterality Date   ABDOMINAL HYSTERECTOMY     BREAST SURGERY N/A    bilateral   CARDIAC CATHETERIZATION  2008   Mid LAD 40%, RCA 25%.    CHOLECYSTECTOMY     COMBINED HYSTERECTOMY ABDOMINAL W/ A&P REPAIR / OOPHORECTOMY  approx. 40 years ago    EYE SURGERY     MASTECTOMY  1998 & 2007    right -1998 / left 2007   right knee replacement  09/2009   Dr. Romeo Apple   TOTAL KNEE ARTHROPLASTY  4/08   left     Social History:  Ambulatory  wheelchair bound      reports that she quit smoking about 5 years ago. Her smoking use included cigarettes. She has a 17.50 pack-year smoking history. She has never used smokeless tobacco. She reports that she does not drink alcohol and does not use drugs.     Family History:   Family History  Problem Relation Age of Onset   Cancer Mother 27       breast    Cancer Father 29       prostate   Hypertension Father    Hypertension Sister    Heart disease Sister    Heart disease Sister    Heart disease Sister    ______________________________________________________________________________________________ Allergies: Allergies  Allergen Reactions   Aricept [Donepezil Hcl] Other (See Comments)    confusion   Iohexol      Desc: PT ALLERGIC TO CONTRAST- SHE CAN'T BREATH    Iron    Penicillins     Has patient had a PCN reaction causing immediate rash, facial/tongue/throat swelling, SOB or lightheadedness with hypotension:  unknown Has patient had a PCN reaction causing severe rash involving mucus membranes or skin necrosis: unknown Has patient had a PCN reaction that required hospitalization unknown Has patient had a PCN reaction occurring within the last 10 years: unknown If all of the above  answers are "NO", then may proceed with Cephalosporin use.      Prior to Admission medications   Medication Sig Start Date End Date Taking? Authorizing Provider  acetaminophen (TYLENOL) 325 MG tablet Take 650 mg by mouth 3 (three) times daily.    [provider]  diclofenac sodium (VOLTAREN) 1 % GEL Apply to both knees QID PRN Patient taking differently: Apply 4 g topically 4 (four) times daily. Apply to both knees 04/29/18   Vassie Loll, MD  fluticasone [facility]) 50 MCG/ACT nasal spray Place 1 spray into both nostrils daily for 30 days. 01/29/18 04/26/18  Sherryll Burger, Pratik D, DO  gabapentin (NEURONTIN) 100 MG capsule Take 1 capsule (100 mg total) by mouth 2 (two) times daily. 09/30/18 10/30/18  [place], Eric J, DO  geriatric multivitamins-minerals (ELDERTONIC/GEVRABON) LIQD Take 15 mLs by mouth daily.    [provider]  lidocaine (LIDODERM) 5 % Place 1 patch onto the skin daily. Remove & Discard patch within 12 hours or as directed by MD 03/02/18   [name], MD  omeprazole (PRILOSEC) 40 MG capsule Take 1 capsule (40 mg total) by mouth daily. 09/30/18   [place], Alvira Philips, DO  Polyethyl Glycol-Propyl Glycol (SYSTANE) 0.4-0.3 % SOLN Apply 1 drop to eye every 6 (six) hours as needed (dry eyes).     [provider]  zinc sulfate 220 (50 Zn) MG capsule Take 220 mg by mouth daily.    [provider]    ___________________________________________________________________________________________________ Physical Exam: Vitals with BMI 11/23/2020 11/23/2020 11/23/2020  Height 5\' 5"  - -  Weight 177 lbs 15 oz - -  BMI 29.61 - -  Systolic - - 124  Diastolic - - 95  Pulse - 63 66     1. General:  in No  Acute distress   Chronically ill    -appearing 2. Psychological: somnolent not Oriented 3. Head/ENT:    Dry Mucous Membranes                          Head Non traumatic, neck supple  Poor Dentition 4. SKIN:  decreased Skin turgor,  Skin  clean Dry and intact no rash 5. Heart: Regular rate and rhythm systolic Murmur, no Rub or gallop 6. Lungs:  no wheezes or crackles   7. Abdomen: Soft,  non-tender, Non distended   obese  bowel sounds present 8. Lower extremities: no clubbing, cyanosis, no  edema 9. Neurologically Grossly intact, moving all 4 extremities equally   10. MSK: Normal range of motion    Chart has been reviewed  ______________________________________________________________________________________________  Assessment/Plan  87 y.o. female with medical history significant of dementia, CAD,COPD, HLD     Admitted for  UTI and acute encephalopathy with incidental COVID infection  Present on Admission:  Acute encephalopathy - most likely due to infection and dehydration Will treat UTI Gently rehydrate    UTI (urinary tract infection) -  treat with Rocephin         await results of urine culture and adjust antibiotic coverage as needed   HTN (hypertension) - allow permissive HTN    Dementia without behavioral disturbance (HCC) - will expect some sundowning   COPD (chronic obstructive pulmonary disease) (HCC)- chronic stable   COVID-19 virus infection - no evidence of hypoxia or pulmonary infiltrates Started on paxlovid  Other plan as per orders.  DVT prophylaxis:  Lovenox       Code Status:    Code Status: DNR   as per family  I had personally discussed CODE STATUS with family    Family Communication:   Family at  Bedside  plan of care was discussed   with  Daughter   Disposition Plan:                            Back to current facility when stable                               Following barriers for discharge:                                                        able to transition to PO antibiotics                             Will need to be able to tolerate PO                                       Would benefit from PT/OT eval prior to DC  Ordered                                       Transition of care consulted                     Consults called: none  Admission status:  ED Disposition     ED Disposition  Admit   Condition  --   Comment  Hospital Area: [facility] [[id number]]  Level of Care: Telemetry Medical [104]  May place patient in observation at  [place] or [facility] if equivalent level of care is available:: No  Covid Evaluation: Confirmed COVID Positive  Diagnosis: Acute encephalopathy [161096]  Admitting Physician: Therisa Doyne [[id number]]  Attending Physician: Therisa Doyne [[id number]]          Obs      Level of care     progressive tele indefinitely please discontinue once patient no longer qualifies COVID-19 Labs   Lab Results  Component Value Date   SARSCOV2NAA POSITIVE (A) 11/23/2020     Precautions: admitted as  covid positive Airborne and Contact precautions     PPE: Used by the provider:   N95  eye Goggles,  Gloves      Xzander Gilham 11/23/2020, 8:07 PM    [facility]     after 2 AM please page floor coverage PA If 7AM-7PM, please contact the day team taking care of the patient using [email or web address]   Patient was evaluated in the context of the global COVID-19 pandemic, which necessitated consideration that the patient might be at risk for infection with the SARS-CoV-2 virus that causes COVID-19. Institutional protocols and algorithms that pertain to the evaluation of patients at risk for COVID-19 are in a state of rapid change based on information released by regulatory bodies including the CDC and federal and state organizations. These policies and algorithms were followed during the patient's care.

## 2020-11-23 NOTE — ED Provider Notes (Signed)
  Physical Exam  BP (!) 124/95   Pulse 63   Temp (!) 97.2 F (36.2 C)   Resp 14   SpO2 98%   Physical Exam  ED Course/Procedures     Procedures  MDM  Care assumed at 3 PM.  Patient is here with confusion and tremors.  CT head and chest x-ray unremarkable.  CBC and BMP at baseline.  Signout pending urinalysis and COVID test.   6:08 PM Patient's UA showed UTI.  Patient is COVID-positive.  Per the daughter, the patient next-door to her in the nursing home is also COVID-positive.  Patient has no respiratory complaints.  Patient's confusion can be from COVID and UTI.  Given antibiotics for UTI.  I ordered  Paxlovid for COVID.  She has no oxygen requirement currently.       Charlynne Pander, MD 11/23/20 [phone]

## 2020-11-23 NOTE — ED Notes (Signed)
Pt in and out cath'd per order. Brief noted to be soiled w/ urine. Pt cleansed of urine and stool prior to cath. Cath completed and fresh brief placed on pt. Purewick also in place for urinary incontinence.

## 2020-11-23 NOTE — ED Triage Notes (Addendum)
Patient arrived from [facility] with dementia and having tremor with mouth. Staff at the facility have no idea of when this started.  CBG 112. Patient received 500NS prior to arrival Patient denies pain, alert to baseline

## 2020-11-23 NOTE — ED Provider Notes (Signed)
[facility] EMERGENCY DEPARTMENT Provider Note   CSN: [id number] Arrival date & time: 11/23/20  1152     History No chief complaint on file.   Jennifer [name] is a 87 y.o. female.  This is a 87 y.o. female with significant medical history as below, including dementia, nursing home resident, DNR who presents to the ED with complaint of possible tremors.   Level 5 caveat, dementia  History provided by daughter [name].  Report that over the past 2 to 3 months patient has been eating significantly less than her baseline, refusing meals, becoming less interactive.  Periodically she will have episodes where she has a a facial tremor, increased sleepiness.  This has occurred sporadically over the past 12 months. Patient has very limited verbal responsiveness per daughter. Activity is severely limited at baseline and is typically bedbound. No other reported changes per the daughter, no fevers, chills, nausea, vomiting, change to urination or bowel function, no rashes or falls reported. Daughter reports last time this happened she was diagnosed with covid 19.   DNR, daughter requesting labs, ivf, antibiotics, imaging as needed.   Similar presentation 9/20 with admission, no epileptiform changes on EEG. Unable to tolerate MRI. Delirium attributed to UTI at that time.   The history is provided by a relative. The history is limited by the condition of the patient. No language interpreter was used.      Past Medical History:  Diagnosis Date  . Anemia    iron deficiency post op.  Marland Kitchen CAD (coronary artery disease) 2008   Cath 40% mid LAD, 35% RCA  . Cellulitis of finger    right   . Chest pain 2006  . Chronic back pain   . COPD (chronic obstructive pulmonary disease) (HCC)   . Gastroesophageal reflux disease   . Hiatal hernia   . Hyperlipidemia   . Memory deficit   . Nicotine addiction   . Osteoarthritis    status post left TKA; surgery on the right is anticipated in  the near future   . Pulmonary nodules    stable since 2006  . Shingles    right breast   . Skin infection   . Ulcer    gential  . Use of cane as ambulatory aid     Patient Active Problem List   Diagnosis Date Noted  . UTI due to extended-spectrum beta lactamase (ESBL) producing Escherichia coli 09/30/2018  . Lower urinary tract infectious disease 09/28/2018  . Elevated blood pressure, situational 04/26/2018  . Positive blood culture 04/26/2018  . Shingles (herpes zoster) polyneuropathy 03/11/2018  . Advanced care planning/counseling discussion   . Palliative care by specialist   . Goals of care, counseling/discussion   . Altered mental status 01/24/2018  . Frequent falls 01/24/2018  . Burnout of caregiver 07/21/2016  . DNR no code (do not resuscitate) 04/25/2014  . Urinary incontinence 04/04/2013  . Osteoporosis, post-menopausal 12/06/2012  . At high risk for falls 07/30/2012  . Dementia without behavioral disturbance (HCC) 08/19/2011  . COPD (chronic obstructive pulmonary disease) (HCC) 06/25/2011  . Allergic rhinitis 12/23/2010  . CEREBROVASCULAR DISEASE 08/05/2009  . Spinal stenosis 06/09/2007  . Coronary atherosclerosis 01/25/2007  . HIATAL HERNIA WITH REFLUX 01/25/2007  . Osteoarthritis 01/25/2007    Past Surgical History:  Procedure Laterality Date  . ABDOMINAL HYSTERECTOMY    . BREAST SURGERY N/A    bilateral  . CARDIAC CATHETERIZATION  2008   Mid LAD 40%, RCA 25%.   Marland Kitchen  CHOLECYSTECTOMY    . COMBINED HYSTERECTOMY ABDOMINAL W/ A&P REPAIR / OOPHORECTOMY  approx. 40 years ago   . EYE SURGERY    . MASTECTOMY  1998 & 2007    right -1998 / left 2007  . right knee replacement  09/2009   Dr. Romeo Apple  . TOTAL KNEE ARTHROPLASTY  4/08   left      OB History     Gravida      Para      Term      Preterm      AB      Living  1      SAB      IAB      Ectopic      Multiple      Live Births              Family History  Problem Relation Age  of Onset  . Cancer Mother 95       breast   . Cancer Father 1       prostate  . Hypertension Father   . Hypertension Sister   . Heart disease Sister   . Heart disease Sister   . Heart disease Sister     Social History   Tobacco Use  . Smoking status: Former    Packs/day: 0.25    Years: 70.00    Pack years: 17.50    Types: Cigarettes    Quit date: 03/13/2015    Years since quitting: 5.7  . Smokeless tobacco: Never  Vaping Use  . Vaping Use: Never used  Substance Use Topics  . Alcohol use: No    Alcohol/week: 0.0 standard drinks  . Drug use: No    Home Medications Prior to Admission medications   Medication Sig Start Date End Date Taking? Authorizing Provider  acetaminophen (TYLENOL) 325 MG tablet Take 650 mg by mouth 3 (three) times daily.    [provider]  diclofenac sodium (VOLTAREN) 1 % GEL Apply to both knees QID PRN Patient taking differently: Apply 4 g topically 4 (four) times daily. Apply to both knees 04/29/18   Vassie Loll, MD  fluticasone [facility]) 50 MCG/ACT nasal spray Place 1 spray into both nostrils daily for 30 days. 01/29/18 04/26/18  Sherryll Burger, Pratik D, DO  gabapentin (NEURONTIN) 100 MG capsule Take 1 capsule (100 mg total) by mouth 2 (two) times daily. 09/30/18 10/30/18  [place], Eric J, DO  geriatric multivitamins-minerals (ELDERTONIC/GEVRABON) LIQD Take 15 mLs by mouth daily.    [provider]  lidocaine (LIDODERM) 5 % Place 1 patch onto the skin daily. Remove & Discard patch within 12 hours or as directed by MD 03/02/18   [name], MD  omeprazole (PRILOSEC) 40 MG capsule Take 1 capsule (40 mg total) by mouth daily. 09/30/18   [place], Alvira Philips, DO  Polyethyl Glycol-Propyl Glycol (SYSTANE) 0.4-0.3 % SOLN Apply 1 drop to eye every 6 (six) hours as needed (dry eyes).     [provider]  zinc sulfate 220 (50 Zn) MG capsule Take 220 mg by mouth daily.    [provider]    Allergies    Aricept [donepezil hcl],  Iohexol, Iron, and Penicillins  Review of Systems   Review of Systems  Unable to perform ROS: Dementia   Physical Exam Updated Vital Signs BP 121/74   Pulse (!) 148   Temp (!) 97.2 F (36.2 C)   Resp 20   SpO2 (!) 88%   Physical  Exam Vitals and nursing note reviewed.  Constitutional:      General: She is not in acute distress.    Appearance: Normal appearance.     Comments: Bitemporal wasting  HENT:     Head: Normocephalic and atraumatic.     Right Ear: External ear normal.     Left Ear: External ear normal.     Nose: Nose normal.     Mouth/Throat:     Mouth: Mucous membranes are moist.  Eyes:     General: No scleral icterus.       Right eye: No discharge.        Left eye: No discharge.     Comments: Ointment over eyes  Cardiovascular:     Rate and Rhythm: Normal rate and regular rhythm.     Pulses: Normal pulses.     Heart sounds: Normal heart sounds.  Pulmonary:     Effort: Pulmonary effort is normal. No respiratory distress.     Breath sounds: Normal breath sounds.  Abdominal:     General: Abdomen is flat.     Tenderness: There is no abdominal tenderness.  Musculoskeletal:        General: Normal range of motion.     Cervical back: Normal range of motion.     Right lower leg: No edema.     Left lower leg: No edema.  Skin:    General: Skin is warm and dry.     Capillary Refill: Capillary refill takes less than 2 seconds.  Neurological:     Mental Status: She is alert.     GCS: GCS eye subscore is 4. GCS verbal subscore is 5. GCS motor subscore is 6.     Motor: Tremor present.     Comments: Pt significantly hard of hearing, Aox2 but unclear if she is unable to identify location or unable to hear the question. Patient is shaking her head, mildly tremulous. Otherwise moving upper extremities spontaneously, withdraws to pain in all 4 extremities.   Psychiatric:        Mood and Affect: Mood normal.        Behavior: Behavior normal.    ED Results / Procedures /  Treatments   Labs (all labs ordered are listed, but only abnormal results are displayed) Labs Reviewed  BASIC METABOLIC PANEL - Abnormal; Notable for the following components:      Result Value   Calcium 8.5 (*)    All other components within normal limits  CBC WITH DIFFERENTIAL/PLATELET - Abnormal; Notable for the following components:   Hemoglobin 11.9 (*)    RDW 16.4 (*)    All other components within normal limits  CK - Abnormal; Notable for the following components:   Total CK 28 (*)    All other components within normal limits  RESP PANEL BY RT-PCR (FLU A&B, COVID) ARPGX2  URINE CULTURE  BRAIN NATRIURETIC PEPTIDE  URINALYSIS, ROUTINE W REFLEX MICROSCOPIC  TROPONIN I (HIGH SENSITIVITY)    EKG EKG Interpretation  Date/Time:  Saturday November 23 2020 14:20:38 EST Ventricular Rate:  118 PR Interval:    QRS Duration: 102 QT Interval:  477 QTC Calculation: 485 R Axis:   57 Text Interpretation: Atrial fibrillation Paired ventricular premature complexes Anteroseptal infarct, age indeterminate Interpretation limited secondary to artifact Confirmed by Tanda Rockers ([id number]) on 11/23/2020 3:20:45 PM  Radiology CT HEAD WO CONTRAST ( )  Result Date: 11/23/2020 CLINICAL DATA:  Delirium.  Patient has history of dementia. EXAM: CT HEAD WITHOUT CONTRAST TECHNIQUE:  Contiguous axial images were obtained from the base of the skull through the vertex without intravenous contrast. COMPARISON:  September 28, 2018 FINDINGS: Brain: No evidence of acute infarction, hemorrhage, hydrocephalus, extra-axial collection or mass lesion/mass effect. There is chronic diffuse atrophy. Chronic bilateral periventricular white matter small vessel ischemic changes are noted. Vascular: No hyperdense vessel is noted. Skull: Normal. Negative for fracture or focal lesion. Sinuses/Orbits: No acute finding. Other: None. IMPRESSION: 1. No focal acute intracranial abnormality identified. 2. Chronic diffuse atrophy.  Chronic bilateral periventricular white matter small vessel ischemic change. Electronically Signed   By: Sherian Rein M.D.   On: 11/23/2020 14:12   DG Chest Port 1 View  Result Date: 11/23/2020 CLINICAL DATA:  Altered mental status and chest pain. EXAM: PORTABLE CHEST 1 VIEW COMPARISON:  September 28, 2018 FINDINGS: The heart size and mediastinal contours are stable. Both lungs are clear. Calcified granulomas are identified in the right lung unchanged. Chronic elevation of right hemidiaphragm is unchanged. The visualized skeletal structures are stable. IMPRESSION: No active disease. Electronically Signed   By: Sherian Rein M.D.   On: 11/23/2020 13:27    Procedures Procedures   Medications Ordered in ED Medications  sodium chloride 0.9 % bolus 1,000 mL (1,000 mLs Intravenous New Bag/Given 11/23/20 1503)    ED Course  I have reviewed the triage vital signs and the nursing notes.  Pertinent labs & imaging results that were available during my care of the patient were reviewed by me and considered in my medical decision making (see chart for details).    MDM Rules/Calculators/A&P                           CC: tremor  This patient complains of above; this involves an extensive number of treatment options and is a complaint that carries with it a high risk of complications and morbidity. Vital signs were reviewed. Serious etiologies considered.  Record review:  Previous records obtained and reviewed   Additional history obtained from daughter  Work up as above, notable for:  Labs & imaging results that were available during my care of the patient were reviewed by me and considered in my medical decision making.   I ordered imaging studies which included CXR, CTH and I independently visualized and interpreted imaging which showed no acute process, chronic changes noted.  EKG appears abnormal, there is significant artifact- pt is tremulous. Will repeat EKG and obtain troponin. She has  no chest pain.  Labs at this time appear stable. Pending UA still.  Pt is tremulous causing telemetry to read elevated HR, palpation of radial pulse does not reveal tachycardia on my re-assessment.  At this time patient signed out to incoming physician pending final labs, repeat EKG, re-assessment and disposition. If labs are similar to her baseline would favor discharge back to nursing home.     This chart was dictated using voice recognition software.  Despite best efforts to proofread,  errors can occur which can change the documentation meaning.  Final Clinical Impression(s) / ED Diagnoses Final diagnoses:  Delirium    Rx / DC Orders ED Discharge Orders     None        Sloan Leiter, DO 11/23/20 1542

## 2020-11-23 NOTE — Progress Notes (Signed)
Patient arrived to unit via stretcher on Covid precautions. Patient alert, but not oriented and not following commands.

## 2020-11-24 DIAGNOSIS — Z9013 Acquired absence of bilateral breasts and nipples: Secondary | ICD-10-CM | POA: Diagnosis not present

## 2020-11-24 DIAGNOSIS — K219 Gastro-esophageal reflux disease without esophagitis: Secondary | ICD-10-CM | POA: Diagnosis present

## 2020-11-24 DIAGNOSIS — R532 Functional quadriplegia: Secondary | ICD-10-CM | POA: Diagnosis present

## 2020-11-24 DIAGNOSIS — G9340 Encephalopathy, unspecified: Secondary | ICD-10-CM | POA: Diagnosis not present

## 2020-11-24 DIAGNOSIS — Z7401 Bed confinement status: Secondary | ICD-10-CM | POA: Diagnosis not present

## 2020-11-24 DIAGNOSIS — I10 Essential (primary) hypertension: Secondary | ICD-10-CM | POA: Diagnosis present

## 2020-11-24 DIAGNOSIS — N3000 Acute cystitis without hematuria: Secondary | ICD-10-CM | POA: Diagnosis not present

## 2020-11-24 DIAGNOSIS — Z8249 Family history of ischemic heart disease and other diseases of the circulatory system: Secondary | ICD-10-CM | POA: Diagnosis not present

## 2020-11-24 DIAGNOSIS — G8929 Other chronic pain: Secondary | ICD-10-CM | POA: Diagnosis present

## 2020-11-24 DIAGNOSIS — Z87891 Personal history of nicotine dependence: Secondary | ICD-10-CM | POA: Diagnosis not present

## 2020-11-24 DIAGNOSIS — Z8042 Family history of malignant neoplasm of prostate: Secondary | ICD-10-CM | POA: Diagnosis not present

## 2020-11-24 DIAGNOSIS — Z66 Do not resuscitate: Secondary | ICD-10-CM | POA: Diagnosis present

## 2020-11-24 DIAGNOSIS — R627 Adult failure to thrive: Secondary | ICD-10-CM | POA: Diagnosis present

## 2020-11-24 DIAGNOSIS — U071 COVID-19: Secondary | ICD-10-CM | POA: Diagnosis present

## 2020-11-24 DIAGNOSIS — E860 Dehydration: Secondary | ICD-10-CM | POA: Diagnosis present

## 2020-11-24 DIAGNOSIS — J449 Chronic obstructive pulmonary disease, unspecified: Secondary | ICD-10-CM | POA: Diagnosis present

## 2020-11-24 DIAGNOSIS — Z803 Family history of malignant neoplasm of breast: Secondary | ICD-10-CM | POA: Diagnosis not present

## 2020-11-24 DIAGNOSIS — E785 Hyperlipidemia, unspecified: Secondary | ICD-10-CM | POA: Diagnosis present

## 2020-11-24 DIAGNOSIS — N390 Urinary tract infection, site not specified: Secondary | ICD-10-CM | POA: Diagnosis present

## 2020-11-24 DIAGNOSIS — M549 Dorsalgia, unspecified: Secondary | ICD-10-CM | POA: Diagnosis present

## 2020-11-24 DIAGNOSIS — Z90710 Acquired absence of both cervix and uterus: Secondary | ICD-10-CM | POA: Diagnosis not present

## 2020-11-24 DIAGNOSIS — I2510 Atherosclerotic heart disease of native coronary artery without angina pectoris: Secondary | ICD-10-CM | POA: Diagnosis present

## 2020-11-24 DIAGNOSIS — Z96651 Presence of right artificial knee joint: Secondary | ICD-10-CM | POA: Diagnosis present

## 2020-11-24 DIAGNOSIS — G9341 Metabolic encephalopathy: Secondary | ICD-10-CM | POA: Diagnosis present

## 2020-11-24 DIAGNOSIS — R197 Diarrhea, unspecified: Secondary | ICD-10-CM | POA: Diagnosis present

## 2020-11-24 DIAGNOSIS — R410 Disorientation, unspecified: Secondary | ICD-10-CM | POA: Diagnosis not present

## 2020-11-24 DIAGNOSIS — F0390 Unspecified dementia without behavioral disturbance: Secondary | ICD-10-CM | POA: Diagnosis present

## 2020-11-24 LAB — CBC WITH DIFFERENTIAL/PLATELET
Abs Immature Granulocytes: 0.01 10*3/uL (ref 0.00–0.07)
Basophils Absolute: 0 10*3/uL (ref 0.0–0.1)
Basophils Relative: 0 %
Eosinophils Absolute: 0.1 10*3/uL (ref 0.0–0.5)
Eosinophils Relative: 3 %
HCT: 41.5 % (ref 36.0–46.0)
Hemoglobin: 12.7 g/dL (ref 12.0–15.0)
Immature Granulocytes: 0 %
Lymphocytes Relative: 17 %
Lymphs Abs: 0.8 10*3/uL (ref 0.7–4.0)
MCH: 27.9 pg (ref 26.0–34.0)
MCHC: 30.6 g/dL (ref 30.0–36.0)
MCV: 91.2 fL (ref 80.0–100.0)
Monocytes Absolute: 0.4 10*3/uL (ref 0.1–1.0)
Monocytes Relative: 8 %
Neutro Abs: 3.5 10*3/uL (ref 1.7–7.7)
Neutrophils Relative %: 72 %
Platelets: 150 10*3/uL (ref 150–400)
RBC: 4.55 MIL/uL (ref 3.87–5.11)
RDW: 16.1 % — ABNORMAL HIGH (ref 11.5–15.5)
WBC: 4.8 10*3/uL (ref 4.0–10.5)
nRBC: 0 % (ref 0.0–0.2)

## 2020-11-24 LAB — COMPREHENSIVE METABOLIC PANEL
ALT: 11 U/L (ref 0–44)
AST: 20 U/L (ref 15–41)
Albumin: 2.7 g/dL — ABNORMAL LOW (ref 3.5–5.0)
Alkaline Phosphatase: 120 U/L (ref 38–126)
Anion gap: 8 (ref 5–15)
BUN: 10 mg/dL (ref 8–23)
CO2: 25 mmol/L (ref 22–32)
Calcium: 8.4 mg/dL — ABNORMAL LOW (ref 8.9–10.3)
Chloride: 105 mmol/L (ref 98–111)
Creatinine, Ser: 0.6 mg/dL (ref 0.44–1.00)
GFR, Estimated: >60 mL/min (ref >60)
Glucose, Bld: 80 mg/dL (ref 70–99)
Potassium: 3.9 mmol/L (ref 3.5–5.1)
Sodium: 138 mmol/L (ref 135–145)
Total Bilirubin: 0.7 mg/dL (ref 0.3–1.2)
Total Protein: 6.3 g/dL — ABNORMAL LOW (ref 6.5–8.1)

## 2020-11-24 LAB — D-DIMER, QUANTITATIVE: D-Dimer, Quant: 0.86 ug/mL-FEU — ABNORMAL HIGH (ref 0.00–0.50)

## 2020-11-24 LAB — TSH: TSH: 0.844 u[IU]/mL (ref 0.350–4.500)

## 2020-11-24 LAB — FIBRINOGEN: Fibrinogen: 402 mg/dL (ref 210–475)

## 2020-11-24 LAB — PROCALCITONIN: Procalcitonin: <0.1 ng/mL

## 2020-11-24 LAB — MAGNESIUM: Magnesium: 1.9 mg/dL (ref 1.7–2.4)

## 2020-11-24 LAB — PHOSPHORUS: Phosphorus: 3 mg/dL (ref 2.5–4.6)

## 2020-11-24 MED ORDER — ACETAMINOPHEN 325 MG PO TABS
650.0000 mg | ORAL_TABLET | Freq: Four times a day (QID) | ORAL | Status: DC | PRN
  Administered 2020-11-24: 650 mg via ORAL
  Filled 2020-11-24: qty 2

## 2020-11-24 MED ORDER — ACETAMINOPHEN 650 MG RE SUPP: 650.0000 mg | Freq: Four times a day (QID) | RECTAL | Status: DC | PRN

## 2020-11-24 MED ORDER — INFLUENZA VAC A&B SA ADJ QUAD 0.5 ML IM PRSY
0.5000 mL | PREFILLED_SYRINGE | INTRAMUSCULAR | Status: DC
  Filled 2020-11-24: qty 0.5

## 2020-11-24 MED ORDER — ENOXAPARIN SODIUM 40 MG/0.4ML IJ SOSY
40.0000 mg | PREFILLED_SYRINGE | INTRAMUSCULAR | Status: DC
  Administered 2020-11-24 – 2020-11-26 (×3): 40 mg via SUBCUTANEOUS
  Filled 2020-11-24 (×3): qty 0.4

## 2020-11-24 MED ORDER — SODIUM CHLORIDE 0.9 % IV SOLN
75.0000 mL/h | INTRAVENOUS | Status: AC
  Administered 2020-11-24: 75 mL/h via INTRAVENOUS

## 2020-11-24 NOTE — Evaluation (Addendum)
Clinical/Bedside Swallow Evaluation Patient Details  Name: Jennifer [name] MRN: [id number] Date of Birth: 04-Apr-1922  Today's Date: 11/24/2020 Time: SLP Start Time (ACUTE ONLY): 1405 SLP Stop Time (ACUTE ONLY): 1420 SLP Time Calculation (min) (ACUTE ONLY): 15 min  Past Medical History:  Past Medical History:  Diagnosis Date   Anemia    iron deficiency post op.   CAD (coronary artery disease) 2008   Cath 40% mid LAD, 35% RCA   Cellulitis of finger    right    Chest pain 2006   Chronic back pain    COPD (chronic obstructive pulmonary disease) (HCC)    Gastroesophageal reflux disease    Hiatal hernia    Hyperlipidemia    Memory deficit    Nicotine addiction    Osteoarthritis    status post left TKA; surgery on the right is anticipated in the near future    Pulmonary nodules    stable since 2006   Shingles    right breast    Skin infection    Ulcer    gential   Use of cane as ambulatory aid    Past Surgical History:  Past Surgical History:  Procedure Laterality Date   ABDOMINAL HYSTERECTOMY     BREAST SURGERY N/A    bilateral   CARDIAC CATHETERIZATION  2008   Mid LAD 40%, RCA 25%.    CHOLECYSTECTOMY     COMBINED HYSTERECTOMY ABDOMINAL W/ A&P REPAIR / OOPHORECTOMY  approx. 40 years ago    EYE SURGERY     MASTECTOMY  1998 & 2007    right -1998 / left 2007   right knee replacement  09/2009   Dr. Romeo Apple   TOTAL KNEE ARTHROPLASTY  4/08   left    HPI:  Pt is a 87 y.o. female who presented with confusion worse than baseline. CXR on admissino negative for active disease. CT head negative for acute changes. PMH: dementia, CAD,COPD, HLD. Pt has been by SLP in 2018 and 2020; 04/27/18: dysphagia 3/thin liquids; 09/29/18: dysphagia 1/ thin liquids.    Assessment / Plan / Recommendation  Clinical Impression  Pt was seen for bedside swallow evaluation. She was unable to provide history and her family could not be reached via phone despite four attempts. [facility] was  contacted and advised that the pt required feeding assistance with meals and was on a puree and thin diet with thin liquids with mechanical soft pleasure feeds. A complete oral mechanism exam could not be conducted due to pt's difficulty following commands. She was edentulous and no dentures were found in the room. Mastication was moderately prolonged with regular texture solids and mild-moderately prolonged with dysphagia 3 solids, and mild oral holding was noted with puree, but no significant oral residue was noted. She tolerated all solids and liquids without signs or symptoms of aspiration. Pt's swallow function appears similar to that described during the last evaluation in 2020 and appears to be at baseline considering the report from [facility]. Pt's current diet (i.e., dysphagia 1 with thin liquids) will be continued with allowance of dysphagia 2/3 solids for pleasure. Further acute skilled SLP services are not clinically indicated at this time. SLP Visit Diagnosis: Dysphagia, unspecified (R13.10)    Aspiration Risk  Mild aspiration risk    Diet Recommendation Dysphagia 1 (Puree);Thin liquid (With allowance of dysphagia 3 snacks for comfort.)   Liquid Administration via: Cup;Straw Medication Administration: Crushed with puree (or whole in puree as tolerated) Supervision: Full supervision/cueing for compensatory strategies  Compensations: Slow rate;Small sips/bites;Minimize environmental distractions Postural Changes: Seated upright at 90 degrees    Other  Recommendations Oral Care Recommendations: Oral care BID    Recommendations for follow up therapy are one component of a multi-disciplinary discharge planning process, led by the attending physician.  Recommendations may be updated based on patient status, additional functional criteria and insurance authorization.  Follow up Recommendations No SLP follow up      Assistance Recommended at Discharge    Functional Status Assessment     Frequency and Duration            Prognosis Barriers to Reach Goals: Cognitive deficits      Swallow Study   General Date of Onset: 11/23/20 HPI: Pt is a 87 y.o. female who presented with confusion worse than baseline. CXR on admissino negative for active disease. CT head negative for acute changes. PMH: dementia, CAD,COPD, HLD. Pt has been by SLP in 2018 and 2020; 04/27/18: dysphagia 3/thin liquids; 09/29/18: dysphagia 1/ thin liquids. Type of Study: Bedside Swallow Evaluation Previous Swallow Assessment: See HPI Diet Prior to this Study: Thin liquids;Dysphagia 1 (puree) Temperature Spikes Noted: N/A Respiratory Status: Room air History of Recent Intubation: No Behavior/Cognition: Alert;Cooperative;Pleasant mood Oral Cavity Assessment: Within Functional Limits Oral Care Completed by SLP: No Oral Cavity - Dentition: Edentulous Vision: Functional for self-feeding Self-Feeding Abilities: Total assist Patient Positioning: Upright in bed;Postural control adequate for testing Baseline Vocal Quality: Not observed Volitional Cough: Cognitively unable to elicit Volitional Swallow: Unable to elicit    Oral/Motor/Sensory Function Overall Oral Motor/Sensory Function:  (UTA)   Ice Chips Ice chips: Not tested   Thin Liquid Thin Liquid: Within functional limits Presentation: Straw    Nectar Thick Nectar Thick Liquid: Not tested   Honey Thick Honey Thick Liquid: Not tested   Puree Puree: Impaired Presentation: Spoon Oral Phase Impairments: Poor awareness of bolus Oral Phase Functional Implications: Oral holding   Solid     Solid: Impaired Oral Phase Impairments: Impaired mastication Oral Phase Functional Implications: Impaired mastication     Jennifer [name] I. Jennifer Clock, MS, [name] Acute Rehabilitation Services Office number [phone] Pager [phone]   Jennifer [name] 11/24/2020,3:26 PM

## 2020-11-24 NOTE — Plan of Care (Signed)
  Problem: Education: Goal: Knowledge of risk factors and measures for prevention of condition will improve Outcome: Not Progressing   Problem: Respiratory: Goal: Will maintain a patent airway Outcome: Not Progressing   Problem: Health Behavior/Discharge Planning: Goal: Ability to manage health-related needs will improve Outcome: Not Progressing   Problem: Clinical Measurements: Goal: Ability to maintain clinical measurements within normal limits will improve Outcome: Not Progressing Goal: Will remain free from infection Outcome: Not Progressing

## 2020-11-24 NOTE — Progress Notes (Signed)
Admission history incomplete due to patient's altered mentation. No family present at bedside

## 2020-11-24 NOTE — Progress Notes (Signed)
PROGRESS NOTE        PATIENT DETAILS Name: Jennifer [name] Age: 87 y.o. Sex: female Date of Birth: 1922-12-04 Admit Date: 11/23/2020 Admitting Physician Therisa Doyne, MD PCP:Pcp, No  Brief Narrative: Patient is a 87 y.o. female with history of advanced dementia, COPD, HLD, CAD-who presented with worsening confusion than her usual baseline-she was found to have COVID-19 infection.  Subjective: Sleeping comfortably-arouses to a loud verbal stimuli-confused at baseline.  Not following commands.  Objective: Vitals: Blood pressure (!) 123/58, pulse 66, temperature 99 F (37.2 C), temperature source Axillary, resp. rate 16, height 5\' 5"  (1.651 m), weight 64.2 kg, SpO2 100 %.   Exam: Gen Exam:not in any distress HEENT:atraumatic, normocephalic Chest: B/L clear to auscultation anteriorly CVS:S1S2 regular Abdomen:soft non tender, non distended Extremities:no edema Neurology: Difficult exam due to mental status but seems to be moving all 4 extremities. Skin: no rash  Pertinent Labs/Radiology: Recent Labs  Lab 11/23/20 2134 11/24/20 0831  WBC  --  4.8  HGB  --  12.7  PLT  --  150  NA 140 138  K 4.2 3.9  CREATININE 0.59 0.60  AST 23 20  ALT 9 11  ALKPHOS 114 120  BILITOT 0.8 0.7     11/12>>CXR: No PNA 11/12>> CT head: No acute intracranial abnormality  Assessment/Plan: Acute metabolic encephalopathy: Due to COVID-19 infection/UTI-has advanced dementia at baseline-continue supportive care.  Unclear whether she is at her baseline.  COVID-19 infection: Not hypoxic-no PNA on CXR-continue Paxlovid  UTI: Unable to discern whether she actually has symptoms (advanced dementia)-we will plan on Rocephin x3 days.  Await cultures.  Dementia: Appears to be advanced-maintain delirium precautions.  Frailty/failure to thrive syndrome/functional quadriplegia: Per patient's daughter-very poor oral intake for past 3 weeks-continue IVF-continue IV  antibiotics/antimicrobial therapy and treat underlying infection issues and see if her appetite/failure to thrive syndrome improves.  She is mostly bedbound-and is dependent on nursing staff for all activities of daily living.  If her overall condition does not improve-May need to consider consulting palliative care for goals of care discussion.  BMI Estimated body mass index is 23.55 kg/m as calculated from the following:   Height as of this encounter: 5\' 5"  (1.651 m).   Weight as of this encounter: 64.2 kg.   Procedures: None Consults: None DVT Prophylaxis: Lovenox Code Status: DNR Family Communication: Daughter 11/26/20 [phone]-updated over the phone.  Time spent: 35 minutes-Greater than 50% of this time was spent in counseling, explanation of diagnosis, planning of further management, and coordination of care.   Disposition Plan: Status is: Observation  The patient will require care spanning > 2 midnights and should be moved to inpatient because: UTI/COVID-19 infection-severe failure to thrive syndrome-on IVF/IV antibiotics.  Not stable for discharge back to assisted living/SNF.     Diet: Diet Order             Diet Heart Room service appropriate? Yes; Fluid consistency: Thin  Diet effective now                     Antimicrobial agents: Anti-infectives (From admission, onward)    Start     Dose/Rate Route Frequency Ordered Stop   11/24/20 1900  cefTRIAXone (ROCEPHIN) 1 g in sodium chloride 0.9 % 100 mL IVPB        1 g 200 mL/hr over 30  Minutes Intravenous Every 24 hours 11/23/20 1856     11/23/20 2200  nirmatrelvir/ritonavir EUA (PAXLOVID) 3 tablet  Status:  Discontinued        3 tablet Oral 2 times daily 11/23/20 1758 11/23/20 1826   11/23/20 2200  nirmatrelvir/ritonavir EUA (PAXLOVID) 3 tablet        3 tablet Oral 2 times daily 11/23/20 1826 11/28/20 2159   11/23/20 1745  cefTRIAXone (ROCEPHIN) 1 g in sodium chloride 0.9 % 100 mL IVPB        1  g 200 mL/hr over 30 Minutes Intravenous  Once 11/23/20 1741 11/23/20 1906        MEDICATIONS: Scheduled Meds:  enoxaparin (LOVENOX) injection  40 mg Subcutaneous Q24H   [START ON 11/25/2020] influenza vaccine adjuvanted  0.5 mL Intramuscular Tomorrow-1000   nirmatrelvir/ritonavir EUA  3 tablet Oral BID   Continuous Infusions:  sodium chloride 75 mL/hr (11/24/20 0840)   cefTRIAXone (ROCEPHIN)  IV     PRN Meds:.acetaminophen **OR** acetaminophen   I have personally reviewed following labs and imaging studies  LABORATORY DATA: CBC: Recent Labs  Lab 11/23/20 1301 11/24/20 0831  WBC 5.7 4.8  NEUTROABS 2.6 3.5  HGB 11.9* 12.7  HCT 39.5 41.5  MCV 93.6 91.2  PLT 177 150    Basic Metabolic Panel: Recent Labs  Lab 11/23/20 1301 11/23/20 2134 11/24/20 0831  NA 142 140 138  K 4.1 4.2 3.9  CL 106 108 105  CO2 28 20* 25  GLUCOSE 85 77 80  BUN 15 14 10   CREATININE 0.65 0.59 0.60  CALCIUM 8.5* 8.3* 8.4*  MG  --  1.9 1.9  PHOS  --  3.2 3.0    GFR: Estimated Creatinine Clearance: 37 mL/min (by C-G formula based on SCr of 0.6 mg/dL).  Liver Function Tests: Recent Labs  Lab 11/23/20 2134 11/24/20 0831  AST 23 20  ALT 9 11  ALKPHOS 114 120  BILITOT 0.8 0.7  PROT 6.0* 6.3*  ALBUMIN 2.7* 2.7*   No results for input(s): LIPASE, AMYLASE in the last 168 hours. No results for input(s): AMMONIA in the last 168 hours.  Coagulation Profile: No results for input(s): INR, PROTIME in the last 168 hours.  Cardiac Enzymes: Recent Labs  Lab 11/23/20 1301  CKTOTAL 28*    BNP (last 3 results) No results for input(s): PROBNP in the last 8760 hours.  Lipid Profile: No results for input(s): CHOL, HDL, LDLCALC, TRIG, CHOLHDL, LDLDIRECT in the last 72 hours.  Thyroid Function Tests: Recent Labs    11/24/20 0831  TSH 0.844    Anemia Panel: No results for input(s): VITAMINB12, FOLATE, FERRITIN, TIBC, IRON, RETICCTPCT in the last 72 hours.  Urine analysis:     Component Value Date/Time   COLORURINE AMBER (A) 11/23/2020 1301   APPEARANCEUR TURBID (A) 11/23/2020 1301   LABSPEC 1.017 11/23/2020 1301   PHURINE 7.0 11/23/2020 1301   GLUCOSEU NEGATIVE 11/23/2020 1301   GLUCOSEU NEG mg/dL 13/12/2020 78/29/5621   HGBUR MODERATE (A) 11/23/2020 1301   HGBUR negative 05/02/2008 1547   BILIRUBINUR NEGATIVE 11/23/2020 1301   BILIRUBINUR small 12/05/2012 1324   KETONESUR 5 (A) 11/23/2020 1301   PROTEINUR 100 (A) 11/23/2020 1301   UROBILINOGEN 1.0 12/05/2012 1324   UROBILINOGEN 0.2 05/02/2008 1547   NITRITE POSITIVE (A) 11/23/2020 1301   LEUKOCYTESUR TRACE (A) 11/23/2020 1301    Sepsis Labs: Lactic Acid, Venous    Component Value Date/Time   LATICACIDVEN 1.3 04/26/2018 1440    MICROBIOLOGY: Recent  Results (from the past 240 hour(s))  Resp Panel by RT-PCR (Flu A&B, Covid) Nasopharyngeal Swab     Status: Abnormal   Collection Time: 11/23/20  1:09 PM   Specimen: Nasopharyngeal Swab; Nasopharyngeal(NP) swabs in vial transport medium  Result Value Ref Range Status   SARS Coronavirus 2 by RT PCR POSITIVE (A) NEGATIVE Final    Comment: CRITICAL RESULT CALLED TO, READ BACK BY AND VERIFIED WITH: A. OLEARY,RN 11/23/2020 1748 BY PWHITE. (NOTE) SARS-CoV-2 target nucleic acids are DETECTED.  The SARS-CoV-2 RNA is generally detectable in upper respiratory specimens during the acute phase of infection. Positive results are indicative of the presence of the identified virus, but do not rule out bacterial infection or co-infection with other pathogens not detected by the test. Clinical correlation with patient history and other diagnostic information is necessary to determine patient infection status. The expected result is Negative.  Fact Sheet for Patients: BloggerCourse.com  Fact Sheet for Healthcare Providers: SeriousBroker.it  This test is not yet approved or cleared by the [place] FDA and  has  been authorized for detection and/or diagnosis of SARS-CoV-2 by FDA under an Emergency Use Authorization (EUA).  This EUA will remain in effect (meaning t his test can be used) for the duration of  the COVID-19 declaration under Section 564(b)(1) of the Act, 21 U.S.C. section 360bbb-3(b)(1), unless the authorization is terminated or revoked sooner.     Influenza A by PCR NEGATIVE NEGATIVE Final   Influenza B by PCR NEGATIVE NEGATIVE Final    Comment: (NOTE) The Xpert Xpress SARS-CoV-2/FLU/RSV plus assay is intended as an aid in the diagnosis of influenza from Nasopharyngeal swab specimens and should not be used as a sole basis for treatment. Nasal washings and aspirates are unacceptable for Xpert Xpress SARS-CoV-2/FLU/RSV testing.  Fact Sheet for Patients: BloggerCourse.com  Fact Sheet for Healthcare Providers: SeriousBroker.it  This test is not yet approved or cleared by the [place] FDA and has been authorized for detection and/or diagnosis of SARS-CoV-2 by FDA under an Emergency Use Authorization (EUA). This EUA will remain in effect (meaning this test can be used) for the duration of the COVID-19 declaration under Section 564(b)(1) of the Act, 21 U.S.C. section 360bbb-3(b)(1), unless the authorization is terminated or revoked.  Performed at [facility], [address] [place], [place] [address]   MRSA Next Gen by PCR, Nasal     Status: None   Collection Time: 11/23/20  9:50 PM   Specimen: Nasal Mucosa; Nasal Swab  Result Value Ref Range Status   MRSA by PCR Next Gen NOT DETECTED NOT DETECTED Final    Comment: (NOTE) The GeneXpert MRSA Assay (FDA approved for NASAL specimens only), is one component of a comprehensive MRSA colonization surveillance program. It is not intended to diagnose MRSA infection nor to guide or monitor treatment for MRSA infections. Test performance is not FDA approved in patients less  than 47 years old. Performed at [facility], [address], [place], [place] [address]     RADIOLOGY STUDIES/RESULTS: CT HEAD WO CONTRAST ( )  Result Date: 11/23/2020 CLINICAL DATA:  Delirium.  Patient has history of dementia. EXAM: CT HEAD WITHOUT CONTRAST TECHNIQUE: Contiguous axial images were obtained from the base of the skull through the vertex without intravenous contrast. COMPARISON:  September 28, 2018 FINDINGS: Brain: No evidence of acute infarction, hemorrhage, hydrocephalus, extra-axial collection or mass lesion/mass effect. There is chronic diffuse atrophy. Chronic bilateral periventricular white matter small vessel ischemic changes are  noted. Vascular: No hyperdense vessel is noted. Skull: Normal. Negative for fracture or focal lesion. Sinuses/Orbits: No acute finding. Other: None. IMPRESSION: 1. No focal acute intracranial abnormality identified. 2. Chronic diffuse atrophy. Chronic bilateral periventricular white matter small vessel ischemic change. Electronically Signed   By: Sherian Rein M.D.   On: 11/23/2020 14:12   DG Chest Port 1 View  Result Date: 11/23/2020 CLINICAL DATA:  Altered mental status and chest pain. EXAM: PORTABLE CHEST 1 VIEW COMPARISON:  September 28, 2018 FINDINGS: The heart size and mediastinal contours are stable. Both lungs are clear. Calcified granulomas are identified in the right lung unchanged. Chronic elevation of right hemidiaphragm is unchanged. The visualized skeletal structures are stable. IMPRESSION: No active disease. Electronically Signed   By: Sherian Rein M.D.   On: 11/23/2020 13:27     LOS: 0 days   Jeoffrey Massed, MD  [facility]    To contact the attending provider between 7A-7P or the covering provider during after hours 7P-7A, please log into the web site [email or web address] and access using universal [place] password for that web site. If you do not have the password, please call the hospital operator.  11/24/2020,  10:11 AM

## 2020-11-24 NOTE — Evaluation (Signed)
Occupational Therapy Evaluation Patient Details Name: Jennifer [name] MRN: [id number] DOB: January 10, 1923 Today's Date: 11/24/2020   History of Present Illness 87 y.o. F admitted on 11/12 due to increased confusion and COVID+. PMH significant of Dementia, CAD, COPD, and HLD.   Clinical Impression   Pt admitted due to concerns listed above. PTA pt was residing at [facility] and requiring total assist for all ADL's and mobility. Pt appears not far from her baseline physically. OT attempted to get pt to participate in self feeding, pt not following commands and when OT placed spoon in pt's hand she dropped her hand. OT provided total PROM to bring pt's hand with the spoon in it to her mouth, where she did take a bite. Total A +2 for bed mobility. Due to pt most likely being near her baseline, OT will sign off. Please re-consult if new needs arise.      Recommendations for follow up therapy are one component of a multi-disciplinary discharge planning process, led by the attending physician.  Recommendations may be updated based on patient status, additional functional criteria and insurance authorization.   Follow Up Recommendations  Long-term institutional care without follow-up therapy    Assistance Recommended at Discharge Frequent or constant Supervision/Assistance  Functional Status Assessment  Patient has had a recent decline in their functional status and/or demonstrates limited ability to make significant improvements in function in a reasonable and predictable amount of time  Equipment Recommendations  None recommended by OT    Recommendations for Other Services       Precautions / Restrictions Precautions Precautions: Fall Restrictions Weight Bearing Restrictions: No      Mobility Bed Mobility Overal bed mobility: Needs Assistance Bed Mobility: Rolling Rolling: Total assist;+2 for safety/equipment;+2 for physical assistance         General bed mobility comments: Pt  requiring total A for rolling in bed X2    Transfers                   General transfer comment: Deferred      Balance                                           ADL either performed or assessed with clinical judgement   ADL Overall ADL's : At baseline;Needs assistance/impaired                                       General ADL Comments: Pt is total care at baseline per chart.     Vision Baseline Vision/History: 2 Legally blind Ability to See in Adequate Light: 3 Highly impaired Patient Visual Report: No change from baseline Vision Assessment?: No apparent visual deficits Additional Comments: Per chart pt has poor vision at baseline, does blink to threat, does not look towards verbal stimulous or track objects     Perception     Praxis      Pertinent Vitals/Pain Pain Assessment: No/denies pain     Hand Dominance     Extremity/Trunk Assessment Upper Extremity Assessment Upper Extremity Assessment: Generalized weakness;RUE deficits/detail;LUE deficits/detail RUE Deficits / Details: PROM shoulder flex to 90 degress, hand to face RUE Coordination: decreased gross motor;decreased fine motor LUE Deficits / Details: PROM shoulder flex to 90 degress, hand to face LUE Coordination: decreased gross  motor;decreased fine motor   Lower Extremity Assessment Lower Extremity Assessment: Defer to PT evaluation       Communication Communication Communication: Expressive difficulties;Receptive difficulties (Pt minimally verbal and not responding to commands)   Cognition Arousal/Alertness: Lethargic Behavior During Therapy: Flat affect Overall Cognitive Status: History of cognitive impairments - at baseline                                 General Comments: Pt has advanced dementia     General Comments  VSS on RA    Exercises     Shoulder Instructions      Home Living Family/patient expects to be discharged to::  Skilled nursing facility                                 Additional Comments: From [facility]      Prior Functioning/Environment Prior Level of Function : Needs assist;Patient poor historian/Family not available             Mobility Comments: Per chart pt is mostly bed bound, dependent on staff for transfers ADLs Comments: Per chart, pt is dependent for all ADL's        OT Problem List: Decreased strength;Decreased range of motion;Decreased activity tolerance;Impaired balance (sitting and/or standing);Impaired vision/perception;Decreased cognition;Decreased safety awareness;Decreased knowledge of use of DME or AE;Impaired UE functional use      OT Treatment/Interventions:      OT Goals(Current goals can be found in the care plan section) Acute Rehab OT Goals Patient Stated Goal: none stated OT Goal Formulation: Patient unable to participate in goal setting Time For Goal Achievement: 11/24/20 Potential to Achieve Goals: Poor  OT Frequency:     Barriers to D/C:            Co-evaluation              AM-PAC OT "6 Clicks" Daily Activity     Outcome Measure Help from another person eating meals?: Total Help from another person taking care of personal grooming?: Total Help from another person toileting, which includes using toliet, bedpan, or urinal?: Total Help from another person bathing (including washing, rinsing, drying)?: Total Help from another person to put on and taking off regular upper body clothing?: Total Help from another person to put on and taking off regular lower body clothing?: Total 6 Click Score: 6   End of Session Nurse Communication: Mobility status  Activity Tolerance: Patient limited by lethargy Patient left: in bed;with call bell/phone within reach;with bed alarm set;with nursing/sitter in room  OT Visit Diagnosis: Muscle weakness (generalized) (M62.81);Low vision, both eyes (H54.2)                Time: 0102-7253 OT  Time Calculation (min): 15 min Charges:  OT General Charges $OT Visit: 1 Visit OT Evaluation $OT Eval Moderate Complexity: 1 Mod  Kizzi Overbey H., OTR/L Acute Rehabilitation  Viral Schramm Elane Bing Plume 11/24/2020, 12:46 PM

## 2020-11-24 NOTE — Plan of Care (Signed)
Problem: Respiratory:  Goal: Will maintain a patent airway  Outcome: Not Progressing

## 2020-11-25 ENCOUNTER — Other Ambulatory Visit: Payer: Self-pay

## 2020-11-25 DIAGNOSIS — N390 Urinary tract infection, site not specified: Secondary | ICD-10-CM

## 2020-11-25 LAB — GLUCOSE, CAPILLARY: Glucose-Capillary: 120 mg/dL — ABNORMAL HIGH (ref 70–99)

## 2020-11-25 MED ORDER — ORAL CARE MOUTH RINSE
15.0000 mL | Freq: Two times a day (BID) | OROMUCOSAL | Status: DC
  Administered 2020-11-25 – 2020-11-26 (×4): 15 mL via OROMUCOSAL

## 2020-11-25 MED ORDER — SODIUM CHLORIDE 0.9 % IV SOLN: INTRAVENOUS | Status: DC | PRN

## 2020-11-25 NOTE — TOC Initial Note (Signed)
Transition of Care [facility]) - Initial/Assessment Note    Patient Details  Name: Jennifer [name] MRN: [id number] Date of Birth: 23-Dec-1922  [facility]) CM/SW Contact:    Carley Hammed, LCSWA Phone Number: 11/25/2020, 12:44 PM  Clinical Narrative:                 CSW was advised by MD that pt may be ready to DC tomorrow back to [place]. CSW followed up with [facility], they are able to accept pt tomorrow. She will not need a covid test or an insurance authorization. CSW spoke with pt's daughter Reola Mosher who is agreeable to pt returning and expressed concerns about medication that were passed to the MD. [facility] will continue to follow for DC tomorrow.   Expected Discharge Plan: Skilled Nursing Facility Barriers to Discharge: Continued Medical Work up   Patient Goals and CMS Choice Patient states their goals for this hospitalization and ongoing recovery are:: Pt unable to participate in discharge planning due to disorientation. CMS Medicare.gov Compare Post Acute Care list provided to:: Patient Represenative (must comment) (Daughter) Choice offered to / list presented to : Adult Children  Expected Discharge Plan and Services Expected Discharge Plan: Skilled Nursing Facility       Living arrangements for the past 2 months: Skilled Nursing Facility                                      Prior Living Arrangements/Services Living arrangements for the past 2 months: Skilled Nursing Facility Lives with:: Facility Resident Patient language and need for interpreter reviewed:: Yes Do you feel safe going back to the place where you live?: Yes      Need for Family Participation in Patient Care: Yes (Comment) Care giver support system in place?: Yes (comment)   Criminal Activity/Legal Involvement Pertinent to Current Situation/Hospitalization: No - Comment as needed  Activities of Daily Living Home Assistive Devices/Equipment: Other (Comment) (unknown) ADL Screening  (condition at time of admission) Patient's cognitive ability adequate to safely complete daily activities?: No Is the patient deaf or have difficulty hearing?: Yes Does the patient have difficulty seeing, even when wearing glasses/contacts?: Yes Does the patient have difficulty concentrating, remembering, or making decisions?: Yes Patient able to express need for assistance with ADLs?: No Does the patient have difficulty dressing or bathing?: Yes Independently performs ADLs?: No Communication: Dependent Is this a change from baseline?: Pre-admission baseline Dressing (OT): Dependent Is this a change from baseline?: Pre-admission baseline Grooming: Dependent Is this a change from baseline?: Pre-admission baseline Feeding: Dependent Is this a change from baseline?: Pre-admission baseline Bathing: Dependent Is this a change from baseline?: Pre-admission baseline Toileting: Dependent Is this a change from baseline?: Pre-admission baseline Walks in Home: Dependent Is this a change from baseline?: Pre-admission baseline Does the patient have difficulty walking or climbing stairs?: Yes Weakness of Legs: Both Weakness of Arms/Hands: None  Permission Sought/Granted Permission sought to share information with : Family Supports Permission granted to share information with : Yes, Verbal Permission Granted  Share Information with NAME: [name]     Permission granted to share info w Relationship: Daughter  Permission granted to share info w Contact Information: [phone]  Emotional Assessment Appearance:: Appears stated age Attitude/Demeanor/Rapport: Unable to Assess Affect (typically observed): Unable to Assess   Alcohol / Substance Use: Not Applicable Psych Involvement: No (comment)  Admission diagnosis:  Delirium [R41.0] Acute cystitis  without hematuria [N30.00] Acute encephalopathy [G93.40] COVID-19 [U07.1] Encephalopathy [G93.40] Patient Active Problem List    Diagnosis Date Noted   Encephalopathy 11/24/2020   Acute encephalopathy 11/23/2020   COVID-19 virus infection 11/23/2020   UTI due to extended-spectrum beta lactamase (ESBL) producing Escherichia coli 09/30/2018   Lower urinary tract infectious disease 09/28/2018   Elevated blood pressure, situational 04/26/2018   Positive blood culture 04/26/2018   Shingles (herpes zoster) polyneuropathy 03/11/2018   Advanced care planning/counseling discussion    Palliative care by specialist    Goals of care, counseling/discussion    Altered mental status 01/24/2018   Frequent falls 01/24/2018   Burnout of caregiver 07/21/2016   DNR no code (do not resuscitate) 04/25/2014   HTN (hypertension) 04/22/2014   Urinary incontinence 04/04/2013   Osteoporosis, post-menopausal 12/06/2012   At high risk for falls 07/30/2012   Dementia without behavioral disturbance ([place]) 08/19/2011   COPD (chronic obstructive pulmonary disease) ([place]) 06/25/2011   Allergic rhinitis 12/23/2010   CEREBROVASCULAR DISEASE 08/05/2009   Spinal stenosis 06/09/2007   Coronary atherosclerosis 01/25/2007   HIATAL HERNIA WITH REFLUX 01/25/2007   Osteoarthritis 01/25/2007   PCP:  Pcp, No Pharmacy:  No Pharmacies Listed    Social Determinants of Health (SDOH) Interventions    Readmission Risk Interventions No flowsheet data found.

## 2020-11-25 NOTE — Progress Notes (Signed)
PROGRESS NOTE        PATIENT DETAILS Name: Jennifer [name] Age: 87 y.o. Sex: female Date of Birth: [birth year] Admit Date: 11/23/2020 Admitting Physician Dewayne Shorter Levora Dredge, MD PCP:Pcp, No  Brief Narrative: Patient is a 87 y.o. female with history of advanced dementia, COPD, HLD, CAD-who presented with worsening confusion than her usual baseline-she was found to have COVID-19 infection.  Subjective: Sleeping comfortably-more awake compared to yesterday.  No tremors evident on my exam this morning.  Objective: Vitals: Blood pressure 127/63, pulse 82, temperature 97.7 F (36.5 C), temperature source Oral, resp. rate 17, height 5\' 5"  (1.651 m), weight 64.2 kg, SpO2 100 %.   Exam: Gen Exam: Not in any distress-lying comfortably in bed. HEENT:atraumatic, normocephalic Chest: B/L clear to auscultation anteriorly CVS:S1S2 regular Abdomen:soft non tender, non distended Extremities:no edema Neurology: Non focal Skin: no rash   Pertinent Labs/Radiology: Recent Labs  Lab 11/23/20 2134 11/24/20 0831  WBC  --  4.8  HGB  --  12.7  PLT  --  150  NA 140 138  K 4.2 3.9  CREATININE 0.59 0.60  AST 23 20  ALT 9 11  ALKPHOS 114 120  BILITOT 0.8 0.7      11/12>>CXR: No PNA 11/12>> CT head: No acute intracranial abnormality  Assessment/Plan: Acute metabolic encephalopathy: Due to COVID-19 infection/UTI-has advanced dementia at baseline-suspect mental status is close to baseline-no family at bedside to collaborate.    COVID-19 infection: Not hypoxic-no pneumonia on CXR-continue Paxil with x5 days.  UTI: Unable to discern whether she actually has symptoms (advanced dementia)-continue Rocephin x3 days-unfortunately urine cultures were never sent-doubt any utility of sending cultures at this point as patient already on antimicrobial therapy.  Dementia: Appears to be advanced-maintain delirium precautions.  Frailty/failure to thrive syndrome/functional  quadriplegia: Per patient's daughter-very poor oral intake for past 3 weeks-continue IVF-continue IV antibiotics/antimicrobial therapy and treat underlying infection issues and see if her appetite/failure to thrive syndrome improves.  She is mostly bedbound-and is dependent on nursing staff for all activities of daily living.  If her overall condition does not improve-May need to consider consulting palliative care for goals of care discussion.  BMI Estimated body mass index is 23.55 kg/m as calculated from the following:   Height as of this encounter: 5\' 5"  (1.651 m).   Weight as of this encounter: 64.2 kg.   Procedures: None Consults: None DVT Prophylaxis: Lovenox Code Status: DNR Family Communication: Daughter 11/26/20 [phone]-updated over the phone.  Time spent: 25 minutes-Greater than 50% of this time was spent in counseling, explanation of diagnosis, planning of further management, and coordination of care.   Disposition Plan: Status is: Observation  The patient will require care spanning > 2 midnights and should be moved to inpatient because: UTI/COVID-19 infection-severe failure to thrive syndrome-on IVF/IV antibiotics.  Not stable for discharge back to assisted living/SNF.     Diet: Diet Order             DIET - DYS 1 Room service appropriate? Yes; Fluid consistency: Thin  Diet effective now                     Antimicrobial agents: Anti-infectives (From admission, onward)    Start     Dose/Rate Route Frequency Ordered Stop   11/24/20 1900  cefTRIAXone (ROCEPHIN) 1 g in sodium chloride 0.9 %  100 mL IVPB        1 g 200 mL/hr over 30 Minutes Intravenous Every 24 hours 11/23/20 1856     11/23/20 2200  nirmatrelvir/ritonavir EUA (PAXLOVID) 3 tablet  Status:  Discontinued        3 tablet Oral 2 times daily 11/23/20 1758 11/23/20 1826   11/23/20 2200  nirmatrelvir/ritonavir EUA (PAXLOVID) 3 tablet        3 tablet Oral 2 times daily 11/23/20 1826 11/28/20  2159   11/23/20 1745  cefTRIAXone (ROCEPHIN) 1 g in sodium chloride 0.9 % 100 mL IVPB        1 g 200 mL/hr over 30 Minutes Intravenous  Once 11/23/20 1741 11/24/20 0705        MEDICATIONS: Scheduled Meds:  enoxaparin (LOVENOX) injection  40 mg Subcutaneous Q24H   influenza vaccine adjuvanted  0.5 mL Intramuscular Tomorrow-1000   mouth rinse  15 mL Mouth Rinse BID   nirmatrelvir/ritonavir EUA  3 tablet Oral BID   Continuous Infusions:  sodium chloride     cefTRIAXone (ROCEPHIN)  IV Stopped (11/24/20 1853)   PRN Meds:.sodium chloride, acetaminophen **OR** acetaminophen   I have personally reviewed following labs and imaging studies  LABORATORY DATA: CBC: Recent Labs  Lab 11/23/20 1301 11/24/20 0831  WBC 5.7 4.8  NEUTROABS 2.6 3.5  HGB 11.9* 12.7  HCT 39.5 41.5  MCV 93.6 91.2  PLT 177 150     Basic Metabolic Panel: Recent Labs  Lab 11/23/20 1301 11/23/20 2134 11/24/20 0831  NA 142 140 138  K 4.1 4.2 3.9  CL 106 108 105  CO2 28 20* 25  GLUCOSE 85 77 80  BUN 15 14 10   CREATININE 0.65 0.59 0.60  CALCIUM 8.5* 8.3* 8.4*  MG  --  1.9 1.9  PHOS  --  3.2 3.0     GFR: Estimated Creatinine Clearance: 37 mL/min (by C-G formula based on SCr of 0.6 mg/dL).  Liver Function Tests: Recent Labs  Lab 11/23/20 2134 11/24/20 0831  AST 23 20  ALT 9 11  ALKPHOS 114 120  BILITOT 0.8 0.7  PROT 6.0* 6.3*  ALBUMIN 2.7* 2.7*    No results for input(s): LIPASE, AMYLASE in the last 168 hours. No results for input(s): AMMONIA in the last 168 hours.  Coagulation Profile: No results for input(s): INR, PROTIME in the last 168 hours.  Cardiac Enzymes: Recent Labs  Lab 11/23/20 1301  CKTOTAL 28*     BNP (last 3 results) No results for input(s): PROBNP in the last 8760 hours.  Lipid Profile: No results for input(s): CHOL, HDL, LDLCALC, TRIG, CHOLHDL, LDLDIRECT in the last 72 hours.  Thyroid Function Tests: Recent Labs    11/24/20 0831  TSH 0.844      Anemia Panel: No results for input(s): VITAMINB12, FOLATE, FERRITIN, TIBC, IRON, RETICCTPCT in the last 72 hours.  Urine analysis:    Component Value Date/Time   COLORURINE AMBER (A) 11/23/2020 1301   APPEARANCEUR TURBID (A) 11/23/2020 1301   LABSPEC 1.017 11/23/2020 1301   PHURINE 7.0 11/23/2020 1301   GLUCOSEU NEGATIVE 11/23/2020 1301   GLUCOSEU NEG mg/dL 11/55/2080 2233   HGBUR MODERATE (A) 11/23/2020 1301   HGBUR negative 05/02/2008 1547   BILIRUBINUR NEGATIVE 11/23/2020 1301   BILIRUBINUR small 12/05/2012 1324   KETONESUR 5 (A) 11/23/2020 1301   PROTEINUR 100 (A) 11/23/2020 1301   UROBILINOGEN 1.0 12/05/2012 1324   UROBILINOGEN 0.2 05/02/2008 1547   NITRITE POSITIVE (A) 11/23/2020 1301   LEUKOCYTESUR  TRACE (A) 11/23/2020 1301    Sepsis Labs: Lactic Acid, Venous    Component Value Date/Time   LATICACIDVEN 1.3 04/26/2018 1440    MICROBIOLOGY: Recent Results (from the past 240 hour(s))  Resp Panel by RT-PCR (Flu A&B, Covid) Nasopharyngeal Swab     Status: Abnormal   Collection Time: 11/23/20  1:09 PM   Specimen: Nasopharyngeal Swab; Nasopharyngeal(NP) swabs in vial transport medium  Result Value Ref Range Status   SARS Coronavirus 2 by RT PCR POSITIVE (A) NEGATIVE Final    Comment: CRITICAL RESULT CALLED TO, READ BACK BY AND VERIFIED WITH: A. OLEARY,RN 11/23/2020 1748 BY PWHITE. (NOTE) SARS-CoV-2 target nucleic acids are DETECTED.  The SARS-CoV-2 RNA is generally detectable in upper respiratory specimens during the acute phase of infection. Positive results are indicative of the presence of the identified virus, but do not rule out bacterial infection or co-infection with other pathogens not detected by the test. Clinical correlation with patient history and other diagnostic information is necessary to determine patient infection status. The expected result is Negative.  Fact Sheet for Patients: BloggerCourse.com  Fact Sheet for  Healthcare Providers: SeriousBroker.it  This test is not yet approved or cleared by the [place] FDA and  has been authorized for detection and/or diagnosis of SARS-CoV-2 by FDA under an Emergency Use Authorization (EUA).  This EUA will remain in effect (meaning t his test can be used) for the duration of  the COVID-19 declaration under Section 564(b)(1) of the Act, 21 U.S.C. section 360bbb-3(b)(1), unless the authorization is terminated or revoked sooner.     Influenza A by PCR NEGATIVE NEGATIVE Final   Influenza B by PCR NEGATIVE NEGATIVE Final    Comment: (NOTE) The Xpert Xpress SARS-CoV-2/FLU/RSV plus assay is intended as an aid in the diagnosis of influenza from Nasopharyngeal swab specimens and should not be used as a sole basis for treatment. Nasal washings and aspirates are unacceptable for Xpert Xpress SARS-CoV-2/FLU/RSV testing.  Fact Sheet for Patients: BloggerCourse.com  Fact Sheet for Healthcare Providers: SeriousBroker.it  This test is not yet approved or cleared by the [place] FDA and has been authorized for detection and/or diagnosis of SARS-CoV-2 by FDA under an Emergency Use Authorization (EUA). This EUA will remain in effect (meaning this test can be used) for the duration of the COVID-19 declaration under Section 564(b)(1) of the Act, 21 U.S.C. section 360bbb-3(b)(1), unless the authorization is terminated or revoked.  Performed at [facility], [address] [place], [place] [address]   MRSA Next Gen by PCR, Nasal     Status: None   Collection Time: 11/23/20  9:50 PM   Specimen: Nasal Mucosa; Nasal Swab  Result Value Ref Range Status   MRSA by PCR Next Gen NOT DETECTED NOT DETECTED Final    Comment: (NOTE) The GeneXpert MRSA Assay (FDA approved for NASAL specimens only), is one component of a comprehensive MRSA colonization surveillance program. It is not  intended to diagnose MRSA infection nor to guide or monitor treatment for MRSA infections. Test performance is not FDA approved in patients less than 44 years old. Performed at [facility], [address] [place], [place] [address]     RADIOLOGY STUDIES/RESULTS: CT HEAD WO CONTRAST ( )  Result Date: 11/23/2020 CLINICAL DATA:  Delirium.  Patient has history of dementia. EXAM: CT HEAD WITHOUT CONTRAST TECHNIQUE: Contiguous axial images were obtained from the base of the skull through the vertex without intravenous contrast. COMPARISON:  September 28, 2018 FINDINGS:  Brain: No evidence of acute infarction, hemorrhage, hydrocephalus, extra-axial collection or mass lesion/mass effect. There is chronic diffuse atrophy. Chronic bilateral periventricular white matter small vessel ischemic changes are noted. Vascular: No hyperdense vessel is noted. Skull: Normal. Negative for fracture or focal lesion. Sinuses/Orbits: No acute finding. Other: None. IMPRESSION: 1. No focal acute intracranial abnormality identified. 2. Chronic diffuse atrophy. Chronic bilateral periventricular white matter small vessel ischemic change. Electronically Signed   By: Sherian Rein M.D.   On: 11/23/2020 14:12   DG Chest Port 1 View  Result Date: 11/23/2020 CLINICAL DATA:  Altered mental status and chest pain. EXAM: PORTABLE CHEST 1 VIEW COMPARISON:  September 28, 2018 FINDINGS: The heart size and mediastinal contours are stable. Both lungs are clear. Calcified granulomas are identified in the right lung unchanged. Chronic elevation of right hemidiaphragm is unchanged. The visualized skeletal structures are stable. IMPRESSION: No active disease. Electronically Signed   By: Sherian Rein M.D.   On: 11/23/2020 13:27     LOS: 1 day   Jeoffrey Massed, MD  [facility]    To contact the attending provider between 7A-7P or the covering provider during after hours 7P-7A, please log into the web site [email or web address] and  access using universal [place] password for that web site. If you do not have the password, please call the hospital operator.  11/25/2020, 12:45 PM

## 2020-11-25 NOTE — Progress Notes (Signed)
Via facility

## 2020-11-25 NOTE — Plan of Care (Signed)
Pt currently lying in bed with eyes closed, no s/s of pain or discomfort at this time, pt has been alert during am shift, answering some questions appropriately, repetitive speech noted, pt is hard of hearing, pt was able to lift spoon and drink to mouth, spoke with daughter today, patient is at her baseline, pt has been afebrile during am shift, bed in lowest and locked position, sbar to be provided to on coming staff.    Problem: Respiratory: Goal: Will maintain a patent airway Outcome: Progressing   Problem: Health Behavior/Discharge Planning: Goal: Ability to manage health-related needs will improve Outcome: Progressing   Problem: Safety: Goal: Ability to remain free from injury will improve Outcome: Progressing

## 2020-11-25 NOTE — Evaluation (Signed)
Physical Therapy Evaluation Patient Details Name: Jennifer [name] MRN: [id number] DOB: May 14, 1922 Today's Date: 11/25/2020  History of Present Illness  87 y.o. F admitted on 11/12 due to increased confusion and COVID+. PMH significant of Dementia, CAD, COPD, and HLD.  Clinical Impression  Patient presents with mobility likely close to functional baseline.  She is minimally aware or able to follow commands due to advanced dementia.  She needed assist for all aspects of care at facility.  Continues to require total a for mobility and ADL's.  Feel return to facility without follow up PT is appropriate.  PT to sign off as pt at baseline and has level of care needed at facility.        Recommendations for follow up therapy are one component of a multi-disciplinary discharge planning process, led by the attending physician.  Recommendations may be updated based on patient status, additional functional criteria and insurance authorization.  Follow Up Recommendations Long-term institutional care without follow-up therapy    Assistance Recommended at Discharge    Functional Status Assessment Patient has not had a recent decline in their functional status  Equipment Recommendations  None recommended by PT    Recommendations for Other Services       Precautions / Restrictions Precautions Precautions: Fall      Mobility  Bed Mobility Overal bed mobility: Needs Assistance             General bed mobility comments: pulling up to sit with HOB up and bed in semi chair position, pt not assisting    Transfers                        Ambulation/Gait                  Stairs            Wheelchair Mobility    Modified Rankin (Stroke Patients Only)       Balance                                             Pertinent Vitals/Pain Breathing: occasional labored breathing, short period of hyperventilation Negative Vocalization: occasional  moan/groan, low speech, negative/disapproving quality Facial Expression: smiling or inexpressive Body Language: tense, distressed pacing, fidgeting Consolability: distracted or reassured by voice/touch PAINAD Score: 4    Home Living Family/patient expects to be discharged to:: Skilled nursing facility                   Additional Comments: From [facility]    Prior Function Prior Level of Function : Needs assist;Patient poor historian/Family not available             Mobility Comments: Per chart pt is mostly bed bound, dependent on staff for transfers ADLs Comments: Per chart, pt is dependent for all ADL's     Hand Dominance   Dominant Hand: Right    Extremity/Trunk Assessment   Upper Extremity Assessment Upper Extremity Assessment: Defer to OT evaluation    Lower Extremity Assessment Lower Extremity Assessment: RLE deficits/detail;LLE deficits/detail;Difficult to assess due to impaired cognition RLE Deficits / Details: PROM/AAROM generally WFL testing in supine, strength testing difficult due to cognition LLE Deficits / Details: PROM/AAROM generally WFL testing in supine, strength testing difficult due to cognition    Cervical / Trunk Assessment Cervical / Trunk  Assessment: Other exceptions Cervical / Trunk Exceptions: cervical R rotation and R lateral flexion, able to bring to neutral, but she does not keep head up  Communication   Communication: Expressive difficulties;Receptive difficulties (limited response to commands and minimal verbalizations)  Cognition Arousal/Alertness: Lethargic Behavior During Therapy: Flat affect Overall Cognitive Status: History of cognitive impairments - at baseline                                 General Comments: Pt has advanced dementia        General Comments General comments (skin integrity, edema, etc.): VSS, pt drinking from straw when presented to her mouth, did squeeze L hand to command after  drinking    Exercises Other Exercises Other Exercises: PROM/AAROM x 4 extremities in supine 5-10 reps   Assessment/Plan    PT Assessment Patient does not need any further PT services  PT Problem List         PT Treatment Interventions      PT Goals (Current goals can be found in the Care Plan section)  Acute Rehab PT Goals PT Goal Formulation: All assessment and education complete, DC therapy    Frequency     Barriers to discharge        Co-evaluation               AM-PAC PT "6 Clicks" Mobility  Outcome Measure Help needed turning from your back to your side while in a flat bed without using bedrails?: Total Help needed moving from lying on your back to sitting on the side of a flat bed without using bedrails?: Total Help needed moving to and from a bed to a chair (including a wheelchair)?: Total Help needed standing up from a chair using your arms (e.g., wheelchair or bedside chair)?: Total Help needed to walk in hospital room?: Total Help needed climbing 3-5 steps with a railing? : Total 6 Click Score: 6    End of Session   Activity Tolerance: Patient limited by lethargy Patient left: in bed;with call bell/phone within reach   PT Visit Diagnosis: Muscle weakness (generalized) (M62.81)    Time: 1093-2355 PT Time Calculation (min) (ACUTE ONLY): 17 min   Charges:   PT Evaluation $PT Eval Moderate Complexity: 1 Mod          Jennifer [name], PT Acute Rehabilitation Services Pager:[phone] [phone] 11/25/2020   Elray Mcgregor 11/25/2020, 12:29 PM

## 2020-11-26 MED ORDER — NIRMATRELVIR/RITONAVIR (PAXLOVID)TABLET: 3.0000 | ORAL_TABLET | Freq: Two times a day (BID) | ORAL | 0 refills | Status: AC

## 2020-11-26 MED ORDER — TRAMADOL HCL 50 MG PO TABS: 50.0000 mg | ORAL_TABLET | ORAL | 0 refills | Status: DC

## 2020-11-26 NOTE — Progress Notes (Signed)
Phoned facility to provide report, on hold with no response for greater than 10 minutes. Will try again later.

## 2020-11-26 NOTE — Progress Notes (Signed)
Sbar provided to Taffy, RN DON, all questions and concerns addressed, pt awaiting ptar for transport to facility.

## 2020-11-26 NOTE — NC FL2 (Signed)
[place] MEDICAID FL2 LEVEL OF CARE SCREENING TOOL     IDENTIFICATION  Patient Name: Jennifer [name] Birthdate: Sep 25, 1922 Sex: female Admission Date (Current Location): 11/23/2020  [facility] and [place] Number:  Producer, television/film/video and Address:  [facility], [address] [place], [place] [address]      Provider Number: [id number]  Attending Physician Name and Address:  Maretta Bees, MD  Relative Name and Phone Number:  [name] [phone]    Current Level of Care: Hospital Recommended Level of Care: Skilled Nursing Facility Prior Approval Number:    Date Approved/Denied:   PASRR Number:    Discharge Plan: SNF    Current Diagnoses: Patient Active Problem List   Diagnosis Date Noted   Encephalopathy 11/24/2020   Acute encephalopathy 11/23/2020   COVID-19 virus infection 11/23/2020   UTI due to extended-spectrum beta lactamase (ESBL) producing Escherichia coli 09/30/2018   Lower urinary tract infectious disease 09/28/2018   Elevated blood pressure, situational 04/26/2018   Positive blood culture 04/26/2018   Shingles (herpes zoster) polyneuropathy 03/11/2018   Advanced care planning/counseling discussion    Palliative care by specialist    Goals of care, counseling/discussion    Altered mental status 01/24/2018   Frequent falls 01/24/2018   Burnout of caregiver 07/21/2016   DNR no code (do not resuscitate) 04/25/2014   HTN (hypertension) 04/22/2014   Urinary incontinence 04/04/2013   Osteoporosis, post-menopausal 12/06/2012   At high risk for falls 07/30/2012   Dementia without behavioral disturbance (HCC) 08/19/2011   COPD (chronic obstructive pulmonary disease) (HCC) 06/25/2011   Allergic rhinitis 12/23/2010   CEREBROVASCULAR DISEASE 08/05/2009   Spinal stenosis 06/09/2007   Coronary atherosclerosis 01/25/2007   HIATAL HERNIA WITH REFLUX 01/25/2007   Osteoarthritis 01/25/2007    Orientation RESPIRATION BLADDER Height &  Weight     Self  Normal Incontinent, External catheter (External Urinary Catheter) Weight: 141 lb 8.6 oz (64.2 kg) Height:  5\' 5"  (165.1 cm)  BEHAVIORAL SYMPTOMS/MOOD NEUROLOGICAL BOWEL NUTRITION STATUS      Incontinent Diet (Please see discharge summary)  AMBULATORY STATUS COMMUNICATION OF NEEDS Skin   Total Care Verbally Other (Comment) (appropriate for ethnicity,dry,intact,avulsion,sacrum,medial,foam)                       Personal Care Assistance Level of Assistance  Bathing, Feeding, Dressing Bathing Assistance: Maximum assistance Feeding assistance: Maximum assistance Dressing Assistance: Maximum assistance     Functional Limitations Info  Sight, Speech, Hearing Sight Info: Impaired Hearing Info: Impaired Speech Info: Adequate    SPECIAL CARE FACTORS FREQUENCY                       Contractures Contractures Info: Not present    Additional Factors Info  Code Status, Allergies, Isolation Precautions Code Status Info: DNR Allergies Info: Aricept (donepezil Hcl),Iohexol,Iron,Penicillins     Isolation Precautions Info: Covid + onset 11/23/20     Current Medications (11/26/2020):  This is the current hospital active medication list Current Facility-Administered Medications  Medication Dose Route Frequency Provider Last Rate Last Admin   0.9 %  sodium chloride infusion   Intravenous PRN Ghimire, 11/28/2020, MD       acetaminophen (TYLENOL) tablet 650 mg  650 mg Oral Q6H PRN Werner Lean, MD   650 mg at 11/24/20 2324   Or   acetaminophen (TYLENOL) suppository 650 mg  650 mg Rectal Q6H PRN 2325, MD  cefTRIAXone (ROCEPHIN) 1 g in sodium chloride 0.9 % 100 mL IVPB  1 g Intravenous Q24H Maretta Bees, MD 200 mL/hr at 11/25/20 1840 1 g at 11/25/20 1840   enoxaparin (LOVENOX) injection 40 mg  40 mg Subcutaneous Q24H Doutova, Anastassia, MD   40 mg at 11/26/20 0931   MEDLINE mouth rinse  15 mL Mouth Rinse BID Maretta Bees, MD    15 mL at 11/26/20 9150   nirmatrelvir/ritonavir EUA (PAXLOVID) 3 tablet  3 tablet Oral BID Therisa Doyne, MD   3 tablet at 11/26/20 5697     Discharge Medications: Please see discharge summary for a list of discharge medications.  Relevant Imaging Results:  Relevant Lab Results:   Additional Information [id number]  Delilah Shan, LCSWA

## 2020-11-26 NOTE — TOC Transition Note (Addendum)
Transition of Care [facility]) - CM/SW Discharge Note   Patient Details  Name: Jennifer [name] MRN: [id number] Date of Birth: 1922/12/16  [facility] of [facility]) CM/SW Contact:  Delilah Shan, LCSWA Phone Number: 11/26/2020, 12:24 PM   Clinical Narrative:     Patient will DC to: [facility]   Anticipated DC date: 11/26/2020   Family notified: Gwyndolyn   Transport by: Sharin Mons  ?  Per MD patient ready for DC to [facility] . RN, patient, patient's family, and facility notified of DC. Discharge Summary sent to facility. RN given number for report tele# [phone] RM#[phone]. DC packet on chart. DNR signed by MD attached to patients DC packet. Ambulance transport requested for patient.  CSW signing off.   Final next level of care: Skilled Nursing Facility Barriers to Discharge: No Barriers Identified   Patient Goals and CMS Choice Patient states their goals for this hospitalization and ongoing recovery are:: Pt unable to participate in discharge planning due to disorientation. CMS Medicare.gov Compare Post Acute Care list provided to:: Patient Represenative (must comment) (Patients daughter [name]) Choice offered to / list presented to : Adult Children (Patients daughter [name])  Discharge Placement              Patient chooses bed at: [facility] Patient to be transferred to facility by: [facility] Name of family member notified: Gwendolyn Patient and family notified of of transfer: 11/26/20  Discharge Plan and Services                                     Social Determinants of Health (SDOH) Interventions     Readmission Risk Interventions No flowsheet data found.

## 2020-11-26 NOTE — Discharge Summary (Signed)
PATIENT DETAILS Name: Jennifer [name] Age: 87 y.o. Sex: female Date of Birth: 09-17-1922 MRN: [id number]. Admitting Physician: Maretta Bees, MD PCP:Pcp, No  Admit Date: 11/23/2020 Discharge date: 11/26/2020  Recommendations for Outpatient Follow-up:  Follow up with PCP in 1-2 weeks Please obtain CMP/CBC in one week  Admitted From:  SNF  Disposition: SNF   Home Health: No  Equipment/Devices: None  Discharge Condition: Stable  CODE STATUS: DNR  Diet recommendation:  Diet Order             Diet - low sodium heart healthy           DIET - DYS 1 Room service appropriate? Yes; Fluid consistency: Thin  Diet effective now                    Brief Summary: Patient is a 87 y.o. female with history of advanced dementia, COPD, HLD, CAD-who presented with worsening confusion than her usual baseline-she was found to have COVID-19 infection.  Brief Hospital Course: Acute metabolic encephalopathy: Due to COVID-19 infection/UTI-has advanced dementia at baseline-suspect mental status is close to baseline-no family at bedside to collaborate.  However today-she is much more awake and alert compared to the past few days and was actually able to tell me her name.   COVID-19 infection: Not hypoxic-no pneumonia on CXR-continue Paxlovid x 5 days total   UTI: Unable to discern whether she actually has symptoms (advanced dementia)-continue Rocephin x3 days-unfortunately urine cultures were never sent-doubt any utility of sending cultures at this point as patient already on antimicrobial therapy.   Dementia: Appears to be advanced-maintain delirium precautions.   Frailty/failure to thrive syndrome/functional quadriplegia: Per patient's daughter-very poor oral intake for past 3 weeks-improved after symptomatic treatment and with antiviral agents and antibiotics-she is much more awake and alert.  She would benefit from having palliative care follow-up at SNF.      BMI Estimated body mass index is 23.55 kg/m as calculated from the following:   Height as of this encounter: 5\' 5"  (1.651 m).   Weight as of this encounter: 64.2 kg.    Procedures None  Discharge Diagnoses:  Active Problems:   COPD (chronic obstructive pulmonary disease) (HCC)   Dementia without behavioral disturbance (HCC)   HTN (hypertension)   Acute encephalopathy   COVID-19 virus infection   Encephalopathy   Discharge Instructions:  Activity:  As tolerated with Full fall precautions use walker/cane & assistance as needed   Discharge Instructions     Diet - low sodium heart healthy   Complete by: As directed    Increase activity slowly   Complete by: As directed       Allergies as of 11/26/2020       Reactions   Aricept [donepezil Hcl] Other (See Comments)   confusion   Iohexol     Desc: PT ALLERGIC TO CONTRAST- SHE CAN'T BREATH   Iron    Penicillins    Has patient had a PCN reaction causing immediate rash, facial/tongue/throat swelling, SOB or lightheadedness with hypotension:  unknown Has patient had a PCN reaction causing severe rash involving mucus membranes or skin necrosis: unknown Has patient had a PCN reaction that required hospitalization unknown Has patient had a PCN reaction occurring within the last 10 years: unknown If all of the above answers are "NO", then may proceed with Cephalosporin use.        Medication List     STOP taking these medications  gabapentin 100 MG capsule Commonly known as: NEURONTIN       TAKE these medications    acetaminophen 325 MG tablet Commonly known as: TYLENOL Take 650 mg by mouth 3 (three) times daily.   Cranberry 500 MG Tabs Take 500 mg by mouth daily.   diclofenac sodium 1 % Gel Commonly known as: VOLTAREN Apply to both knees QID PRN What changed:  how much to take how to take this when to take this additional instructions   feeding supplement (PRO-STAT SUGAR FREE 64) Liqd Take 30  mLs by mouth in the morning and at bedtime.   FORTIFY PROBIOTIC WOMENS PO Take 1 capsule by mouth daily.   geriatric multivitamins-minerals Liqd Take 15 mLs by mouth daily.   Imvexxy Maintenance Pack 4 MCG Inst Generic drug: Estradiol Place 4 mcg vaginally 2 (two) times a week. Monday's and Thursday's   lidocaine 5 % Commonly known as: LIDODERM Place 1 patch onto the skin daily. Remove & Discard patch within 12 hours or as directed by MD What changed: additional instructions   nirmatrelvir/ritonavir EUA 20 x 150 MG & 10 x  Tabs Commonly known as: PAXLOVID Take 3 tablets by mouth 2 (two) times daily for 4 doses. Take nirmatrelvir (150 mg) two tablets twice daily for 5 days and ritonavir (100 mg) one tablet twice daily for 5 days.last dose on 11/17   Olopatadine HCl 0.2 % Soln Place 1 drop into both eyes daily.   OVER THE COUNTER MEDICATION Apply 1 application topically See admin instructions. House barrier cream - apply to buttocks every shift and as needed with each incontinence episode   OVER THE COUNTER MEDICATION Take 1 Container by mouth every evening. Magic Cup   OVER THE COUNTER MEDICATION Take 1 Bottle by mouth with breakfast, with lunch, and with evening meal. Mighty Shake   oxybutynin 5 MG 24 hr tablet Commonly known as: DITROPAN-XL Take 5 mg by mouth at bedtime.   Polyethyl Glycol-Propyl Glycol 0.4-0.3 % Soln Apply 1 drop to eye in the morning and at bedtime.   traMADol 50 MG tablet Commonly known as: ULTRAM Take 1 tablet (50 mg total) by mouth See admin instructions.  twice daily scheduled, as well as  every 6 hours as needed for moderate to severe pain   Vitamin D3 50 MCG (2000 UT) Tabs Take 2,000 Units by mouth daily.   zinc oxide 20 % ointment Apply 1 application topically in the morning, at noon, and at bedtime. To sacrum and buttocks        Follow-up Information     [facility] EMERGENCY DEPARTMENT. Go to .    Specialty: Emergency Medicine Why: As needed, If symptoms worsen Contact information: [address] [id number] mc [place] [address] [phone]        [facility]. Schedule an appointment as soon as possible for a visit in 2 days.   Specialty: Family Medicine Why: If you do not have PCP, please see PCP above, Follow up from ER visit Contact information: [address] [place] [address] [phone]                Allergies  Allergen Reactions   Aricept [Donepezil Hcl] Other (See Comments)    confusion   Iohexol      Desc: PT ALLERGIC TO CONTRAST- SHE CAN'T BREATH    Iron    Penicillins     Has patient had a PCN reaction causing  immediate rash, facial/tongue/throat swelling, SOB or lightheadedness with hypotension:  unknown Has patient had a PCN reaction causing severe rash involving mucus membranes or skin necrosis: unknown Has patient had a PCN reaction that required hospitalization unknown Has patient had a PCN reaction occurring within the last 10 years: unknown If all of the above answers are "NO", then may proceed with Cephalosporin use.       Consultations:  None   Other Procedures/Studies: CT HEAD WO CONTRAST ( )  Result Date: 11/23/2020 CLINICAL DATA:  Delirium.  Patient has history of dementia. EXAM: CT HEAD WITHOUT CONTRAST TECHNIQUE: Contiguous axial images were obtained from the base of the skull through the vertex without intravenous contrast. COMPARISON:  September 28, 2018 FINDINGS: Brain: No evidence of acute infarction, hemorrhage, hydrocephalus, extra-axial collection or mass lesion/mass effect. There is chronic diffuse atrophy. Chronic bilateral periventricular white matter small vessel ischemic changes are noted. Vascular: No hyperdense vessel is noted. Skull: Normal. Negative for fracture or focal lesion. Sinuses/Orbits: No acute finding. Other: None. IMPRESSION: 1. No  focal acute intracranial abnormality identified. 2. Chronic diffuse atrophy. Chronic bilateral periventricular white matter small vessel ischemic change. Electronically Signed   By: Sherian Rein M.D.   On: 11/23/2020 14:12   DG Chest Port 1 View  Result Date: 11/23/2020 CLINICAL DATA:  Altered mental status and chest pain. EXAM: PORTABLE CHEST 1 VIEW COMPARISON:  September 28, 2018 FINDINGS: The heart size and mediastinal contours are stable. Both lungs are clear. Calcified granulomas are identified in the right lung unchanged. Chronic elevation of right hemidiaphragm is unchanged. The visualized skeletal structures are stable. IMPRESSION: No active disease. Electronically Signed   By: Sherian Rein M.D.   On: 11/23/2020 13:27     TODAY-DAY OF DISCHARGE:  Subjective:   Jennifer [name] today has no headache,no chest abdominal pain,no new weakness tingling or numbness, feels much better wants to go home today.   Objective:   Blood pressure 116/78, pulse 74, temperature 98.3 F (36.8 C), temperature source Axillary, resp. rate 17, height 5\' 5"  (1.651 m), weight 64.2 kg, SpO2 98 %.  Intake/Output Summary (Last 24 hours) at 11/26/2020 1053 Last data filed at 11/26/2020 0929 Gross per 24 hour  Intake 220 ml  Output 150 ml  Net 70 ml   Filed Weights   11/23/20 1800 11/23/20 2103  Weight: 80.7 kg 64.2 kg    Exam: Awake Alert, Oriented *3, No new F.N deficits, Normal affect [place].AT,PERRAL Supple Neck,No JVD, No cervical lymphadenopathy appriciated.  Symmetrical Chest wall movement, Good air movement bilaterally, CTAB RRR,No Gallops,Rubs or new Murmurs, No Parasternal Heave +ve B.Sounds, Abd Soft, Non tender, No organomegaly appriciated, No rebound -guarding or rigidity. No Cyanosis, Clubbing or edema, No new Rash or bruise   PERTINENT RADIOLOGIC STUDIES: No results found.   PERTINENT LAB RESULTS: CBC: Recent Labs    11/23/20 1301 11/24/20 0831  WBC 5.7 4.8  HGB 11.9* 12.7   HCT 39.5 41.5  PLT 177 150   CMET CMP     Component Value Date/Time   NA 138 11/24/2020 0831   K 3.9 11/24/2020 0831   CL 105 11/24/2020 0831   CO2 25 11/24/2020 0831   GLUCOSE 80 11/24/2020 0831   BUN 10 11/24/2020 0831   CREATININE 0.60 11/24/2020 0831   CREATININE 0.93 (H) 05/06/2017 1432   CALCIUM 8.4 (L) 11/24/2020 0831   PROT 6.3 (L) 11/24/2020 0831   ALBUMIN 2.7 (L) 11/24/2020 0831   AST 20 11/24/2020 0831   ALT  11 11/24/2020 0831   ALKPHOS 120 11/24/2020 0831   BILITOT 0.7 11/24/2020 0831   GFRNONAA >60 11/24/2020 0831   GFRNONAA 53 (L) 05/06/2017 1432   GFRAA >60 09/30/2018 0554   GFRAA 61 05/06/2017 1432    GFR Estimated Creatinine Clearance: 37 mL/min (by C-G formula based on SCr of 0.6 mg/dL). No results for input(s): LIPASE, AMYLASE in the last 72 hours. Recent Labs    11/23/20 1301  CKTOTAL 28*   Invalid input(s): POCBNP Recent Labs    11/23/20 2134  DDIMER 0.86*   No results for input(s): HGBA1C in the last 72 hours. No results for input(s): CHOL, HDL, LDLCALC, TRIG, CHOLHDL, LDLDIRECT in the last 72 hours. Recent Labs    11/24/20 0831  TSH 0.844   No results for input(s): VITAMINB12, FOLATE, FERRITIN, TIBC, IRON, RETICCTPCT in the last 72 hours. Coags: No results for input(s): INR in the last 72 hours.  Invalid input(s): PT Microbiology: Recent Results (from the past 240 hour(s))  Resp Panel by RT-PCR (Flu A&B, Covid) Nasopharyngeal Swab     Status: Abnormal   Collection Time: 11/23/20  1:09 PM   Specimen: Nasopharyngeal Swab; Nasopharyngeal(NP) swabs in vial transport medium  Result Value Ref Range Status   SARS Coronavirus 2 by RT PCR POSITIVE (A) NEGATIVE Final    Comment: CRITICAL RESULT CALLED TO, READ BACK BY AND VERIFIED WITH: A. OLEARY,RN 11/23/2020 1748 BY PWHITE. (NOTE) SARS-CoV-2 target nucleic acids are DETECTED.  The SARS-CoV-2 RNA is generally detectable in upper respiratory specimens during the acute phase of  infection. Positive results are indicative of the presence of the identified virus, but do not rule out bacterial infection or co-infection with other pathogens not detected by the test. Clinical correlation with patient history and other diagnostic information is necessary to determine patient infection status. The expected result is Negative.  Fact Sheet for Patients: BloggerCourse.com  Fact Sheet for Healthcare Providers: SeriousBroker.it  This test is not yet approved or cleared by the [place] FDA and  has been authorized for detection and/or diagnosis of SARS-CoV-2 by FDA under an Emergency Use Authorization (EUA).  This EUA will remain in effect (meaning t his test can be used) for the duration of  the COVID-19 declaration under Section 564(b)(1) of the Act, 21 U.S.C. section 360bbb-3(b)(1), unless the authorization is terminated or revoked sooner.     Influenza A by PCR NEGATIVE NEGATIVE Final   Influenza B by PCR NEGATIVE NEGATIVE Final    Comment: (NOTE) The Xpert Xpress SARS-CoV-2/FLU/RSV plus assay is intended as an aid in the diagnosis of influenza from Nasopharyngeal swab specimens and should not be used as a sole basis for treatment. Nasal washings and aspirates are unacceptable for Xpert Xpress SARS-CoV-2/FLU/RSV testing.  Fact Sheet for Patients: BloggerCourse.com  Fact Sheet for Healthcare Providers: SeriousBroker.it  This test is not yet approved or cleared by the [place] FDA and has been authorized for detection and/or diagnosis of SARS-CoV-2 by FDA under an Emergency Use Authorization (EUA). This EUA will remain in effect (meaning this test can be used) for the duration of the COVID-19 declaration under Section 564(b)(1) of the Act, 21 U.S.C. section 360bbb-3(b)(1), unless the authorization is terminated or revoked.  Performed at [facility], [address] [place], [place] [address]   MRSA Next Gen by PCR, Nasal     Status: None   Collection Time: 11/23/20  9:50 PM   Specimen: Nasal Mucosa; Nasal Swab  Result Value Ref  Range Status   MRSA by PCR Next Gen NOT DETECTED NOT DETECTED Final    Comment: (NOTE) The GeneXpert MRSA Assay (FDA approved for NASAL specimens only), is one component of a comprehensive MRSA colonization surveillance program. It is not intended to diagnose MRSA infection nor to guide or monitor treatment for MRSA infections. Test performance is not FDA approved in patients less than 10 years old. Performed at [facility], [address] [place], [place] [address]     FURTHER DISCHARGE INSTRUCTIONS:  Get Medicines reviewed and adjusted: Please take all your medications with you for your next visit with your Primary MD  Laboratory/radiological data: Please request your Primary MD to go over all hospital tests and procedure/radiological results at the follow up, please ask your Primary MD to get all Hospital records sent to his/her office.  In some cases, they will be blood work, cultures and biopsy results pending at the time of your discharge. Please request that your primary care M.D. goes through all the records of your hospital data and follows up on these results.  Also Note the following: If you experience worsening of your admission symptoms, develop shortness of breath, life threatening emergency, suicidal or homicidal thoughts you must seek medical attention immediately by calling 911 or calling your MD immediately  if symptoms less severe.  You must read complete instructions/literature along with all the possible adverse reactions/side effects for all the Medicines you take and that have been prescribed to you. Take any new Medicines after you have completely understood and accpet all the possible adverse reactions/side effects.   Do not drive when taking Pain medications or  sleeping medications (Benzodaizepines)  Do not take more than prescribed Pain, Sleep and Anxiety Medications. It is not advisable to combine anxiety,sleep and pain medications without talking with your primary care practitioner  Special Instructions: If you have smoked or chewed Tobacco  in the last 2 yrs please stop smoking, stop any regular Alcohol  and or any Recreational drug use.  Wear Seat belts while driving.  Please note: You were cared for by a hospitalist during your hospital stay. Once you are discharged, your primary care physician will handle any further medical issues. Please note that NO REFILLS for any discharge medications will be authorized once you are discharged, as it is imperative that you return to your primary care physician (or establish a relationship with a primary care physician if you do not have one) for your post hospital discharge needs so that they can reassess your need for medications and monitor your lab values.  Total Time spent coordinating discharge including counseling, education and face to face time equals 35 minutes.  SignedJeoffrey Massed 11/26/2020 10:53 AM

## 2020-11-26 NOTE — Progress Notes (Addendum)
Spoke with daughter and facility for confirmation on 11/14.

## 2020-11-26 NOTE — Progress Notes (Signed)
Pt currently lying in bed, no pain or discomfort reported, pt has active discharge, awaiting ptar transportation, piv and rectal pouch have been removed, report was called in at 1414, discharge paperwork on chart, sbar to be provided to on coming nurse.

## 2021-01-10 ENCOUNTER — Other Ambulatory Visit: Payer: Self-pay

## 2021-01-10 ENCOUNTER — Emergency Department (HOSPITAL_COMMUNITY): Payer: Medicare Other

## 2021-01-10 ENCOUNTER — Emergency Department (HOSPITAL_COMMUNITY)
Admission: EM | Admit: 2021-01-10 | Discharge: 2021-01-10 | Disposition: A | Discharge status: Home / Self Care | Payer: Medicare Other | Attending: Emergency Medicine | Admitting: Emergency Medicine

## 2021-01-10 DIAGNOSIS — F03911 Unspecified dementia, unspecified severity, with agitation: Secondary | ICD-10-CM | POA: Insufficient documentation

## 2021-01-10 DIAGNOSIS — J449 Chronic obstructive pulmonary disease, unspecified: Secondary | ICD-10-CM | POA: Diagnosis not present

## 2021-01-10 DIAGNOSIS — G252 Other specified forms of tremor: Secondary | ICD-10-CM | POA: Diagnosis not present

## 2021-01-10 DIAGNOSIS — Z8616 Personal history of COVID-19: Secondary | ICD-10-CM | POA: Insufficient documentation

## 2021-01-10 DIAGNOSIS — Z96652 Presence of left artificial knee joint: Secondary | ICD-10-CM | POA: Insufficient documentation

## 2021-01-10 DIAGNOSIS — I2510 Atherosclerotic heart disease of native coronary artery without angina pectoris: Secondary | ICD-10-CM | POA: Insufficient documentation

## 2021-01-10 DIAGNOSIS — R413 Other amnesia: Secondary | ICD-10-CM | POA: Diagnosis present

## 2021-01-10 DIAGNOSIS — Z87891 Personal history of nicotine dependence: Secondary | ICD-10-CM | POA: Diagnosis not present

## 2021-01-10 DIAGNOSIS — G514 Facial myokymia: Secondary | ICD-10-CM | POA: Insufficient documentation

## 2021-01-10 DIAGNOSIS — I10 Essential (primary) hypertension: Secondary | ICD-10-CM | POA: Insufficient documentation

## 2021-01-10 LAB — URINALYSIS, ROUTINE W REFLEX MICROSCOPIC
Bacteria, UA: NONE SEEN
Bilirubin Urine: NEGATIVE
Glucose, UA: NEGATIVE mg/dL
Ketones, ur: NEGATIVE mg/dL
Leukocytes,Ua: NEGATIVE
Nitrite: NEGATIVE
Protein, ur: NEGATIVE mg/dL
Specific Gravity, Urine: 1.026 (ref 1.005–1.030)
pH: 5 (ref 5.0–8.0)

## 2021-01-10 LAB — CBC
HCT: 36.8 % (ref 36.0–46.0)
Hemoglobin: 11.1 g/dL — ABNORMAL LOW (ref 12.0–15.0)
MCH: 28.2 pg (ref 26.0–34.0)
MCHC: 30.2 g/dL (ref 30.0–36.0)
MCV: 93.4 fL (ref 80.0–100.0)
Platelets: 162 10*3/uL (ref 150–400)
RBC: 3.94 MIL/uL (ref 3.87–5.11)
RDW: 17.4 % — ABNORMAL HIGH (ref 11.5–15.5)
WBC: 4.8 10*3/uL (ref 4.0–10.5)
nRBC: 0 % (ref 0.0–0.2)

## 2021-01-10 LAB — COMPREHENSIVE METABOLIC PANEL
ALT: 9 U/L (ref 0–44)
AST: 18 U/L (ref 15–41)
Albumin: 2.8 g/dL — ABNORMAL LOW (ref 3.5–5.0)
Alkaline Phosphatase: 97 U/L (ref 38–126)
Anion gap: 5 (ref 5–15)
BUN: 20 mg/dL (ref 8–23)
CO2: 30 mmol/L (ref 22–32)
Calcium: 8.5 mg/dL — ABNORMAL LOW (ref 8.9–10.3)
Chloride: 107 mmol/L (ref 98–111)
Creatinine, Ser: 0.68 mg/dL (ref 0.44–1.00)
GFR, Estimated: >60 mL/min (ref >60)
Glucose, Bld: 78 mg/dL (ref 70–99)
Potassium: 4.1 mmol/L (ref 3.5–5.1)
Sodium: 142 mmol/L (ref 135–145)
Total Bilirubin: 0.5 mg/dL (ref 0.3–1.2)
Total Protein: 6.2 g/dL — ABNORMAL LOW (ref 6.5–8.1)

## 2021-01-10 MED ORDER — CEPHALEXIN 500 MG PO CAPS: 500.0000 mg | ORAL_CAPSULE | Freq: Four times a day (QID) | ORAL | 0 refills | Status: DC

## 2021-01-10 MED ORDER — SODIUM CHLORIDE 0.9 % IV SOLN
1.0000 g | Freq: Once | INTRAVENOUS | Status: AC
  Administered 2021-01-10: 12:00:00 1 g via INTRAVENOUS
  Filled 2021-01-10: qty 10

## 2021-01-10 NOTE — ED Notes (Signed)
Pt wheeled to [employer] . Pts daughter verbalized understanding of discharge instructions.

## 2021-01-10 NOTE — ED Notes (Signed)
Pure wick was placed on patient, with suction on 60.

## 2021-01-10 NOTE — Discharge Instructions (Addendum)
No definite cause of worsening of dementia with tremors and agitation noted on work-up today.  However urine is being sent for culture.  She is being treated empirically with a dose of Rocephin here in the emergency department and prescription for Keflex. Please check my chart in 48 to 72 hours.  If culture is negative, antibiotics can be stopped If it is positive, please check for susceptibility to keflex Return to the emergency department if worsening at any time especially altered mental status, high fevers, or inability to tolerate fluids or pills

## 2021-01-10 NOTE — ED Provider Notes (Signed)
[name] EMERGENCY DEPARTMENT Provider Note   CSN: [id number] Arrival date & time: 01/10/21  5456     History Chief Complaint  Patient presents with   Possible UTI    Jennifer [name] is a 87 y.o. female.  HPI Level 5 caveat-patient with memory issues and.  Hard of hearing I am unable to obtain history from speaking with her. History obtained from nurse, via EMS, via skilled nursing facility staff Additional history is obtained via phone with daughter Recent discharge from hospital discharge summary reviewed 87 year old female history of memory problems, COPD, coronary artery disease, multiple UTIs, with reports of an episode of "twitching this morning.  Reported is twitching on the right side of her face.  She was reportedly unresponsive during this episode.  Is unclear how long it lasted.  Daughter reports that previously this was associated with UTI in the last several times.     Past Medical History:  Diagnosis Date   Anemia    iron deficiency post op.   CAD (coronary artery disease) 2008   Cath 40% mid LAD, 35% RCA   Cellulitis of finger    right    Chest pain 2006   Chronic back pain    COPD (chronic obstructive pulmonary disease) (HCC)    Gastroesophageal reflux disease    Hiatal hernia    Hyperlipidemia    Memory deficit    Nicotine addiction    Osteoarthritis    status post left TKA; surgery on the right is anticipated in the near future    Pulmonary nodules    stable since 2006   Shingles    right breast    Skin infection    Ulcer    gential   Use of cane as ambulatory aid     Patient Active Problem List   Diagnosis Date Noted   Encephalopathy 11/24/2020   Acute encephalopathy 11/23/2020   COVID-19 virus infection 11/23/2020   UTI due to extended-spectrum beta lactamase (ESBL) producing Escherichia coli 09/30/2018   Lower urinary tract infectious disease 09/28/2018   Elevated blood pressure, situational 04/26/2018   Positive  blood culture 04/26/2018   Shingles (herpes zoster) polyneuropathy 03/11/2018   Advanced care planning/counseling discussion    Palliative care by specialist    Goals of care, counseling/discussion    Altered mental status 01/24/2018   Frequent falls 01/24/2018   Burnout of caregiver 07/21/2016   DNR no code (do not resuscitate) 04/25/2014   HTN (hypertension) 04/22/2014   Urinary incontinence 04/04/2013   Osteoporosis, post-menopausal 12/06/2012   At high risk for falls 07/30/2012   Dementia without behavioral disturbance ([place]) 08/19/2011   COPD (chronic obstructive pulmonary disease) ([place]) 06/25/2011   Allergic rhinitis 12/23/2010   CEREBROVASCULAR DISEASE 08/05/2009   Spinal stenosis 06/09/2007   Coronary atherosclerosis 01/25/2007   HIATAL HERNIA WITH REFLUX 01/25/2007   Osteoarthritis 01/25/2007    Past Surgical History:  Procedure Laterality Date   ABDOMINAL HYSTERECTOMY     BREAST SURGERY N/A    bilateral   CARDIAC CATHETERIZATION  2008   Mid LAD 40%, RCA 25%.    CHOLECYSTECTOMY     COMBINED HYSTERECTOMY ABDOMINAL W/ A&P REPAIR / OOPHORECTOMY  approx. 76 years ago    [place] 2007    right -1998 / left 2007   right knee replacement  09/2009   Dr. Aline Brochure   TOTAL KNEE ARTHROPLASTY  4/08   left  OB History     Gravida      Para      Term      Preterm      AB      Living  1      SAB      IAB      Ectopic      Multiple      Live Births              Family History  Problem Relation Age of Onset   Cancer Mother 26       breast    Cancer Father 72       prostate   Hypertension Father    Hypertension Sister    Heart disease Sister    Heart disease Sister    Heart disease Sister     Social History   Tobacco Use   Smoking status: Former    Packs/day: 0.25    Years: 70.00    Pack years: 17.50    Types: Cigarettes    Quit date: 03/13/2015    Years since quitting: 5.8   Smokeless tobacco: Never   Vaping Use   Vaping Use: Never used  Substance Use Topics   Alcohol use: No    Alcohol/week: 0.0 standard drinks   Drug use: No    Home Medications Prior to Admission medications   Medication Sig Start Date End Date Taking? Authorizing Provider  cephALEXin (KEFLEX) 500 MG capsule Take 1 capsule (500 mg total) by mouth 4 (four) times daily. 01/10/21  Yes Pattricia Boss, MD  acetaminophen (TYLENOL) 325 MG tablet Take 650 mg by mouth 3 (three) times daily.    [provider]  Amino Acids-Protein Hydrolys (FEEDING SUPPLEMENT, PRO-STAT SUGAR FREE 64,) LIQD Take 30 mLs by mouth in the morning and at bedtime.    [provider]  Cholecalciferol (VITAMIN D3) 50 MCG (2000 UT) TABS Take 2,000 Units by mouth daily.    [provider]  Cranberry 500 MG TABS Take 500 mg by mouth daily.    [provider]  diclofenac sodium (VOLTAREN) 1 % GEL Apply to both knees QID PRN Patient taking differently: Apply 4 g topically 4 (four) times daily. Apply to both knees 04/29/18   Barton Dubois, MD  Estradiol [facility] MAINTENANCE PACK) 4 MCG INST Place 4 mcg vaginally 2 (two) times a week. Monday's and Thursday's    [provider]  geriatric multivitamins-minerals (ELDERTONIC/GEVRABON) LIQD Take 15 mLs by mouth daily.    [provider]  lidocaine (LIDODERM) 5 % Place 1 patch onto the skin daily. Remove & Discard patch within 12 hours or as directed by MD Patient taking differently: Place 1 patch onto the skin daily. Apply to lower back. Remove & Discard patch within 12 hours or as directed by MD 03/02/18   Fayrene Helper, MD  Olopatadine HCl 0.2 % SOLN Place 1 drop into both eyes daily.    [provider]  OVER THE COUNTER MEDICATION Apply 1 application topically See admin instructions. House barrier cream - apply to buttocks every shift and as needed with each incontinence episode    [provider]  OVER THE COUNTER MEDICATION Take 1  Container by mouth every evening. Social research officer, government, Historical, MD  OVER THE COUNTER MEDICATION Take 1 Bottle by mouth with breakfast, with lunch, and with evening meal. Mighty Shake    [provider]  oxybutynin (DITROPAN-XL) 5 MG 24 hr  tablet Take 5 mg by mouth at bedtime.    [provider]  Polyethyl Glycol-Propyl Glycol 0.4-0.3 % SOLN Apply 1 drop to eye in the morning and at bedtime.    [provider]  Probiotic Product (FORTIFY PROBIOTIC WOMENS PO) Take 1 capsule by mouth daily.    [provider]  traMADol (ULTRAM) 50 MG tablet Take 1 tablet (50 mg total) by mouth See admin instructions. 39m twice daily scheduled, as well as 554mevery 6 hours as needed for moderate to severe pain 11/26/20   GhJonetta OsgoodMD  zinc oxide 20 % ointment Apply 1 application topically in the morning, at noon, and at bedtime. To sacrum and buttocks    [provider]    Allergies    Aricept [donepezil hcl], Iohexol, Iron, and Penicillins  Review of Systems   Review of Systems  All other systems reviewed and are negative.  Physical Exam Updated Vital Signs BP (!) 156/90    Pulse (!) 58    Temp 97.7 F (36.5 C) (Rectal)    Resp 16    Ht 1.676 m (_0 )    Wt 81.6 kg    SpO2 97%    BMI 29.05 kg/m   Physical Exam Vitals and nursing note reviewed.  Constitutional:      Appearance: Normal appearance.  HENT:     Head: Normocephalic.     Right Ear: External ear normal.     Left Ear: External ear normal.     Nose: Nose normal.     Mouth/Throat:     Mouth: Mucous membranes are moist.     Pharynx: Oropharynx is clear.  Eyes:     Pupils: Pupils are equal, round, and reactive to light.     Comments: Ectropion noted bilaterally  Cardiovascular:     Rate and Rhythm: Normal rate and regular rhythm.     Pulses: Normal pulses.  Pulmonary:     Effort: Pulmonary effort is normal.     Breath sounds: Normal breath sounds.  Abdominal:     General:  Abdomen is flat. There is no distension.     Palpations: Abdomen is soft.  Musculoskeletal:        General: Normal range of motion.     Cervical back: Normal range of motion.     Comments: Right leg is slightly shortened compared to left leg  Skin:    General: Skin is warm and dry.     Capillary Refill: Capillary refill takes less than 2 seconds.  Neurological:     Mental Status: She is alert.     Comments: Patient has some mumbling discussing how long in the room. Is not clear she is not responding to me because she cannot hear me or does not understand She appears to have equal tone in her bilateral upper extremities and lower extremities.    ED Results / Procedures / Treatments   Labs (all labs ordered are listed, but only abnormal results are displayed) Labs Reviewed  URINALYSIS, ROUTINE W REFLEX MICROSCOPIC - Abnormal; Notable for the following components:      Result Value   Hgb urine dipstick MODERATE (*)    All other components within normal limits  COMPREHENSIVE METABOLIC PANEL - Abnormal; Notable for the following components:   Calcium 8.5 (*)    Total Protein 6.2 (*)    Albumin 2.8 (*)    All other components within normal limits  CBC - Abnormal; Notable for the following  components:   Hemoglobin 11.1 (*)    RDW 17.4 (*)    All other components within normal limits  URINE CULTURE    EKG None  Radiology CT Head Wo Contrast  Result Date: 01/10/2021 CLINICAL DATA:  Altered mental status EXAM: CT HEAD WITHOUT CONTRAST TECHNIQUE: Contiguous axial images were obtained from the base of the skull through the vertex without intravenous contrast. COMPARISON:  CT head 11/23/2020 FINDINGS: Brain: No acute intracranial hemorrhage, mass effect, or herniation. No extra-axial fluid collections. No evidence of acute territorial infarct. No hydrocephalus. Mild-to-moderate cortical volume loss. Patchy hypodensities throughout the periventricular and subcortical white matter, likely  secondary to chronic microvascular ischemic changes. Vascular: No hyperdense vessel or unexpected calcification. Skull: Normal. Negative for fracture or focal lesion. Sinuses/Orbits: No acute finding. Other: None. IMPRESSION: Chronic changes with no acute intracranial process identified. Electronically Signed   By: Ofilia Neas M.D.   On: 01/10/2021 11:22   DG Chest Port 1 View  Result Date: 01/10/2021 CLINICAL DATA:  Altered mental status. EXAM: PORTABLE CHEST 1 VIEW COMPARISON:  Chest x-Leniyah Martell dated November 23, 2020. FINDINGS: Stable cardiomediastinal silhouette with normal heart size. Chronic mildly coarsened interstitial markings. No focal consolidation, pleural effusion, or pneumothorax. Unchanged elevation of the right hemidiaphragm. No acute osseous abnormality. IMPRESSION: 1. No active disease. Electronically Signed   By: Titus Dubin M.D.   On: 01/10/2021 11:53   DG Hip Unilat W or Wo Pelvis 2-3 Views Right  Result Date: 01/10/2021 CLINICAL DATA:  Shortening right leg EXAM: DG HIP (WITH OR WITHOUT PELVIS) 2-3V RIGHT COMPARISON:  None. FINDINGS: Decreased osseous mineralization. Alignment is anatomic. No definite fracture. Degenerative changes are present. IMPRESSION: No definite acute fracture. However, if there is significant clinical suspicion for hip fracture, CT or MRI is recommended in this patient with decreased osseous mineralization. Electronically Signed   By: Macy Mis M.D.   On: 01/10/2021 11:03    Procedures Procedures   Medications Ordered in ED Medications  cefTRIAXone (ROCEPHIN) 1 g in sodium chloride 0.9 % 100 mL IVPB (1 g Intravenous New Bag/Given 01/10/21 1218)    ED Course  I have reviewed the triage vital signs and the nursing notes.  Pertinent labs & imaging results that were available during my care of the patient were reviewed by me and considered in my medical decision making (see chart for details). Medical Decision Making 87 year old female, DNR,  presents today with some reported shaking.  With her daughters arrival here.  She reports that the patient gets shaking of her extremities and tends to pick at things in her bed whenever she has a urinary tract infection.  Patient evaluated for acute metabolic etiologies including infection, CT head for acute bleeding or stroke, x-rays obtained of right hip, pelvis, and chest x-Aneyah Lortz.  X-rays of the right hip were obtained due to noted some shortening of the right lower extremity although no tenderness was noted.   Amount and/or Complexity of Data Reviewed Independent Historian:     Details: From EMS via nursing home and from daughter External Data Reviewed: labs, radiology, ECG and notes. Labs: ordered. Decision-making details documented in ED Course. Radiology: ordered and independent interpretation performed. Decision-making details documented in ED Course. ECG/medicine tests: ordered and independent interpretation performed. Decision-making details documented in ED Course. Discussion of management or test interpretation with external provider(s): Daughter states this is happened multiple times in the past and feels it is most consistent with urinary tract infection.  She does have a few  white blood cells in her urine but no bacteria noted.  After ample discussion with daughter, plan is to give her a dose of Rocephin and send her home on Keflex.  Urine will be cultured.  They will stop the antibiotics if culture is negative.     Clinical Course as of 01/10/21 1225  Fri Jan 10, 2021  1218 Reviewed and interpreted c-Met with mild hypocalcemia and low protein and albumin CBC reviewed with normal white blood cell count and mild anemia at 11.1 [DR]  1219 Urinalysis obtained hemoglobin dipstick is moderate, white blood cells 0-5, no bacteria seen, squamous epithelial cells verify this was a catheterized specimen.  [DR]  1219 CT brain reviewed and interpreted with no acute abnormalities Radiologist  interpretation chronic changes no acute intracranial process identified Chest x-Raetta Agostinelli personally reviewed and interpreted with no evidence of acute abnormality and radiologist interpretation reviewed with known severe abnormality Right hip and pelvis reviewed and personally interpreted with no evidence of acute fracture Reviewed radiologist interpretation is concurrent although advises further imaging of fracture suspected.  Discussed this with daughter and patient is nonambulatory will not pursue at this time [DR]    Clinical Course User Index [DR] Pattricia Boss, MD   MDM Rules/Calculators/A&P                            Final Clinical Impression(s) / ED Diagnoses Final diagnoses:  Dementia with agitation, unspecified dementia severity, unspecified dementia type  Coarse tremors    Rx / DC Orders ED Discharge Orders          Ordered    cephALEXin (KEFLEX) 500 MG capsule  4 times daily        01/10/21 1224             Pattricia Boss, MD 01/10/21 1225

## 2021-01-10 NOTE — ED Notes (Signed)
[facility] called to transport patient  

## 2021-01-10 NOTE — ED Triage Notes (Addendum)
Pt arrived via [facility] with c/c of shaking from [facility]. Per EMS pts facing has been twitch and wasn't speaking like normal. When EMS arrived pt was talking like normal. This has happen before and was diagnosed with UTI.  Addendum:  Pt arrived only saying "Stop burning me". Pt unable to verbalize what is burning or any other questions asked.   138/74, 64HR, 93-100% RA, 111 CBG

## 2021-01-11 LAB — URINE CULTURE

## 2021-01-17 ENCOUNTER — Telehealth (HOSPITAL_COMMUNITY): Payer: Self-pay

## 2021-01-17 NOTE — Telephone Encounter (Signed)
Received a call from the pt.s [facility], Toni Amend, wanted to find out pt.s urine culture results from her ED visit on 12/31/2020.   Reviewed the results and they recommend recollection due to many species noted.  She will notify the facility's MD.

## 2021-03-14 ENCOUNTER — Other Ambulatory Visit: Payer: Self-pay

## 2021-03-14 ENCOUNTER — Emergency Department (HOSPITAL_COMMUNITY): Payer: Medicare Other

## 2021-03-14 ENCOUNTER — Inpatient Hospital Stay (HOSPITAL_COMMUNITY)
Admission: EM | Admit: 2021-03-14 | Discharge: 2021-03-18 | DRG: 689 | Disposition: A | Discharge status: Skilled Nursing Facility | Payer: Medicare Other | Source: Skilled Nursing Facility | Attending: Internal Medicine | Admitting: Internal Medicine

## 2021-03-14 DIAGNOSIS — M1990 Unspecified osteoarthritis, unspecified site: Secondary | ICD-10-CM | POA: Diagnosis present

## 2021-03-14 DIAGNOSIS — Z9013 Acquired absence of bilateral breasts and nipples: Secondary | ICD-10-CM

## 2021-03-14 DIAGNOSIS — E8809 Other disorders of plasma-protein metabolism, not elsewhere classified: Secondary | ICD-10-CM | POA: Diagnosis present

## 2021-03-14 DIAGNOSIS — Z20822 Contact with and (suspected) exposure to covid-19: Secondary | ICD-10-CM | POA: Diagnosis present

## 2021-03-14 DIAGNOSIS — I2510 Atherosclerotic heart disease of native coronary artery without angina pectoris: Secondary | ICD-10-CM | POA: Diagnosis present

## 2021-03-14 DIAGNOSIS — Z8249 Family history of ischemic heart disease and other diseases of the circulatory system: Secondary | ICD-10-CM

## 2021-03-14 DIAGNOSIS — N390 Urinary tract infection, site not specified: Principal | ICD-10-CM | POA: Diagnosis present

## 2021-03-14 DIAGNOSIS — Z7401 Bed confinement status: Secondary | ICD-10-CM

## 2021-03-14 DIAGNOSIS — Z79899 Other long term (current) drug therapy: Secondary | ICD-10-CM

## 2021-03-14 DIAGNOSIS — R1310 Dysphagia, unspecified: Secondary | ICD-10-CM | POA: Diagnosis present

## 2021-03-14 DIAGNOSIS — Z91041 Radiographic dye allergy status: Secondary | ICD-10-CM

## 2021-03-14 DIAGNOSIS — F0390 Unspecified dementia without behavioral disturbance: Secondary | ICD-10-CM | POA: Diagnosis present

## 2021-03-14 DIAGNOSIS — J449 Chronic obstructive pulmonary disease, unspecified: Secondary | ICD-10-CM | POA: Diagnosis present

## 2021-03-14 DIAGNOSIS — Z87891 Personal history of nicotine dependence: Secondary | ICD-10-CM

## 2021-03-14 DIAGNOSIS — R627 Adult failure to thrive: Secondary | ICD-10-CM | POA: Diagnosis present

## 2021-03-14 DIAGNOSIS — E785 Hyperlipidemia, unspecified: Secondary | ICD-10-CM | POA: Diagnosis present

## 2021-03-14 DIAGNOSIS — I10 Essential (primary) hypertension: Secondary | ICD-10-CM | POA: Diagnosis present

## 2021-03-14 DIAGNOSIS — G8929 Other chronic pain: Secondary | ICD-10-CM | POA: Diagnosis present

## 2021-03-14 DIAGNOSIS — Z888 Allergy status to other drugs, medicaments and biological substances status: Secondary | ICD-10-CM

## 2021-03-14 DIAGNOSIS — Z66 Do not resuscitate: Secondary | ICD-10-CM | POA: Diagnosis present

## 2021-03-14 DIAGNOSIS — G9341 Metabolic encephalopathy: Secondary | ICD-10-CM | POA: Diagnosis present

## 2021-03-14 DIAGNOSIS — Z6828 Body mass index (BMI) 28.0-28.9, adult: Secondary | ICD-10-CM

## 2021-03-14 DIAGNOSIS — B952 Enterococcus as the cause of diseases classified elsewhere: Secondary | ICD-10-CM | POA: Diagnosis present

## 2021-03-14 DIAGNOSIS — R197 Diarrhea, unspecified: Secondary | ICD-10-CM | POA: Diagnosis not present

## 2021-03-14 DIAGNOSIS — R4182 Altered mental status, unspecified: Secondary | ICD-10-CM | POA: Diagnosis present

## 2021-03-14 DIAGNOSIS — Z809 Family history of malignant neoplasm, unspecified: Secondary | ICD-10-CM

## 2021-03-14 DIAGNOSIS — Z880 Allergy status to penicillin: Secondary | ICD-10-CM

## 2021-03-14 DIAGNOSIS — L8990 Pressure ulcer of unspecified site, unspecified stage: Secondary | ICD-10-CM | POA: Insufficient documentation

## 2021-03-14 DIAGNOSIS — J9611 Chronic respiratory failure with hypoxia: Secondary | ICD-10-CM | POA: Diagnosis present

## 2021-03-14 DIAGNOSIS — G9340 Encephalopathy, unspecified: Secondary | ICD-10-CM | POA: Diagnosis present

## 2021-03-14 DIAGNOSIS — Z993 Dependence on wheelchair: Secondary | ICD-10-CM

## 2021-03-14 DIAGNOSIS — I959 Hypotension, unspecified: Secondary | ICD-10-CM | POA: Diagnosis present

## 2021-03-14 DIAGNOSIS — I352 Nonrheumatic aortic (valve) stenosis with insufficiency: Secondary | ICD-10-CM | POA: Diagnosis present

## 2021-03-14 DIAGNOSIS — L89152 Pressure ulcer of sacral region, stage 2: Secondary | ICD-10-CM | POA: Diagnosis present

## 2021-03-14 DIAGNOSIS — E870 Hyperosmolality and hypernatremia: Secondary | ICD-10-CM | POA: Diagnosis present

## 2021-03-14 DIAGNOSIS — Z853 Personal history of malignant neoplasm of breast: Secondary | ICD-10-CM

## 2021-03-14 DIAGNOSIS — E860 Dehydration: Secondary | ICD-10-CM | POA: Diagnosis present

## 2021-03-14 LAB — COMPREHENSIVE METABOLIC PANEL
ALT: 12 U/L (ref 0–44)
AST: 16 U/L (ref 15–41)
Albumin: 2.3 g/dL — ABNORMAL LOW (ref 3.5–5.0)
Alkaline Phosphatase: 137 U/L — ABNORMAL HIGH (ref 38–126)
Anion gap: 6 (ref 5–15)
BUN: 26 mg/dL — ABNORMAL HIGH (ref 8–23)
CO2: 30 mmol/L (ref 22–32)
Calcium: 8.6 mg/dL — ABNORMAL LOW (ref 8.9–10.3)
Chloride: 117 mmol/L — ABNORMAL HIGH (ref 98–111)
Creatinine, Ser: 0.87 mg/dL (ref 0.44–1.00)
GFR, Estimated: >60 mL/min (ref >60)
Glucose, Bld: 85 mg/dL (ref 70–99)
Potassium: 4.3 mmol/L (ref 3.5–5.1)
Sodium: 153 mmol/L — ABNORMAL HIGH (ref 135–145)
Total Bilirubin: <0.1 mg/dL — ABNORMAL LOW (ref 0.3–1.2)
Total Protein: 6.3 g/dL — ABNORMAL LOW (ref 6.5–8.1)

## 2021-03-14 LAB — CBC WITH DIFFERENTIAL/PLATELET
Abs Immature Granulocytes: 0.02 10*3/uL (ref 0.00–0.07)
Basophils Absolute: 0 10*3/uL (ref 0.0–0.1)
Basophils Relative: 0 %
Eosinophils Absolute: 0.2 10*3/uL (ref 0.0–0.5)
Eosinophils Relative: 4 %
HCT: 41.1 % (ref 36.0–46.0)
Hemoglobin: 12 g/dL (ref 12.0–15.0)
Immature Granulocytes: 0 %
Lymphocytes Relative: 36 %
Lymphs Abs: 1.9 10*3/uL (ref 0.7–4.0)
MCH: 28.7 pg (ref 26.0–34.0)
MCHC: 29.2 g/dL — ABNORMAL LOW (ref 30.0–36.0)
MCV: 98.3 fL (ref 80.0–100.0)
Monocytes Absolute: 0.4 10*3/uL (ref 0.1–1.0)
Monocytes Relative: 8 %
Neutro Abs: 2.8 10*3/uL (ref 1.7–7.7)
Neutrophils Relative %: 52 %
Platelets: 167 10*3/uL (ref 150–400)
RBC: 4.18 MIL/uL (ref 3.87–5.11)
RDW: 17.8 % — ABNORMAL HIGH (ref 11.5–15.5)
WBC: 5.3 10*3/uL (ref 4.0–10.5)
nRBC: 0 % (ref 0.0–0.2)

## 2021-03-14 LAB — CBG MONITORING, ED: Glucose-Capillary: 100 mg/dL — ABNORMAL HIGH (ref 70–99)

## 2021-03-14 LAB — RESP PANEL BY RT-PCR (FLU A&B, COVID) ARPGX2
Influenza A by PCR: NEGATIVE
Influenza B by PCR: NEGATIVE
SARS Coronavirus 2 by RT PCR: NEGATIVE

## 2021-03-14 LAB — TROPONIN I (HIGH SENSITIVITY): Troponin I (High Sensitivity): 13 ng/L (ref <18)

## 2021-03-14 MED ORDER — POLYVINYL ALCOHOL 1.4 % OP SOLN
1.0000 [drp] | Freq: Two times a day (BID) | OPHTHALMIC | Status: DC
  Administered 2021-03-15 – 2021-03-18 (×6): 1 [drp] via OPHTHALMIC
  Filled 2021-03-14: qty 15

## 2021-03-14 MED ORDER — OLOPATADINE HCL 0.1 % OP SOLN
1.0000 [drp] | Freq: Two times a day (BID) | OPHTHALMIC | Status: DC
  Administered 2021-03-15 – 2021-03-18 (×6): 1 [drp] via OPHTHALMIC
  Filled 2021-03-14: qty 5

## 2021-03-14 MED ORDER — ZINC OXIDE 20 % EX OINT
1.0000 "application " | TOPICAL_OINTMENT | Freq: Three times a day (TID) | CUTANEOUS | Status: DC | PRN
  Filled 2021-03-14: qty 28.35

## 2021-03-14 MED ORDER — DEXTROSE 5 % IV SOLN: INTRAVENOUS | Status: AC

## 2021-03-14 MED ORDER — ENOXAPARIN SODIUM 40 MG/0.4ML IJ SOSY
40.0000 mg | PREFILLED_SYRINGE | INTRAMUSCULAR | Status: DC
  Administered 2021-03-15 – 2021-03-18 (×4): 40 mg via SUBCUTANEOUS
  Filled 2021-03-14 (×4): qty 0.4

## 2021-03-14 MED ORDER — SODIUM CHLORIDE 0.9 % IV SOLN
1.0000 g | Freq: Once | INTRAVENOUS | Status: AC
  Administered 2021-03-14: 1 g via INTRAVENOUS
  Filled 2021-03-14: qty 10

## 2021-03-14 MED ORDER — TRAMADOL HCL 50 MG PO TABS
50.0000 mg | ORAL_TABLET | Freq: Four times a day (QID) | ORAL | Status: DC | PRN
  Administered 2021-03-15: 50 mg via ORAL
  Filled 2021-03-14: qty 1

## 2021-03-14 MED ORDER — ACETAMINOPHEN 325 MG PO TABS
650.0000 mg | ORAL_TABLET | Freq: Three times a day (TID) | ORAL | Status: DC
  Administered 2021-03-14 – 2021-03-18 (×12): 650 mg via ORAL
  Filled 2021-03-14 (×11): qty 2

## 2021-03-14 MED ORDER — LIDOCAINE 5 % EX PTCH
1.0000 | MEDICATED_PATCH | CUTANEOUS | Status: DC
  Administered 2021-03-14 – 2021-03-18 (×4): 1 via TRANSDERMAL
  Filled 2021-03-14 (×4): qty 1

## 2021-03-14 MED ORDER — PRO-STAT SUGAR FREE PO LIQD
30.0000 mL | Freq: Two times a day (BID) | ORAL | Status: DC
  Filled 2021-03-14 (×3): qty 30

## 2021-03-14 MED ORDER — IBUPROFEN 400 MG PO TABS: 400.0000 mg | ORAL_TABLET | Freq: Four times a day (QID) | ORAL | Status: DC | PRN

## 2021-03-14 MED ORDER — LACTATED RINGERS IV BOLUS
500.0000 mL | Freq: Once | INTRAVENOUS | Status: AC
  Administered 2021-03-14: 500 mL via INTRAVENOUS

## 2021-03-14 MED ORDER — DEXTROSE 5 % IV SOLN: INTRAVENOUS | Status: DC

## 2021-03-14 MED ORDER — DICLOFENAC SODIUM 1 % TD GEL: 4.0000 g | Freq: Four times a day (QID) | TRANSDERMAL | Status: DC

## 2021-03-14 NOTE — ED Provider Notes (Signed)
[facility] EMERGENCY DEPARTMENT Provider Note   CSN: [id number] Arrival date & time: 03/14/21  1749     History  Chief Complaint  Patient presents with   Pneumonia    Jennifer [name] is a 88 y.o. female presenting from her nursing facility [facility] with concern for lab abnormalities, hyponatremia, possible pneumonia and UTI.  Patient was diagnosed with pneumonia and started on Bactrim 2 days ago.  There is report that she has had a cough and congestion, and may be having "symptoms" of UTI.  She has a history of multidrug-resistant UTIs per my review of the medical records.  She wears 3 L nasal cannula baseline is on her home oxygen.  She does have a dementia, is minimally verbal at baseline per my review of the medical records and or report from EMS.  She arrives with a DNR form at bedside  Her daughter who presents at bedside provided supplemental history, reports the patient had abrupt change in her mental status approximately 4 days ago, says this is an identical presentation every time the patient has a UTI.  The daughter reports "we will bring her into the hospital and give her some IV antibiotics and she gets better almost immediately overnight".  At baseline the patient does have dementia, but when she is mentating well she is able to hold a simple conversation, she knows where she is can talk about the weather.  Currently the patient is extremely confused according to her daughter.  Her daughter reports that the patient does not eat or drink much at all, barely drinks any water.  And she is concerned about dehydration  HPI     Home Medications Prior to Admission medications   Medication Sig Start Date End Date Taking? Authorizing Provider  acetaminophen (TYLENOL) 325 MG tablet Take 650 mg by mouth 3 (three) times daily.   Yes [provider]  Amino Acids-Protein Hydrolys (FEEDING SUPPLEMENT, PRO-STAT SUGAR FREE 64,) LIQD Take 30 mLs by mouth in  the morning and at bedtime.   Yes [provider]  carbamide peroxide (DEBROX) 6.5 % OTIC solution Place 10 drops into both ears See admin instructions. Twice a day on the 4th, 5th, 6th,7th of every 3rd month   Yes [provider]  Cholecalciferol (VITAMIN D3) 50 MCG (2000 UT) TABS Take 2,000 Units by mouth daily.   Yes [provider]  Cranberry 500 MG TABS Take 500 mg by mouth daily.   Yes [provider]  Dextrose-Sodium Chloride (DEXTROSE 5 % AND 0.9% NACL) 5-0.9 % infusion Inject 50 mL/hr into the vein continuous. 50 ml/hr x 1 L every shift   Yes [provider]  diclofenac sodium (VOLTAREN) 1 % GEL Apply to both knees QID PRN Patient taking differently: Apply 4 g topically 4 (four) times daily. Apply to both knees 04/29/18  Yes Vassie Loll, MD  Estradiol [facility] MAINTENANCE PACK) 4 MCG INST Place 4 mcg vaginally 2 (two) times a week. Monday's and Thursday's   Yes [provider]  geriatric multivitamins-minerals (ELDERTONIC/GEVRABON) LIQD Take 15 mLs by mouth daily.   Yes [provider]  lidocaine (LIDODERM) 5 % Place 1 patch onto the skin daily. Remove & Discard patch within 12 hours or as directed by MD Patient taking differently: Place 1 patch onto the skin daily. Apply to lower back. Remove & Discard patch within 12 hours or as directed by MD 03/02/18  Yes [name], MD  Olopatadine HCl 0.2 %  SOLN Place 1 drop into both eyes daily.   Yes [provider]  oxybutynin (DITROPAN-XL) 5 MG 24 hr tablet Take 5 mg by mouth at bedtime.   Yes [provider]  polyvinyl alcohol (LIQUIFILM TEARS) 1.4 % ophthalmic solution Place 1 drop into both eyes 2 (two) times daily.   Yes [provider]  Probiotic Product (FORTIFY PROBIOTIC WOMENS PO) Take 1 capsule by mouth daily.   Yes [provider]  sulfamethoxazole-trimethoprim (BACTRIM DS) 800-160 MG tablet Take 1 tablet by mouth 2 (two) times  daily. 03/12/21 03/20/21 Yes [provider]  traMADol (ULTRAM) 50 MG tablet Take 1 tablet (50 mg total) by mouth See admin instructions. 50mg  twice daily scheduled, as well as 50mg  every 6 hours as needed for moderate to severe pain Patient taking differently: Take 50 mg by mouth 2 (two) times daily. Up to 4 times as needed. 11/26/20  Yes Ghimire, , MD  zinc oxide 20 % ointment Apply 1 application topically in the morning, at noon, and at bedtime. To sacrum and buttocks   Yes [provider]  OVER THE COUNTER MEDICATION Take 1 Container by mouth every evening. 11/28/20, Historical, MD      Allergies    Aricept [donepezil hcl], Iohexol, Iron, and Penicillins    Review of Systems   Review of Systems  Physical Exam Updated Vital Signs BP 128/67    Pulse 74    Temp 97.7 F (36.5 C) (Oral)    Resp 16    Ht 5\' 6"  (1.676 m)    Wt 81 kg    SpO2 90%    BMI 28.82 kg/m  Physical Exam Constitutional:      General: She is not in acute distress. HENT:     Head: Normocephalic and atraumatic.  Eyes:     Conjunctiva/sclera: Conjunctivae normal.     Pupils: Pupils are equal, round, and reactive to light.  Cardiovascular:     Rate and Rhythm: Normal rate and regular rhythm.  Pulmonary:     Effort: Pulmonary effort is normal.     Comments: 3 L nasal cannula, no hypoxia, rhonchi bilaterally Abdominal:     General: There is no distension.     Tenderness: There is no abdominal tenderness.  Skin:    General: Skin is warm and dry.  Neurological:     General: No focal deficit present.     Mental Status: She is alert. Mental status is at baseline.    ED Results / Procedures / Treatments   Labs (all labs ordered are listed, but only abnormal results are displayed) Labs Reviewed  COMPREHENSIVE METABOLIC PANEL - Abnormal; Notable for the following components:      Result Value   Sodium 153 (*)    Chloride 117 (*)    BUN 26 (*)    Calcium 8.6 (*)    Total  Protein 6.3 (*)    Albumin 2.3 (*)    Alkaline Phosphatase 137 (*)    Total Bilirubin <0.1 (*)    All other components within normal limits  CBC WITH DIFFERENTIAL/PLATELET - Abnormal; Notable for the following components:   MCHC 29.2 (*)    RDW 17.8 (*)    All other components within normal limits  CBG MONITORING, ED - Abnormal; Notable for the following components:   Glucose-Capillary 100 (*)    All other components within normal limits  RESP PANEL BY RT-PCR (FLU A&B, COVID) ARPGX2  URINALYSIS,  ROUTINE W REFLEX MICROSCOPIC  TSH  BASIC METABOLIC PANEL  CBC  BASIC METABOLIC PANEL  BASIC METABOLIC PANEL  BASIC METABOLIC PANEL  TROPONIN I (HIGH SENSITIVITY)    EKG EKG Interpretation  Date/Time:  Friday March 14 2021 17:55:22 EST Ventricular Rate:  62 PR Interval:  162 QRS Duration: 90 QT Interval:  380 QTC Calculation: 386 R Axis:   61 Text Interpretation: Sinus rhythm Probable anterior infarct, age indeterminate Confirmed by Alvester Chou [phone]) on 03/14/2021 7:03:50 PM  Radiology DG Chest 2 View  Result Date: 03/14/2021 CLINICAL DATA:  Evaluate for pneumonia EXAM: CHEST - 2 VIEW COMPARISON:  01/10/2021 FINDINGS: Cardiac size is within normal limits. Thoracic aorta is tortuous and ectatic. There is hiatal hernia in the retrocardiac region. Right hemidiaphragm is elevated with possible interposition of colon between diaphragm and liver. Surgical clips are seen in both axillary regions, more so on the right side. There are no signs of pulmonary edema or new focal infiltrates. IMPRESSION: There are no new infiltrates or signs of pulmonary edema. Electronically Signed   By: Ernie Avena M.D.   On: 03/14/2021 19:35    Procedures Procedures    Medications Ordered in ED Medications  enoxaparin (LOVENOX) injection 40 mg (has no administration in time range)  ibuprofen (ADVIL) tablet 400 mg (has no administration in time range)  dextrose 5 % solution (has no administration  in time range)  acetaminophen (TYLENOL) tablet 650 mg (has no administration in time range)  feeding supplement (PRO-STAT SUGAR FREE 64) liquid 30 mL (has no administration in time range)  diclofenac sodium (VOLTAREN) 1 % transdermal gel 4 g (has no administration in time range)  lidocaine (LIDODERM) 5 % 1 patch (has no administration in time range)  olopatadine (PATANOL) 0.1 % ophthalmic solution 1 drop (has no administration in time range)  polyvinyl alcohol (LIQUIFILM TEARS) 1.4 % ophthalmic solution 1 drop (has no administration in time range)  traMADol (ULTRAM) tablet 50 mg (has no administration in time range)  zinc oxide 20 % ointment 1 application (has no administration in time range)  lactated ringers bolus 500 mL (500 mLs Intravenous New Bag/Given 03/14/21 2225)  cefTRIAXone (ROCEPHIN) 1 g in sodium chloride 0.9 % 100 mL IVPB (0 g Intravenous Stopped 03/14/21 2200)    ED Course/ Medical Decision Making/ A&P Clinical Course as of 03/14/21 2325  Fri Mar 14, 2021  2010 Nursing reports 2 unsuccessful attempts to catheterize the patient.  Bladder scan showed approximately 40 to 50 cc of urine, which is a trace amount.  I have ordered a small fluid bolus 500 cc of LR, will discuss with the hospitalist further fluid management given her hyponatremia. [MT]    Clinical Course User Index [MT] Aniylah Avans, Kermit Balo, MD                           Medical Decision Making Amount and/or Complexity of Data Reviewed Labs: ordered. Radiology: ordered.  Risk Decision regarding hospitalization.   This patient presents to the ED with concern for lab abnormalities, possible pneumonia, possible UTI. This involves an extensive number of treatment options, and is a complaint that carries with it a high risk of complications and morbidity.    Patient is nonverbal and has dementia cannot provide any further history  Additional history obtained from paramedics and patient's  External records from outside  source obtained and reviewed including previous hospitalization and November 2022, for acute metabolic encephalopathy felt to  be secondary to COVID-19 infection and UTI; noted to have failure to thrive at that time and functional quadriplegL  Last positive urine culture from September 2020 which was multidrug-resistant E. coli, susceptible to Macrobid, imipenem, gentamicin only.  I ordered and personally interpreted labs.  The pertinent results include: Hypernatremia and hyperchloremia likely related to poor water intake.  Cr 0.87, Alb 2.3 consistent with malnourishment, white blood cell count within normal limits, no acute anemia.  COVID and flu are negative.  Troponin is 13.  I ordered imaging studies including x-ray of the chest I independently visualized and interpreted imaging which showed no focal infiltrate or evident pneumonia per my interpretation I agree with the radiologist interpretation  The patient was maintained on a cardiac monitor.  I personally viewed and interpreted the cardiac monitored which showed an underlying rhythm of: Sinus  Per my interpretation the patient's ECG shows sinus rhythm without acute ischemic findings.  I have a low suspicion at this time for ACS or PE  I ordered medication including IV fluid bolus with LR 500 cc for suspected dehydration.    After 2 failed attempts to obtain catheterized urine, we will hydrate the patient and try again.  Unfortunately her daughter reports this is a recurring issue, that they were not able to get a clean urine sample at her nursing facility this week either.  Based on her daughter's report of patient's clinical hx of UTI's and confusion, I would treat her empirically for suspected UTI as a cause of her altered mental status at this point.  She was treated likewise in December with Rocephin and had rapid improvement of her mental status.  May be reasonable to reinitiate treatment with IV Rocephin at this point; if no  improvement overnight, inpatient team could consider broadening the patient to an alternative medication such as imipenem based on Sept 2020 E Coli culture sensitivities.  After the interventions noted above, I reevaluated the patient and found that they have: stayed the same  Social Determinants of Health:dementia, bed-bound  Dispostion:  After consideration of the diagnostic results and the patients response to treatment, I feel that the patent would benefit from hospitalization.         Final Clinical Impression(s) / ED Diagnoses Final diagnoses:  Hypernatremia  Altered mental status, unspecified altered mental status type    Rx / DC Orders ED Discharge Orders     None         Christan Defranco, Kermit BaloMatthew J, MD 03/14/21 2325

## 2021-03-14 NOTE — H&P (Addendum)
Date: 03/14/2021               Patient Name:  Jennifer [name] MRN: [id number]  DOB: 09/26/1922 Age / Sex: 88 y.o., female   PCP: Pcp, No         Medical Service: Internal Medicine Teaching Service         Attending Physician: Dr. Earl Lagos, MD    First Contact: Dr. Ned Card Pager: [phone]  Second Contact: Dr. Kirke Corin Pager: [phone]       After Hours (After 5p/  First Contact Pager: [phone]  weekends / holidays): Second Contact Pager: [phone]   Chief Complaint: AMS  History of Present Illness:  DULCY SIDA is a 88 year old female with past medical history of unspecified dementia starting in 2016, HTN, hx breast cancer s/p bilateral mastectomy, recurrent UTIs with multidrug resistant ESBL E. coli. 2020,  COPD, who resides at [facility] facility where she is bedbound and is fully dependent for her care, who presents to [facility] ED after lab work obtained at [facility] showed hypernatremia.  Patient's daughter is at bedside and provides most of the history.  Patient's daughter reports earlier this week patient became more confused, lethargic, communicating less.  The symptoms usually occur when patient has UTI.  SNF noted blue fingertips and patient started on oxygen, daughter also noted new cough over the last week.  Patient was initially diagnosed with pneumonia and started on Bactrim 2 days ago.  Daughter also noted patient has been eating and drinking less in the last week.  Is only had about 2 ensures in the last few days.  Daughter suspects dehydration is why ED providers have had a difficult time obtaining a urine sample.  Daughter denies recent fevers.  She reports patient has never been on oxygen at SNF prior to this week.  She reports patient is on pured diet at SNF and will pocket food in her cheek when she forgets to chew and swallow.  She has not seen patient aspirate food or liquid in the last week.  Patient's daughter notes that usually when her mother  has an acute change in mental status like this she is brought to the hospital for IV antibiotics for a UTI and improves overnight after initiating antibiotics.  At baseline patient is oriented to self only.  She will have good days where she is communicative, can hold a simple conversation, and other days where she communicates less.  ED course: On arrival patient afebrile and hemodynamically stable satting well on 3L [place].  Started on ceftriaxone and small fluid bolus.  RN staff attempted to wean oxygen though patient desatted and oxygen was placed back to 3L [place].  CBC without leukocytosis, otherwise fairly unremarkable.  CMP with moderate hypernatremia with sodium of 153.  Fairly normal kidney and liver function, albumin low at 2.3.  Medicine called for admission.  Meds:  Current Meds  Medication Sig   acetaminophen (TYLENOL) 325 MG tablet Take 650 mg by mouth 3 (three) times daily.   Amino Acids-Protein Hydrolys (FEEDING SUPPLEMENT, PRO-STAT SUGAR FREE 64,) LIQD Take 30 mLs by mouth in the morning and at bedtime.   carbamide peroxide (DEBROX) 6.5 % OTIC solution Place 10 drops into both ears See admin instructions. Twice a day on the 4th, 5th, 6th,7th of every 3rd month   Cholecalciferol (VITAMIN D3) 50 MCG (2000 UT) TABS Take 2,000 Units by mouth daily.   Cranberry 500 MG TABS Take 500 mg by  mouth daily.   Dextrose-Sodium Chloride (DEXTROSE 5 % AND 0.9% NACL) 5-0.9 % infusion Inject 50 mL/hr into the vein continuous. 50 ml/hr x 1 L every shift   diclofenac sodium (VOLTAREN) 1 % GEL Apply to both knees QID PRN (Patient taking differently: Apply 4 g topically 4 (four) times daily. Apply to both knees)   Estradiol (IMVEXXY MAINTENANCE PACK) 4 MCG INST Place 4 mcg vaginally 2 (two) times a week. Monday's and Thursday's   geriatric multivitamins-minerals (ELDERTONIC/GEVRABON) LIQD Take 15 mLs by mouth daily.   lidocaine (LIDODERM) 5 % Place 1 patch onto the skin daily. Remove & Discard patch within 12  hours or as directed by MD (Patient taking differently: Place 1 patch onto the skin daily. Apply to lower back. Remove & Discard patch within 12 hours or as directed by MD)   Olopatadine HCl 0.2 % SOLN Place 1 drop into both eyes daily.   oxybutynin (DITROPAN-XL) 5 MG 24 hr tablet Take 5 mg by mouth at bedtime.   polyvinyl alcohol (LIQUIFILM TEARS) 1.4 % ophthalmic solution Place 1 drop into both eyes 2 (two) times daily.   Probiotic Product (FORTIFY PROBIOTIC WOMENS PO) Take 1 capsule by mouth daily.   sulfamethoxazole-trimethoprim (BACTRIM DS) 800-160 MG tablet Take 1 tablet by mouth 2 (two) times daily.   traMADol (ULTRAM) 50 MG tablet Take 1 tablet (50 mg total) by mouth See admin instructions. 50mg  twice daily scheduled, as well as 50mg  every 6 hours as needed for moderate to severe pain (Patient taking differently: Take 50 mg by mouth 2 (two) times daily. Up to 4 times as needed.)   zinc oxide 20 % ointment Apply 1 application topically in the morning, at noon, and at bedtime. To sacrum and buttocks    Allergies: Allergies as of 03/14/2021 - Review Complete 03/14/2021  Allergen Reaction Noted   Aricept [donepezil hcl] Other (See Comments) 11/14/2017   Iohexol  09/24/2003   Iron Other (See Comments)    Penicillins     Past Medical History:  Diagnosis Date   Anemia    iron deficiency post op.   CAD (coronary artery disease) 2008   Cath 40% mid LAD, 35% RCA   Cellulitis of finger    right    Chest pain 2006   Chronic back pain    COPD (chronic obstructive pulmonary disease) (HCC)    Gastroesophageal reflux disease    Hiatal hernia    Hyperlipidemia    Memory deficit    Nicotine addiction    Osteoarthritis    status post left TKA; surgery on the right is anticipated in the near future    Pulmonary nodules    stable since 2006   Shingles    right breast    Skin infection    Ulcer    gential   Use of cane as ambulatory aid     Family History:  Family History  Problem  Relation Age of Onset   Cancer Mother 27       breast    Cancer Father 68       prostate   Hypertension Father    Hypertension Sister    Heart disease Sister    Heart disease Sister    Heart disease Sister    Social History: Patient resides at [facility] skilled nursing facility where she is fully dependent for care.  She is wheelchair-bound.  She is a former cigarette smoker who smoked for 70 years and quit in 2017.  Per chart review pack years 17.5.  Patient does not drink alcohol.  No other substance use.  Previously patient used to work for [employer].  Review of Systems: A complete ROS was negative except as per HPI.   Physical Exam: Blood pressure 128/67, pulse 74, temperature 97.7 F (36.5 C), temperature source Oral, resp. rate 16, height 5\' 6"  (1.676 m), weight 81 kg, SpO2 90 %. Physical Exam: General: Chronically ill-appearing, thin, NAD HENT: Sunken cheeks, external nares and ears appear normal EYES: Patient keeps her eyes closed during entirety of the examination CV: regular rate, normal rhythm, holosystolic murmur appreciated best at apex, pedal pulses present bilaterally, cool distal extremities, peripheral cyanosis noted at fingertips bilaterally Pulmonary: normal work of breathing on 3L [place], rales appreciated anteriorly, no wheezes or rhonchi Abdominal: non-distended, soft, non-tender to palpation Skin: Warm and dry, no rashes or lesions Neurological: MS: Lethargic, will wake to mechanical stim, though prefers not to open eyes, nonverbal this evening, moans intermittently and daughter reports this is baseline Psych: normal affect  EKG: personally reviewed my interpretation is NSR, normal intervals, no ischemic changes, PVCs on tele  CXR: personally reviewed my interpretation is no new focal opacities compared to prior CXR, redemonstrated elevated right hemidiaphragm, no pleural effusions, redemonstrated tortuous thoracic aorta, surgical clips beneath  bilateral axilla likely from bilateral mastectomy.  Assessment & Plan by Problem: Principal Problem:   AMS (altered mental status)  Marea Myra GianottiM Mattioli is a 88 year old female with past medical history of unspecified dementia starting in 2016, HTN, hx breast cancer s/p bilateral mastectomy, recurrent UTIs with multidrug resistant ESBL E. coli. 2020,  COPD, who resides at [facility] where she is bedbound and is fully dependent for her care, who [facility] and [facility], with clinical presentation concerning for respiratory infection versus UTI.  #Multifocal encephalopathy #Suspect underlying infection: PNA vs pneumonitis vs UTI Patient's daughter reports patient has had AMS in the last 4 days, [facility].  Suspect AMS is secondary to multiple factors including hypernatremia to 153, and underlying infectious process.  Has history of recurrent UTIs with history of multidrug-resistant ESBL E. coli infection in 2020.  Patient afebrile without leukocytosis and CXR without focal opacities, though patient does have new oxygen requirement to 3 LNC as well as peripheral cyanosis and rales anteriorly, therefore pneumonia or pneumonitis remain on the differential.  Patient also has a history of dysphagia and is currently on a dysphagia 1 with thin liquid diet at SNF, therefore aspiration events leading up to presentation is possible.  We have not been able to obtain a urine sample for UA and culture despite multiple attempts at SNF and in the ED with catheterization.  Suspect oliguria is due to dehydration given patient has had poor p.o. intake in the last few days. Plan: -S/p ceftriaxone and 500 cc LR bolus in the ED -Continue ceftriaxone for treatment of PNA vs UTI -If no improvement overnight on ceftriaxone, will need to consider broadening ABX based on September 2020 E. coli culture  sensitivities -D5 100 cc/hour maintenance fluids -Holding centrally acting medications: Oxybutynin -Dysphagia 1 thin liquid diet if mental status allows -Consider repeat CXR in the a.m. if no improvement in respiratory status or if worsening respiratory status overnight  #Moderate hypernatremia Sodium on arrival 153.  Suspect hypernatremia is secondary to 1 week history of poor p.o. intake.  Per daughter patient has only  consumed 2 ensures in the last few days.  Low urine output with dry absorptive undergarments reported. Unable to obtain urine sample despite multiple catheterization attempts.  Suspect patient has been hypernatremic for several days.  Calculated free water deficit 2.5 L. Plan: -D5 100cc/hr for 16hrs -BMP q4hr -Encourage p.o. intake (dysphagia 1, thin liquids) if altered mental status improves  #Holosystolic murmur appreciated best at apex Appreciated murmur on exam, may be secondary to hypovolemia in the setting of poor p.o. intake which is further supported by cool extremities and peripheral cyanosis.  Also considered valvular dysfunction though no prior echo to review. Plan: -Echo routine  #History of dementia unspecified #Hypoalbuminemia #Failure to thrive Noted to have a MMSE score of 13/30 2019.  Patient started having memory decline in 2016.  Currently resident of [facility] and wheelchair-bound.  She is fully dependent for her care.  Has urinary incontinence at baseline.  Has also had dysphagia since 2018 and now on dysphagia 1 thin liquid diet.  Low albumin likely secondary to poor p.o. intake. Plan: -Delirium precautions -Follow-up TSH -Feeding supplement twice daily if patient's mental status improves -Zinc oxide ointment applied to sacrum 3 times daily  #Chronic pain secondary to osteoarthritis Plan: -Tylenol 650 mg 3 times daily scheduled -Voltaren gel 4 times daily scheduled, apply to knees -Lidocaine patch daily, apply to lower back -Tramadol 50  mg every 6 hours as needed -ibuprofen 400 mg every 6 hours as needed  Diet: Dysphagia 1, thin liquids VTE: Lovenox IVF: D5 100 cc/hour for 16 hours Code: DNR  Dispo: Admit patient to Observation with expected length of stay less than 2 midnights.  Portions of this report may have been transcribed using voice recognition software. Every effort was made to ensure accuracy; however, inadvertent computerized transcription errors may be present.   Signed: Ellison Carwin, MD 03/14/2021, 10:29 PM  Pager: [phone] After 5pm on weekdays and 1pm on weekends: On Call pager: [phone]

## 2021-03-14 NOTE — ED Triage Notes (Signed)
Pt bib gcems from [facility] for multiple complaints. Pt was dx with pneumonia and started on bactrim 3/1 with no improvement. Ems reports [facility] is concerned for a uti also. Ems reports pt had lab work done today and facility said pt was hypernatremic. Mental status at baseline. Pt arrives on 3L home o2.  ? ?BP 112/84, HR 60, Spo2: 100% 3L ?

## 2021-03-14 NOTE — ED Notes (Signed)
In and out attempted x2 unsucessful - 55 ml on bladder scan. MD notified.  ?

## 2021-03-15 ENCOUNTER — Other Ambulatory Visit (HOSPITAL_COMMUNITY): Payer: Medicare Other

## 2021-03-15 DIAGNOSIS — L8990 Pressure ulcer of unspecified site, unspecified stage: Secondary | ICD-10-CM | POA: Insufficient documentation

## 2021-03-15 DIAGNOSIS — R4182 Altered mental status, unspecified: Secondary | ICD-10-CM | POA: Diagnosis not present

## 2021-03-15 LAB — BASIC METABOLIC PANEL
Anion gap: 10 (ref 5–15)
Anion gap: 12 (ref 5–15)
Anion gap: 7 (ref 5–15)
Anion gap: 9 (ref 5–15)
BUN: 19 mg/dL (ref 8–23)
BUN: 22 mg/dL (ref 8–23)
BUN: 22 mg/dL (ref 8–23)
BUN: 24 mg/dL — ABNORMAL HIGH (ref 8–23)
CO2: 20 mmol/L — ABNORMAL LOW (ref 22–32)
CO2: 25 mmol/L (ref 22–32)
CO2: 27 mmol/L (ref 22–32)
CO2: 30 mmol/L (ref 22–32)
Calcium: 6.9 mg/dL — ABNORMAL LOW (ref 8.9–10.3)
Calcium: 8 mg/dL — ABNORMAL LOW (ref 8.9–10.3)
Calcium: 8.4 mg/dL — ABNORMAL LOW (ref 8.9–10.3)
Calcium: 8.5 mg/dL — ABNORMAL LOW (ref 8.9–10.3)
Chloride: 109 mmol/L (ref 98–111)
Chloride: 111 mmol/L (ref 98–111)
Chloride: 113 mmol/L — ABNORMAL HIGH (ref 98–111)
Chloride: 116 mmol/L — ABNORMAL HIGH (ref 98–111)
Creatinine, Ser: 0.66 mg/dL (ref 0.44–1.00)
Creatinine, Ser: 0.82 mg/dL (ref 0.44–1.00)
Creatinine, Ser: 0.86 mg/dL (ref 0.44–1.00)
Creatinine, Ser: 0.87 mg/dL (ref 0.44–1.00)
GFR, Estimated: >60 mL/min (ref >60)
GFR, Estimated: >60 mL/min (ref >60)
GFR, Estimated: >60 mL/min (ref >60)
GFR, Estimated: >60 mL/min (ref >60)
Glucose, Bld: 126 mg/dL — ABNORMAL HIGH (ref 70–99)
Glucose, Bld: 259 mg/dL — ABNORMAL HIGH (ref 70–99)
Glucose, Bld: 343 mg/dL — ABNORMAL HIGH (ref 70–99)
Glucose, Bld: 86 mg/dL (ref 70–99)
Potassium: 3.5 mmol/L (ref 3.5–5.1)
Potassium: 3.8 mmol/L (ref 3.5–5.1)
Potassium: 3.9 mmol/L (ref 3.5–5.1)
Potassium: 5.8 mmol/L — ABNORMAL HIGH (ref 3.5–5.1)
Sodium: 138 mmol/L (ref 135–145)
Sodium: 146 mmol/L — ABNORMAL HIGH (ref 135–145)
Sodium: 152 mmol/L — ABNORMAL HIGH (ref 135–145)
Sodium: 153 mmol/L — ABNORMAL HIGH (ref 135–145)

## 2021-03-15 LAB — URINALYSIS, ROUTINE W REFLEX MICROSCOPIC
Bilirubin Urine: NEGATIVE
Glucose, UA: NEGATIVE mg/dL
Ketones, ur: NEGATIVE mg/dL
Nitrite: NEGATIVE
Protein, ur: NEGATIVE mg/dL
Specific Gravity, Urine: 1.016 (ref 1.005–1.030)
pH: 5 (ref 5.0–8.0)

## 2021-03-15 LAB — CBC
HCT: 43.5 % (ref 36.0–46.0)
Hemoglobin: 13 g/dL (ref 12.0–15.0)
MCH: 28.9 pg (ref 26.0–34.0)
MCHC: 29.9 g/dL — ABNORMAL LOW (ref 30.0–36.0)
MCV: 96.7 fL (ref 80.0–100.0)
Platelets: 161 10*3/uL (ref 150–400)
RBC: 4.5 MIL/uL (ref 3.87–5.11)
RDW: 17.8 % — ABNORMAL HIGH (ref 11.5–15.5)
WBC: 9.5 10*3/uL (ref 4.0–10.5)
nRBC: 0 % (ref 0.0–0.2)

## 2021-03-15 LAB — TSH: TSH: 0.606 u[IU]/mL (ref 0.350–4.500)

## 2021-03-15 LAB — CBG MONITORING, ED
Glucose-Capillary: 40 mg/dL — CL (ref 70–99)
Glucose-Capillary: 118 mg/dL — ABNORMAL HIGH (ref 70–99)
Glucose-Capillary: 72 mg/dL (ref 70–99)

## 2021-03-15 MED ORDER — LACTATED RINGERS IV BOLUS
1000.0000 mL | Freq: Once | INTRAVENOUS | Status: AC
  Administered 2021-03-15: 1000 mL via INTRAVENOUS

## 2021-03-15 MED ORDER — SODIUM CHLORIDE 0.9 % IV SOLN
1.0000 g | INTRAVENOUS | Status: DC
  Administered 2021-03-15 – 2021-03-16 (×2): 1 g via INTRAVENOUS
  Filled 2021-03-15 (×2): qty 10

## 2021-03-15 MED ORDER — DEXTROSE 50 % IV SOLN
INTRAVENOUS | Status: AC
  Filled 2021-03-15: qty 50

## 2021-03-15 MED ORDER — PROSOURCE PLUS PO LIQD
30.0000 mL | Freq: Two times a day (BID) | ORAL | Status: DC
  Administered 2021-03-15 – 2021-03-17 (×5): 30 mL via ORAL
  Filled 2021-03-15 (×6): qty 30

## 2021-03-15 MED ORDER — DICLOFENAC SODIUM 1 % EX GEL
4.0000 g | Freq: Four times a day (QID) | CUTANEOUS | Status: DC
  Administered 2021-03-15 – 2021-03-18 (×14): 4 g via TOPICAL
  Filled 2021-03-15: qty 100

## 2021-03-15 MED ORDER — ORAL CARE MOUTH RINSE
15.0000 mL | Freq: Two times a day (BID) | OROMUCOSAL | Status: DC
  Administered 2021-03-16 – 2021-03-18 (×4): 15 mL via OROMUCOSAL

## 2021-03-15 NOTE — ED Notes (Signed)
RN aware of BP 

## 2021-03-15 NOTE — ED Notes (Signed)
Pt's BP dropped down to 80s, map 61. Admitting MD made aware. 1 bolus LR ordered.  ?

## 2021-03-15 NOTE — ED Notes (Addendum)
MD notified about pt's agitation and elevated HR. MD at bedside. CBG 40 - amp of dextrose given per verbal order.  ?

## 2021-03-15 NOTE — Progress Notes (Signed)
? ?HD#0 ?SUBJECTIVE:  ?Patient Summary: Jennifer [name] is a 88 year old female with past medical history of unspecified dementia starting in 2016, HTN, hx breast cancer s/p bilateral mastectomy, recurrent UTIs with multidrug resistant ESBL E. coli. 2020,  COPD, who presented with AMS and hypernatremia admitted for concern for infection of unknown source. ? ?Overnight Events:  None ? ?Interim History: Patient moves spontaneously but does not reply to questions or acknowledge the team. She makes noise with breathing and plays with her lip at baseline.  ? ?OBJECTIVE:  ?Vital Signs: ?Vitals:  ? 03/15/21 0241 03/15/21 0400 03/15/21 0530 03/15/21 0700  ?BP: (!) 96/56 127/76 97/74 (!) 103/55  ?Pulse:  86 (!) 34 99  ?Resp:  (!) 24 (!) 24 20  ?Temp:      ?TempSrc:      ?SpO2:  100%  98%  ?Weight:      ?Height:      ? ?Supplemental O2: Room Air ?SpO2: 98 % ?O2 Flow Rate (L/min): 3 L/min ? ?Filed Weights  ? 03/14/21 1755  ?Weight: 81 kg  ? ? ? ?Intake/Output Summary (Last 24 hours) at 03/15/2021 0826 ?Last data filed at 03/14/2021 2200 ?Gross per 24 hour  ?Intake 100 ml  ?Output --  ?Net 100 ml  ? ?Net IO Since Admission: 100 mL [03/15/21 0826] ? ?Physical Exam: ?Constitutional:Chronically ill appearing female. No acute distress. ?Cardio:Regular rate and rhythm.  ?Pulm:Mild expiratory wheezing noted on anterior auscultation. Appropriate O2 saturations on room air. ?Abdomen:Soft, non-tender, non-distended. ?FOY:DXAJOINO for extremity edema. ?Skin:Warm and dry.  ?Neuro:Lethargic without response to questions.  ? ?Patient Lines/Drains/Airways Status   ? ? Active Line/Drains/Airways   ? ? Name Placement date Placement time Site Days  ? Peripheral IV 01/10/21 20 G 1" Left Antecubital 01/10/21  0948  Antecubital  64  ? Peripheral IV 03/14/21 20 G Right Antecubital 03/14/21  1813  Antecubital  1  ? External Urinary Catheter 01/10/21  0926  --  64  ? ?  ?  ? ?  ? ? ? ?ASSESSMENT/PLAN:  ?Assessment: ?Principal Problem: ?  AMS (altered  mental status) ? ? ?Plan: ?[name] is a 88 year old female with past medical history of unspecified dementia starting in 2016, HTN, hx breast cancer s/p bilateral mastectomy, recurrent UTIs with multidrug resistant ESBL E. coli. 2020,  COPD, who resides at [facility] facility where she is bedbound and is fully dependent for her care, who presents to [facility] ED for AMS and after lab work obtained at [facility] showed hypernatremia, with clinical presentation concerning for respiratory infection versus UTI. ?  ?#Multifocal encephalopathy ?#Suspect underlying infection: PNA vs pneumonitis vs UTI ?At time of admission there was concern regarding possible underlying infection (pneumonia versus pneumonitis versus UTI).  Since that time, UA was obtained which showed small hemoglobin and 0-5 RBC, small leukocytes, rare bacteria.  Chest x-ray was negative for new infiltrates or signs of pulmonary edema.  On physical exam she had appropriate oxygen saturations on room air.  Continues to have no leukocytosis, is afebrile.  Blood pressure has been soft with some MAPs below 65.  Hypernatremia gradually improving. I feel her presentation is more likely 2/2 hypernatremia and poor p.o. intake rather than infectious at this time. ?-S/p 1.5 L LR, D5 100 cc/h maintenance ?-Continue to hold oxybutynin ?-Follow-up barium swallow study ?-Continue ceftriaxone ?-Follow-up urine culture ?  ?#Moderate hypernatremia ?Sodium has improved 153 > 146.  Patient has remote history of poor p.o. intake for 1 week  prior to admission.  At admission calculated free water deficit was 2.5 L. ?-S/p 1.5 L LR ?-D5 100cc/hr for 16hrs ?-BMP q4hr ?-Encourage p.o. intake (dysphagia 1, thin liquids) if altered mental status improves ?  ?#Holosystolic murmur appreciated best at apex ?Appreciated murmur on admission, thought to be secondary to hypovolemia in the setting of poor p.o. intake no recent echocardiogram on file. ?-Follow-up  echocardiogram ?  ?#History of dementia, unspecified ?#Hypoalbuminemia ?#Failure to thrive ?Patient has had minimal p.o. intake over the last week and hypoalbuminemia is likely secondary to this.  She has unspecified dementia at baseline and associated dysphagia.  TSH collected and was normal. ?-Delirium precautions ?-Feeding supplement twice daily if patient's mental status improves ?-Zinc oxide ointment applied to sacrum 3 times daily ?  ?#Chronic pain secondary to osteoarthritis ?-Tylenol 650 mg 3 times daily scheduled ?-Voltaren gel 4 times daily scheduled, apply to knees ?-Lidocaine patch daily, apply to lower back ?-Tramadol 50 mg every 6 hours as needed ?-Ibuprofen 400 mg every 6 hours as needed ? ?Best Practice: ?Diet: Dysphagia 1 ?IVF: Fluids: D5W, Rate:  100cc/h ?VTE: enoxaparin (LOVENOX) injection 40 mg Start: 03/15/21 1000 ?Code: DNR ?AB: Ceftriaxone 1g IV daily ?DISPO: Anticipated discharge in 1-2 days pending Medical stability. ? ?Signature: ?Champ Mungo, D.O.  ?Internal Medicine Resident, PGY-1 ?[facility]  ?Pager: [phone] ?8:26 AM, 03/15/2021  ? ?Please contact the on call pager after 5 pm and on weekends at [phone].  ?

## 2021-03-16 ENCOUNTER — Inpatient Hospital Stay (HOSPITAL_COMMUNITY): Payer: Medicare Other

## 2021-03-16 DIAGNOSIS — R011 Cardiac murmur, unspecified: Secondary | ICD-10-CM | POA: Diagnosis not present

## 2021-03-16 LAB — BASIC METABOLIC PANEL
Anion gap: 10 (ref 5–15)
BUN: 25 mg/dL — ABNORMAL HIGH (ref 8–23)
CO2: 26 mmol/L (ref 22–32)
Calcium: 8.3 mg/dL — ABNORMAL LOW (ref 8.9–10.3)
Chloride: 108 mmol/L (ref 98–111)
Creatinine, Ser: 0.85 mg/dL (ref 0.44–1.00)
GFR, Estimated: >60 mL/min (ref >60)
Glucose, Bld: 99 mg/dL (ref 70–99)
Potassium: 3.6 mmol/L (ref 3.5–5.1)
Sodium: 144 mmol/L (ref 135–145)

## 2021-03-16 LAB — ECHOCARDIOGRAM COMPLETE
AR max vel: 1.53 cm2
AV Area VTI: 1.69 cm2
AV Area mean vel: 1.53 cm2
AV Mean grad: 15.5 mmHg
AV Peak grad: 29.9 mmHg
Ao pk vel: 2.74 m/s
Height: 66 in
S' Lateral: 1.6 cm
Weight: 2141.11 oz

## 2021-03-16 LAB — CBC
HCT: 38 % (ref 36.0–46.0)
Hemoglobin: 11.8 g/dL — ABNORMAL LOW (ref 12.0–15.0)
MCH: 29 pg (ref 26.0–34.0)
MCHC: 31.1 g/dL (ref 30.0–36.0)
MCV: 93.4 fL (ref 80.0–100.0)
Platelets: 175 10*3/uL (ref 150–400)
RBC: 4.07 MIL/uL (ref 3.87–5.11)
RDW: 17.4 % — ABNORMAL HIGH (ref 11.5–15.5)
WBC: 7.3 10*3/uL (ref 4.0–10.5)
nRBC: 0 % (ref 0.0–0.2)

## 2021-03-16 LAB — MRSA NEXT GEN BY PCR, NASAL: MRSA by PCR Next Gen: NOT DETECTED

## 2021-03-16 MED ORDER — CARBAMIDE PEROXIDE 6.5 % OT SOLN
5.0000 [drp] | Freq: Two times a day (BID) | OTIC | Status: DC
  Administered 2021-03-16 – 2021-03-18 (×5): 5 [drp] via OTIC
  Filled 2021-03-16: qty 15

## 2021-03-16 NOTE — Progress Notes (Signed)
? ?Subjective:  ? ?Hospital day: 2 ? ?Overnight event: No acute events overnight ? ?Patient evaluated at the bedside laying comfortably in bed.  Patient oriented to self only. She is awake and mumbling.  States she feels fine. ? ?Spoke with daughter in the afternoon who states that patient is much better. She feels like patient is breathing a little bit hard but her O2 levels have been normal. ? ?Objective: ? ?Vital signs in last 24 hours: ?Vitals:  ? 03/15/21 2015 03/15/21 2320 03/16/21 0353 03/16/21 0738  ?BP: 101/62 112/80 131/79 (!) 133/111  ?Pulse: 77 92 94 72  ?Resp: (!) 21 (!) 25 (!) 21 (!) 28  ?Temp: 98 ?F (36.7 ?C) 98.1 ?F (36.7 ?C) 98.2 ?F (36.8 ?C) 98.3 ?F (36.8 ?C)  ?TempSrc: Axillary Axillary Axillary Axillary  ?SpO2: 97% 97% 97% 97%  ?Weight:      ?Height:      ? ? ?Filed Weights  ? 03/14/21 1755 03/15/21 1323  ?Weight: 81 kg 60.7 kg  ? ? ? ?Intake/Output Summary (Last 24 hours) at 03/16/2021 0846 ?Last data filed at 03/15/2021 2200 ?Gross per 24 hour  ?Intake 580 ml  ?Output --  ?Net 580 ml  ? ?Net IO Since Admission: 680 mL [03/16/21 0846] ? ?Recent Labs  ?  03/15/21 ?0204 03/15/21 ?1941 03/15/21 ?7408  ?GLUCAP 40* 118* 72  ?  ? ?Pertinent Labs: ?CBC Latest Ref Rng & Units 03/16/2021 03/15/2021 03/14/2021  ?WBC 4.0 - 10.5 K/uL 7.3 9.5 5.3  ?Hemoglobin 12.0 - 15.0 g/dL 11.8(L) 13.0 12.0  ?Hematocrit 36.0 - 46.0 % 38.0 43.5 41.1  ?Platelets 150 - 400 K/uL 175 161 167  ? ? ?CMP Latest Ref Rng & Units 03/16/2021 03/15/2021 03/15/2021  ?Glucose 70 - 99 mg/dL 99 144(Y) 185(U)  ?BUN 8 - 23 mg/dL 31(S) 22 19  ?Creatinine 0.44 - 1.00 mg/dL 9.70 2.63 7.85  ?Sodium 135 - 145 mmol/L 144 146(H) 138  ?Potassium 3.5 - 5.1 mmol/L 3.6 3.5 5.8(H)  ?Chloride 98 - 111 mmol/L 108 111 109  ?CO2 22 - 32 mmol/L 26 25 20(L)  ?Calcium 8.9 - 10.3 mg/dL 8.3(L) 8.0(L) 6.9(L)  ?Total Protein 6.5 - 8.1 g/dL - - -  ?Total Bilirubin 0.3 - 1.2 mg/dL - - -  ?Alkaline Phos 38 - 126 U/L - - -  ?AST 15 - 41 U/L - - -  ?ALT 0 - 44 U/L - - -   ? ? ?Imaging: ?No results found. ? ?Physical Exam ? ?General: Chronically ill elderly female laying in bed.  No acute distress. ?CV: RRR. No m/r/g. No LE edema ?Pulmonary: Lungs CTAB. Normal effort. No wheezing or rales. ?Abdominal: Soft, nontender, nondistended. Normal bowel sounds. ?Extremities: Radial and DP pulses 2+ and symmetric. ?Skin: Warm and dry. No obvious rash or lesions. ?Neuro: Awake and alert. Oriented to self only. ? ?Assessment/Plan: ?Jennifer [name] is a 88 y.o. female with hx of of unspecified dementia, HTN, hx breast cancer s/p bilateral mastectomy, recurrent UTIs with multidrug resistant ESBL E. coli. 2020,  COPD, who presented with AMS and hypernatremia admitted for concern for infection of unknown source. ? ?Principal Problem: ?  AMS (altered mental status) ?Active Problems: ?  Pressure injury of skin ? ?#Multifocal encephalopathy ?Likely secondary to UTI. Chest x-ray does not show any signs of pneumonia.  Urine cultures still pending. Respiratory status stable. BP has improved with IV fluids. Mental status significantly improved today. Per daughter, patient back to her baseline. Patient tolerating dysphagia 1 diet. ?--  Follow-up urine culture ?--Continue to encourage fluid hydration ?--Continue dysphagia 1 diet ?--Continue IV ceftriaxone, de-escalate tomorrow ?--Likely discharge home tomorrow if remains stable ?--MAP goal >65, IV fluid bolus if MAP drops below goal ?  ?#Moderate hypernatremia ?Sodium has improved from 153 on admission 2 days ago to 144 today. No electrolyte abnormalities. At admission calculated free water deficit was 2.5 L.  Patient tolerating oral intake. ?--Continue oral hydration ?--Daily BMP ?  ?#Holosystolic murmur appreciated best at apex ?TTE today showed EF 55-60%, moderate LVH and moderate PASP, mild to moderate aortic valve stenosis. Respiratory status stable. Slightly dry on exam. ?--CTM ?  ?#History of dementia, unspecified ?#Hypoalbuminemia ?#Failure to  thrive ?Patient has had minimal p.o. intake over the last week and hypoalbuminemia is likely secondary to this.  She has unspecified dementia at baseline and associated dysphagia.  Per daughter, patient eating today and she looks like herself again. ?-Delirium precautions ?-Zinc oxide ointment applied to sacrum 3 times daily ?  ?#Chronic pain secondary to osteoarthritis ?-Tylenol 650 mg 3 times daily scheduled ?-Voltaren gel 4 times daily scheduled, apply to knees ?-Lidocaine patch daily, apply to lower back ?-Tramadol 50 mg every 6 hours as needed ?-Ibuprofen 400 mg every 6 hours as needed ? ?Diet: Dysphagia 1 ?IVF: IV fluid ?VTE: Lovenox ?CODE: DNR ? ?Prior to Admission Living Arrangement: SNF ?Anticipated Discharge Location: Likely SNF ?Barriers to Discharge: Medical stability ?Dispo: Anticipated discharge in approximately 1-2 day(s).  ? ?Signed: ?Steffanie Rainwater, MD ?03/16/2021, 8:46 AM  ?Pager: [phone] ?Internal Medicine Teaching Service ?After 5pm on weekdays and 1pm on weekends: On Call pager: [phone] ? ?

## 2021-03-16 NOTE — Care Management (Signed)
?  Transition of Care (TOC) Screening Note ? ? ?Patient Details  ?Name: Jennifer [name] ?Date of Birth: 07-Aug-1922 ? ? ?Transition of Care (TOC) CM/SW Contact:    ?Lawerance Sabal, RN ?Phone Number: ?03/16/2021, 2:05 PM ? ? ? ?Transition of Care Department [facility]) has reviewed patient and we will continue to monitor patient advancement through interdisciplinary progression rounds.  ? ?Patient resides at [facility] skilled nursing facility where she is bedbound and is fully dependent for her care  ?

## 2021-03-16 NOTE — Progress Notes (Signed)
?  Echocardiogram ?2D Echocardiogram has been performed. ? ?Jennifer [name] ?03/16/2021, 9:03 AM ?

## 2021-03-16 NOTE — Evaluation (Signed)
Clinical/Bedside Swallow Evaluation ?Patient Details  ?Name: Jennifer [name] ?MRN: [id number] ?Date of Birth: 10/10/1922 ? ?Today's Date: 03/16/2021 ?Time: SLP Start Time (ACUTE ONLY): 1150 SLP Stop Time (ACUTE ONLY): 1200 ?SLP Time Calculation (min) (ACUTE ONLY): 10 min ? ?Past Medical History:  ?Past Medical History:  ?Diagnosis Date  ? Anemia   ? iron deficiency post op.  ? CAD (coronary artery disease) 2008  ? Cath 40% mid LAD, 35% RCA  ? Cellulitis of finger   ? right   ? Chest pain 2006  ? Chronic back pain   ? COPD (chronic obstructive pulmonary disease) (HCC)   ? Gastroesophageal reflux disease   ? Hiatal hernia   ? Hyperlipidemia   ? Memory deficit   ? Nicotine addiction   ? Osteoarthritis   ? status post left TKA; surgery on the right is anticipated in the near future   ? Pulmonary nodules   ? stable since 2006  ? Shingles   ? right breast   ? Skin infection   ? Ulcer   ? gential  ? Use of cane as ambulatory aid   ? ?Past Surgical History:  ?Past Surgical History:  ?Procedure Laterality Date  ? ABDOMINAL HYSTERECTOMY    ? BREAST SURGERY N/A   ? bilateral  ? CARDIAC CATHETERIZATION  2008  ? Mid LAD 40%, RCA 25%.   ? CHOLECYSTECTOMY    ? COMBINED HYSTERECTOMY ABDOMINAL W/ A&P REPAIR / OOPHORECTOMY  approx. 40 years ago   ? EYE SURGERY    ? MASTECTOMY  1998 & 2007   ? right -1998 / left 2007  ? right knee replacement  09/2009  ? Dr. Romeo Apple  ? TOTAL KNEE ARTHROPLASTY  4/08  ? left   ? ?HPI:  ?Patient was admitted to the hospital with decreased p.o. intake, hypernatremia and worsening lethargy over the last week.  She was started on ceftriaxone initially for possible underlying infection given her history of recurrent UTIs as well as possible pneumonia diagnosed at SNF.  Given patient's x-ray shows no focal infiltrates and her O2 sats remained in the 90s on room air, low likelihood for PNA.  Patient's UA is also not consistent with a urinary tract infection. Baseline oral dysphagia d/t mentation, on pureed diet  with thin liquids at SNF with full feeding assist.  ?  ?Assessment / Plan / Recommendation  ?Clinical Impression ? Jennifer [name] is well known to ST service from prior hospitalizations. She has a baseline oral dysphagia d/t mentation and consumes pureed solids and thin liquids with total assist for feeding required. CXR remains without PNA. She had limited participation in oral mech exam d/t difficulty following directions, but appears to have normal anatomy without obvious asymmetry. Generalized weakness is appreciated. She was seen with purees and thin liquids via straw. Oral manipulation is slowed, but functional. No s/s of pharyngeal dysphagia. ? ?Continue pureed solids (DYS 1), thin liquids, meds in puree, no further ST indicated as pt is at or near baseline. ? ? ?SLP Visit Diagnosis: Dysphagia, oral phase (R13.11) ?   ?Aspiration Risk ? Mild aspiration risk  ?  ?Diet Recommendation Dysphagia 1 (Puree);Thin liquid  ? ?Liquid Administration via: Straw ?Medication Administration: Crushed with puree ?Supervision: Full supervision/cueing for compensatory strategies;Staff to assist with self feeding ?Compensations: Small sips/bites ?Postural Changes: Seated upright at 90 degrees;Remain upright for at least 30 minutes after po intake  ?  ?Other  Recommendations Oral Care Recommendations: Oral care BID   ? ?Recommendations for  follow up therapy are one component of a multi-disciplinary discharge planning process, led by the attending physician.  Recommendations may be updated based on patient status, additional functional criteria and insurance authorization. ? ?Follow up Recommendations No SLP follow up  ? ? ?  ?   ? ?Prognosis   Good for return to baseline ? ?  ? ?Swallow Study   ?General Date of Onset: 03/15/21 ?HPI: Patient was admitted to the hospital with decreased p.o. intake, hypernatremia and worsening lethargy over the last week.  She was started on ceftriaxone initially for possible underlying infection given  her history of recurrent UTIs as well as possible pneumonia diagnosed at SNF.  Given patient's x-ray shows no focal infiltrates and her O2 sats remained in the 90s on room air, low likelihood for PNA.  Patient's UA is also not consistent with a urinary tract infection. Baseline oral dysphagia d/t mentation, on pureed diet with thin liquids at SNF with full feeding assist. ?Type of Study: Bedside Swallow Evaluation ?Previous Swallow Assessment: 11/2020 BSE: puree/thin with full feeding assist ?Diet Prior to this Study: Dysphagia 1 (puree);Thin liquids ?Temperature Spikes Noted: No ?Respiratory Status: Room air ?History of Recent Intubation: No ?Behavior/Cognition: Alert;Cooperative;Requires cueing ?Oral Cavity Assessment: Within Functional Limits ?Oral Care Completed by SLP: Recent completion by staff ?Oral Cavity - Dentition: Adequate natural dentition ?Vision: Impaired for self-feeding ?Self-Feeding Abilities: Total assist ?Patient Positioning: Upright in bed ?Baseline Vocal Quality: Low vocal intensity ?Volitional Cough: Weak ?Volitional Swallow: Able to elicit  ?  ?Oral/Motor/Sensory Function Overall Oral Motor/Sensory Function: Generalized oral weakness   ?Ice Chips Ice chips: Within functional limits ?Presentation: Spoon   ?Thin Liquid Thin Liquid: Within functional limits ?Presentation: Straw  ?  ?Nectar Thick Nectar Thick Liquid: Not tested   ?Honey Thick Honey Thick Liquid: Not tested   ?Puree Puree: Within functional limits ?Presentation: Spoon   ?Solid ? ? ?  Solid: Within functional limits ?Presentation: Spoon  ? ?  ?Jennifer [name] P. Brodric Schauer, M.S., [name] ?Speech-Language Pathologist ?Acute Rehabilitation Services ?Pager: [phone] ? ?Jennifer [name] P Charley Lafrance ?03/16/2021,12:20 PM ? ? ? ?

## 2021-03-17 DIAGNOSIS — R4182 Altered mental status, unspecified: Secondary | ICD-10-CM | POA: Diagnosis not present

## 2021-03-17 LAB — BASIC METABOLIC PANEL
Anion gap: 10 (ref 5–15)
BUN: 20 mg/dL (ref 8–23)
CO2: 26 mmol/L (ref 22–32)
Calcium: 8 mg/dL — ABNORMAL LOW (ref 8.9–10.3)
Chloride: 109 mmol/L (ref 98–111)
Creatinine, Ser: 0.64 mg/dL (ref 0.44–1.00)
GFR, Estimated: >60 mL/min (ref >60)
Glucose, Bld: 101 mg/dL — ABNORMAL HIGH (ref 70–99)
Potassium: 3 mmol/L — ABNORMAL LOW (ref 3.5–5.1)
Sodium: 145 mmol/L (ref 135–145)

## 2021-03-17 LAB — CBC
HCT: 39.6 % (ref 36.0–46.0)
Hemoglobin: 12.2 g/dL (ref 12.0–15.0)
MCH: 28.5 pg (ref 26.0–34.0)
MCHC: 30.8 g/dL (ref 30.0–36.0)
MCV: 92.5 fL (ref 80.0–100.0)
Platelets: 192 10*3/uL (ref 150–400)
RBC: 4.28 MIL/uL (ref 3.87–5.11)
RDW: 17.2 % — ABNORMAL HIGH (ref 11.5–15.5)
WBC: 6.6 10*3/uL (ref 4.0–10.5)
nRBC: 0 % (ref 0.0–0.2)

## 2021-03-17 LAB — URINE CULTURE: Culture: >=100000 — AB

## 2021-03-17 MED ORDER — ALBUTEROL SULFATE (2.5 MG/3ML) 0.083% IN NEBU: 2.5000 mg | INHALATION_SOLUTION | RESPIRATORY_TRACT | Status: DC | PRN

## 2021-03-17 MED ORDER — POTASSIUM CHLORIDE CRYS ER 20 MEQ PO TBCR
40.0000 meq | EXTENDED_RELEASE_TABLET | Freq: Two times a day (BID) | ORAL | Status: AC
  Administered 2021-03-17 (×2): 40 meq via ORAL
  Filled 2021-03-17 (×2): qty 2

## 2021-03-17 NOTE — Progress Notes (Signed)
? ?HD#2 ?SUBJECTIVE:  ?Patient Summary: Jennifer [name] is a 88 year old female with past medical history of unspecified dementia starting in 2016, HTN, hx breast cancer s/p bilateral mastectomy, recurrent UTIs with multidrug resistant ESBL E. coli. 2020,  COPD, who presented with AMS and hypernatremia admitted for concern for infection of unknown source. ? ?Overnight Events: Rectal pouch placed for continuous diarrhea. ? ?Interim History: Patient wakes and responds to questions this morning. She states that she ate her breakfast and that it was tasty. ? ?OBJECTIVE:  ?Vital Signs: ?Vitals:  ? 03/16/21 2340 03/17/21 0000 03/17/21 0200 03/17/21 0400  ?BP: (!) 149/88 (!) 117/55  137/70  ?Pulse: 100  72 97  ?Resp: (!) 24 (!) 22 (!) 22 20  ?Temp: 97.9 ?F (36.6 ?C)   97.7 ?F (36.5 ?C)  ?TempSrc: Axillary   Axillary  ?SpO2:  98% 100% 100%  ?Weight:      ?Height:      ? ?Supplemental O2: Room Air ?SpO2: 100 % ?O2 Flow Rate (L/min): 3 L/min ? ?Filed Weights  ? 03/14/21 1755 03/15/21 1323  ?Weight: 81 kg 60.7 kg  ? ? ? ?Intake/Output Summary (Last 24 hours) at 03/17/2021 0726 ?Last data filed at 03/17/2021 0600 ?Gross per 24 hour  ?Intake 410 ml  ?Output 350 ml  ?Net 60 ml  ? ?Net IO Since Admission: 740 mL [03/17/21 0726] ? ?Physical Exam: ?Constitutional:Chronically ill appearing female. No acute distress. ?Cardio:Regular rate and rhythm.  ?Pulm:Clear to auscultation on anterior auscultation. Appropriate O2 saturations on room air. ?Abdomen:Soft, non-tender, non-distended. ?QF:[id number] for extremity edema. ?Skin:Warm and dry.  ?Neuro:Lethargic but wakes up and raises head during conversation and answers yes/no questions. ? ?Patient Lines/Drains/Airways Status   ? ? Active Line/Drains/Airways   ? ? Name Placement date Placement time Site Days  ? Peripheral IV 01/10/21 20 G 1" Left Antecubital 01/10/21  0948  Antecubital  66  ? Peripheral IV 03/15/21 22 G Left;Posterior Hand 03/15/21  --  Hand  2  ? External Urinary Catheter  03/16/21  2215  --  1  ? Pressure Injury 03/15/21 Sacrum Mid Stage 2 -  Partial thickness loss of dermis presenting as a shallow open injury with a red, pink wound bed without slough. 03/15/21  1331  -- 2  ? ?  ?  ? ?  ? ? ? ?ASSESSMENT/PLAN:  ?Assessment: ?Principal Problem: ?  AMS (altered mental status) ?Active Problems: ?  Pressure injury of skin ? ? ?Plan: ?MEKKA BLUMENBERG is a 88 year old female with past medical history of unspecified dementia starting in 2016, HTN, hx breast cancer s/p bilateral mastectomy, recurrent UTIs with multidrug resistant ESBL E. coli. 2020,  COPD, who presented with AMS and hypernatremia admitted for concern for infection of unknown source. ?  ?#Multifocal encephalopathy ?#Suspect underlying infection: PNA vs pneumonitis vs UTI ?Patient is hemodynamically stable without fever or leukocytosis. She has received IV ceftriaxone 1 g for three days thus far. Urine culture re-incubated for better growth. She does have new diarrhea overnight requiring rectal pouch. ?-Stool sample collected to rule out infection vs. Side effects from IV antibiotics ?-Continue to hold oxybutynin ?-Stop ceftriaxone ?-Follow-up urine culture ?  ?#Moderate hypernatremia ?Sodium is stable at 145.  She is not currently on continuous IVF.  ?-Trend BMP ?-Encourage p.o. intake (dysphagia 1, thin liquids) ?-Low threshold for IVF resuscitation given new diarrhea ?  ?#Holosystolic murmur appreciated best at apex ?Appreciated murmur on admission, thought to be secondary to hypovolemia in the setting of poor  p.o. intake. Echocardiogram 03/05 showed LV EF 55-60% with normal function, no regional wall motion abnormalities, moderate hypertrophy; RV systolic function normal with normal size and moderately elevated pulmonary artery systolic pressure; mildly dilated LA; trivial mitral valve regurgitation; mild aortic valve regurgitation and stenosis. ?-CTM  ?  ?#History of dementia, unspecified ?#Hypoalbuminemia ?#Failure to  thrive ?Patient has had minimal p.o. intake over the last week and hypoalbuminemia is likely secondary to this.  She has unspecified dementia at baseline and associated dysphagia.  TSH collected and was normal. ?-Delirium precautions ?-Feeding supplement twice daily if patient's mental status improves ?-Zinc oxide ointment applied to sacrum 3 times daily ?  ?#Chronic pain secondary to osteoarthritis ?-Tylenol 650 mg 3 times daily scheduled ?-Voltaren gel 4 times daily scheduled, apply to knees ?-Lidocaine patch daily, apply to lower back ?-Tramadol 50 mg every 6 hours as needed ?-Ibuprofen 400 mg every 6 hours as needed ? ?Best Practice: ?Diet: Dysphagia 1 ?IVF: Fluids: D5W, Rate:  100cc/h ?VTE: enoxaparin (LOVENOX) injection 40 mg Start: 03/15/21 1000 ?Code: DNR ?AB: Ceftriaxone 1g IV daily ?DISPO: Anticipated discharge in 1-2 days pending Medical stability. ? ?Signature: ?Jennifer [name], D.O.  ?Internal Medicine Resident, PGY-1 ?[facility]  ?Pager: [phone] ?7:26 AM, 03/17/2021  ? ?Please contact the on call pager after 5 pm and on weekends at [phone]. ? ?

## 2021-03-17 NOTE — Plan of Care (Signed)
Daughter stated that today, patient seemed more at her baseline per previous RN. Patient unable to express needs to me.  ?Problem: Education: ?Goal: Knowledge of General Education information will improve ?Description: Including pain rating scale, medication(s)/side effects and non-pharmacologic comfort measures ?Outcome: Progressing ?  ?Problem: Health Behavior/Discharge Planning: ?Goal: Ability to manage health-related needs will improve ?Outcome: Progressing ?  ?Problem: Clinical Measurements: ?Goal: Ability to maintain clinical measurements within normal limits will improve ?Outcome: Progressing ?Goal: Will remain free from infection ?Outcome: Progressing ?Goal: Diagnostic test results will improve ?Outcome: Progressing ?Goal: Respiratory complications will improve ?Outcome: Progressing ?Goal: Cardiovascular complication will be avoided ?Outcome: Progressing ?  ?Problem: Activity: ?Goal: Risk for activity intolerance will decrease ?Outcome: Progressing ?  ?Problem: Nutrition: ?Goal: Adequate nutrition will be maintained ?Outcome: Progressing ?  ?Problem: Elimination: ?Goal: Will not experience complications related to bowel motility ?Outcome: Progressing ?Goal: Will not experience complications related to urinary retention ?Outcome: Progressing ?  ?Problem: Safety: ?Goal: Ability to remain free from injury will improve ?Outcome: Progressing ?  ?

## 2021-03-17 NOTE — TOC Initial Note (Signed)
Transition of Care (TOC) - Initial/Assessment Note  ? ? ?Patient Details  ?Name: Jennifer [name] ?MRN: [id number] ?Date of Birth: 1922/07/27 ? ?Transition of Care (TOC) CM/SW Contact:    ?Mearl Latin, LCSW ?Phone Number: ?03/17/2021, 9:51 AM ? ?Clinical Narrative:                 ?CSW made [facility]) aware that patient may be ready for discharge today. CSW completed FL2.  ? ?Expected Discharge Plan: Skilled Nursing Facility ?Barriers to Discharge: Continued Medical Work up ? ? ?Patient Goals and CMS Choice ?Patient states their goals for this hospitalization and ongoing recovery are:: Return to SNF ?CMS Medicare.gov Compare Post Acute Care list provided to:: Patient Represenative (must comment) ?Choice offered to / list presented to : Adult Children ? ?Expected Discharge Plan and Services ?Expected Discharge Plan: Skilled Nursing Facility ?In-house Referral: Clinical Social Work ?  ?Post Acute Care Choice: Skilled Nursing Facility ?Living arrangements for the past 2 months: Skilled Nursing Facility ?                ?  ?  ?  ?  ?  ?  ?  ?  ?  ?  ? ?Prior Living Arrangements/Services ?Living arrangements for the past 2 months: Skilled Nursing Facility ?Lives with:: Facility Resident ?Patient language and need for interpreter reviewed:: Yes ?Do you feel safe going back to the place where you live?: Yes      ?Need for Family Participation in Patient Care: Yes (Comment) ?Care giver support system in place?: Yes (comment) ?Current home services: DME ?Criminal Activity/Legal Involvement Pertinent to Current Situation/Hospitalization: No - Comment as needed ? ?Activities of Daily Living ?  ?  ? ?Permission Sought/Granted ?Permission sought to share information with : Facility Medical sales representative, Family Supports ?Permission granted to share information with : No ? Share Information with NAME: Ware,Gweyndolyn: Daughter, [phone] ? Permission granted to share info w AGENCY: [facility] ?   ?    ? ?Emotional Assessment ?Appearance:: Appears stated age ?Attitude/Demeanor/Rapport: Unable to Assess ?Affect (typically observed): Unable to Assess ?Orientation: : Oriented to Self ?Alcohol / Substance Use: Not Applicable ?Psych Involvement: No (comment) ? ?Admission diagnosis:  Hypernatremia [E87.0] ?Altered mental status, unspecified altered mental status type [R41.82] ?AMS (altered mental status) [R41.82] ?Patient Active Problem List  ? Diagnosis Date Noted  ? Pressure injury of skin 03/15/2021  ? AMS (altered mental status) 03/14/2021  ? Encephalopathy 11/24/2020  ? Acute encephalopathy 11/23/2020  ? COVID-19 virus infection 11/23/2020  ? UTI due to extended-spectrum beta lactamase (ESBL) producing Escherichia coli 09/30/2018  ? Lower urinary tract infectious disease 09/28/2018  ? Elevated blood pressure, situational 04/26/2018  ? Positive blood culture 04/26/2018  ? Shingles (herpes zoster) polyneuropathy 03/11/2018  ? Advanced care planning/counseling discussion   ? Palliative care by specialist   ? Goals of care, counseling/discussion   ? Altered mental status 01/24/2018  ? Frequent falls 01/24/2018  ? Burnout of caregiver 07/21/2016  ? DNR no code (do not resuscitate) 04/25/2014  ? HTN (hypertension) 04/22/2014  ? Urinary incontinence 04/04/2013  ? Osteoporosis, post-menopausal 12/06/2012  ? At high risk for falls 07/30/2012  ? Dementia without behavioral disturbance (HCC) 08/19/2011  ? COPD (chronic obstructive pulmonary disease) (HCC) 06/25/2011  ? Allergic rhinitis 12/23/2010  ? CEREBROVASCULAR DISEASE 08/05/2009  ? Spinal stenosis 06/09/2007  ? Coronary atherosclerosis 01/25/2007  ? HIATAL HERNIA WITH REFLUX 01/25/2007  ? Osteoarthritis 01/25/2007  ? ?PCP:  Pcp, No ?Pharmacy:   ?  [facility] - [place], [place] - [address] ?[address] ?[place] [address] ?Phone: [phone] Fax: [phone] ? ? ? ? ?Social Determinants of Health (SDOH) Interventions ?  ? ?Readmission  Risk Interventions ?No flowsheet data found. ? ? ?

## 2021-03-17 NOTE — Progress Notes (Signed)
Reviewed patient labs, K+ 3.0, notified Dr. Sharene Butters.  ?

## 2021-03-17 NOTE — NC FL2 (Signed)
?[place] MEDICAID FL2 LEVEL OF CARE SCREENING TOOL  ?  ? ?IDENTIFICATION  ?Patient Name: ?Jennifer [name] Birthdate: 05/17/1922 Sex: female Admission Date (Current Location): ?03/14/2021  ?[place] and [place] Number: ? [place] ?  Facility and Address:  ?[facility], [place] [address], [place], [place] [address] ?     Provider Number: ?[id number]  ?Attending Physician Name and Address:  ?Aldine Contes, MD ? Relative Name and Phone Number:  ?  ?   ?Current Level of Care: ?Hospital Recommended Level of Care: ?[employer] Prior Approval Number: ?  ? ?Date Approved/Denied: ?  PASRR Number: ?[id number] ? ?Discharge Plan: ?SNF ?  ? ?Current Diagnoses: ?Patient Active Problem List  ? Diagnosis Date Noted  ? Pressure injury of skin 03/15/2021  ? AMS (altered mental status) 03/14/2021  ? Encephalopathy 11/24/2020  ? Acute encephalopathy 11/23/2020  ? COVID-19 virus infection 11/23/2020  ? UTI due to extended-spectrum beta lactamase (ESBL) producing Escherichia coli 09/30/2018  ? Lower urinary tract infectious disease 09/28/2018  ? Elevated blood pressure, situational 04/26/2018  ? Positive blood culture 04/26/2018  ? Shingles (herpes zoster) polyneuropathy 03/11/2018  ? Advanced care planning/counseling discussion   ? Palliative care by specialist   ? Goals of care, counseling/discussion   ? Altered mental status 01/24/2018  ? Frequent falls 01/24/2018  ? Burnout of caregiver 07/21/2016  ? DNR no code (do not resuscitate) 04/25/2014  ? HTN (hypertension) 04/22/2014  ? Urinary incontinence 04/04/2013  ? Osteoporosis, post-menopausal 12/06/2012  ? At high risk for falls 07/30/2012  ? Dementia without behavioral disturbance ([place]) 08/19/2011  ? COPD (chronic obstructive pulmonary disease) ([place]) 06/25/2011  ? Allergic rhinitis 12/23/2010  ? CEREBROVASCULAR DISEASE 08/05/2009  ? Spinal stenosis 06/09/2007  ? Coronary atherosclerosis 01/25/2007  ? HIATAL HERNIA WITH REFLUX 01/25/2007  ?  Osteoarthritis 01/25/2007  ? ? ?Orientation RESPIRATION BLADDER Height & Weight   ?  ?Self ? Normal Incontinent, External catheter Weight: 133 lb 13.1 oz (60.7 kg) ?Height:  5\' 6"  (167.6 cm)  ?BEHAVIORAL SYMPTOMS/MOOD NEUROLOGICAL BOWEL NUTRITION STATUS  ?    Incontinent Diet (See dc summary)  ?AMBULATORY STATUS COMMUNICATION OF NEEDS Skin   ?Extensive Assist Verbally PU Stage and Appropriate Care (Stage II on sacrum w/foam dressing) ?  ?  ?  ?    ?     ?     ? ? ?Personal Care Assistance Level of Assistance  ?Bathing, Feeding, Dressing Bathing Assistance: Maximum assistance ?Feeding assistance: Maximum assistance ?Dressing Assistance: Maximum assistance ?   ? ?Functional Limitations Info  ?Hearing, Sight Sight Info: Impaired ?Hearing Info: Impaired ?   ? ? ?SPECIAL CARE FACTORS FREQUENCY  ?    ?  ?  ?  ?  ?  ?  ?   ? ? ?Contractures Contractures Info: Not present  ? ? ?Additional Factors Info  ?Code Status, Allergies Code Status Info: DNR ?Allergies Info: Aricept (Donepezil Hcl), Iohexol, Iron, Penicillins ?  ?  ?  ?   ? ?Current Medications (03/17/2021):  This is the current hospital active medication list ?Current Facility-Administered Medications  ?Medication Dose Route Frequency Provider Last Rate Last Admin  ? (feeding supplement) PROSource Plus liquid 30 mL  30 mL Oral BID BM Dareen Piano, [place]isch[place], MD   30 mL at 03/17/21 0802  ? acetaminophen (TYLENOL) tablet 650 mg  650 mg Oral TID Maudie Mercury, MD   650 mg at 03/17/21 0802  ? carbamide peroxide (DEBROX) 6.5 % OTIC (EAR) solution 5  drop  5 drop Both EARS BID Lacinda Axon, MD   5 drop at 03/17/21 0802  ? cefTRIAXone (ROCEPHIN) 1 g in sodium chloride 0.9 % 100 mL IVPB  1 g Intravenous Q24H Farrel Gordon, DO   Stopped at 03/16/21 2130  ? diclofenac Sodium (VOLTAREN) 1 % topical gel 4 g  4 g Topical QID Aldine Contes, MD   4 g at 03/17/21 0803  ? enoxaparin (LOVENOX) injection 40 mg  40 mg Subcutaneous Q24H Maudie Mercury, MD   40 mg at 03/17/21 0802  ?  ibuprofen (ADVIL) tablet 400 mg  400 mg Oral Q6H PRN Maudie Mercury, MD      ? lidocaine (LIDODERM) 5 % 1 patch  1 patch Transdermal Q24H Maudie Mercury, MD   1 patch at 03/16/21 2352  ? MEDLINE mouth rinse  15 mL Mouth Rinse BID Aldine Contes, MD   15 mL at 03/17/21 0803  ? olopatadine (PATANOL) 0.1 % ophthalmic solution 1 drop  1 drop Both Eyes BID Maudie Mercury, MD   1 drop at 03/17/21 0803  ? polyvinyl alcohol (LIQUIFILM TEARS) 1.4 % ophthalmic solution 1 drop  1 drop Both Eyes BID Maudie Mercury, MD   1 drop at 03/17/21 0803  ? traMADol (ULTRAM) tablet 50 mg  50 mg Oral Q6H PRN Maudie Mercury, MD   50 mg at 03/15/21 0125  ? zinc oxide 20 % ointment 1 application  1 application Topical TID PRN Maudie Mercury, MD      ? ? ? ?Discharge Medications: ?Please see discharge summary for a list of discharge medications. ? ?Relevant Imaging Results: ? ?Relevant Lab Results: ? ? ?Additional Information ?[id number]. Has received 5 Moderna vaccines. ? ?Benard Halsted, LCSW ? ? ? ? ?

## 2021-03-18 DIAGNOSIS — R4182 Altered mental status, unspecified: Secondary | ICD-10-CM | POA: Diagnosis not present

## 2021-03-18 LAB — BASIC METABOLIC PANEL
Anion gap: 9 (ref 5–15)
BUN: 24 mg/dL — ABNORMAL HIGH (ref 8–23)
CO2: 26 mmol/L (ref 22–32)
Calcium: 8.2 mg/dL — ABNORMAL LOW (ref 8.9–10.3)
Chloride: 114 mmol/L — ABNORMAL HIGH (ref 98–111)
Creatinine, Ser: 0.69 mg/dL (ref 0.44–1.00)
GFR, Estimated: >60 mL/min (ref >60)
Glucose, Bld: 101 mg/dL — ABNORMAL HIGH (ref 70–99)
Potassium: 3.9 mmol/L (ref 3.5–5.1)
Sodium: 149 mmol/L — ABNORMAL HIGH (ref 135–145)

## 2021-03-18 LAB — GASTROINTESTINAL PANEL BY PCR, STOOL (REPLACES STOOL CULTURE)

## 2021-03-18 LAB — CBC
HCT: 41.4 % (ref 36.0–46.0)
Hemoglobin: 12.6 g/dL (ref 12.0–15.0)
MCH: 28.4 pg (ref 26.0–34.0)
MCHC: 30.4 g/dL (ref 30.0–36.0)
MCV: 93.2 fL (ref 80.0–100.0)
Platelets: 196 10*3/uL (ref 150–400)
RBC: 4.44 MIL/uL (ref 3.87–5.11)
RDW: 17.6 % — ABNORMAL HIGH (ref 11.5–15.5)
WBC: 8 10*3/uL (ref 4.0–10.5)
nRBC: 0.3 % — ABNORMAL HIGH (ref 0.0–0.2)

## 2021-03-18 MED ORDER — ALBUTEROL SULFATE (2.5 MG/3ML) 0.083% IN NEBU: 2.5000 mg | INHALATION_SOLUTION | RESPIRATORY_TRACT | 12 refills | Status: AC | PRN

## 2021-03-18 NOTE — Plan of Care (Signed)
DISCHARGE NOTE SNF ?Jennifer [name] to be discharged [facility] per MD order. Patient verbalized understanding. ? ?Skin clean, dry and intact without evidence of skin break down, no evidence of skin tears noted. IV catheter discontinued intact. Site without signs and symptoms of complications. Dressing and pressure applied. Pt denies pain at the site currently. No complaints noted. ? ?Patient free of lines, drains, and wounds.  ? ?Discharge packet assembled. An After Visit Summary (AVS) was printed and placed in discharge packet. Patient to be discharged to designated Skilled Nursing Facility via ambulance. Report called to accepting facility; all questions and concerns addressed.  ? ?Arlice Colt, RN  ?

## 2021-03-18 NOTE — Plan of Care (Signed)
?  Problem: Clinical Measurements: ?Goal: Will remain free from infection ?Outcome: Progressing ?Goal: Respiratory complications will improve ?Outcome: Progressing ?Goal: Cardiovascular complication will be avoided ?Outcome: Progressing ?  ?Problem: Coping: ?Goal: Level of anxiety will decrease ?Outcome: Progressing ?  ?Problem: Elimination: ?Goal: Will not experience complications related to urinary retention ?Outcome: Progressing ?  ?Problem: Pain Managment: ?Goal: General experience of comfort will improve ?Outcome: Progressing ?  ?Problem: Safety: ?Goal: Ability to remain free from injury will improve ?Outcome: Progressing ?  ?Problem: Education: ?Goal: Knowledge of General Education information will improve ?Description: Including pain rating scale, medication(s)/side effects and non-pharmacologic comfort measures ?Outcome: Not Progressing ?  ?Problem: Health Behavior/Discharge Planning: ?Goal: Ability to manage health-related needs will improve ?Outcome: Not Progressing ?  ?Problem: Clinical Measurements: ?Goal: Ability to maintain clinical measurements within normal limits will improve ?Outcome: Not Progressing ?Goal: Diagnostic test results will improve ?Outcome: Not Progressing ?  ?Problem: Activity: ?Goal: Risk for activity intolerance will decrease ?Outcome: Not Progressing ?  ?Problem: Nutrition: ?Goal: Adequate nutrition will be maintained ?Outcome: Not Progressing ?  ?Problem: Elimination: ?Goal: Will not experience complications related to bowel motility ?Outcome: Not Progressing ?  ?Problem: Skin Integrity: ?Goal: Risk for impaired skin integrity will decrease ?Outcome: Not Progressing ?  ?

## 2021-03-18 NOTE — Progress Notes (Deleted)
Physical Therapy Treatment ?Patient Details ?Name: Jennifer [name] ?MRN: [id number] ?DOB: 27-Aug-1922 ?Today's Date: 03/18/2021 ? ? ?History of Present Illness In brief, patient is a 88 year old female presented to the ED with hypernatremia on outpatient labs,AMS,multifocal encephalopathy. PHMx: dementia, hypertension, breast cancer status post bilateral mastectomy, recurrent UTIs, COPD, CAD, COVID 3 months ago ? ?  ?PT Comments  ? ? Pt admitted with above diagnosis. Pt unable to participate much today due to lethargy.  Pt did not follow any commands and did not open eyes much during session.  PEr OT, daughter states pt was getting PT a few weeks ago at facility. REcommend a trial of PT once pt is back to facility.  Ordered Prevalon boots to protect pts heels as well.   Pt currently with functional limitations due to the deficits listed below (see PT Problem List). Pt will benefit from skilled PT to increase their independence and safety with mobility to allow discharge to the venue listed below.      ?Recommendations for follow up therapy are one component of a multi-disciplinary discharge planning process, led by the attending physician.  Recommendations may be updated based on patient status, additional functional criteria and insurance authorization. ? ?Follow Up Recommendations ? Skilled nursing-short term rehab (<3 hours/day) (Trial of PT) ?  ?  ?Assistance Recommended at Discharge Frequent or constant Supervision/Assistance  ?Patient can return home with the following Two people to help with walking and/or transfers;Two people to help with bathing/dressing/bathroom;Assistance with feeding;Assistance with cooking/housework;Direct supervision/assist for medications management;Direct supervision/assist for financial management;Help with stairs or ramp for entrance ?  ?Equipment Recommendations ? None recommended by PT  ?  ?Recommendations for Other Services   ? ? ?  ?Precautions / Restrictions  Precautions ?Precautions: Fall ?Restrictions ?Weight Bearing Restrictions: No  ?  ? ?Mobility ? Bed Mobility ?Overal bed mobility: Needs Assistance ?Bed Mobility: Rolling ?Rolling: Total assist ?  ?  ?  ?  ?General bed mobility comments: total A ?  ? ?Transfers ?  ?  ?  ?  ?  ?  ?  ?  ?  ?General transfer comment: hoyer OOB pta ?  ? ?Ambulation/Gait ?  ?  ?  ?  ?  ?  ?  ?  ? ? ?Stairs ?  ?  ?  ?  ?  ? ? ?Wheelchair Mobility ?  ? ?Modified Rankin (Stroke Patients Only) ?  ? ? ?  ?Balance   ?  ?  ?  ?  ?  ?  ?  ?  ?  ?  ?  ?  ?  ?  ?  ?  ?  ?  ?  ? ?  ?Cognition Arousal/Alertness: Awake/alert ?Behavior During Therapy: Flat affect ?Overall Cognitive Status: History of cognitive impairments - at baseline ?  ?  ?  ?  ?  ?  ?  ?  ?  ?  ?  ?  ?  ?  ?  ?  ?General Comments: HOH (hears better out of left ear) ?  ?  ? ?  ?Exercises General Exercises - Lower Extremity ?Ankle Circles/Pumps: PROM, Both, 5 reps, Supine ?Heel Slides: PROM, Both, 5 reps, Supine ?Hip ABduction/ADduction: PROM, Both, 5 reps, Supine ?Straight Leg Raises: PROM, Both, 5 reps, Supine ? ?  ?General Comments General comments (skin integrity, edema, etc.): 91 bpm, 148/77 ?  ?  ? ?Pertinent Vitals/Pain Pain Assessment ?Pain Assessment: No/denies pain ?Breathing: normal ?Negative Vocalization: none ?Facial Expression: smiling or inexpressive ?  Body Language: relaxed ?Consolability: no need to console ?PAINAD Score: 0  ? ? ?Home Living Family/patient expects to be discharged to:: Skilled nursing facility ?  ?  ?  ?  ?  ?  ?  ?  ?  ?Additional Comments: From [facility]  ?  ?Prior Function    ?  ?  ?   ? ?PT Goals (current goals can now be found in the care plan section) Acute Rehab PT Goals ?Patient Stated Goal: unable to state ?PT Goal Formulation: Patient unable to participate in goal setting ?Time For Goal Achievement: 04/01/21 ?Potential to Achieve Goals: Fair ? ?  ?Frequency ? ? ? Min 2X/week ? ? ? ?  ?PT Plan    ? ? ?Co-evaluation   ?  ?  ?  ?   ? ?  ?AM-PAC PT "6 Clicks" Mobility   ?Outcome Measure ? Help needed turning from your back to your side while in a flat bed without using bedrails?: Total ?Help needed moving from lying on your back to sitting on the side of a flat bed without using bedrails?: Total ?Help needed moving to and from a bed to a chair (including a wheelchair)?: Total ?Help needed standing up from a chair using your arms (e.g., wheelchair or bedside chair)?: Total ?Help needed to walk in hospital room?: Total ?Help needed climbing 3-5 steps with a railing? : Total ?6 Click Score: 6 ? ?  ?End of Session   ?Activity Tolerance: Patient limited by fatigue;Patient limited by lethargy ?Patient left: in bed;with call bell/phone within reach;with bed alarm set ?Nurse Communication: Mobility status;Need for lift equipment ?PT Visit Diagnosis: Muscle weakness (generalized) (M62.81) ?  ? ? ?Time: 1610-9604 ?PT Time Calculation (min) (ACUTE ONLY): 15 min ? ?Charges:             ?          ? ?Jennifer [name] M,PT ?Acute Rehab Services ?[phone] ?[phone] (pager)  ? ? ?Jennifer [name] ?03/18/2021, 9:30 AM ? ?

## 2021-03-18 NOTE — Progress Notes (Signed)
Patient left via stretcher in care of [facility] staff.  Patient remained alert and opens eyes spontaneously but does not follow commands as reported to me by previous RN and no distress was noted at time of departure.   ?

## 2021-03-18 NOTE — Hospital Course (Signed)
#  Multifocal encephalopathy ?#UTI ?Patient presented with several days of AMS. On presentation she had hypernatremia to 153. She was hemodynamically stable without leukocytosis and was afebrile. Chest-ray showed no focal opacities but she did have new oxygen requirement to 3 LNC as well as peripheral cyanosis and rales anteriorly. She received ceftriaxone and 50cc LR bolus in the ED and ceftriaxone was continued on admission. Oxybutynin was held. UA was obtained which showed small hemoglobin and 0-5 RBC, small leukocytes, rare bacteria. She did have hypotension on hospital day 1 but responded appropriately to 1.5L IVF. She had several episodes of diarrhea prompting placement of rectal pouch and collection of GI panel which at time of discharge has not yet resulted. Rectal pouch removed 2/2 improvement of diarrhea. Urine culture was positive for Aerococcus species which is covered by ceftriaxone, which she received three days of ending on 03/06.  ?  ?#Moderate hypernatremia ?At admission patient had hypernatremia at 153. She was started on D5 100 cc/h. Sodium gradually improved with improved hydration. ? ?#Holosystolic murmur appreciated best at apex ?Murmur was appreciated on admission physical exam. Echocardiogram was obtained and showed LV EF 55-60% with normal function, no regional wall motion abnormalities, moderate hypertrophy; RV systolic function normal with normal size and moderately elevated pulmonary artery systolic pressure; mildly dilated LA; trivial mitral valve regurgitation; mild aortic valve regurgitation and stenosis. ?  ?#History of dementia, unspecified ?#Hypoalbuminemia ?#Failure to thrive ?TSH was checked and normal. Managed with delirium precautions and feeding supplement. ?  ?#Chronic pain secondary to osteoarthritis ?Chronic condition. Continued on home tylenol, voltaren gel, lidocaine patch daily, tramadol, ibuprofen. ?

## 2021-03-18 NOTE — Progress Notes (Signed)
Orthopedic Tech Progress Note ?Patient Details:  ?Jennifer [name] ?04/17/1922 ?[id number] ? ?Spoke with Diplomatic Services operational officer and she's ordering those from materials, we do not carry those. ? ? Patient ID: Jennifer [name], female   DOB: 06-Jul-1922, 88 y.o.   MRN: [id number] ? ?Jennifer [name] ?03/18/2021, 9:28 AM ? ?

## 2021-03-18 NOTE — Progress Notes (Signed)
PT Cancellation Note ? ?Patient Details ?Name: Jennifer [name] ?MRN: [id number] ?DOB: 1922/11/07 ? ? ?Cancelled Treatment:    Reason Eval/Treat Not Completed: Other (comment) (Chart states pt is bedbound and was dependent PTA.  Not appropriate for PT to evaluate.) ? ? ?[name] ?03/18/2021, 8:52 AM ?Alvis Lemmings M,PT ?Acute Rehab Services ?[phone] ?[phone] (pager)  ?

## 2021-03-18 NOTE — TOC Transition Note (Signed)
Transition of Care (TOC) - CM/SW Discharge Note ? ? ?Patient Details  ?Name: Jennifer [name] ?MRN: [id number] ?Date of Birth: 07/30/1922 ? ?Transition of Care (TOC) CM/SW Contact:  ?Mearl Latin, LCSW ?Phone Number: ?03/18/2021, 1:17 PM ? ? ?Clinical Narrative:    ?Patient will DC to: [facility] [employer]) ?Anticipated DC date: 03/18/21 ?Family notified: Daughter, [name] ?Transport by: Sharin Mons  ? ? ?Per MD patient ready for DC to [facility]. RN to call report prior to discharge [phone]). RN, patient, patient's family, and facility notified of DC. Discharge Summary and FL2 sent to facility. No covid test needed. DC packet on chart. Ambulance transport requested for patient.  ? ?CSW will sign off for now as social work intervention is no longer needed. Please consult [place] again if new needs arise. ? ? ? ? ?Final next level of care: Skilled Nursing Facility ?Barriers to Discharge: Barriers Resolved ? ? ?Patient Goals and CMS Choice ?Patient states their goals for this hospitalization and ongoing recovery are:: Return to SNF ?CMS Medicare.gov Compare Post Acute Care list provided to:: Patient Represenative (must comment) ?Choice offered to / list presented to : Adult Children ? ?Discharge Placement ?  ?           ?  ?  ?  ?  ? ?Discharge Plan and Services ?In-house Referral: Clinical Social Work ?  ?Post Acute Care Choice: Skilled Nursing Facility          ?  ?  ?  ?  ?  ?  ?  ?  ?  ?  ? ?Social Determinants of Health (SDOH) Interventions ?  ? ? ?Readmission Risk Interventions ?No flowsheet data found. ? ? ? ? ?

## 2021-03-18 NOTE — Discharge Summary (Addendum)
Name: Jennifer [name] MRN: [id number] DOB: 1922/04/08 88 y.o. PCP: Pcp, No  Date of Admission: 03/14/2021  5:49 PM Date of Discharge:  03/18/2021 Attending Physician: Dr. Criselda Peaches  DISCHARGE DIAGNOSIS:  Primary Problem: AMS (altered mental status)   Hospital Problems: Principal Problem:   AMS (altered mental status) Active Problems:   Pressure injury of skin    DISCHARGE MEDICATIONS:   Allergies as of 03/18/2021       Reactions   Aricept [donepezil Hcl] Other (See Comments)   confusion   Iohexol     Desc: PT ALLERGIC TO CONTRAST- SHE CAN'T BREATH   Iron Other (See Comments)   unknown   Penicillins    Has patient had a PCN reaction causing immediate rash, facial/tongue/throat swelling, SOB or lightheadedness with hypotension:  unknown Has patient had a PCN reaction causing severe rash involving mucus membranes or skin necrosis: unknown Has patient had a PCN reaction that required hospitalization unknown Has patient had a PCN reaction occurring within the last 10 years: unknown If all of the above answers are "NO", then may proceed with Cephalosporin use.        Medication List     STOP taking these medications    sulfamethoxazole-trimethoprim 800-160 MG tablet Commonly known as: BACTRIM DS       TAKE these medications    acetaminophen 325 MG tablet Commonly known as: TYLENOL Take 650 mg by mouth 3 (three) times daily.   albuterol (2.5 MG/3ML) 0.083% nebulizer solution Commonly known as: PROVENTIL Take 3 mLs (2.5 mg total) by nebulization every 4 (four) hours as needed for wheezing or shortness of breath.   Cranberry 500 MG Tabs Take 500 mg by mouth daily.   Debrox 6.5 % OTIC solution Generic drug: carbamide peroxide Place 10 drops into both ears See admin instructions. Twice a day on the 4th, 5th, 6th,7th of every 3rd month   dextrose 5 % and 0.9% NaCl 5-0.9 % infusion Inject 50 mL/hr into the vein continuous. 50 ml/hr x 1 L every shift   diclofenac  sodium 1 % Gel Commonly known as: VOLTAREN Apply to both knees QID PRN What changed:  how much to take how to take this when to take this additional instructions   feeding supplement (PRO-STAT SUGAR FREE 64) Liqd Take 30 mLs by mouth in the morning and at bedtime.   FORTIFY PROBIOTIC WOMENS PO Take 1 capsule by mouth daily.   geriatric multivitamins-minerals Liqd Take 15 mLs by mouth daily.   Imvexxy Maintenance Pack 4 MCG Inst Generic drug: Estradiol Place 4 mcg vaginally 2 (two) times a week. Monday's and Thursday's   lidocaine 5 % Commonly known as: LIDODERM Place 1 patch onto the skin daily. Remove & Discard patch within 12 hours or as directed by MD What changed: additional instructions   Olopatadine HCl 0.2 % Soln Place 1 drop into both eyes daily.   OVER THE COUNTER MEDICATION Take 1 Container by mouth every evening. Magic Cup   oxybutynin 5 MG 24 hr tablet Commonly known as: DITROPAN-XL Take 5 mg by mouth at bedtime.   polyvinyl alcohol 1.4 % ophthalmic solution Commonly known as: LIQUIFILM TEARS Place 1 drop into both eyes 2 (two) times daily.   traMADol 50 MG tablet Commonly known as: ULTRAM Take 1 tablet (50 mg total) by mouth See admin instructions. 50mg  twice daily scheduled, as well as 50mg  every 6 hours as needed for moderate to severe pain What changed:  when to take this  additional instructions   Vitamin D3 50 MCG (2000 UT) Tabs Take 2,000 Units by mouth daily.   zinc oxide 20 % ointment Apply 1 application topically in the morning, at noon, and at bedtime. To sacrum and buttocks               Discharge Care Instructions  (From admission, onward)           Start     Ordered   03/18/21 0000  Discharge wound care:       Comments: Keep the area clean and dry. Apply 1 application of zinc oxide cream topically in the morning, at noon, and at bedtime to sacrum and buttocks.   03/18/21 1206            DISPOSITION AND FOLLOW-UP:   Jennifer [name] was discharged from [facility] in Stable condition. At the hospital follow up visit please address:  #Multifocal encephalopathy #UTI Patient discharged with an albuterol inhaler for occasional wheezing. Consider prophylactic antibiotics for recurrent UTIs. Follow-up GI panel collected due to diarrhea.   #Moderate hypernatremia Recheck BMP. Continue to encourage PO intake.  #Holosystolic murmur appreciated best at apex Monitor.   Follow-up Recommendations: Consults: None Labs: Basic Metabolic Profile and CBC Studies: None Medications:   START: Albuterol inhaler, every 4 hours as needed for wheezing STOP: Bactrim   Follow-up Appointments:  Follow-up Information     Primary Care Provider Follow up in 1 week(s).   Contact information: Please call and schedule an appointment to be seen by your primary care provider in the next 1-2 weeks.                HOSPITAL COURSE:  Patient Summary: #Multifocal encephalopathy #UTI Patient presented with several days of AMS. On presentation she had hypernatremia to 153. She was hemodynamically stable without leukocytosis and was afebrile. Chest-ray showed no focal opacities but she did have new oxygen requirement to 3 LNC as well as peripheral cyanosis and rales anteriorly. She received ceftriaxone and 50cc LR bolus in the ED and ceftriaxone was continued on admission. Oxybutynin was held. UA was obtained which showed small hemoglobin and 0-5 RBC, small leukocytes, rare bacteria. She did have hypotension on hospital day 1 but responded appropriately to 1.5L IVF. She had several episodes of diarrhea prompting placement of rectal pouch and collection of GI panel which at time of discharge has not yet resulted. Rectal pouch removed 2/2 improvement of diarrhea. Urine culture was positive for Aerococcus species which is covered by ceftriaxone, which she received three days of ending on 03/06.    #Moderate  hypernatremia At admission patient had hypernatremia at 153. She was started on D5 100 cc/h. Sodium gradually improved with improved hydration.  #Holosystolic murmur appreciated best at apex Murmur was appreciated on admission physical exam. Echocardiogram was obtained and showed LV EF 55-60% with normal function, no regional wall motion abnormalities, moderate hypertrophy; RV systolic function normal with normal size and moderately elevated pulmonary artery systolic pressure; mildly dilated LA; trivial mitral valve regurgitation; mild aortic valve regurgitation and stenosis.   #History of dementia, unspecified #Hypoalbuminemia #Failure to thrive TSH was checked and normal. Managed with delirium precautions and feeding supplement.   #Chronic pain secondary to osteoarthritis Chronic condition. Continued on home tylenol, voltaren gel, lidocaine patch daily, tramadol, ibuprofen.   DISCHARGE INSTRUCTIONS:   Discharge Instructions     Call MD for:  difficulty breathing, headache or visual disturbances   Complete by: As directed  Call MD for:  redness, tenderness, or signs of infection (pain, swelling, redness, odor or green/yellow discharge around incision site)   Complete by: As directed    Call MD for:  temperature >100.4   Complete by: As directed    Diet - low sodium heart healthy   Complete by: As directed    Discharge wound care:   Complete by: As directed    Keep the area clean and dry. Apply 1 application of zinc oxide cream topically in the morning, at noon, and at bedtime to sacrum and buttocks.   Increase activity slowly   Complete by: As directed    Increase activity slowly   Complete by: As directed    No wound care   Complete by: As directed        SUBJECTIVE:  Patient evaluated at bedside this morning with her daughter present. She wakes and opens her eyes when prompted and responds to questions. She has pain in her back and knees which, according to her daughter,  is baseline for her. Her daughter notes that her breathing seems improved today.  Discharge Vitals:   BP (!) 148/77 (BP Location: Right Arm)    Pulse 91    Temp 97.9 F (36.6 C) (Oral)    Resp 20    Ht 5\' 6"  (1.676 m)    Wt 60.7 kg    SpO2 100%    BMI 21.60 kg/m   OBJECTIVE:  Constitutional:Chronically ill appearing female. No acute distress. Cardio:Regular rate and rhythm.  Pulm:Mild bilateral wheezing noted on anterior auscultation. Normal work of breathing.  Abdomen:Soft, non-tender, non-distended. WUJ:[id number]:Negative for extremity edema. Skin:Warm and dry.  Neuro:Lethargic but wakes up and raises head during conversation and answers yes/no questions.   Pertinent Labs, Studies, and Procedures:  CBC Latest Ref Rng & Units 03/18/2021 03/17/2021 03/16/2021  WBC 4.0 - 10.5 K/uL 8.0 6.6 7.3  Hemoglobin 12.0 - 15.0 g/dL 29.512.6 62.112.2 11.8(L)  Hematocrit 36.0 - 46.0 % 41.4 39.6 38.0  Platelets 150 - 400 K/uL 196 192 175    CMP Latest Ref Rng & Units 03/18/2021 03/17/2021 03/16/2021  Glucose 70 - 99 mg/dL 308(M101(H) 578(I101(H) 99  BUN 8 - 23 mg/dL 69(G24(H) 20 29(B25(H)  Creatinine 0.44 - 1.00 mg/dL 2.840.69 1.320.64 4.400.85  Sodium 135 - 145 mmol/L 149(H) 145 144  Potassium 3.5 - 5.1 mmol/L 3.9 3.0(L) 3.6  Chloride 98 - 111 mmol/L 114(H) 109 108  CO2 22 - 32 mmol/L 26 26 26   Calcium 8.9 - 10.3 mg/dL 8.2(L) 8.0(L) 8.3(L)  Total Protein 6.5 - 8.1 g/dL - - -  Total Bilirubin 0.3 - 1.2 mg/dL - - -  Alkaline Phos 38 - 126 U/L - - -  AST 15 - 41 U/L - - -  ALT 0 - 44 U/L - - -    DG Chest 2 View  Result Date: 03/14/2021 CLINICAL DATA:  Evaluate for pneumonia EXAM: CHEST - 2 VIEW COMPARISON:  01/10/2021 FINDINGS: Cardiac size is within normal limits. Thoracic aorta is tortuous and ectatic. There is hiatal hernia in the retrocardiac region. Right hemidiaphragm is elevated with possible interposition of colon between diaphragm and liver. Surgical clips are seen in both axillary regions, more so on the right side. There are no signs of  pulmonary edema or new focal infiltrates. IMPRESSION: There are no new infiltrates or signs of pulmonary edema. Electronically Signed   By: Ernie AvenaPalani  Rathinasamy M.D.   On: 03/14/2021 19:35     Signed: Champ MungoEmily Karita Dralle,  D.O.  Internal Medicine Resident, PGY-1 [facility]  Pager: [phone] 12:07 PM, 03/18/2021

## 2021-03-18 NOTE — Evaluation (Signed)
Occupational Therapy Evaluation ?Patient Details ?Name: Jennifer [name] ?MRN: [id number] ?DOB: 12-16-1922 ?Today's Date: 03/18/2021 ? ? ?History of Present Illness In brief, patient is a 88 year old female presented to the ED with hypernatremia on outpatient labs,AMS,multifocal encephalopathy. PHMx: dementia, hypertension, breast cancer status post bilateral mastectomy, recurrent UTIs, COPD, CAD, COVID 3 months ago  ? ?Clinical Impression ?  ?This 88 yo female admitted with above from SNF with PLOF of being a hoyer lift, total A for bathing, dressing, toileting. She could wash her face and self feed/drink once items placed in her right hand (per dtr over phone). Currently she is total A for rolling in bed, she is unable to wash her face or hold a cup to drink. Feel she would benefit from a trial of OT back at SNF to see if she can get back to washing face and self feeding. Defer remainder of OT back to SNF.  ?   ? ?Recommendations for follow up therapy are one component of a multi-disciplinary discharge planning process, led by the attending physician.  Recommendations may be updated based on patient status, additional functional criteria and insurance authorization.  ? ?Follow Up Recommendations ? Skilled nursing-short term rehab (<3 hours/day) (trial)  ?  ?Assistance Recommended at Discharge Frequent or constant Supervision/Assistance  ?Patient can return home with the following Two people to help with walking and/or transfers;Two people to help with bathing/dressing/bathroom;Assistance with feeding ? ?  ?   ?   ?   ?Precautions / Restrictions Precautions ?Precautions: Fall ?Restrictions ?Weight Bearing Restrictions: No  ? ?  ? ?Mobility Bed Mobility ?  ?  ?  ?  ?  ?  ?  ?  ? Total A to roll ? ? ? ?  ?   ? ?ADL either performed or assessed with clinical judgement  ? ?ADL  Pt unable to wash her own face nor hold a cup to drink from (straw) ?  ?  ?  ?  ?  ?  ?  ?  ?  ?  ?  ?  ?  ?  ?  ?  ?  ?  ?  ?General ADL Comments:  total A for all basic ADLs (per dtr on phone pta pt was able to wash her face and hold a cup/spoon to feed self once item placed in her right hand)  ? ? ? ?Vision Baseline Vision/History:  (unknown) ?   ?   ?   ?   ? ?Pertinent Vitals/Pain Pain Assessment ?Pain Assessment: No/denies pain  ? ? ? ?Hand Dominance Right ?  ?Extremity/Trunk Assessment Upper Extremity Assessment ?Upper Extremity Assessment: Defer to OT evaluation ?RUE Deficits / Details: moving spotaneously to try to reach up to mouth to wipe it; can A with moving arm and can hold in air once raised up ?RUE Coordination: decreased gross motor;decreased fine motor ?LUE Deficits / Details: moving spotaneously to try to reach up to mouth to wipe it, not assisting to move it nor can hold in air once placed ?LUE Coordination: decreased gross motor;decreased fine motor ?  ? ?  ?  ?Communication Communication ?Communication: Expressive difficulties;Receptive difficulties;HOH (hears better out of left ear) ?  ?Cognition Arousal/Alertness: Awake/alert ?Behavior During Therapy: Flat affect ?Overall Cognitive Status: History of cognitive impairments - at baseline ?  ?  ?  ?  ?  ?  ?  ?  ?  ?  ?  ?  ?  ?  ?  ?  ?General Comments:  HOH (hears better out of left ear) ?  ?  ?   ?   ?   ? ? ?Home Living Family/patient expects to be discharged to:: Skilled nursing facility ?  ?  ?  ?  ?  ?  ?  ?  ?  ?  ?  ?  ?  ?  ?  ?  ?Additional Comments: From [facility] SNF ?  ? ?  ?Prior Functioning/Environment Prior Level of Function : Needs assist ?  ?  ?  ?  ?  ?  ?Mobility Comments: Per chart pt is mostly bed bound, dependent on staff for transfer with hoyer lift to geri chair. Was getting PT up until about 3 weeks ago for LE strengthening ?ADLs Comments: Per dtr on phone pt dependent for B/D/Toileting. Could wash her face and drink from a cup once item placed in her right hand. ?  ? ?  ?  ?OT Problem List: Decreased range of motion;Decreased strength;Decreased  coordination ?  ?   ?   ?OT Goals(Current goals can be found in the care plan section) Acute Rehab OT Goals ?Patient Stated Goal: unable to state ?OT Goal Formulation: Patient unable to participate in goal setting  ?   ? ?   ?AM-PAC OT "6 Clicks" Daily Activity     ?Outcome Measure Help from another person eating meals?: Total ?Help from another person taking care of personal grooming?: Total ?Help from another person toileting, which includes using toliet, bedpan, or urinal?: Total ?Help from another person bathing (including washing, rinsing, drying)?: Total ?Help from another person to put on and taking off regular upper body clothing?: Total ?Help from another person to put on and taking off regular lower body clothing?: Total ?6 Click Score: 6 ?  ?End of Session   ? ?Activity Tolerance: Patient limited by lethargy ?Patient left: in bed;with call bell/phone within reach;with bed alarm set ? ?OT Visit Diagnosis: Muscle weakness (generalized) (M62.81);Other symptoms and signs involving cognitive function  ?              ?Time: [id number] ?OT Time Calculation (min): 15 min ?Charges:  OT General Charges ?$OT Visit: 1 Visit ?OT Evaluation ?$OT Eval Moderate Complexity: 1 Mod ? ?Golden Circle, OTR/L ?Acute Rehab Services ?Pager [phone] ?Office [phone] ? ? ? ?Almon Register ?03/18/2021, 10:39 AM ?

## 2021-03-18 NOTE — Evaluation (Signed)
Physical Therapy Evaluation ?Patient Details ?Name: Jennifer [name] ?MRN: [id number] ?DOB: 11-Nov-1922 ?Today's Date: 03/18/2021 ? ?History of Present Illness ? In brief, patient is a 88 year old female presented to the ED with hypernatremia on outpatient labs,AMS,multifocal encephalopathy. PHMx: dementia, hypertension, breast cancer status post bilateral mastectomy, recurrent UTIs, COPD, CAD, COVID 3 months ago  ?Clinical Impression ? Pt admitted with above diagnosis. Pt unable to participate much today due to lethargy.  Pt did not follow any commands and did not open eyes much during session.  PEr OT, daughter states pt was getting PT a few weeks ago at facility. REcommend a trial of PT once pt is back to facility.  Ordered Prevalon boots to protect pts heels as well.   Pt currently with functional limitations due to the deficits listed below (see PT Problem List). Pt will benefit from skilled PT to increase their independence and safety with mobility to allow discharge to the venue listed below.       ?   ? ?Recommendations for follow up therapy are one component of a multi-disciplinary discharge planning process, led by the attending physician.  Recommendations may be updated based on patient status, additional functional criteria and insurance authorization. ? ?Follow Up Recommendations Skilled nursing-short term rehab (<3 hours/day) (Trial of PT) ? ?  ?Assistance Recommended at Discharge Frequent or constant Supervision/Assistance  ?Patient can return home with the following ? Two people to help with walking and/or transfers;Two people to help with bathing/dressing/bathroom;Assistance with feeding;Assistance with cooking/housework;Direct supervision/assist for medications management;Direct supervision/assist for financial management;Help with stairs or ramp for entrance ? ?  ?Equipment Recommendations None recommended by PT  ?Recommendations for Other Services ?    ?  ?Functional Status Assessment Patient has had a  recent decline in their functional status and/or demonstrates limited ability to make significant improvements in function in a reasonable and predictable amount of time  ? ?  ?Precautions / Restrictions Precautions ?Precautions: Fall ?Restrictions ?Weight Bearing Restrictions: No  ? ?  ? ?Mobility ? Bed Mobility ?Overal bed mobility: Needs Assistance ?Bed Mobility: Rolling ?Rolling: Total assist ?  ?  ?  ?  ?General bed mobility comments: total A ?  ? ?Transfers ?  ?  ?  ?  ?  ?  ?  ?  ?  ?General transfer comment: hoyer OOB pta ?  ? ?Ambulation/Gait ?  ?  ?  ?  ?  ?  ?  ?  ? ?Stairs ?  ?  ?  ?  ?  ? ?Wheelchair Mobility ?  ? ?Modified Rankin (Stroke Patients Only) ?  ? ?  ? ?Balance   ?  ?  ?  ?  ?  ?  ?  ?  ?  ?  ?  ?  ?  ?  ?  ?  ?  ?  ?   ? ? ? ?Pertinent Vitals/Pain Pain Assessment ?Pain Assessment: No/denies pain ?Breathing: normal ?Negative Vocalization: none ?Facial Expression: smiling or inexpressive ?Body Language: relaxed ?Consolability: no need to console ?PAINAD Score: 0  ? ? ?Home Living Family/patient expects to be discharged to:: Skilled nursing facility ?  ?  ?  ?  ?  ?  ?  ?  ?  ?Additional Comments: From [facility] SNF  ?  ?Prior Function Prior Level of Function : Needs assist ?  ?  ?  ?  ?  ?  ?Mobility Comments: Per chart pt is mostly bed bound, dependent on staff for transfer  with hoyer lift to geri chair. Was getting PT up until about 3 weeks ago for LE strengthening ?ADLs Comments: Per dtr on phone pt dependent for B/D/Toileting. Could wash her face and drink from a cup once item placed in her right hand. ?  ? ? ?Hand Dominance  ? Dominant Hand: Right ? ?  ?Extremity/Trunk Assessment  ? Upper Extremity Assessment ?Upper Extremity Assessment: Defer to OT evaluation ?RUE Deficits / Details: moving spotaneously to try to reach up to mouth to wipe it; can A with moving arm and can hold in air once raised up ?RUE Coordination: decreased gross motor;decreased fine motor ?LUE Deficits /  Details: moving spotaneously to try to reach up to mouth to wipe it, not assisting to move it nor can hold in air once placed ?LUE Coordination: decreased gross motor;decreased fine motor ?  ? ?Lower Extremity Assessment ?Lower Extremity Assessment: RLE deficits/detail;LLE deficits/detail ?RLE Deficits / Details: moved spontaneously but not to command/ ROM WFL ?LLE Deficits / Details: no movement to command/ ROM WFL ?  ? ?   ?Communication  ? Communication: Expressive difficulties;Receptive difficulties;HOH (hears better out of left ear)  ?Cognition Arousal/Alertness: Awake/alert ?Behavior During Therapy: Flat affect ?Overall Cognitive Status: History of cognitive impairments - at baseline ?  ?  ?  ?  ?  ?  ?  ?  ?  ?  ?  ?  ?  ?  ?  ?  ?General Comments: HOH (hears better out of left ear) ?  ?  ? ?  ?General Comments General comments (skin integrity, edema, etc.): 91 bpm, 148/77 ? ?  ?Exercises General Exercises - Lower Extremity ?Ankle Circles/Pumps: PROM, Both, 5 reps, Supine ?Heel Slides: PROM, Both, 5 reps, Supine ?Hip ABduction/ADduction: PROM, Both, 5 reps, Supine ?Straight Leg Raises: PROM, Both, 5 reps, Supine  ? ?Assessment/Plan  ?  ?PT Assessment Patient needs continued PT services  ?PT Problem List Decreased activity tolerance;Decreased balance;Decreased mobility;Decreased strength;Decreased range of motion;Decreased knowledge of use of DME;Decreased safety awareness;Decreased knowledge of precautions ? ?   ?  ?PT Treatment Interventions DME instruction;Functional mobility training;Therapeutic activities;Therapeutic exercise;Balance training;Patient/family education   ? ?PT Goals (Current goals can be found in the Care Plan section)  ?Acute Rehab PT Goals ?Patient Stated Goal: unable to state ?PT Goal Formulation: Patient unable to participate in goal setting ?Time For Goal Achievement: 04/01/21 ?Potential to Achieve Goals: Fair ? ?  ?Frequency Min 2X/week ?  ? ? ?Co-evaluation   ?  ?  ?  ?  ? ? ?   ?AM-PAC PT "6 Clicks" Mobility  ?Outcome Measure Help needed turning from your back to your side while in a flat bed without using bedrails?: Total ?Help needed moving from lying on your back to sitting on the side of a flat bed without using bedrails?: Total ?Help needed moving to and from a bed to a chair (including a wheelchair)?: Total ?Help needed standing up from a chair using your arms (e.g., wheelchair or bedside chair)?: Total ?Help needed to walk in hospital room?: Total ?Help needed climbing 3-5 steps with a railing? : Total ?6 Click Score: 6 ? ?  ?End of Session   ?Activity Tolerance: Patient limited by fatigue;Patient limited by lethargy ?Patient left: in bed;with call bell/phone within reach;with bed alarm set ?Nurse Communication: Mobility status;Need for lift equipment ?PT Visit Diagnosis: Muscle weakness (generalized) (M62.81) ?  ? ?Time: 6812-7517 ?PT Time Calculation (min) (ACUTE ONLY): 15 min ? ? ?Charges:   PT Evaluation ?$  PT Eval Low Complexity: 1 Low ?  ?  ?   ? ? ?Kattleya Kuhnert M,PT ?Acute Rehab Services ?[phone] ?[phone] (pager)  ? ?[name] ?03/18/2021, 9:33 AM ? ?

## 2021-04-14 ENCOUNTER — Emergency Department (HOSPITAL_COMMUNITY): Payer: Medicare Other

## 2021-04-14 ENCOUNTER — Other Ambulatory Visit: Payer: Self-pay

## 2021-04-14 ENCOUNTER — Inpatient Hospital Stay (HOSPITAL_COMMUNITY)
Admission: EM | Admit: 2021-04-14 | Discharge: 2021-04-16 | DRG: 640 | Disposition: A | Discharge status: Skilled Nursing Facility | Payer: Medicare Other | Source: Skilled Nursing Facility | Attending: Internal Medicine | Admitting: Internal Medicine

## 2021-04-14 ENCOUNTER — Encounter (HOSPITAL_COMMUNITY): Payer: Self-pay

## 2021-04-14 DIAGNOSIS — Z79899 Other long term (current) drug therapy: Secondary | ICD-10-CM

## 2021-04-14 DIAGNOSIS — R1310 Dysphagia, unspecified: Secondary | ICD-10-CM

## 2021-04-14 DIAGNOSIS — E860 Dehydration: Secondary | ICD-10-CM | POA: Diagnosis present

## 2021-04-14 DIAGNOSIS — Z79890 Hormone replacement therapy (postmenopausal): Secondary | ICD-10-CM

## 2021-04-14 DIAGNOSIS — F0390 Unspecified dementia without behavioral disturbance: Secondary | ICD-10-CM | POA: Diagnosis present

## 2021-04-14 DIAGNOSIS — E871 Hypo-osmolality and hyponatremia: Secondary | ICD-10-CM | POA: Diagnosis present

## 2021-04-14 DIAGNOSIS — E785 Hyperlipidemia, unspecified: Secondary | ICD-10-CM | POA: Diagnosis present

## 2021-04-14 DIAGNOSIS — R918 Other nonspecific abnormal finding of lung field: Secondary | ICD-10-CM | POA: Diagnosis present

## 2021-04-14 DIAGNOSIS — Z87891 Personal history of nicotine dependence: Secondary | ICD-10-CM

## 2021-04-14 DIAGNOSIS — E873 Alkalosis: Secondary | ICD-10-CM | POA: Diagnosis present

## 2021-04-14 DIAGNOSIS — D539 Nutritional anemia, unspecified: Secondary | ICD-10-CM | POA: Diagnosis present

## 2021-04-14 DIAGNOSIS — Z9013 Acquired absence of bilateral breasts and nipples: Secondary | ICD-10-CM

## 2021-04-14 DIAGNOSIS — E870 Hyperosmolality and hypernatremia: Secondary | ICD-10-CM | POA: Diagnosis not present

## 2021-04-14 DIAGNOSIS — G9341 Metabolic encephalopathy: Secondary | ICD-10-CM | POA: Diagnosis present

## 2021-04-14 DIAGNOSIS — M549 Dorsalgia, unspecified: Secondary | ICD-10-CM | POA: Diagnosis present

## 2021-04-14 DIAGNOSIS — I10 Essential (primary) hypertension: Secondary | ICD-10-CM | POA: Diagnosis present

## 2021-04-14 DIAGNOSIS — J449 Chronic obstructive pulmonary disease, unspecified: Secondary | ICD-10-CM | POA: Diagnosis present

## 2021-04-14 DIAGNOSIS — Z87440 Personal history of urinary (tract) infections: Secondary | ICD-10-CM

## 2021-04-14 DIAGNOSIS — I2510 Atherosclerotic heart disease of native coronary artery without angina pectoris: Secondary | ICD-10-CM | POA: Diagnosis present

## 2021-04-14 DIAGNOSIS — R4182 Altered mental status, unspecified: Secondary | ICD-10-CM | POA: Diagnosis present

## 2021-04-14 DIAGNOSIS — R001 Bradycardia, unspecified: Secondary | ICD-10-CM | POA: Diagnosis present

## 2021-04-14 DIAGNOSIS — K219 Gastro-esophageal reflux disease without esophagitis: Secondary | ICD-10-CM | POA: Diagnosis present

## 2021-04-14 DIAGNOSIS — Z8619 Personal history of other infectious and parasitic diseases: Secondary | ICD-10-CM

## 2021-04-14 DIAGNOSIS — M1990 Unspecified osteoarthritis, unspecified site: Secondary | ICD-10-CM | POA: Diagnosis present

## 2021-04-14 DIAGNOSIS — G8929 Other chronic pain: Secondary | ICD-10-CM | POA: Diagnosis present

## 2021-04-14 DIAGNOSIS — Z66 Do not resuscitate: Secondary | ICD-10-CM | POA: Diagnosis present

## 2021-04-14 DIAGNOSIS — E876 Hypokalemia: Secondary | ICD-10-CM | POA: Diagnosis present

## 2021-04-14 DIAGNOSIS — Z853 Personal history of malignant neoplasm of breast: Secondary | ICD-10-CM

## 2021-04-14 LAB — BASIC METABOLIC PANEL
Anion gap: 4 — ABNORMAL LOW (ref 5–15)
BUN: 24 mg/dL — ABNORMAL HIGH (ref 8–23)
CO2: 38 mmol/L — ABNORMAL HIGH (ref 22–32)
Calcium: 8 mg/dL — ABNORMAL LOW (ref 8.9–10.3)
Chloride: 112 mmol/L — ABNORMAL HIGH (ref 98–111)
Creatinine, Ser: 0.77 mg/dL (ref 0.44–1.00)
GFR, Estimated: >60 mL/min (ref >60)
Glucose, Bld: 142 mg/dL — ABNORMAL HIGH (ref 70–99)
Potassium: 3.4 mmol/L — ABNORMAL LOW (ref 3.5–5.1)
Sodium: 154 mmol/L — ABNORMAL HIGH (ref 135–145)

## 2021-04-14 LAB — URINALYSIS, ROUTINE W REFLEX MICROSCOPIC
Bacteria, UA: NONE SEEN
Bilirubin Urine: NEGATIVE
Glucose, UA: NEGATIVE mg/dL
Hgb urine dipstick: NEGATIVE
Ketones, ur: NEGATIVE mg/dL
Leukocytes,Ua: NEGATIVE
Nitrite: NEGATIVE
Protein, ur: 30 mg/dL — AB
Specific Gravity, Urine: 1.019 (ref 1.005–1.030)
pH: 5 (ref 5.0–8.0)

## 2021-04-14 LAB — COMPREHENSIVE METABOLIC PANEL
ALT: 11 U/L (ref 0–44)
AST: 17 U/L (ref 15–41)
Albumin: 2 g/dL — ABNORMAL LOW (ref 3.5–5.0)
Alkaline Phosphatase: 108 U/L (ref 38–126)
Anion gap: 3 — ABNORMAL LOW (ref 5–15)
BUN: 24 mg/dL — ABNORMAL HIGH (ref 8–23)
CO2: 39 mmol/L — ABNORMAL HIGH (ref 22–32)
Calcium: 8.4 mg/dL — ABNORMAL LOW (ref 8.9–10.3)
Chloride: 113 mmol/L — ABNORMAL HIGH (ref 98–111)
Creatinine, Ser: 0.77 mg/dL (ref 0.44–1.00)
GFR, Estimated: >60 mL/min (ref >60)
Glucose, Bld: 117 mg/dL — ABNORMAL HIGH (ref 70–99)
Potassium: 3.4 mmol/L — ABNORMAL LOW (ref 3.5–5.1)
Sodium: 155 mmol/L — ABNORMAL HIGH (ref 135–145)
Total Bilirubin: 0.3 mg/dL (ref 0.3–1.2)
Total Protein: 6.1 g/dL — ABNORMAL LOW (ref 6.5–8.1)

## 2021-04-14 LAB — CBC WITH DIFFERENTIAL/PLATELET
Abs Immature Granulocytes: 0.08 10*3/uL — ABNORMAL HIGH (ref 0.00–0.07)
Basophils Absolute: 0 10*3/uL (ref 0.0–0.1)
Basophils Relative: 0 %
Eosinophils Absolute: 0.2 10*3/uL (ref 0.0–0.5)
Eosinophils Relative: 4 %
HCT: 34.7 % — ABNORMAL LOW (ref 36.0–46.0)
Hemoglobin: 10 g/dL — ABNORMAL LOW (ref 12.0–15.0)
Immature Granulocytes: 2 %
Lymphocytes Relative: 22 %
Lymphs Abs: 1.2 10*3/uL (ref 0.7–4.0)
MCH: 29.5 pg (ref 26.0–34.0)
MCHC: 28.8 g/dL — ABNORMAL LOW (ref 30.0–36.0)
MCV: 102.4 fL — ABNORMAL HIGH (ref 80.0–100.0)
Monocytes Absolute: 0.5 10*3/uL (ref 0.1–1.0)
Monocytes Relative: 10 %
Neutro Abs: 3.3 10*3/uL (ref 1.7–7.7)
Neutrophils Relative %: 62 %
Platelets: 238 10*3/uL (ref 150–400)
RBC: 3.39 MIL/uL — ABNORMAL LOW (ref 3.87–5.11)
RDW: 19.7 % — ABNORMAL HIGH (ref 11.5–15.5)
WBC: 5.3 10*3/uL (ref 4.0–10.5)
nRBC: 0.6 % — ABNORMAL HIGH (ref 0.0–0.2)

## 2021-04-14 LAB — PHOSPHORUS
Phosphorus: 3.4 mg/dL (ref 2.5–4.6)
Phosphorus: 3.8 mg/dL (ref 2.5–4.6)

## 2021-04-14 LAB — MAGNESIUM
Magnesium: 2.3 mg/dL (ref 1.7–2.4)
Magnesium: 2.1 mg/dL (ref 1.7–2.4)

## 2021-04-14 LAB — C-REACTIVE PROTEIN: CRP: 3.8 mg/dL — ABNORMAL HIGH (ref <1.0)

## 2021-04-14 MED ORDER — ACETAMINOPHEN 650 MG RE SUPP: 650.0000 mg | Freq: Four times a day (QID) | RECTAL | Status: DC | PRN

## 2021-04-14 MED ORDER — POTASSIUM CHLORIDE 10 MEQ/100ML IV SOLN
10.0000 meq | INTRAVENOUS | Status: AC
  Administered 2021-04-14 – 2021-04-15 (×3): 10 meq via INTRAVENOUS
  Filled 2021-04-14 (×3): qty 100

## 2021-04-14 MED ORDER — ACETAMINOPHEN 325 MG PO TABS
650.0000 mg | ORAL_TABLET | Freq: Four times a day (QID) | ORAL | Status: DC | PRN
  Administered 2021-04-15 – 2021-04-16 (×4): 650 mg via ORAL
  Filled 2021-04-14 (×4): qty 2

## 2021-04-14 MED ORDER — HEPARIN SODIUM (PORCINE) 5000 UNIT/ML IJ SOLN
5000.0000 [IU] | Freq: Three times a day (TID) | INTRAMUSCULAR | Status: DC
  Administered 2021-04-14 – 2021-04-16 (×7): 5000 [IU] via SUBCUTANEOUS
  Filled 2021-04-14 (×7): qty 1

## 2021-04-14 MED ORDER — DEXTROSE 5 % IV SOLN: INTRAVENOUS | Status: DC

## 2021-04-14 NOTE — ED Notes (Signed)
Pt care taken, no complaints at this time. 

## 2021-04-14 NOTE — ED Triage Notes (Signed)
Presents by squad for Na of [address] ?

## 2021-04-14 NOTE — H&P (Signed)
? ? ? ?Date: 04/15/2021     ?     ?     ?Patient Name:  Jennifer BeetsVelma M [name] MRN: [id number]  ?DOB: 1922/12/27 Age / Sex: 88 y.o., female   ?PCP: Pcp, No    ?     ?Medical Service: Internal Medicine Teaching Service    ?     ?Attending Physician: Dr. Dickie LaLau, Grace, MD    ?First Contact: Dr. Austin MilesJinwala Pager: [phone]  ?Second Contact: Dr. Burnice LoganGawaluck Pager: [phone]  ?     ?After Hours (After 5p/  First Contact Pager: [phone]  ?weekends / holidays): Second Contact Pager: [phone]  ? ?Chief Complaint: Altered mental status ? ?History of Present Illness: Jennifer [name] is a 88 year old female with PMHx unspecified dementia since 2016, HTN, hx breast cancer s/p bilateral mastectomy, recurrent UTIs with multidrug resistant ESBL E. coli. 2020,  COPD presenting with altered mental status off baseline per family. Last known normal March 26th, 2023.  Patient was somnolent during exam, responded to painful stimuli only and unable to provide history. Spoke with daughter, Laruth BouchardGweyndolyn Ware who provided collateral information. ? ?At baseline, patient has unspecified dementia in which most days she has normal conversation with family members and speaks in short phrases on occasion.  At worst, she would be confused and have poor oral intake.  She is also hard of hearing, but hears best in the left ear.  No vision impairment.  She is also noted to sleep most during the day.  Patient has history of dysphagia, however, able to tolerate soft foods and protein drinks such as Ensure.  She is noted to have weight loss, lost 8 pounds in the past month.  Otherwise, at best patient is able to eat 3 times a day, most days eats "very little".  Patient is no longer ambulatory and is wheelchair-bound.  No recent falls or trauma. ? ?Patient was recently hospitalized for hypernatremia secondary to dehydration.  During that hospitalization she was also treated for UTI. Patient known to have diarrhea for the past 6 months at least 1 bout daily.  Thought to be due  to soft diet. However, GI panel was obtained on most recent hospitalization, negative for pathology. ? ? ?Meds:  ?No current facility-administered medications on file prior to encounter.  ? ?Current Outpatient Medications on File Prior to Encounter  ?Medication Sig Dispense Refill  ? acetaminophen (TYLENOL) 325 MG tablet Take 650 mg by mouth 3 (three) times daily.    ? albuterol (PROVENTIL) (2.5 MG/3ML) 0.083% nebulizer solution Take 3 mLs (2.5 mg total) by nebulization every 4 (four) hours as needed for wheezing or shortness of breath. 75 mL 12  ? Amino Acids-Protein Hydrolys (FEEDING SUPPLEMENT, PRO-STAT SUGAR FREE 64,) LIQD Take 30 mLs by mouth in the morning and at bedtime.    ? carbamide peroxide (DEBROX) 6.5 % OTIC solution Place 10 drops into both ears See admin instructions. Twice a day on the 4th, 5th, 6th,7th of every 3rd month    ? Cholecalciferol (VITAMIN D3) 50 MCG (2000 UT) TABS Take 2,000 Units by mouth daily.    ? Cranberry 500 MG TABS Take 500 mg by mouth daily.    ? Dextrose-Sodium Chloride (DEXTROSE 5 % AND 0.9% NACL) 5-0.9 % infusion Inject 50 mL/hr into the vein continuous. 50 ml/hr x 1 L every shift    ? diclofenac sodium (VOLTAREN) 1 % GEL Apply to both knees QID PRN (Patient taking differently: Apply 4 g topically 4 (four)  times daily. Apply to both knees)    ? Estradiol (IMVEXXY MAINTENANCE PACK) 4 MCG INST Place 4 mcg vaginally 2 (two) times a week. Monday's and Thursday's    ? geriatric multivitamins-minerals (ELDERTONIC/GEVRABON) LIQD Take 15 mLs by mouth daily.    ? lidocaine (LIDODERM) 5 % Place 1 patch onto the skin daily. Remove & Discard patch within 12 hours or as directed by MD (Patient taking differently: Place 1 patch onto the skin daily. Apply to lower back. Remove & Discard patch within 12 hours or as directed by MD) 30 patch 5  ? Olopatadine HCl 0.2 % SOLN Place 1 drop into both eyes daily.    ? OVER THE COUNTER MEDICATION Take 1 Container by mouth every evening. Magic Cup     ? oxybutynin (DITROPAN-XL) 5 MG 24 hr tablet Take 5 mg by mouth at bedtime.    ? polyvinyl alcohol (LIQUIFILM TEARS) 1.4 % ophthalmic solution Place 1 drop into both eyes 2 (two) times daily.    ? Probiotic Product (FORTIFY PROBIOTIC WOMENS PO) Take 1 capsule by mouth daily.    ? traMADol (ULTRAM) 50 MG tablet Take 1 tablet (50 mg total) by mouth See admin instructions. 50mg  twice daily scheduled, as well as 50mg  every 6 hours as needed for moderate to severe pain (Patient taking differently: Take 50 mg by mouth 2 (two) times daily. Up to 4 times as needed.) 15 tablet 0  ? zinc oxide 20 % ointment Apply 1 application topically in the morning, at noon, and at bedtime. To sacrum and buttocks    ? ? ?Allergies: ?Allergies as of 04/14/2021 - Review Complete 04/14/2021  ?Allergen Reaction Noted  ? Aricept [donepezil hcl] Other (See Comments) 11/14/2017  ? Iohexol  09/24/2003  ? Iron Other (See Comments)   ? Penicillins    ? ?Past Medical History:  ?Diagnosis Date  ? Anemia   ? iron deficiency post op.  ? CAD (coronary artery disease) 2008  ? Cath 40% mid LAD, 35% RCA  ? Cellulitis of finger   ? right   ? Chest pain 2006  ? Chronic back pain   ? COPD (chronic obstructive pulmonary disease) (HCC)   ? Gastroesophageal reflux disease   ? Hiatal hernia   ? Hyperlipidemia   ? Memory deficit   ? Nicotine addiction   ? Osteoarthritis   ? status post left TKA; surgery on the right is anticipated in the near future   ? Pulmonary nodules   ? stable since 2006  ? Shingles   ? right breast   ? Skin infection   ? Ulcer   ? gential  ? Use of cane as ambulatory aid   ? ? ?Family History: Mother and father both deceased.  Family history of cancer. ? ?Social History: N/A ? ?Review of Systems: ?A complete ROS was negative except as per HPI.  ? ?Physical Exam: ?Blood pressure (!) 115/50, pulse 67, temperature 98.9 ?F (37.2 ?C), temperature source Oral, resp. rate 18, height 5\' 6"  (1.676 m), weight 60.7 kg, SpO2 100 %. ?Physical  Exam ?Constitutional:   ?   Interventions: Face mask in place.  ?   Comments: Difficult to arouse; withdraw to painful stimuli  ?HENT:  ?   Head: Normocephalic and atraumatic.  ?   Mouth/Throat:  ?   Mouth: Mucous membranes are dry.  ?Cardiovascular:  ?   Rate and Rhythm: Normal rate.  ?   Pulses:     ?  Radial pulses are 2+ on the right side and 2+ on the left side.  ?   Heart sounds: Murmur heard.  ?Systolic murmur is present.  ?Pulmonary:  ?   Effort: Pulmonary effort is normal.  ?   Breath sounds: No wheezing, rhonchi or rales.  ?Chest:  ?   Comments: Bilateral mastectomy ?Abdominal:  ?   Palpations: Abdomen is soft.  ?   Tenderness: There is no abdominal tenderness.  ?Musculoskeletal:  ?   Right lower leg: No edema.  ?   Left lower leg: No edema.  ?Skin: ?   General: Skin is warm and dry.  ?Neurological:  ?   Mental Status: She is unresponsive.  ?   Comments: Withdraw to painful stimuli; does not follow commands; somnolent.   ? ? ? ?EKG: personally reviewed my interpretation is N/A ? ?CXR: personally reviewed my interpretation is There is subtle increase in interstitial markings in both lungs which may be due to poor inspiration or suggest interstitial pneumonia. There is no focal pulmonary consolidation. There is no significant pleural effusion or pneumothorax. Moderate sized fixed hiatal hernia. Elevation of right hemidiaphragm has not changed significantly. ? ?Assessment & Plan by Problem: ?Principal Problem: ?  Dementia (HCC) ?Active Problems: ?  Altered mental status ?  Hypernatremia ?  Dysphagia ? ?#Altered mental status ?#Dementia ?According to patient's daughter, patient has dementia (unspecified type) and at baseline usually has normal conversation and speaks in short phrases.  Sleeps mostly during the day. Last known normal April 06, 2021.  Patient noted to have decline in mental status, increased confusion. Somnolence, sleeping more than baseline and appeared warm/flushed.  On exam, patient  responded to painful stimuli only.  No recent changes in medications.  Patient does however take Tylenol for osteoarthritis.  Will obtain acetaminophen level to rule out toxic insults.  UA is negative for infection.  H

## 2021-04-14 NOTE — ED Provider Notes (Signed)
?[facility] EMERGENCY DEPARTMENT ?Provider Note ? ? ?CSN: [id number] ?Arrival date & time: 04/14/21  1457 ? ?  ? ?History ? ?Chief Complaint  ?Patient presents with  ? Abnormal Lab  ?  Present via EMS from nursing home for a high Na level of 155  ? ? ?Jennifer [name] is a 88 y.o. female w/ PMHx (per chart review) unspecified dementia since 2016, HTN, hx breast cancer s/p bilateral mastectomy, recurrent UTIs with multidrug resistant ESBL E. coli. 2020,  COPD presenting due to hyponatremia noted on labs taken on the 30th.  Due to recent congestion, they performed a chest x-ray as they were concerned she may have a pneumonia. Resides at [facility] skilled nursing facility.  Family initially silage did not you be transported and most recently written go home with hospice care and family then decided they wanted her to be transported and treated at the hospital ? ?Patient has a DNR paperwork with EMS. ? ?HPI ? ?  ? ?Home Medications ?Prior to Admission medications   ?Medication Sig Start Date End Date Taking? Authorizing Provider  ?acetaminophen (TYLENOL) 325 MG tablet Take 650 mg by mouth 3 (three) times daily.    [provider]  ?albuterol (PROVENTIL) (2.5 MG/3ML) 0.083% nebulizer solution Take 3 mLs (2.5 mg total) by nebulization every 4 (four) hours as needed for wheezing or shortness of breath. 03/18/21   Steffanie Rainwater, MD  ?Amino Acids-Protein Hydrolys (FEEDING SUPPLEMENT, PRO-STAT SUGAR FREE 64,) LIQD Take 30 mLs by mouth in the morning and at bedtime.    [provider]  ?carbamide peroxide (DEBROX) 6.5 % OTIC solution Place 10 drops into both ears See admin instructions. Twice a day on the 4th, 5th, 6th,7th of every 3rd month    [provider]  ?Cholecalciferol (VITAMIN D3) 50 MCG (2000 UT) TABS Take 2,000 Units by mouth daily.    [provider]  ?Cranberry 500 MG TABS Take 500 mg by mouth daily.    [provider]  ?Dextrose-Sodium  Chloride (DEXTROSE 5 % AND 0.9% NACL) 5-0.9 % infusion Inject 50 mL/hr into the vein continuous. 50 ml/hr x 1 L every shift    [provider]  ?diclofenac sodium (VOLTAREN) 1 % GEL Apply to both knees QID PRN ?Patient taking differently: Apply 4 g topically 4 (four) times daily. Apply to both knees 04/29/18   Vassie Loll, MD  ?Estradiol [facility] MAINTENANCE PACK) 4 MCG INST Place 4 mcg vaginally 2 (two) times a week. Monday's and Thursday's    [provider]  ?geriatric multivitamins-minerals (ELDERTONIC/GEVRABON) LIQD Take 15 mLs by mouth daily.    [provider]  ?lidocaine (LIDODERM) 5 % Place 1 patch onto the skin daily. Remove & Discard patch within 12 hours or as directed by MD ?Patient taking differently: Place 1 patch onto the skin daily. Apply to lower back. Remove & Discard patch within 12 hours or as directed by MD 03/02/18   [name], MD  ?Olopatadine HCl 0.2 % SOLN Place 1 drop into both eyes daily.    [provider]  ?OVER THE COUNTER MEDICATION Take 1 Container by mouth every evening. Magic Cup    [provider]  ?oxybutynin (DITROPAN-XL) 5 MG 24 hr tablet Take 5 mg by mouth at bedtime.    [provider]  ?polyvinyl alcohol (LIQUIFILM TEARS) 1.4 % ophthalmic solution Place 1 drop into both eyes 2 (two) times daily.    [provider]  ?  Probiotic Product (FORTIFY PROBIOTIC WOMENS PO) Take 1 capsule by mouth daily.    [provider]  ?traMADol (ULTRAM) 50 MG tablet Take 1 tablet (50 mg total) by mouth See admin instructions. 50mg  twice daily scheduled, as well as 50mg  every 6 hours as needed for moderate to severe pain ?Patient taking differently: Take 50 mg by mouth 2 (two) times daily. Up to 4 times as needed. 11/26/20   Ghimire, Werner LeanShanker M, MD  ?zinc oxide 20 % ointment Apply 1 application topically in the morning, at noon, and at bedtime. To sacrum and buttocks    [provider]  ?   ? ?Allergies     ?Aricept [donepezil hcl], Iohexol, Iron, and Penicillins   ? ?Review of Systems   ?Review of Systems  ?Unable to perform ROS: Dementia  ? ?Physical Exam ?Updated Vital Signs ?BP (!) 117/43   Pulse 64   Temp 98.9 ?F (37.2 ?C) (Oral)   Resp 18   Ht 5\' 6"  (1.676 m)   Wt 60.7 kg   SpO2 100%   BMI 21.60 kg/m?  ?Physical Exam ?Vitals and nursing note reviewed.  ?Cardiovascular:  ?   Rate and Rhythm: Normal rate and regular rhythm.  ?Pulmonary:  ?   Effort: Pulmonary effort is normal. No respiratory distress.  ?Abdominal:  ?   General: There is no distension.  ?   Tenderness: There is no abdominal tenderness. There is no guarding or rebound.  ?Neurological:  ?   Mental Status: She is alert.  ? ? ?ED Results / Procedures / Treatments   ?Labs ?(all labs ordered are listed, but only abnormal results are displayed) ?Labs Reviewed  ?CBC WITH DIFFERENTIAL/PLATELET - Abnormal; Notable for the following components:  ?    Result Value  ? RBC 3.39 (*)   ? Hemoglobin 10.0 (*)   ? HCT 34.7 (*)   ? MCV 102.4 (*)   ? MCHC 28.8 (*)   ? RDW 19.7 (*)   ? nRBC 0.6 (*)   ? Abs Immature Granulocytes 0.08 (*)   ? All other components within normal limits  ?COMPREHENSIVE METABOLIC PANEL - Abnormal; Notable for the following components:  ? Sodium 155 (*)   ? Potassium 3.4 (*)   ? Chloride 113 (*)   ? CO2 39 (*)   ? Glucose, Bld 117 (*)   ? BUN 24 (*)   ? Calcium 8.4 (*)   ? Total Protein 6.1 (*)   ? Albumin 2.0 (*)   ? Anion gap 3 (*)   ? All other components within normal limits  ?URINALYSIS, ROUTINE W REFLEX MICROSCOPIC - Abnormal; Notable for the following components:  ? APPearance HAZY (*)   ? Protein, ur 30 (*)   ? All other components within normal limits  ?BASIC METABOLIC PANEL - Abnormal; Notable for the following components:  ? Sodium 154 (*)   ? Potassium 3.4 (*)   ? Chloride 112 (*)   ? CO2 38 (*)   ? Glucose, Bld 142 (*)   ? BUN 24 (*)   ? Calcium 8.0 (*)   ? Anion gap 4 (*)   ? All other components within normal limits   ?C-REACTIVE PROTEIN - Abnormal; Notable for the following components:  ? CRP 3.8 (*)   ? All other components within normal limits  ?URINE CULTURE  ?MAGNESIUM  ?PHOSPHORUS  ?MAGNESIUM  ?PHOSPHORUS  ?BASIC METABOLIC PANEL  ?CBC  ?ACETAMINOPHEN LEVEL  ?SALICYLATE LEVEL  ? ? ?EKG ?None ? ?  Radiology ?DG Chest Portable 1 View ? ?Result Date: 04/14/2021 ?CLINICAL DATA:  Chest congestion EXAM: PORTABLE CHEST 1 VIEW COMPARISON:  Previous studies including the examination done on 03/14/2021 FINDINGS: Transverse diameter of heart is within normal limits. Thoracic aorta is tortuous and ectatic. There are no signs of alveolar pulmonary edema. There is subtle increase in interstitial markings in both lungs. There is no focal consolidation. There is moderate sized fixed hiatal hernia. Right hemidiaphragm is elevated. Colon is interposed between liver and right hemidiaphragm. Surgical clips are seen in the right axilla and right chest wall. Few surgical clips are seen in the left axilla. There is no pleural effusion or pneumothorax. IMPRESSION: There is subtle increase in interstitial markings in both lungs which may be due to poor inspiration or suggest interstitial pneumonia. There is no focal pulmonary consolidation. There is no significant pleural effusion or pneumothorax. Moderate sized fixed hiatal hernia. Elevation of right hemidiaphragm has not changed significantly. Electronically Signed   By: Ernie Avena M.D.   On: 04/14/2021 15:52   ? ?Procedures ?Procedures  ? ? ?Medications Ordered in ED ?Medications  ?dextrose 5 % solution ( Intravenous New Bag/Given 04/14/21 1943)  ?heparin injection 5,000 Units (5,000 Units Subcutaneous Given 04/14/21 2337)  ?acetaminophen (TYLENOL) tablet 650 mg (has no administration in time range)  ?  Or  ?acetaminophen (TYLENOL) suppository 650 mg (has no administration in time range)  ?potassium chloride 10 mEq in 100 mL IVPB (10 mEq Intravenous New Bag/Given 04/14/21 2337)  ?albuterol  (PROVENTIL) (2.5 MG/3ML) 0.083% nebulizer solution 2.5 mg (has no administration in time range)  ? ? ?ED Course/ Medical Decision Making/ A&P ?Clinical Course as of 04/15/21 0026  ?Mon Apr 14, 2021  ?1613 EMS reports pt at he

## 2021-04-15 ENCOUNTER — Other Ambulatory Visit: Payer: Self-pay

## 2021-04-15 DIAGNOSIS — R1310 Dysphagia, unspecified: Secondary | ICD-10-CM

## 2021-04-15 DIAGNOSIS — I2510 Atherosclerotic heart disease of native coronary artery without angina pectoris: Secondary | ICD-10-CM | POA: Diagnosis present

## 2021-04-15 DIAGNOSIS — Z87891 Personal history of nicotine dependence: Secondary | ICD-10-CM | POA: Diagnosis not present

## 2021-04-15 DIAGNOSIS — K219 Gastro-esophageal reflux disease without esophagitis: Secondary | ICD-10-CM | POA: Diagnosis present

## 2021-04-15 DIAGNOSIS — J449 Chronic obstructive pulmonary disease, unspecified: Secondary | ICD-10-CM | POA: Diagnosis present

## 2021-04-15 DIAGNOSIS — I10 Essential (primary) hypertension: Secondary | ICD-10-CM | POA: Diagnosis present

## 2021-04-15 DIAGNOSIS — R001 Bradycardia, unspecified: Secondary | ICD-10-CM | POA: Diagnosis present

## 2021-04-15 DIAGNOSIS — Z87440 Personal history of urinary (tract) infections: Secondary | ICD-10-CM | POA: Diagnosis not present

## 2021-04-15 DIAGNOSIS — Z8619 Personal history of other infectious and parasitic diseases: Secondary | ICD-10-CM | POA: Diagnosis not present

## 2021-04-15 DIAGNOSIS — R4182 Altered mental status, unspecified: Secondary | ICD-10-CM | POA: Diagnosis present

## 2021-04-15 DIAGNOSIS — E876 Hypokalemia: Secondary | ICD-10-CM | POA: Diagnosis present

## 2021-04-15 DIAGNOSIS — Z9013 Acquired absence of bilateral breasts and nipples: Secondary | ICD-10-CM | POA: Diagnosis not present

## 2021-04-15 DIAGNOSIS — Z853 Personal history of malignant neoplasm of breast: Secondary | ICD-10-CM | POA: Diagnosis not present

## 2021-04-15 DIAGNOSIS — G9341 Metabolic encephalopathy: Secondary | ICD-10-CM | POA: Diagnosis present

## 2021-04-15 DIAGNOSIS — E870 Hyperosmolality and hypernatremia: Principal | ICD-10-CM

## 2021-04-15 DIAGNOSIS — E785 Hyperlipidemia, unspecified: Secondary | ICD-10-CM | POA: Diagnosis present

## 2021-04-15 DIAGNOSIS — M1990 Unspecified osteoarthritis, unspecified site: Secondary | ICD-10-CM | POA: Diagnosis present

## 2021-04-15 DIAGNOSIS — D539 Nutritional anemia, unspecified: Secondary | ICD-10-CM | POA: Diagnosis present

## 2021-04-15 DIAGNOSIS — Z66 Do not resuscitate: Secondary | ICD-10-CM | POA: Diagnosis present

## 2021-04-15 DIAGNOSIS — M549 Dorsalgia, unspecified: Secondary | ICD-10-CM | POA: Diagnosis present

## 2021-04-15 DIAGNOSIS — G8929 Other chronic pain: Secondary | ICD-10-CM | POA: Diagnosis present

## 2021-04-15 DIAGNOSIS — E860 Dehydration: Secondary | ICD-10-CM | POA: Diagnosis present

## 2021-04-15 DIAGNOSIS — F0390 Unspecified dementia without behavioral disturbance: Secondary | ICD-10-CM | POA: Diagnosis present

## 2021-04-15 DIAGNOSIS — E873 Alkalosis: Secondary | ICD-10-CM | POA: Diagnosis present

## 2021-04-15 DIAGNOSIS — E871 Hypo-osmolality and hyponatremia: Secondary | ICD-10-CM | POA: Diagnosis present

## 2021-04-15 LAB — CBC
HCT: 35.1 % — ABNORMAL LOW (ref 36.0–46.0)
HCT: 35.5 % — ABNORMAL LOW (ref 36.0–46.0)
Hemoglobin: 9.7 g/dL — ABNORMAL LOW (ref 12.0–15.0)
Hemoglobin: 10.3 g/dL — ABNORMAL LOW (ref 12.0–15.0)
MCH: 28.5 pg (ref 26.0–34.0)
MCH: 29.5 pg (ref 26.0–34.0)
MCHC: 27.6 g/dL — ABNORMAL LOW (ref 30.0–36.0)
MCHC: 29 g/dL — ABNORMAL LOW (ref 30.0–36.0)
MCV: 103.2 fL — ABNORMAL HIGH (ref 80.0–100.0)
MCV: 101.7 fL — ABNORMAL HIGH (ref 80.0–100.0)
Platelets: 244 10*3/uL (ref 150–400)
Platelets: 235 10*3/uL (ref 150–400)
RBC: 3.4 MIL/uL — ABNORMAL LOW (ref 3.87–5.11)
RBC: 3.49 MIL/uL — ABNORMAL LOW (ref 3.87–5.11)
RDW: 19.7 % — ABNORMAL HIGH (ref 11.5–15.5)
RDW: 19.6 % — ABNORMAL HIGH (ref 11.5–15.5)
WBC: 5.4 10*3/uL (ref 4.0–10.5)
WBC: 6.6 10*3/uL (ref 4.0–10.5)
nRBC: 0.6 % — ABNORMAL HIGH (ref 0.0–0.2)
nRBC: 0.5 % — ABNORMAL HIGH (ref 0.0–0.2)

## 2021-04-15 LAB — BASIC METABOLIC PANEL
Anion gap: 3 — ABNORMAL LOW (ref 5–15)
Anion gap: 6 (ref 5–15)
Anion gap: 7 (ref 5–15)
Anion gap: 7 (ref 5–15)
BUN: 23 mg/dL (ref 8–23)
BUN: 20 mg/dL (ref 8–23)
BUN: 19 mg/dL (ref 8–23)
BUN: 20 mg/dL (ref 8–23)
CO2: 37 mmol/L — ABNORMAL HIGH (ref 22–32)
CO2: 36 mmol/L — ABNORMAL HIGH (ref 22–32)
CO2: 37 mmol/L — ABNORMAL HIGH (ref 22–32)
CO2: 32 mmol/L (ref 22–32)
Calcium: 8.3 mg/dL — ABNORMAL LOW (ref 8.9–10.3)
Calcium: 8.1 mg/dL — ABNORMAL LOW (ref 8.9–10.3)
Calcium: 8.5 mg/dL — ABNORMAL LOW (ref 8.9–10.3)
Calcium: 8.1 mg/dL — ABNORMAL LOW (ref 8.9–10.3)
Chloride: 114 mmol/L — ABNORMAL HIGH (ref 98–111)
Chloride: 110 mmol/L (ref 98–111)
Chloride: 107 mmol/L (ref 98–111)
Chloride: 105 mmol/L (ref 98–111)
Creatinine, Ser: 0.76 mg/dL (ref 0.44–1.00)
Creatinine, Ser: 0.6 mg/dL (ref 0.44–1.00)
Creatinine, Ser: 0.63 mg/dL (ref 0.44–1.00)
Creatinine, Ser: 0.71 mg/dL (ref 0.44–1.00)
GFR, Estimated: >60 mL/min (ref >60)
GFR, Estimated: >60 mL/min (ref >60)
GFR, Estimated: >60 mL/min (ref >60)
GFR, Estimated: >60 mL/min (ref >60)
Glucose, Bld: 116 mg/dL — ABNORMAL HIGH (ref 70–99)
Glucose, Bld: 95 mg/dL (ref 70–99)
Glucose, Bld: 104 mg/dL — ABNORMAL HIGH (ref 70–99)
Glucose, Bld: 130 mg/dL — ABNORMAL HIGH (ref 70–99)
Potassium: 4.4 mmol/L (ref 3.5–5.1)
Potassium: 3.5 mmol/L (ref 3.5–5.1)
Potassium: 3.3 mmol/L — ABNORMAL LOW (ref 3.5–5.1)
Potassium: 2.4 mmol/L — CL (ref 3.5–5.1)
Sodium: 154 mmol/L — ABNORMAL HIGH (ref 135–145)
Sodium: 152 mmol/L — ABNORMAL HIGH (ref 135–145)
Sodium: 151 mmol/L — ABNORMAL HIGH (ref 135–145)
Sodium: 144 mmol/L (ref 135–145)

## 2021-04-15 LAB — MAGNESIUM: Magnesium: 1.8 mg/dL (ref 1.7–2.4)

## 2021-04-15 LAB — URINE CULTURE: Culture: NO GROWTH

## 2021-04-15 LAB — VITAMIN B12: Vitamin B-12: 714 pg/mL (ref 180–914)

## 2021-04-15 LAB — SALICYLATE LEVEL: Salicylate Lvl: <7 mg/dL — ABNORMAL LOW (ref 7.0–30.0)

## 2021-04-15 LAB — IRON AND TIBC
Iron: 18 ug/dL — ABNORMAL LOW (ref 28–170)
Saturation Ratios: 8 % — ABNORMAL LOW (ref 10.4–31.8)
TIBC: 213 ug/dL — ABNORMAL LOW (ref 250–450)
UIBC: 195 ug/dL

## 2021-04-15 LAB — ACETAMINOPHEN LEVEL: Acetaminophen (Tylenol), Serum: <10 ug/mL — ABNORMAL LOW (ref 10–30)

## 2021-04-15 LAB — FERRITIN: Ferritin: 86 ng/mL (ref 11–307)

## 2021-04-15 LAB — FOLATE: Folate: 7.2 ng/mL (ref >5.9)

## 2021-04-15 MED ORDER — ALBUTEROL SULFATE (2.5 MG/3ML) 0.083% IN NEBU: 2.5000 mg | INHALATION_SOLUTION | RESPIRATORY_TRACT | Status: DC | PRN

## 2021-04-15 MED ORDER — SODIUM CHLORIDE 0.9 % IV BOLUS
500.0000 mL | Freq: Once | INTRAVENOUS | Status: AC
  Administered 2021-04-16: 500 mL via INTRAVENOUS

## 2021-04-15 MED ORDER — POTASSIUM CHLORIDE 10 MEQ/100ML IV SOLN
10.0000 meq | INTRAVENOUS | Status: AC
  Administered 2021-04-15 – 2021-04-16 (×4): 10 meq via INTRAVENOUS
  Filled 2021-04-15 (×4): qty 100

## 2021-04-15 MED ORDER — DEXTROSE 5 % IV BOLUS
1000.0000 mL | Freq: Once | INTRAVENOUS | Status: AC
  Administered 2021-04-15: 1000 mL via INTRAVENOUS

## 2021-04-15 MED ORDER — ORAL CARE MOUTH RINSE
15.0000 mL | Freq: Two times a day (BID) | OROMUCOSAL | Status: DC
  Administered 2021-04-15 – 2021-04-16 (×3): 15 mL via OROMUCOSAL

## 2021-04-15 MED ORDER — FOOD THICKENER (SIMPLYTHICK): 10.0000 | ORAL | Status: DC | PRN

## 2021-04-15 MED ORDER — DEXTROSE 5 % IV SOLN: INTRAVENOUS | Status: AC

## 2021-04-15 NOTE — Progress Notes (Signed)
Received critical lab result potassium--2.4. Text page on call doctor with new orders received. ?

## 2021-04-15 NOTE — Progress Notes (Signed)
SLP Cancellation Note ? ?Patient Details ?Name: Jennifer [name] ?MRN: [id number] ?DOB: 06-Nov-1922 ? ? ?Cancelled treatment:       Reason Eval/Treat Not Completed: Patient's level of consciousness. Patient asleep and unable to be aroused. SLP will plan to check in with nursing in PM to determine if any change in patient's alertness. Continue NPO status ? ? ?Angela Nevin, MA, [name] ?Speech Therapy ? ?

## 2021-04-15 NOTE — ED Notes (Signed)
CT at bedside to take pt for imaging per order, family refused, stating the pts mental status has been the same for a week now. MD aware.  ?

## 2021-04-15 NOTE — Evaluation (Signed)
Clinical/Bedside Swallow Evaluation ?Patient Details  ?Name: Jennifer [name] ?MRN: [id number] ?Date of Birth: 1922/06/19 ? ?Today's Date: 04/15/2021 ?Time: SLP Start Time (ACUTE ONLY): 1325 SLP Stop Time (ACUTE ONLY): 1345 ?SLP Time Calculation (min) (ACUTE ONLY): 20 min ? ?Past Medical History:  ?Past Medical History:  ?Diagnosis Date  ? Anemia   ? iron deficiency post op.  ? CAD (coronary artery disease) 2008  ? Cath 40% mid LAD, 35% RCA  ? Cellulitis of finger   ? right   ? Chest pain 2006  ? Chronic back pain   ? COPD (chronic obstructive pulmonary disease) (HCC)   ? Gastroesophageal reflux disease   ? Hiatal hernia   ? Hyperlipidemia   ? Memory deficit   ? Nicotine addiction   ? Osteoarthritis   ? status post left TKA; surgery on the right is anticipated in the near future   ? Pulmonary nodules   ? stable since 2006  ? Shingles   ? right breast   ? Skin infection   ? Ulcer   ? gential  ? Use of cane as ambulatory aid   ? ?Past Surgical History:  ?Past Surgical History:  ?Procedure Laterality Date  ? ABDOMINAL HYSTERECTOMY    ? BREAST SURGERY N/A   ? bilateral  ? CARDIAC CATHETERIZATION  2008  ? Mid LAD 40%, RCA 25%.   ? CHOLECYSTECTOMY    ? COMBINED HYSTERECTOMY ABDOMINAL W/ A&P REPAIR / OOPHORECTOMY  approx. 40 years ago   ? EYE SURGERY    ? MASTECTOMY  1998 & 2007   ? right -1998 / left 2007  ? right knee replacement  09/2009  ? Dr. Romeo Apple  ? TOTAL KNEE ARTHROPLASTY  4/08  ? left   ? ?HPI:  ?Patient is a 88 y.o. female with PMH: dementia, recurrent UTI's, COPD, HTN, ,h/o breast cancer s/p bilateral masectomy who presented from her SNF with one week of AMS. In ED, patient afebrile with normal HR and normal BP, oxygen saturations normal, CXR without focal consolidation or pleural effusion. She was admitted with suspected metabolic encephalopathy in the setting of significant hypernatremia. She was admitted approximately one month ago with encephalopathy in setting of hypernatremia and UTI.  ?  ?Assessment / Plan  / Recommendation  ?Clinical Impression ? Patient presents with clinical s/s of dysphagia as per this bedside/clinical swallow evaluation. She was able to hold cup and self feed with liquids and with thin liquids, SLP observed fairly consistent and immediate cough response which per daughter (in room during evaluation) is new as of today. Patient did not exhibit any overt s/s aspiration or penetration with nectar thick liquids via straw sips. When she was here approximately one month ago, SLP recommended Dys 1 thin liquids. At this time, SLP is recommending Dys 1 (puree) solids, nectar thick liquids. SLP will follow for toleration, ability to advance with liquids and/or solids, determination of need for objective swallow study.(MBS( ?SLP Visit Diagnosis: Dysphagia, unspecified (R13.10) ?   ?Aspiration Risk ? Mild aspiration risk;Moderate aspiration risk  ?  ?Diet Recommendation Dysphagia 1 (Puree);Nectar-thick liquid  ? ?Liquid Administration via: Cup;Straw ?Medication Administration: Crushed with puree ?Supervision: Patient able to self feed;Full supervision/cueing for compensatory strategies ?Compensations: Slow rate;Small sips/bites;Minimize environmental distractions ?Postural Changes: Seated upright at 90 degrees  ?  ?Other  Recommendations Oral Care Recommendations: Oral care BID;Staff/trained caregiver to provide oral care ?Other Recommendations: Order thickener from pharmacy;Prohibited food (jello, ice cream, thin soups)   ? ?Recommendations for follow up  therapy are one component of a multi-disciplinary discharge planning process, led by the attending physician.  Recommendations may be updated based on patient status, additional functional criteria and insurance authorization. ? ?Follow up Recommendations Skilled nursing-short term rehab (<3 hours/day)  ? ? ?  ?Assistance Recommended at Discharge Frequent or constant Supervision/Assistance  ?Functional Status Assessment Patient has had a recent decline in  their functional status and demonstrates the ability to make significant improvements in function in a reasonable and predictable amount of time.  ?Frequency and Duration min 2x/week  ?1 week ?  ?   ? ?Prognosis Prognosis for Safe Diet Advancement: Good  ? ?  ? ?Swallow Study   ?General Date of Onset: 04/14/21 ?HPI: Patient is a 88 y.o. female with PMH: dementia, recurrent UTI's, COPD, HTN, ,h/o breast cancer s/p bilateral masectomy who presented from her SNF with one week of AMS. In ED, patient afebrile with normal HR and normal BP, oxygen saturations normal, CXR without focal consolidation or pleural effusion. She was admitted with suspected metabolic encephalopathy in the setting of significant hypernatremia. She was admitted approximately one month ago with encephalopathy in setting of hypernatremia and UTI. ?Type of Study: Bedside Swallow Evaluation ?Previous Swallow Assessment: during recent previous admission ?Diet Prior to this Study: NPO ?Temperature Spikes Noted: No ?Respiratory Status: Room air ?History of Recent Intubation: No ?Behavior/Cognition: Alert;Cooperative;Pleasant mood ?Oral Cavity Assessment: Dry ?Oral Care Completed by SLP: Yes ?Oral Cavity - Dentition: Edentulous ?Self-Feeding Abilities: Able to feed self;Needs set up;Needs assist ?Patient Positioning: Upright in bed ?Baseline Vocal Quality: Other (comment) (minimal vocalizations/verbalizations produced) ?Volitional Cough: Cognitively unable to elicit ?Volitional Swallow: Unable to elicit  ?  ?Oral/Motor/Sensory Function Overall Oral Motor/Sensory Function: Within functional limits   ?Ice Chips     ?Thin Liquid Thin Liquid: Impaired ?Presentation: Straw;Self Fed ?Pharyngeal  Phase Impairments: Suspected delayed Swallow;Cough - Immediate  ?  ?Nectar Thick Nectar Thick Liquid: Within functional limits ?Presentation: Straw;Self Fed   ?Honey Thick     ?Puree Puree: Not tested   ?Solid ? ? ?  Solid: Not tested  ? ?  ? ?Angela Nevin, MA,  [name] ?Speech Therapy ? ? ? ?

## 2021-04-15 NOTE — Progress Notes (Signed)
Pt arrived from the ED comfortable in the bed, call bell in reach. This RN called the daughter to let her know the new room number ?

## 2021-04-15 NOTE — Progress Notes (Addendum)
? ?HD#0 ?SUBJECTIVE:  ?Patient Summary: Jennifer [name] is a 88 year old female with PMHx unspecified dementia since 2016, HTN, hx breast cancer s/p bilateral mastectomy, recurrent UTIs with multidrug resistant ESBL E. coli. 2020,  COPD presenting with altered mental status off baseline per family. hospitalized for hypernatremia secondary to dehydration. ? ?Overnight Events: no acute events overnight ? ?Interim History:  ?Patient's daughter refused transport to head imaging.  ? ?OBJECTIVE:  ?Vital Signs: ?Vitals:  ? 04/15/21 1200 04/15/21 1300 04/15/21 1409 04/15/21 1436  ?BP: (!) 142/78 137/74 (!) 145/60 114/64  ?Pulse: (!) 54 66 79 74  ?Resp: (!) 27 19 (!) 23 18  ?Temp:   98.6 ?F (37 ?C) (!) 97.4 ?F (36.3 ?C)  ?TempSrc:   Oral Oral  ?SpO2: 100% 98% 100% (!) 86%  ?Weight:      ?Height:      ? ?Supplemental O2:  ?SpO2: (!) 86 % ? ?Filed Weights  ? 04/14/21 1524  ?Weight: 60.7 kg  ? ? ?No intake or output data in the 24 hours ending 04/15/21 1911 ?Net IO Since Admission: No IO data has been entered for this period [04/15/21 1911] ? ?Physical Exam: ?Physical Exam ?Constitutional:   ?   Comments: Somnolent, does not awaken to verbal or tactile stimuli. Does move extremities freely  ?HENT:  ?   Head: Atraumatic.  ?Pulmonary:  ?   Effort: Pulmonary effort is normal.  ?   Breath sounds: Normal breath sounds.  ?Abdominal:  ?   General: Abdomen is flat. Bowel sounds are normal.  ?   Palpations: Abdomen is soft.  ?Musculoskeletal:     ?   General: No signs of injury. Normal range of motion.  ?Skin: ?   General: Skin is warm and dry.  ?   Findings: No bruising, erythema or rash.  ?   Comments: Healing sacral ulcer  ?Neurological:  ?   General: No focal deficit present.  ?   Comments: Moves upper extremities freely. Does not awaken to stimuli  ?Psychiatric:  ?   Comments: Unable to assess  ? ? ?Stage ? sacral ulcer present  ? ?Patient Lines/Drains/Airways Status   ? ? Active Line/Drains/Airways   ? ? Name Placement date  Placement time Site Days  ? Peripheral IV 03/15/21 22 G Left;Posterior Hand 03/15/21  --  Hand  31  ? Peripheral IV 04/14/21 22 G 1" Anterior;Right Hand 04/14/21  1940  Hand  1  ? Peripheral IV 04/15/21 22 G 1" Anterior;Left;Proximal Forearm 04/15/21  0001  Forearm  less than 1  ? External Urinary Catheter 03/16/21  2215  --  30  ? Pressure Injury 03/15/21 Sacrum Mid Stage 2 -  Partial thickness loss of dermis presenting as a shallow open injury with a red, pink wound bed without slough. 03/15/21  1331  -- 31  ? ?  ?  ? ?  ? ? ?Pertinent Labs: ? ?  Latest Ref Rng & Units 04/15/2021  ? 11:09 AM 04/15/2021  ?  2:30 AM 04/14/2021  ?  5:49 PM  ?CBC  ?WBC 4.0 - 10.5 K/uL 6.6   5.4   5.3    ?Hemoglobin 12.0 - 15.0 g/dL 10.3   9.7   10.0    ?Hematocrit 36.0 - 46.0 % 35.5   35.1   34.7    ?Platelets 150 - 400 K/uL 235   244   238    ? ? ? ?  Latest Ref Rng & Units 04/15/2021  ?  3:19 PM 04/15/2021  ? 11:09 AM 04/15/2021  ?  2:30 AM  ?CMP  ?Glucose 70 - 99 mg/dL 104   95   116    ?BUN 8 - 23 mg/dL 19   20   23     ?Creatinine 0.44 - 1.00 mg/dL 0.63   0.60   0.76    ?Sodium 135 - 145 mmol/L 151   152   154    ?Potassium 3.5 - 5.1 mmol/L 3.3   3.5   4.4    ?Chloride 98 - 111 mmol/L 107   110   114    ?CO2 22 - 32 mmol/L 37   36   37    ?Calcium 8.9 - 10.3 mg/dL 8.5   8.1   8.3    ? ? ?No results for input(s): GLUCAP in the last 72 hours.  ? ?Pertinent Imaging: ?No results found. ? ?ASSESSMENT/PLAN:  ?Assessment: ?Principal Problem: ?  Dementia ([place]) ?Active Problems: ?  Altered mental status ?  Hypernatremia ?  Dysphagia ? ? ?Jennifer [name] is a 87 y.o. with a pertinent PMH of unspecified dementia since 2016, HTN, hx breast cancer s/p bilateral mastectomy, recurrent UTIs with multidrug resistant ESBL E. coli. 2020,  COPD, who presented with altered mental status and admitted for acute encephalopathy likely secondary to hypernatremia in the setting of dehydration due to poor PO intake from dementia on hospital day 0.  ? ?Plan: ?#Altered  mental status ?#Hypernatremia ?#Dementia ?- We had initially wanted to obtain head CT of patient based off of morning evaluation. During that time, patient would not pen her eyes, follow commands, non-verbal. Due to AMS, bradycardia, and elevated BP, there was concern for increased ICP. Head scan was ordered, however, fFamily declined transport to CT scan stating that after our evaluation in the morning, patient demonstrated a return to baseline.   ?- Patient received 1L IV bolus and gentle fluids. Sodium was rechecked 3pm and found to be 151. Based off of this, her free water deficit is likely 2.1L. We will continue IVFs.  ?- continue to monitor BMP. Will need close monitoring of sodium q6 hrs ?- speech evaluated and recommended dysphagia 1 diet.  ?  ? ?#Hypokalemia ?- will continue to monitor and replete as necessary. ?  ?#Metabolic alkalosis ?Per daughter, patient has been experiencing diarrhea for for the past 6 months.  With at least 1 bout daily.  Since admission, patient had a total of 3 bouts of nonbloody diarrhea.  On previous admission 03/14/21, GI panel studies was obtained and negative for pathology.  Patient shows no sign of infection. Also, on prior admission, patient was treated for UTI with 3-day course of ceftriaxone.  Diarrhea unlikely due to antibiotic use.  Patient had experienced diarrhea prior to use.  And episodes have not worsened since.  On average 1-2 bouts per day.  Given current diet consisting of soft foods and Ensure.  Ensure is milk-based, patient could possibly have lactose intolerance resulting in chronic diarrhea.  Per daughter, patient previously could tolerate solid foods but due to the course of progression of dementia, patient developed dysphagia and in the past year have only tolerated soft foods/Ensure. Still Etiology unclear. Though metabolic alkalosis likely secondary to diarrhea.  Currently repleting electrolyte imbalance. ?--Hold lactulose containing Ensure; give  lactose-free products ?  ?Macrocytic anemia ?Interestingly, her folate and B12 are within normal limits, but she does have low iron. ?- hgb appears stable at this time. We will continue to  monitor and transfuse as necessary.  ? ? ?Signature: ?Delene Ruffini, MD ?Internal Medicine Resident, PGY-1 ?[facility]  ?Pager: [phone] ?7:11 PM, 04/15/2021  ? ?Please contact the on call pager after 5 pm and on weekends at [phone].  ?

## 2021-04-16 DIAGNOSIS — E870 Hyperosmolality and hypernatremia: Secondary | ICD-10-CM | POA: Diagnosis not present

## 2021-04-16 LAB — BASIC METABOLIC PANEL
Anion gap: 5 (ref 5–15)
Anion gap: 5 (ref 5–15)
BUN: 23 mg/dL (ref 8–23)
BUN: 19 mg/dL (ref 8–23)
CO2: 33 mmol/L — ABNORMAL HIGH (ref 22–32)
CO2: 34 mmol/L — ABNORMAL HIGH (ref 22–32)
Calcium: 7.7 mg/dL — ABNORMAL LOW (ref 8.9–10.3)
Calcium: 8 mg/dL — ABNORMAL LOW (ref 8.9–10.3)
Chloride: 103 mmol/L (ref 98–111)
Chloride: 103 mmol/L (ref 98–111)
Creatinine, Ser: 0.66 mg/dL (ref 0.44–1.00)
Creatinine, Ser: 0.57 mg/dL (ref 0.44–1.00)
GFR, Estimated: >60 mL/min (ref >60)
GFR, Estimated: >60 mL/min (ref >60)
Glucose, Bld: 87 mg/dL (ref 70–99)
Glucose, Bld: 85 mg/dL (ref 70–99)
Potassium: 4.2 mmol/L (ref 3.5–5.1)
Potassium: 3.5 mmol/L (ref 3.5–5.1)
Sodium: 141 mmol/L (ref 135–145)
Sodium: 142 mmol/L (ref 135–145)

## 2021-04-16 LAB — MRSA NEXT GEN BY PCR, NASAL: MRSA by PCR Next Gen: NOT DETECTED

## 2021-04-16 NOTE — TOC Transition Note (Signed)
Transition of Care (TOC) - CM/SW Discharge Note ? ? ?Patient Details  ?Name: Jennifer [name] ?MRN: [id number] ?Date of Birth: May 08, 1922 ? ?Transition of Care (TOC) CM/SW Contact:  ?Jimmy Picket, LCSW ?Phone Number: ?04/16/2021, 3:24 PM ? ? ?Clinical Narrative:    ? ?Per MD patient ready for DC to [facility]. RN, patient, patient's family, and facility notified of DC. Discharge Summary and FL2 sent to facility. DC packet on chart. Insurance Berkley Harvey has been received. Ambulance transport requested for patient.  ?  ?RN to call report to [phone]. Pt will go to room 42A. ? ?CSW will sign off for now as social work intervention is no longer needed. Please consult [place] again if new needs arise. ? ? ?Final next level of care: Skilled Nursing Facility ?Barriers to Discharge: No Barriers Identified ? ? ?Patient Goals and CMS Choice ?  ?  ?  ? ?Discharge Placement ?  ?           ?Patient chooses bed at: [facility] ?Patient to be transferred to facility by: [facility] ?Name of family member notified: Daughter, [name] ?Patient and family notified of of transfer: 04/16/21 ? ?Discharge Plan and Services ?  ?  ?           ?  ?  ?  ?  ?  ?  ?  ?  ?  ?  ? ?Social Determinants of Health (SDOH) Interventions ?  ? ? ?Readmission Risk Interventions ?   ? View : No data to display.  ?  ?  ?  ? ?Jimmy Picket, LCSW ?Clinical Social Worker ? ? ? ? ?

## 2021-04-16 NOTE — Discharge Summary (Addendum)
? ?Name: Jennifer [name] ?MRN: [id number] ?DOB: 1922-10-09 88 y.o. ?PCP: Pcp, No ? ?Date of Admission: 04/14/2021  2:57 PM ?Date of Discharge: 04/16/21 ? ?Attending Physician: Dr.  Saverio Danker ? ?Discharge Diagnosis: ?Principal Problem: ?  Dementia ([place]) ?Active Problems: ?  Altered mental status ?  Hypernatremia ?  Dysphagia ?  ? ?Discharge Medications: ?Allergies as of 04/16/2021   ? ?   Reactions  ? Aricept [donepezil Hcl] Other (See Comments)  ? confusion  ? Iohexol   ?  Desc: PT ALLERGIC TO CONTRAST- SHE CAN'T BREATH  ? Iron Other (See Comments)  ? unknown  ? Penicillins   ? Has patient had a PCN reaction causing immediate rash, facial/tongue/throat swelling, SOB or lightheadedness with hypotension:  unknown ?Has patient had a PCN reaction causing severe rash involving mucus membranes or skin necrosis: unknown ?Has patient had a PCN reaction that required hospitalization unknown ?Has patient had a PCN reaction occurring within the last 10 years: unknown ?If all of the above answers are "NO", then may proceed with Cephalosporin use.  ? ?  ? ?  ?Medication List  ?  ? ?STOP taking these medications   ? ?cefTRIAXone 1 g injection ?Commonly known as: ROCEPHIN ?  ?furosemide 20 MG tablet ?Commonly known as: LASIX ?  ?OXYGEN ?  ? ?  ? ?TAKE these medications   ? ?acetaminophen 325 MG tablet ?Commonly known as: TYLENOL ?Take 650 mg by mouth 3 (three) times daily. ?What changed: Another medication with the same name was removed. Continue taking this medication, and follow the directions you see here. ?  ?albuterol (2.5 MG/3ML) 0.083% nebulizer solution ?Commonly known as: PROVENTIL ?Take 3 mLs (2.5 mg total) by nebulization every 4 (four) hours as needed for wheezing or shortness of breath. ?  ?Cranberry 500 MG Tabs ?Take 500 mg by mouth daily. ?  ?Debrox 6.5 % OTIC solution ?Generic drug: carbamide peroxide ?Place 10 drops into both ears See admin instructions. Twice a day on the 4th, 5th, 6th,7th of every 3rd month ?  ?dextrose 5  % and 0.9% NaCl 5-0.9 % infusion ?Inject 50 mL/hr into the vein continuous. 50 ml/hr x 1 L every shift ?  ?diclofenac sodium 1 % Gel ?Commonly known as: VOLTAREN ?Apply to both knees QID PRN ?What changed:  ?how much to take ?how to take this ?when to take this ?reasons to take this ?additional instructions ?  ?feeding supplement (PRO-STAT SUGAR FREE 64) Liqd ?Take 30 mLs by mouth in the morning and at bedtime. ?  ?FORTIFY PROBIOTIC WOMENS PO ?Take 1 capsule by mouth daily. ?  ?geriatric multivitamins-minerals Liqd ?Take 15 mLs by mouth daily. ?  ?guaifenesin 400 MG Tabs tablet ?Commonly known as: HUMIBID E ?Take 400 mg by mouth every 6 (six) hours. ?  ?Imvexxy Maintenance Pack 4 MCG Inst ?Generic drug: Estradiol ?Place 4 mcg vaginally 2 (two) times a week. Monday's and Thursday's ?  ?ipratropium-albuterol 0.5-2.5 (3) MG/3ML Soln ?Commonly known as: DUONEB ?Take 3 mLs by nebulization every 6 (six) hours. ?  ?lidocaine 5 % ?Commonly known as: LIDODERM ?Place 1 patch onto the skin daily. Remove & Discard patch within 12 hours or as directed by MD ?What changed: additional instructions ?  ?Olopatadine HCl 0.2 % Soln ?Place 1 drop into both eyes daily. ?  ?OVER THE COUNTER MEDICATION ?Take 1 Container by mouth every evening. Magic Cup ?  ?oxybutynin 5 MG 24 hr tablet ?Commonly known as: DITROPAN-XL ?Take 5 mg by mouth at bedtime. ?  ?  polyvinyl alcohol 1.4 % ophthalmic solution ?Commonly known as: LIQUIFILM TEARS ?Place 1 drop into both eyes 2 (two) times daily. ?  ?traMADol 50 MG tablet ?Commonly known as: ULTRAM ?Take 50 mg by mouth every 6 (six) hours as needed for moderate pain. ?What changed: Another medication with the same name was removed. Continue taking this medication, and follow the directions you see here. ?  ?Vitamin D3 50 MCG (2000 UT) Tabs ?Take 2,000 Units by mouth daily. ?  ?zinc oxide 20 % ointment ?Apply 1 application topically in the morning, at noon, and at bedtime. To sacrum and buttocks ?  ? ?   ? ?  ?  ? ? ?  ?Discharge Care Instructions  ?(From admission, onward)  ?  ? ? ?  ? ?  Start     Ordered  ? 04/16/21 0000  Leave dressing on - Keep it clean, dry, and intact until clinic visit       ? 04/16/21 1522  ? ?  ?  ? ?  ? ?Disposition and follow-up:   ?Jennifer [name] was discharged from [facility] in Stable condition.  At the hospital follow up visit please address: ? ?1.  Follow-up: ? a. Electrolyte abnormalities - follow up with repeat BMP. Encourage PO intake, avoid Lasix given poor p.o. intake. ?  ? b. Diarrhea - Give lactulose free products only ? ? c. Macrocytic Anemia - follow up cbc, consider iron supplementation ? ? d. Advanced Dementia - Follow up PCP and continue family discussions and palliative care management.  ? ?2.  Labs / imaging needed at time of follow-up: CBC, BMP ? ?3.  Pending labs/ test needing follow-up: None ? ?4.  Medication Changes ? Ceftriaxone discontinued off of medication list. This was not continued on admission. Not requiring O2 therapy here, oxygen order discontinued.   ?Lasix was on medication list as well which we have discontinued in the setting of dehydration. ? ?Follow-up Appointments: ?  PCP ? ?Hospital Course by problem list: ?Acute encephalopathy secondary to hypernatremia in the setting of poor p.o. intake with underlying dementia.  Patient presented with complaints of somnolence, sleeping more than baseline appearing warm flushed.  During evaluation in the emergency department patient was noted to be afebrile with normal heart rate and blood pressure.  She was saturating well on room air.  EKG was obtained which showed sinus bradycardia and APCs.  Given these findings as well as hypotension there was concern for possible increased intracranial pressure, CT scan was ordered however family declined. Bradycardia and BP improved with improvement in mental status. Chest x-ray obtained in the emergency department also showed  no focal  consolidation or pleural effusion.  Labs were most significant for hypernatremia up to sodium of 155 with mild hypokalemia, elevated bicarb and macrocytic anemia.  Notably no leukocytosis, and UA was not consistent with infection.  Her ceftriaxone that was initially started at her facility was not continued.  Lasix which she had been receiving prior to admission was also not continued.  She was started on D5W with potassium repletion.  Sodium levels were trended with improvement to normal range.  During our evaluation in the morning after admission she was not opening her eyes and did not respond with the purpose to tactile or verbal stimuli.  However sometime after our morning evaluation, patient's daughter reported that she appeared to be back at her baseline.  She remained stable without evidence of ongoing infection, no fever.  She  was also hemodynamically stable.  During follow-up evaluation the next day patient appeared to be doing much better she was awake responsive to questioning though still minimal verbalization.  However she also had demonstrated good oral intake and was drinking fluids on her own.  Daughter reported she was back to her normal self.  Deemed stable for discharge back to her prior facility.  Her sacral ulcer has healed. ? ?Discharge Subjective: ?Patient doing significantly better awake alert and eating breakfast.  History provided by daughter but daughter reports that patient is back to her baseline. ? ?Discharge Exam:   ?BP 100/81 (BP Location: Left Arm)   Pulse 95   Temp 98.5 ?F (36.9 ?C)   Resp 18   Ht 5\' 6"  (1.676 m)   Wt 60.7 kg   SpO2 (!) 89%   BMI 21.60 kg/m?  ?Constitutional: Elderly appearing female no acute distress. ?HENT: normocephalic atraumatic, mucous membranes moist edentulous ?Eyes: conjunctiva non-erythematous ?Neck: supple ?Cardiovascular: regular rate and rhythm, no m/r/g ?Pulmonary/Chest: normal work of breathing on room air, lungs clear to auscultation  bilaterally ?Abdominal: soft, non-tender, non-distended ?MSK: normal bulk and tone ?Neurological: alert, moving extremities ?Skin: warm and dry ?Psych: Unable to assess, demented ? ?Pertinent Labs, Studies, and Procedures:  ? ?  L

## 2021-04-16 NOTE — Progress Notes (Signed)
RN called [facility] and gave report to [facility]. IV has been removed, PT[facility] has been called. ?

## 2021-04-16 NOTE — Progress Notes (Signed)
Paged from RN regarding pt BP 85/52 MAP 63. Assessed on both arms. Patient was afebrile with normal HR. IMTS came to bedside and patient was not in any acute distress. Pt was alert. Physical exam benign. ? ?PLAN: ?IV NaCl bolus 500cc ?Repeat BP after gentle hydration ?

## 2021-04-16 NOTE — Progress Notes (Signed)
[facility] here to transfer pt to [facility]. ?

## 2021-04-16 NOTE — Progress Notes (Signed)
Speech Language Pathology Treatment: Dysphagia  ?Patient Details ?Name: Jennifer [name] ?MRN: [id number] ?DOB: 05-Feb-1922 ?Today's Date: 04/16/2021 ?Time: [id number] ?SLP Time Calculation (min) (ACUTE ONLY): 35 min ? ?Assessment / Plan / Recommendation ?Clinical Impression ? Patient seen by SLP for skilled treatment session focused on dysphagia goals. Patient as awake and alert when SLP entered room. Breakfast tray in room but had not yet been started. SLP provided setup of meal tray and total assist for feeding patient pureed waffle and cream of wheat. She was able to hold cup and drink liquids via straw sips with setup assistance only. Mildly prolonged oral phase with puree solids and suspect swallow initiation delay with puree solids and nectar thick liquids. SLP did observe patient with trial of thin liquids (juice and water). Initially patient appeared to tolerate however she then started exhibiting delayed cough progressing to more immediate cough response as she did during initial evaluation on previous date. No coughing, throat clearing or other overt s/s that could be indicative of penetration/aspiration were observed with straw sips of nectar thick liquids. SLP is recommending to continue with Dys 1 solids, nectar thick liquids at this time. SLP to follow for ability to upgrade liquids and determine need for objective swallow study. ? ?  ?HPI HPI: Patient is a 88 y.o. female with PMH: dementia, recurrent UTI's, COPD, HTN, ,h/o breast cancer s/p bilateral masectomy who presented from her SNF with one week of AMS. In ED, patient afebrile with normal HR and normal BP, oxygen saturations normal, CXR without focal consolidation or pleural effusion. She was admitted with suspected metabolic encephalopathy in the setting of significant hypernatremia. She was admitted approximately one month ago with encephalopathy in setting of hypernatremia and UTI. ?  ?   ?SLP Plan ? Continue with current plan of care ? ?  ?   ?Recommendations for follow up therapy are one component of a multi-disciplinary discharge planning process, led by the attending physician.  Recommendations may be updated based on patient status, additional functional criteria and insurance authorization. ?  ? ?Recommendations  ?Diet recommendations: Dysphagia 1 (puree);Nectar-thick liquid ?Liquids provided via: Cup;Straw ?Medication Administration: Crushed with puree ?Supervision: Full supervision/cueing for compensatory strategies;Staff to assist with self feeding ?Compensations: Slow rate;Small sips/bites;Minimize environmental distractions ?Postural Changes and/or Swallow Maneuvers: Seated upright 90 degrees  ?   ?    ?   ? ? ? ? Oral Care Recommendations: Oral care BID;Staff/trained caregiver to provide oral care ?Follow Up Recommendations: Skilled nursing-short term rehab (<3 hours/day) ?Assistance recommended at discharge: Frequent or constant Supervision/Assistance ?SLP Visit Diagnosis: Dysphagia, unspecified (R13.10) ?Plan: Continue with current plan of care ? ? ? ? ?  ?  ? ? ?Sonia Baller, MA, [name] ?Speech Therapy ? ?

## 2021-05-12 DEATH — deceased

## 2021-10-11 NOTE — Progress Notes (Signed)
No chief complaint on file.
